# Patient Record
Sex: Female | Born: 1945 | Race: White | Hispanic: No | Smoking: Former smoker
Health system: Southern US, Community
[De-identification: ages and names within clinical notes are randomized; demographics above are authoritative.]

## PROBLEM LIST (undated history)

## (undated) DIAGNOSIS — E119 Type 2 diabetes mellitus without complications: Secondary | ICD-10-CM

## (undated) DIAGNOSIS — K219 Gastro-esophageal reflux disease without esophagitis: Secondary | ICD-10-CM

## (undated) DIAGNOSIS — N6019 Diffuse cystic mastopathy of unspecified breast: Secondary | ICD-10-CM

## (undated) DIAGNOSIS — I491 Atrial premature depolarization: Secondary | ICD-10-CM

## (undated) DIAGNOSIS — N189 Chronic kidney disease, unspecified: Secondary | ICD-10-CM

## (undated) DIAGNOSIS — J439 Emphysema, unspecified: Secondary | ICD-10-CM

## (undated) DIAGNOSIS — Z9289 Personal history of other medical treatment: Secondary | ICD-10-CM

## (undated) DIAGNOSIS — I10 Essential (primary) hypertension: Secondary | ICD-10-CM

## (undated) DIAGNOSIS — R002 Palpitations: Secondary | ICD-10-CM

## (undated) DIAGNOSIS — I499 Cardiac arrhythmia, unspecified: Secondary | ICD-10-CM

## (undated) DIAGNOSIS — J309 Allergic rhinitis, unspecified: Secondary | ICD-10-CM

## (undated) DIAGNOSIS — Z87891 Personal history of nicotine dependence: Secondary | ICD-10-CM

## (undated) DIAGNOSIS — M1990 Unspecified osteoarthritis, unspecified site: Secondary | ICD-10-CM

## (undated) DIAGNOSIS — L409 Psoriasis, unspecified: Secondary | ICD-10-CM

## (undated) DIAGNOSIS — R0600 Dyspnea, unspecified: Secondary | ICD-10-CM

## (undated) DIAGNOSIS — E7800 Pure hypercholesterolemia, unspecified: Secondary | ICD-10-CM

## (undated) DIAGNOSIS — R011 Cardiac murmur, unspecified: Secondary | ICD-10-CM

## (undated) HISTORY — DX: Atrial premature depolarization: I49.1

## (undated) HISTORY — DX: Emphysema, unspecified: J43.9

## (undated) HISTORY — PX: DILATION AND CURETTAGE OF UTERUS: SHX78

## (undated) HISTORY — PX: CHOLECYSTECTOMY: SHX55

## (undated) HISTORY — PX: BACK SURGERY: SHX140

## (undated) HISTORY — DX: Personal history of nicotine dependence: Z87.891

## (undated) HISTORY — PX: OTHER SURGICAL HISTORY: SHX169

## (undated) HISTORY — PX: CERVICAL SPINE SURGERY: SHX589

## (undated) HISTORY — PX: SKIN GRAFT: SHX250

## (undated) HISTORY — DX: Type 2 diabetes mellitus without complications: E11.9

## (undated) HISTORY — DX: Allergic rhinitis, unspecified: J30.9

## (undated) HISTORY — DX: Essential (primary) hypertension: I10

## (undated) HISTORY — DX: Palpitations: R00.2

## (undated) HISTORY — PX: SPINAL CORD STIMULATOR BATTERY EXCHANGE: SHX6202

## (undated) HISTORY — DX: Pure hypercholesterolemia, unspecified: E78.00

## (undated) HISTORY — DX: Personal history of other medical treatment: Z92.89

## (undated) HISTORY — PX: MASTECTOMY: SHX3

## (undated) HISTORY — DX: Psoriasis, unspecified: L40.9

## (undated) HISTORY — DX: Gastro-esophageal reflux disease without esophagitis: K21.9

## (undated) HISTORY — PX: ABDOMINAL HYSTERECTOMY: SHX81

---

## 2005-01-09 ENCOUNTER — Inpatient Hospital Stay: Payer: Self-pay | Admitting: Family Medicine

## 2005-01-26 ENCOUNTER — Ambulatory Visit: Payer: Self-pay | Admitting: Family Medicine

## 2005-02-18 ENCOUNTER — Ambulatory Visit: Payer: Self-pay | Admitting: General Surgery

## 2005-02-25 ENCOUNTER — Ambulatory Visit: Payer: Self-pay | Admitting: General Surgery

## 2005-04-22 ENCOUNTER — Ambulatory Visit: Payer: Self-pay | Admitting: Family Medicine

## 2005-08-27 ENCOUNTER — Ambulatory Visit: Payer: Self-pay

## 2005-09-16 ENCOUNTER — Ambulatory Visit: Payer: Self-pay | Admitting: Specialist

## 2005-09-18 ENCOUNTER — Ambulatory Visit: Payer: Self-pay | Admitting: Gastroenterology

## 2006-06-01 ENCOUNTER — Encounter: Payer: Self-pay | Admitting: Family Medicine

## 2006-06-19 ENCOUNTER — Encounter: Payer: Self-pay | Admitting: Family Medicine

## 2006-07-20 ENCOUNTER — Encounter: Payer: Self-pay | Admitting: Family Medicine

## 2006-08-19 ENCOUNTER — Encounter: Payer: Self-pay | Admitting: Family Medicine

## 2006-09-19 ENCOUNTER — Encounter: Payer: Self-pay | Admitting: Family Medicine

## 2006-12-23 ENCOUNTER — Ambulatory Visit: Payer: Self-pay | Admitting: Family Medicine

## 2007-08-17 ENCOUNTER — Ambulatory Visit: Payer: Self-pay | Admitting: Family Medicine

## 2007-12-02 IMAGING — US ABDOMEN ULTRASOUND
1 series · 17 of 25 positions shown · non-contrast
Comparison: none

REASON FOR EXAM: epigastric pain mid abdominal palpable mass pt can feel
COMMENTS:

[Series 1: abdomen ultrasound · 17 of 39 slices shown]
[im 1/39]
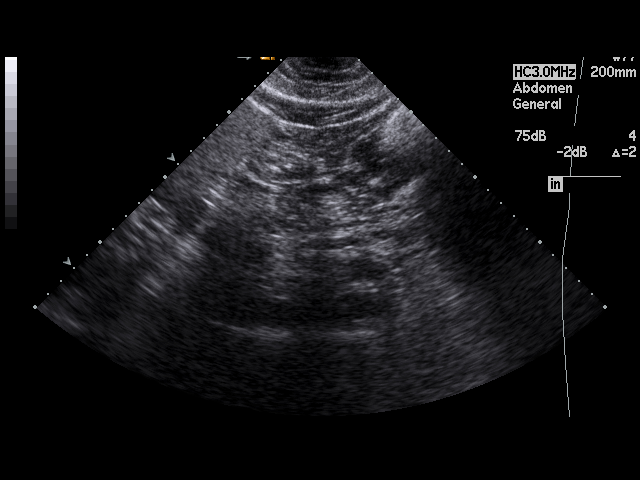
[im 4/39]
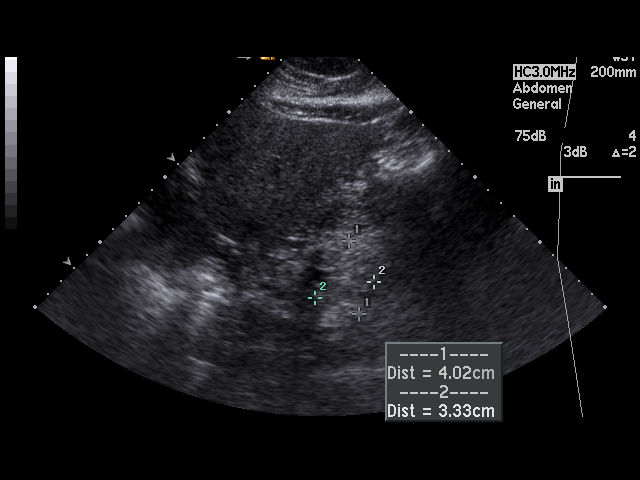
[im 5/39]
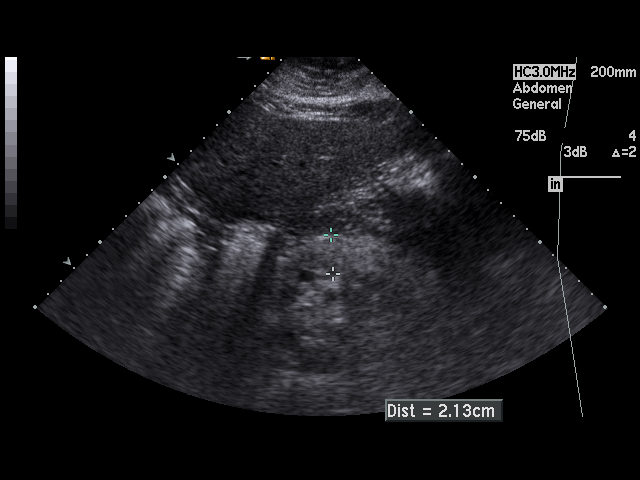
[im 8/39]
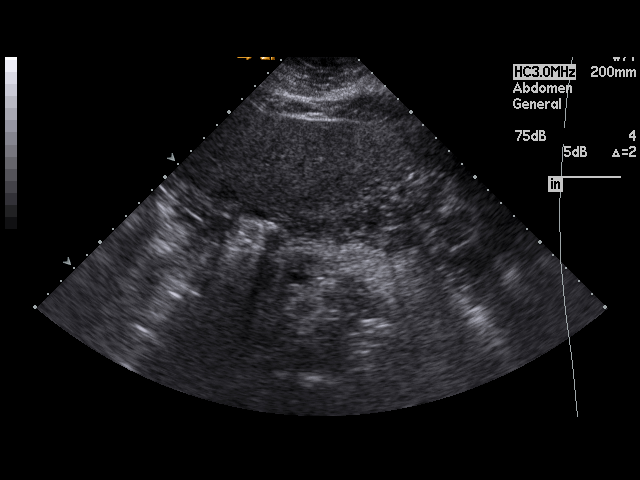
[im 10/39]
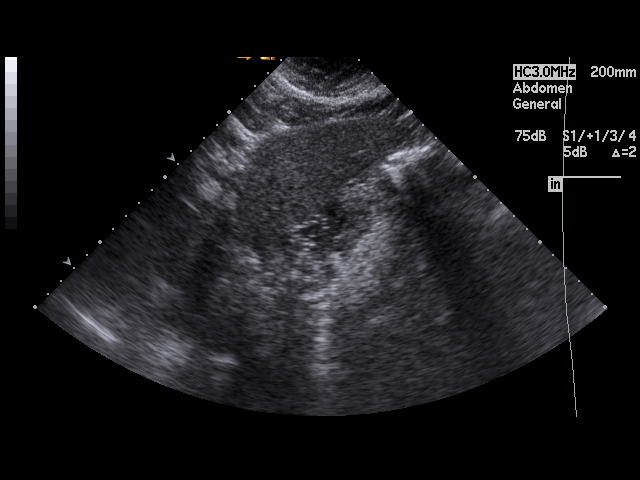
[im 13/39]
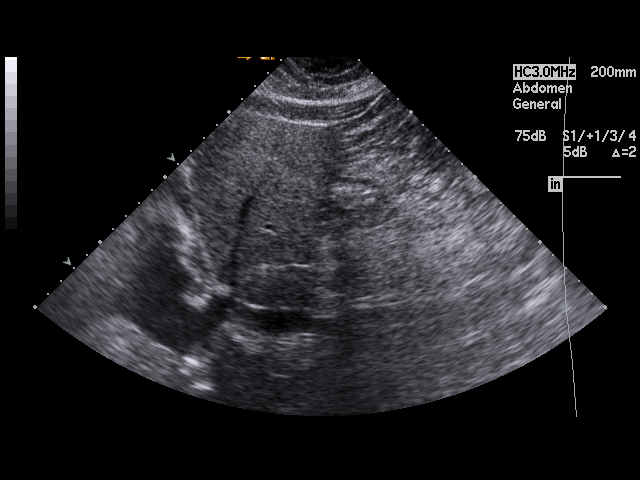
[im 15/39]
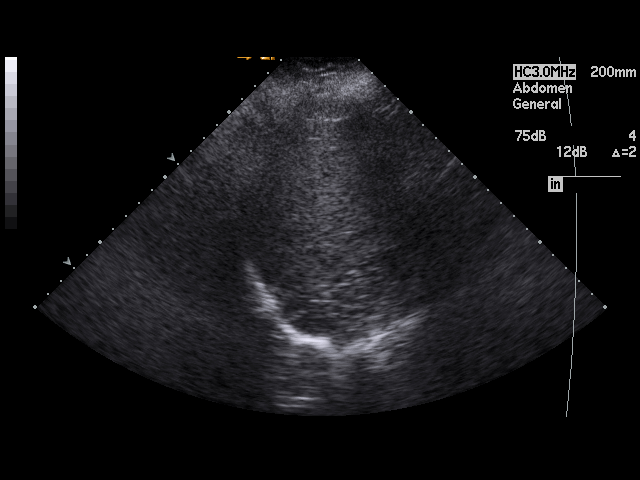
[im 18/39]
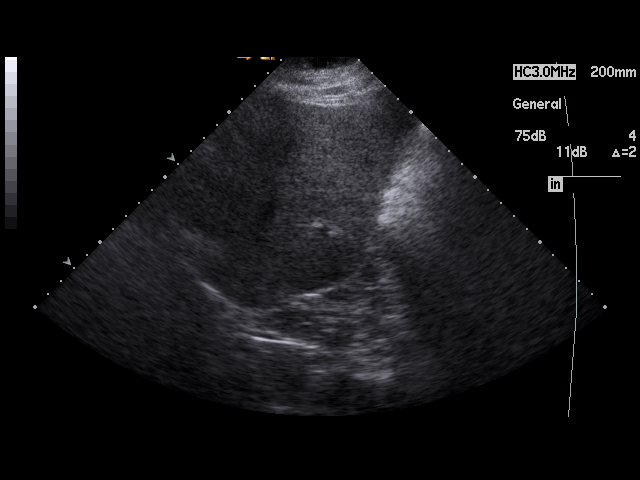
[im 20/39]
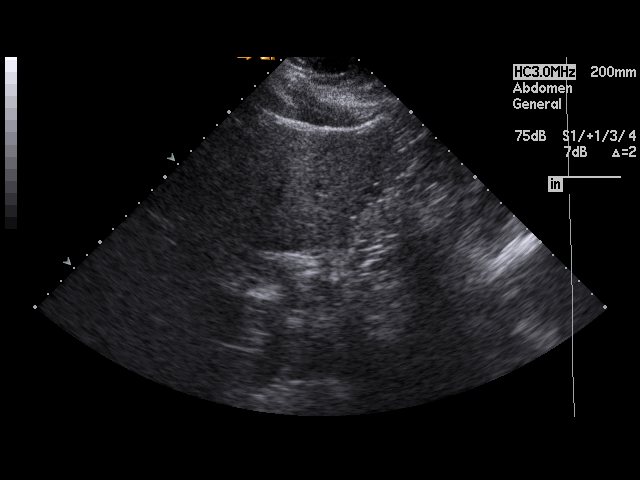
[im 21/39]
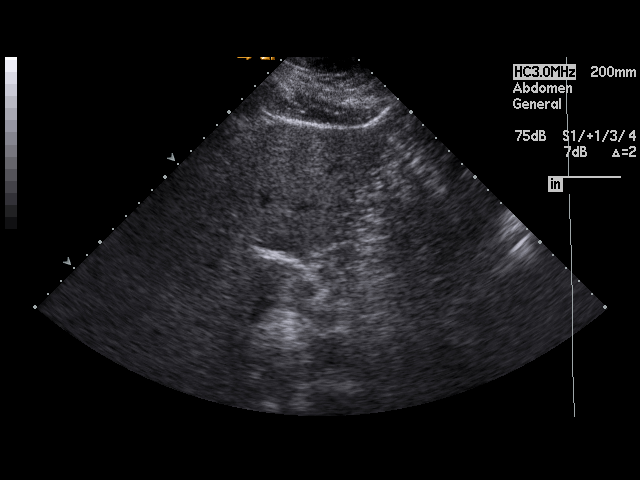
[im 24/39]
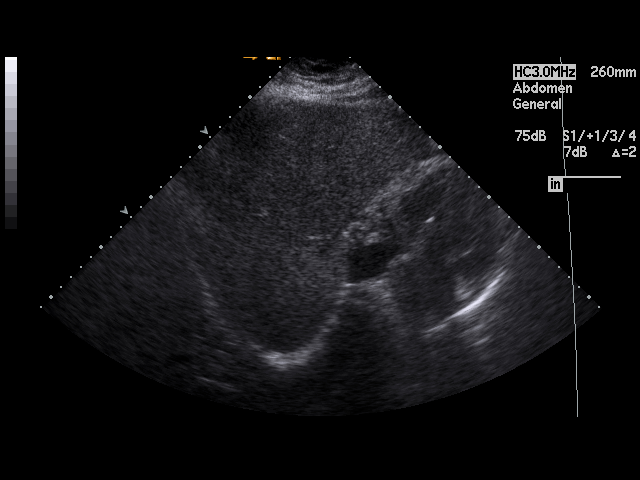
[im 26/39]
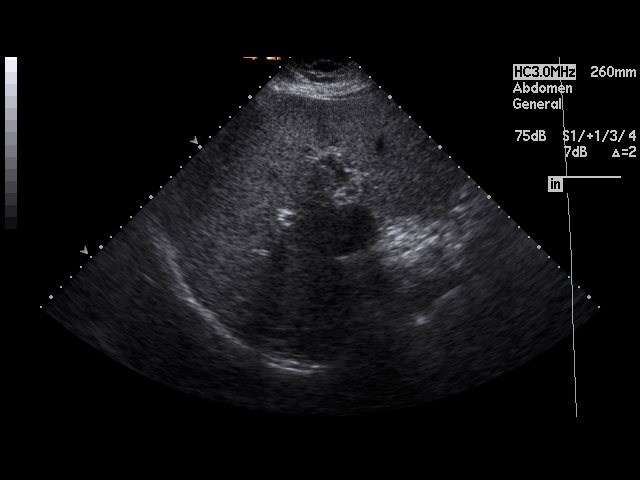
[im 29/39]
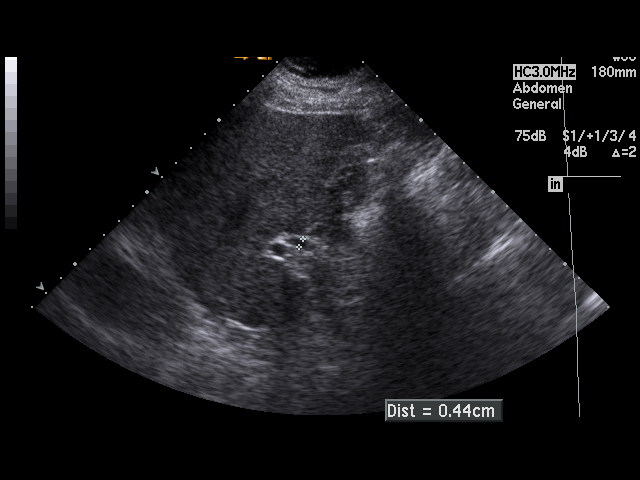
[im 31/39]
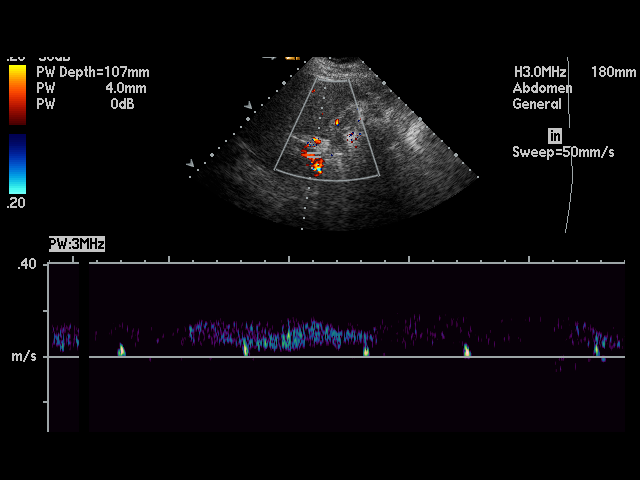
[im 34/39]
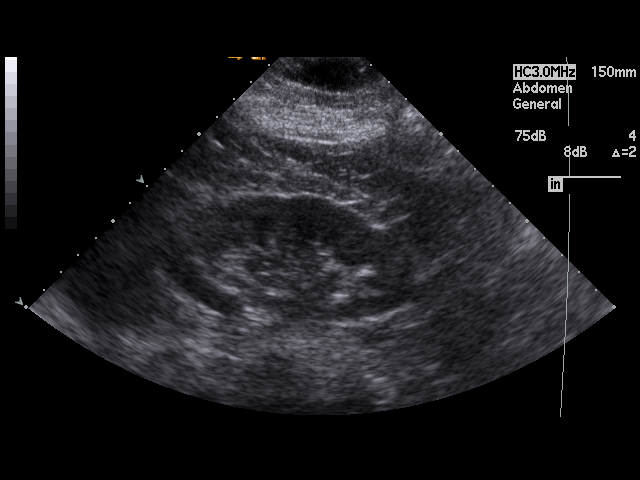
[im 35/39]
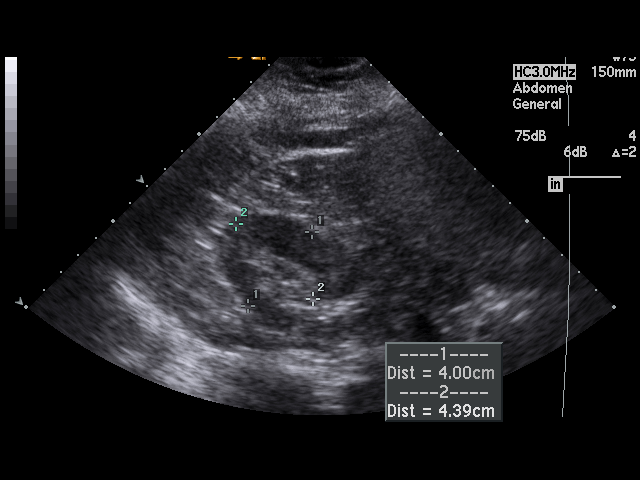
[im 39/39]
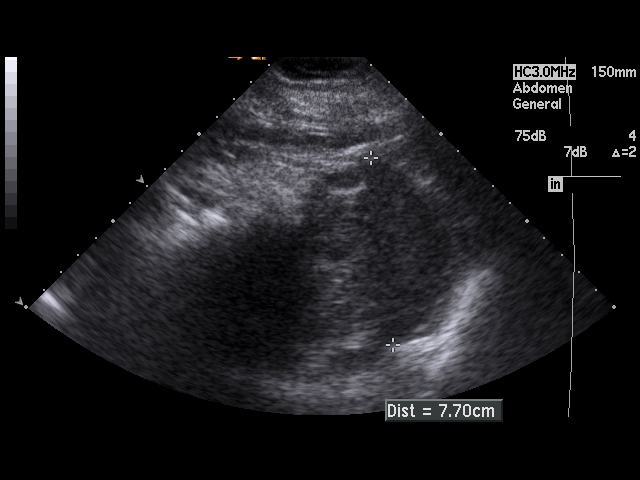

[17 of 25 positions shown; findings below may reference images not displayed]

PROCEDURE:     US  - US ABDOMEN GENERAL SURVEY  - August 27, 2005  [DATE]

RESULT:     The liver and spleen are normal in appearance. The pancreas is
plump, particularly the pancreatic head, which measures approximately
cm in AP diameter.  This value is similar to that observed on a prior
ultrasound exam of 01-12-05.  Recurrent pancreatitis would be the primary
consideration.  Additional evaluation by CT might be helpful if clinically
warranted. No pseudocyst formation is seen. No ascites is identified. The
patient is status post cholecystectomy.  The common bile duct measures
mm in diameter, which is within normal limits.  The kidneys show no
hydronephrosis. The abdominal aorta is normal in appearance.
IMPRESSION: 1)There is prominence of the pancreas, particularly the pancreatic head.
Similar findings were present on a prior exam of 01-12-05.  Recurrent
pancreatitis would be the first consideration, but conceivably, a pancreatic
head mass cannot be ruled out and additional evaluation by CT might be
helpful if such is clinically indicated.

2)There is no ascites.

3)The patient is status post cholecystectomy.

## 2009-06-24 ENCOUNTER — Ambulatory Visit: Payer: Self-pay | Admitting: Family Medicine

## 2009-07-16 ENCOUNTER — Ambulatory Visit: Payer: Self-pay | Admitting: Specialist

## 2010-02-11 ENCOUNTER — Ambulatory Visit: Payer: Self-pay | Admitting: Family Medicine

## 2010-04-23 ENCOUNTER — Other Ambulatory Visit: Payer: Self-pay | Admitting: Internal Medicine

## 2010-04-24 ENCOUNTER — Ambulatory Visit: Payer: Self-pay | Admitting: Internal Medicine

## 2013-03-30 ENCOUNTER — Ambulatory Visit: Payer: Self-pay | Admitting: Ophthalmology

## 2013-03-30 LAB — POTASSIUM: Potassium: 4.4 mmol/L

## 2013-04-03 ENCOUNTER — Ambulatory Visit: Payer: Self-pay | Admitting: Ophthalmology

## 2013-04-24 ENCOUNTER — Ambulatory Visit: Payer: Self-pay | Admitting: Ophthalmology

## 2014-06-28 ENCOUNTER — Encounter (INDEPENDENT_AMBULATORY_CARE_PROVIDER_SITE_OTHER): Payer: Self-pay

## 2014-06-28 ENCOUNTER — Encounter: Payer: Self-pay | Admitting: Internal Medicine

## 2014-06-28 ENCOUNTER — Ambulatory Visit (INDEPENDENT_AMBULATORY_CARE_PROVIDER_SITE_OTHER): Payer: Medicare Other | Admitting: Internal Medicine

## 2014-06-28 VITALS — BP 146/80 | HR 97 | Temp 98.7°F | Ht 65.0 in | Wt 232.0 lb

## 2014-06-28 DIAGNOSIS — J449 Chronic obstructive pulmonary disease, unspecified: Secondary | ICD-10-CM | POA: Insufficient documentation

## 2014-06-28 DIAGNOSIS — J9611 Chronic respiratory failure with hypoxia: Secondary | ICD-10-CM | POA: Diagnosis not present

## 2014-06-28 DIAGNOSIS — J439 Emphysema, unspecified: Secondary | ICD-10-CM | POA: Insufficient documentation

## 2014-06-28 DIAGNOSIS — J9610 Chronic respiratory failure, unspecified whether with hypoxia or hypercapnia: Secondary | ICD-10-CM | POA: Insufficient documentation

## 2014-06-28 NOTE — Patient Instructions (Addendum)
Follow up with Dr. Dema SeverinMungal in 1 month - pulmonary function testing and 6 minute walk test prior to follow up - 2 view chest xray for COPD and shortness of breath - continue with 3L oxygen at all times, Spiriva, Pulmicort nebulizer and rescue inhaler  - continue with exercise as tolerated.

## 2014-06-28 NOTE — Progress Notes (Signed)
Date: 06/28/2014  MRN# [id number] Julie Keith 11-Nov-1945  Referring Physician: Dr. Magdalene Keith  Julie Keith is a 69 y.o. old female seen in consultation for  COPD management.   CC:  Chief Complaint  Patient presents with  . Advice Only    Pt referred by Dr. Thana Keith: Pt is is on 3 L Pulse x 7 yrs.    HPI:  Patient is a pleasant 69 year old female presents today for COPD management transitioning care from her prior pulmonologist, Dr. Mayo Keith. Patient states she was diagnosed with COPD 9 years ago. She usually has 1 bronchitis episode per year, that happened winter or spring, whether changes definitely exacerbate her COPD. Current medications include: 3 L oxygen pulse dose, Spiriva, Xopenex inhaler as a rescue meds, albuterol nebulizer as a rescue med, Pulmicort nebulizer twice a day. Patient was a former smoker, quit in 2007, previously smoked 2.5 packs per day for 25 years. She does have a pet to get home. 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 Past medical history of COPD, psoriasis, diabetes, hypertension, chronic lower extremity edema. Typical COPD symptoms include dyspnea on exertion,mild increase in sputum production. Symptoms are exacerbated by weather changes, relieved with inhaler use a nebulizer use along with supplemental oxygen. Last bronchitis episode was in November 2015. Currently patient states that her breathing is at baseline.   PMHX:   Past Medical History  Diagnosis Date  . Hypertension   . Diabetes   . Hypercholesteremia   . Emphysema lung   . Allergic rhinitis    Surgical Hx:  Past Surgical History  Procedure Laterality Date  . Cholecystectomy    . Cervical spine surgery    . Abdominal hysterectomy    . Mastectomy Bilateral    Family Hx:  Family History  Problem Relation Age of Onset  . Leukemia Mother   . Pneumonia Father   . COPD Father    Social Hx:   History  Substance Use Topics  . Smoking status:  Former Smoker -- 2.00 packs/day for 30 years    Types: Cigarettes  . Smokeless tobacco: Never Used     Comment: quit 2006  . Alcohol Use: No   Medication:   Current Outpatient Rx  Name  Route  Sig  Dispense  Refill  . albuterol (PROVENTIL HFA;VENTOLIN HFA) 108 (90 BASE) MCG/ACT inhaler   Inhalation   Inhale 2 puffs into the lungs every 6 (six) hours as needed for wheezing or shortness of breath.         . Apremilast 30 MG TABS   Oral   Take 30 mg by mouth 2 (two) times daily.         . budesonide (PULMICORT) 0.5 MG/2ML nebulizer solution   Nebulization   Take 0.5 mg by nebulization 2 (two) times daily.         . enalapril (VASOTEC) 20 MG tablet   Oral   Take 20 mg by mouth 2 (two) times daily.      1   . fexofenadine (ALLEGRA) 60 MG tablet   Oral   Take 60 mg by mouth 2 (two) times daily.         Marland Kitchen. glimepiride (AMARYL) 2 MG tablet   Oral   Take 2 mg by mouth daily. Take 1/2 tablet daily      1   . loratadine (CLARITIN) 10 MG tablet   Oral   Take 10 mg by mouth daily.         .Marland Kitchen  omeprazole (PRILOSEC) 20 MG capsule   Oral   Take 20 mg by mouth daily.      2   . pravastatin (PRAVACHOL) 40 MG tablet   Oral   Take 40 mg by mouth daily.         Marland Kitchen tiotropium (SPIRIVA) 18 MCG inhalation capsule   Inhalation   Place 18 mcg into inhaler and inhale daily.         Marland Kitchen torsemide (DEMADEX) 20 MG tablet   Oral   Take 20 mg by mouth 3 (three) times daily.             Allergies:  Review of patient's allergies indicates no known allergies.  Review of Systems: Gen:  Denies  fever, sweats, chills HEENT: Denies blurred vision, double vision, ear pain, eye pain, hearing loss, nose bleeds, sore throat Cvc:  No dizziness, chest pain or heaviness Resp:   Denies cough or sputum porduction, shortness of breath Gi: Denies swallowing difficulty, stomach pain, nausea or vomiting, diarrhea, constipation, bowel incontinence Gu:  Denies bladder incontinence, burning  urine Ext:   No Joint pain, stiffness or swelling Skin: No skin rash, easy bruising or bleeding or hives Endoc:  No polyuria, polydipsia , polyphagia or weight change Psych: No depression, insomnia or hallucinations  Other:  All other systems negative  Physical Examination:   VS: BP 146/80 mmHg  Pulse 97  Temp(Src) 98.7 F (37.1 C) (Oral)  Ht  (1.651 m)  Wt 232 lb (105.235 kg)  BMI 38.61 kg/m2  SpO2 97%  General Appearance: No distress  Neuro:without focal findings, mental status, speech normal, alert and oriented, cranial nerves 2-12 intact, reflexes normal and symmetric, sensation grossly normal  HEENT: PERRLA, EOM intact, no ptosis, no other lesions noticed; Mallampati 2 Pulmonary: moderate respiratory effort, no wheezes noted bilaterally, decreased breath sounds at the bilateral bases, coarse upper airway sounds with transmission to the other lung fields. Sputum production: None CardiovascularNormal S1,S2.  No m/r/g.  Abdominal aorta pulsation normal.    Abdomen: Benign, Soft, non-tender, No masses, hepatosplenomegaly, No lymphadenopathy Renal:  No costovertebral tenderness  GU:  No performed at this time. Endoc: No evident thyromegaly, no signs of acromegaly or Cushing features Skin:   warm, no rashes, no ecchymosis  Extremities: normal, no cyanosis, clubbing, warm with normal capillary refill. Other findings:bilateral 1+ lower extremity edema   Rad results: (The following images and results were reviewed by Julie Keith). CXR 01/2010 RESULT: Comparison is made to the prior exam of 08/17/2007.  There is again noted an increase in density at the left base posteriorly compatible with pneumonia or atelectasis. The right lung field is clear. The heart size is normal. No pulmonary edema is seen. It is recommended the patient have additional follow-up examination and it is suggested that the views be obtained with the patient's arms above her head.  IMPRESSION:  1.  Persistent infiltrate or atelectasis at the left base. 2. Continued follow-up is recommended and it is suggested the patient's arms be positioned above her head on follow-up examinations.    EKG:     Other:   Assessment and Plan:69 year old female with known COPD presenting for establish care and optimization of COPD COPD (chronic obstructive pulmonary disease) COPD-severe type (class D) Current COPD regiment include: 3 L oxygen at all times, Spiriva, Pulmicort nebulizer twice a day, rescue inhaler ( Xopenex), rescue nebulizer (albuterol)  Plan: - pulmonary function testing and 6 minute walk test prior to follow up - 2  view chest xray for COPD and shortness of breath - continue with 3L oxygen at all times, Spiriva, Pulmicort nebulizer and rescue inhaler  - continue with exercise as tolerated.   Chronic respiratory failure Secondary to COPD. Currently on 3 L of oxygen continuously.     Updated Medication List Outpatient Encounter Prescriptions as of 06/28/2014  Medication Sig  . albuterol (PROVENTIL HFA;VENTOLIN HFA) 108 (90 BASE) MCG/ACT inhaler Inhale 2 puffs into the lungs every 6 (six) hours as needed for wheezing or shortness of breath.  . Apremilast 30 MG TABS Take 30 mg by mouth 2 (two) times daily.  . budesonide (PULMICORT) 0.5 MG/2ML nebulizer solution Take 0.5 mg by nebulization 2 (two) times daily.  . enalapril (VASOTEC) 20 MG tablet Take 20 mg by mouth 2 (two) times daily.  . fexofenadine (ALLEGRA) 60 MG tablet Take 60 mg by mouth 2 (two) times daily.  Marland Kitchen glimepiride (AMARYL) 2 MG tablet Take 2 mg by mouth daily. Take 1/2 tablet daily  . loratadine (CLARITIN) 10 MG tablet Take 10 mg by mouth daily.  Marland Kitchen omeprazole (PRILOSEC) 20 MG capsule Take 20 mg by mouth daily.  . pravastatin (PRAVACHOL) 40 MG tablet Take 40 mg by mouth daily.  Marland Kitchen tiotropium (SPIRIVA) 18 MCG inhalation capsule Place 18 mcg into inhaler and inhale daily.  Marland Kitchen torsemide (DEMADEX) 20 MG tablet Take 20 mg  by mouth 3 (three) times daily.    Orders for this visit: Orders Placed This Encounter  Procedures  . DG Chest 2 View    Standing Status: Future     Number of Occurrences:      Standing Expiration Date: 08/28/2015    Scheduling Instructions:     Close to 1 month f/u    Order Specific Question:  Reason for Exam (SYMPTOM  OR DIAGNOSIS REQUIRED)    Answer:  copd    Order Specific Question:  Preferred imaging location?    Answer:  External     Comments:  [facility]  . Pulmonary function test    Standing Status: Future     Number of Occurrences:      Standing Expiration Date: 06/28/2015    Order Specific Question:  Where should this test be performed?    Answer:  [facility]    Order Specific Question:  Full PFT: includes the following: basic spirometry, spirometry pre & post bronchodilator, diffusion capacity (DLCO), lung volumes    Answer:  Full PFT    Order Specific Question:  MIP/MEP    Answer:  No    Order Specific Question:  6 minute walk    Answer:  Yes    Order Specific Question:  ABG    Answer:  No    Order Specific Question:  Diffusion capacity (DLCO)    Answer:  No    Order Specific Question:  Lung volumes    Answer:  No    Order Specific Question:  Methacholine challenge    Answer:  No     Thank  you for the consultation and for allowing [facility] to assist in the care of your patient. Our recommendations are noted above.  Please contact us if we can be of further service.   Stephanie Acre, MD [facility] Office Number: [phone]

## 2014-07-09 NOTE — Assessment & Plan Note (Signed)
COPD-severe type (class D) Current COPD regiment include: 3 L oxygen at all times, Spiriva, Pulmicort nebulizer twice a day, rescue inhaler ( Xopenex), rescue nebulizer (albuterol)  Plan: - pulmonary function testing and 6 minute walk test prior to follow up - 2 view chest xray for COPD and shortness of breath - continue with 3L oxygen at all times, Spiriva, Pulmicort nebulizer and rescue inhaler  - continue with exercise as tolerated.

## 2014-07-09 NOTE — Assessment & Plan Note (Signed)
Secondary to COPD. Currently on 3 L of oxygen continuously.

## 2014-07-23 ENCOUNTER — Ambulatory Visit
Admit: 2014-07-23 | Disposition: A | Discharge status: Home / Self Care | Payer: Self-pay | Attending: Internal Medicine | Admitting: Internal Medicine

## 2014-07-30 ENCOUNTER — Other Ambulatory Visit: Payer: Self-pay | Admitting: *Deleted

## 2014-07-30 DIAGNOSIS — J9611 Chronic respiratory failure with hypoxia: Secondary | ICD-10-CM

## 2014-07-30 DIAGNOSIS — J449 Chronic obstructive pulmonary disease, unspecified: Secondary | ICD-10-CM

## 2014-08-02 ENCOUNTER — Ambulatory Visit (INDEPENDENT_AMBULATORY_CARE_PROVIDER_SITE_OTHER): Payer: Medicare Other | Admitting: Internal Medicine

## 2014-08-02 ENCOUNTER — Ambulatory Visit: Payer: Medicare Other

## 2014-08-02 ENCOUNTER — Encounter: Payer: Self-pay | Admitting: Internal Medicine

## 2014-08-02 VITALS — BP 138/82 | HR 101 | Ht 65.0 in | Wt 233.0 lb

## 2014-08-02 DIAGNOSIS — J9611 Chronic respiratory failure with hypoxia: Secondary | ICD-10-CM | POA: Diagnosis not present

## 2014-08-02 DIAGNOSIS — R0602 Shortness of breath: Secondary | ICD-10-CM

## 2014-08-02 DIAGNOSIS — J449 Chronic obstructive pulmonary disease, unspecified: Secondary | ICD-10-CM | POA: Diagnosis not present

## 2014-08-02 LAB — PULMONARY FUNCTION TEST
DL/VA % pred: 116 %
DL/VA: 5.72 ml/min/mmHg/L
DLCO unc % pred: 88 %
DLCO unc: 22.61 ml/min/mmHg
FEF 25-75 PRE: 0.73 L/s
FEF 25-75 Post: 1.07 L/sec
FEF2575-%CHANGE-POST: 46 %
FEF2575-%Pred-Post: 55 %
FEF2575-%Pred-Pre: 37 %
FEV1-%Change-Post: 15 %
FEV1-%PRED-PRE: 51 %
FEV1-%Pred-Post: 58 %
FEV1-PRE: 1.2 L
FEV1-Post: 1.39 L
FEV1FVC-%Change-Post: 8 %
FEV1FVC-%PRED-PRE: 83 %
FEV6-%Change-Post: 6 %
FEV6-%Pred-Post: 68 %
FEV6-%Pred-Pre: 64 %
FEV6-POST: 2.03 L
FEV6-Pre: 1.91 L
FEV6FVC-%PRED-PRE: 105 %
FEV6FVC-%Pred-Post: 105 %
FVC-%CHANGE-POST: 6 %
FVC-%PRED-POST: 65 %
FVC-%Pred-Pre: 61 %
FVC-Post: 2.03 L
FVC-Pre: 1.91 L
POST FEV1/FVC RATIO: 68 %
Post FEV6/FVC ratio: 100 %
Pre FEV1/FVC ratio: 63 %
Pre FEV6/FVC Ratio: 100 %
RV % pred: 118 %
RV: 2.67 L
TLC % pred: 91 %
TLC: 4.76 L

## 2014-08-02 MED ORDER — TIOTROPIUM BROMIDE MONOHYDRATE 2.5 MCG/ACT IN AERS: 2.0000 | INHALATION_SPRAY | Freq: Every day | RESPIRATORY_TRACT | Status: DC

## 2014-08-02 MED ORDER — ALBUTEROL SULFATE HFA 108 (90 BASE) MCG/ACT IN AERS: 2.0000 | INHALATION_SPRAY | Freq: Four times a day (QID) | RESPIRATORY_TRACT | Status: DC | PRN

## 2014-08-02 NOTE — Progress Notes (Signed)
SMW performed today. 

## 2014-08-02 NOTE — Progress Notes (Signed)
MRN# [id number] Julie Keith [birth year]   CC: Chief Complaint  Patient presents with  . Follow-up    PFT results; Breathing has been good since last visit      Brief History: 06/28/14 HPI:  Patient is a pleasant 69 year old female presents today for COPD management transitioning care from her prior pulmonologist, Dr. Mayo Ao. Patient states she was diagnosed with COPD 9 years ago. She usually has 1 bronchitis episode per year, that happened winter or spring, whether changes definitely exacerbate her COPD. Current medications include: 3 L oxygen pulse dose, Spiriva, Xopenex inhaler as a rescue meds, albuterol nebulizer as a rescue med, Pulmicort nebulizer twice a day. Patient was a former smoker, quit in 2007, previously smoked 2.5 packs per day for 25 years. She does have a pet to get home. 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 Past medical history of COPD, psoriasis, diabetes, hypertension, chronic lower extremity edema. Typical COPD symptoms include dyspnea on exertion,mild increase in sputum production. Symptoms are exacerbated by weather changes, relieved with inhaler use a nebulizer use along with supplemental oxygen. Last bronchitis episode was in November 2015. Currently patient states that her breathing is at baseline. Plan - cont with 3L O2 via [place], cxr, pft,   Events since last clinic visit:  patient is a pleasant 69 year old female presents today for follow-up visit of her COPD. At her last visit it was discussed that she would obtain a 6 minute walk test, primary function testing, and a chest x-ray. Since her last visit she has no further complaints of worsening shortness of breath, cough, runny nose, sputum production. She states that she has been out of Spiriva for the past 1.5 years and would like to be started back on it or given an alternative.     PMHX:   Past Medical History  Diagnosis Date  . Hypertension   . Diabetes    . Hypercholesteremia   . Emphysema lung   . Allergic rhinitis    Surgical Hx:  Past Surgical History  Procedure Laterality Date  . Cholecystectomy    . Cervical spine surgery    . Abdominal hysterectomy    . Mastectomy Bilateral    Family Hx:  Family History  Problem Relation Age of Onset  . Leukemia Mother   . Pneumonia Father   . COPD Father    Social Hx:   History  Substance Use Topics  . Smoking status: Former Smoker -- 2.00 packs/day for 30 years    Types: Cigarettes  . Smokeless tobacco: Never Used     Comment: quit 2006  . Alcohol Use: No   Medication:   Current Outpatient Rx  Name  Route  Sig  Dispense  Refill  . albuterol (PROVENTIL HFA;VENTOLIN HFA) 108 (90 BASE) MCG/ACT inhaler   Inhalation   Inhale 2 puffs into the lungs every 6 (six) hours as needed for wheezing or shortness of breath.         . Apremilast 30 MG TABS   Oral   Take 30 mg by mouth 2 (two) times daily.         . budesonide (PULMICORT) 0.5 MG/2ML nebulizer solution   Nebulization   Take 0.5 mg by nebulization 2 (two) times daily.         . enalapril (VASOTEC) 20 MG tablet   Oral   Take 20 mg by mouth 2 (two) times daily.      1   . fexofenadine (  ALLEGRA) 60 MG tablet   Oral   Take 60 mg by mouth 2 (two) times daily.         Marland Kitchen glimepiride (AMARYL) 2 MG tablet   Oral   Take 2 mg by mouth daily. Take 1/2 tablet daily      1   . loratadine (CLARITIN) 10 MG tablet   Oral   Take 10 mg by mouth daily.         Marland Kitchen omeprazole (PRILOSEC) 20 MG capsule   Oral   Take 20 mg by mouth daily.      2   . pravastatin (PRAVACHOL) 40 MG tablet   Oral   Take 40 mg by mouth daily.         Marland Kitchen tiotropium (SPIRIVA) 18 MCG inhalation capsule   Inhalation   Place 18 mcg into inhaler and inhale daily.         Marland Kitchen torsemide (DEMADEX) 20 MG tablet   Oral   Take 20 mg by mouth 3 (three) times daily.            Review of Systems: Gen:  Denies  fever, sweats, chills HEENT:  Denies blurred vision, double vision, ear pain, eye pain, hearing loss, nose bleeds, sore throat Cvc:  No dizziness, chest pain or heaviness Resp:   Chronic shortness of breath, not worse Gi: Denies swallowing difficulty, stomach pain, nausea or vomiting, diarrhea, constipation, bowel incontinence Gu:  Denies bladder incontinence, burning urine Ext:   No Joint pain, stiffness or swelling Skin: No skin rash, easy bruising or bleeding or hives Endoc:  No polyuria, polydipsia , polyphagia or weight change Psych: No depression, insomnia or hallucinations  Other:  All other systems negative  Allergies:  Review of patient's allergies indicates no known allergies.  Physical Examination:  VS: BP 138/82 mmHg  Pulse 101  Ht  (1.651 m)  Wt 233 lb (105.688 kg)  BMI 38.77 kg/m2  SpO2 97%  General Appearance: No distress  HEENT: PERRLA, EOM intact, no ptosis, no other lesions noticed Pulmonary:Exam: normal breath sounds., diaphragmatic excursion normal.No wheezing, No rales   Cardiovascular:@ Exam:  Normal S1,S2.  No m/r/g.     Abdomen:Exam: Benign, Soft, non-tender, No masses  Skin:   warm, no rashes, no ecchymosis  Extremities: normal, no cyanosis, clubbing, no edema, warm with normal capillary refill.   Labs results:  BMP No results found for: NA, K, CL, CO2, GLUCOSE, BUN, CREATININE   CBC No flowsheet data found.   Rad results: (The following images and results were reviewed by Dr. Dema Severin). CXR 07/23/14 CHEST  2 VIEW  COMPARISON:  PA and lateral chest of February 11, 2010  FINDINGS: The lungs are mildly hyperinflated and clear. The heart and pulmonary vascularity are normal. The mediastinum is normal in width. There is no pleural effusion. The trachea is midline. The bony thorax is unremarkable.   IMPRESSION: COPD. There is no pneumonia nor other active cardiopulmonary disease.  Pulmonary function testing 08/02/2014 FEV1 51% FEV1/FVC 63 RV N2 103 TLC N2 75 RV/TLC  133  DLCO 88 Impression: Moderate to severe obstruction, positive change in FEV1 from bronchodilator (15%), no restriction, normal DLCO. 6 minute walk test: 264 m/866 feet, patient walked with 3 L of oxygen via nasal cannula, no desaturations below 95%, highest heart rate 135  Assessment and Plan:69 year old female past medical history of chronic hypoxic respiratory failure, COPD gold stage II, presenting for follow-up visit of COPD. No problem-specific assessment & plan notes found  for this encounter.   Updated Medication List Outpatient Encounter Prescriptions as of 08/02/2014  Medication Sig  . albuterol (PROVENTIL HFA;VENTOLIN HFA) 108 (90 BASE) MCG/ACT inhaler Inhale 2 puffs into the lungs every 6 (six) hours as needed for wheezing or shortness of breath.  . Apremilast 30 MG TABS Take 30 mg by mouth 2 (two) times daily.  . budesonide (PULMICORT) 0.5 MG/2ML nebulizer solution Take 0.5 mg by nebulization 2 (two) times daily.  . enalapril (VASOTEC) 20 MG tablet Take 20 mg by mouth 2 (two) times daily.  . fexofenadine (ALLEGRA) 60 MG tablet Take 60 mg by mouth 2 (two) times daily.  Marland Kitchen. glimepiride (AMARYL) 2 MG tablet Take 2 mg by mouth daily. Take 1/2 tablet daily  . loratadine (CLARITIN) 10 MG tablet Take 10 mg by mouth daily.  Marland Kitchen. omeprazole (PRILOSEC) 20 MG capsule Take 20 mg by mouth daily.  . pravastatin (PRAVACHOL) 40 MG tablet Take 40 mg by mouth daily.  Marland Kitchen. tiotropium (SPIRIVA) 18 MCG inhalation capsule Place 18 mcg into inhaler and inhale daily.  Marland Kitchen. torsemide (DEMADEX) 20 MG tablet Take 20 mg by mouth 3 (three) times daily.    Orders for this visit: No orders of the defined types were placed in this encounter.    Thank  you for the visitation and for allowing  [facility] to assist in the care of your patient. Our recommendations are noted above.  Please contact us if we can be of further service.  Stephanie AcreVishal Melanie Openshaw, MD [facility] Office  Number: [phone]

## 2014-08-02 NOTE — Patient Instructions (Signed)
Follow up in 3 months with Dr. Dema SeverinMungal - you have Stage 2 COPD requiring 3L of O2 continuously - continue with Pulmicort nebulizers and albuterol nebulizer - continue with Spiriva Respimat - sample given today.  - continue with 3L of O2 continuously

## 2014-08-02 NOTE — Progress Notes (Signed)
PFT performed today. 

## 2014-08-07 NOTE — Assessment & Plan Note (Signed)
COPD-Gold stage II Current COPD regiment include: 3 L oxygen at all times, Spiriva, Pulmicort nebulizer twice a day, rescue inhaler ( Xopenex), rescue nebulizer (albuterol)  Plan: - continue with 3L oxygen at all times, Spiriva, Pulmicort nebulizer and rescue inhaler  - continue with exercise as tolerated.

## 2014-08-07 NOTE — Assessment & Plan Note (Signed)
Secondary to COPD. Currently on 3 L of oxygen continuously.

## 2014-08-10 NOTE — Op Note (Signed)
PATIENT NAME:  Julie BeetsBLALOCK, Darcel P MR#:  [id number] DATE OF BIRTH:  [birth year]  DATE OF PROCEDURE:  04/03/2013  PREOPERATIVE DIAGNOSIS: Cataract, right eye.   POSTOPERATIVE DIAGNOSIS: Cataract, right eye.  PROCEDURE PERFORMED: Extracapsular cataract extraction using phacoemulsification with placement of Alcon [id number], 24.0-diopter posterior chamber lens, serial number [id number].   SURGEON: Maylon PeppersSteven A. Katti Pelle, M.D.   ANESTHESIA: 4% lidocaine and 0.75% Marcaine a 50-50 mixture with 10 units/mL of HyoMax added, given as a peribulbar.   ANESTHESIOLOGIST: Dr. Randel PiggPolin.   COMPLICATIONS: None.   ESTIMATED BLOOD LOSS: Less than 1 mL.   DESCRIPTION OF PROCEDURE: The patient was brought to the operating room and given a peribulbar block.  The patient was then prepped and draped in the usual fashion.  The vertical rectus muscles were imbricated using 5-0 silk sutures.  These sutures were then clamped to the sterile drapes as bridle sutures.  A limbal peritomy was performed extending two clock hours and hemostasis was obtained with cautery.  A partial thickness scleral groove was made at the surgical limbus and dissected anteriorly in a lamellar dissection using an Alcon crescent knife.  The anterior chamber was entered superonasally with a Superblade and through the lamellar dissection with a 2.6 mm keratome.  DisCoVisc was used to replace the aqueous and a continuous tear capsulorrhexis was carried out.  Hydrodissection and hydrodelineation were carried out with balanced salt and a 27 gauge canula.  The nucleus was rotated to confirm the effectiveness of the hydrodissection.  Phacoemulsification was carried out using a divide-and-conquer technique.  Total ultrasound time was 1 minute and 9 seconds with an average power of  24.1%. CDE 28.78.  Irrigation/aspiration was used to remove the residual cortex.  DisCoVisc was used to inflate the capsule and the internal incision was enlarged to 3 mm with the  crescent knife.  The intraocular lens was folded and inserted into the capsular bag using the AcrySert delivery system.  Irrigation/aspiration was used to remove the residual DisCoVisc.  Miostat was injected into the anterior chamber through the paracentesis track to inflate the anterior chamber and induce miosis.  A tenth of a mL of cefuroxime was injected via the paracentesis track containing 1 mg of drug. The wound was checked for leaks and none were found. The conjunctiva was closed with cautery and the bridle sutures were removed.  Two drops of 0.3% Vigamox were placed on the eye.   An eye shield was placed on the eye.  The patient was discharged to the recovery room in good condition.    ____________________________ Maylon PeppersSteven A. Estephani Popper, MD sad:aw D: 04/03/2013 12:39:14 ET T: 04/03/2013 13:25:01 ET JOB#: [id number]  cc: Viviann SpareSteven A. Cesar Rogerson, MD, <Dictator> Erline LevineSTEVEN A Dezaray Shibuya MD ELECTRONICALLY SIGNED 04/17/2013 13:31

## 2014-08-11 NOTE — Op Note (Signed)
PATIENT NAME:  Julie Keith, Julie Keith MR#:  [id number] DATE OF BIRTH:  1945/05/12  DATE OF PROCEDURE:  04/24/2013  PREOPERATIVE DIAGNOSIS: Cataract, left eye.   POSTOPERATIVE DIAGNOSIS: Cataract, left eye.  PROCEDURE PERFORMED: Extracapsular cataract extraction using phacoemulsification with placement of Alcon [id number], 23.0 diopter posterior chamber lens, serial number [id number].   SURGEON: Maylon PeppersSteven A. Cy Bresee, M.D.   ANESTHESIA: 4% lidocaine and 0.75% Marcaine a 50-50 mixture with 10 units/mL of Hyalex added, given as a peribulbar.   ANESTHESIOLOGIST: Dr. Darleene CleaverVan Staveren.   COMPLICATIONS:  None.  ESTIMATED BLOOD LOSS:  Less than 1 mL.  DESCRIPTION OF PROCEDURE:  The patient was brought to the operating room and given a peribulbar block.  The patient was then prepped and draped in the usual fashion.  The vertical rectus muscles were imbricated using 5-0 silk sutures.  These sutures were then clamped to the sterile drapes as bridle sutures.  A limbal peritomy was performed extending two clock hours and hemostasis was obtained with cautery.  A partial thickness scleral groove was made at the surgical limbus and dissected anteriorly in a lamellar dissection using an Alcon crescent knife.  The anterior chamber was entered supero-temporally with a Superblade and through the lamellar dissection with a 2.6 mm keratome.  DisCoVisc was used to replace the aqueous and a continuous tear capsulorrhexis was carried out.  Hydrodissection and hydrodelineation were carried out with balanced salt and a 27 gauge canula.  The nucleus was rotated to confirm the effectiveness of the hydrodissection.  Phacoemulsification was carried out using a divide-and-conquer technique.  Total ultrasound time was 1 minute and 32 seconds with an average power of 23.9%. CDE of 33.75.  Irrigation/aspiration was used to remove the residual cortex.  DisCoVisc was used to inflate the capsule and the internal incision was enlarged to 3 mm  with the crescent knife.  The intraocular lens was folded and inserted into the capsular bag using the AcrySert delivery system.  Irrigation/aspiration was used to remove the residual DisCoVisc.  Miostat was injected into the anterior chamber through the paracentesis track to inflate the anterior chamber and induce miosis.  A tenth of a mL of cefuroxime was injected via the paracentesis track containing 1 mg of drug. The wound was checked for leaks and none were found. The conjunctiva was closed with cautery and the bridle sutures were removed.  Two drops of 0.3% Vigamox were placed on the eye.   An eye shield was placed on the eye.  The patient was discharged to the recovery room in good condition.  ____________________________ Maylon PeppersSteven A. Koraima Albertsen, MD sad:aw D: 04/24/2013 11:55:32 ET T: 04/24/2013 12:31:19 ET JOB#: [id number]  cc: Viviann SpareSteven A. Roza Creamer, MD, <Dictator> Erline LevineSTEVEN A Inri Sobieski MD ELECTRONICALLY SIGNED 05/01/2013 13:24

## 2014-08-30 ENCOUNTER — Encounter: Payer: Self-pay | Admitting: Internal Medicine

## 2014-09-24 ENCOUNTER — Encounter: Payer: Self-pay | Admitting: Internal Medicine

## 2014-10-15 ENCOUNTER — Other Ambulatory Visit: Payer: Self-pay

## 2014-10-31 ENCOUNTER — Ambulatory Visit (INDEPENDENT_AMBULATORY_CARE_PROVIDER_SITE_OTHER): Payer: Medicare Other | Admitting: Family Medicine

## 2014-10-31 ENCOUNTER — Encounter: Payer: Self-pay | Admitting: Family Medicine

## 2014-10-31 VITALS — BP 132/76 | HR 91 | Temp 98.3°F | Resp 16 | Ht 65.0 in | Wt 234.7 lb

## 2014-10-31 DIAGNOSIS — E669 Obesity, unspecified: Secondary | ICD-10-CM

## 2014-10-31 DIAGNOSIS — I10 Essential (primary) hypertension: Secondary | ICD-10-CM

## 2014-10-31 DIAGNOSIS — IMO0002 Reserved for concepts with insufficient information to code with codable children: Secondary | ICD-10-CM

## 2014-10-31 DIAGNOSIS — E1165 Type 2 diabetes mellitus with hyperglycemia: Secondary | ICD-10-CM

## 2014-10-31 DIAGNOSIS — E1121 Type 2 diabetes mellitus with diabetic nephropathy: Secondary | ICD-10-CM | POA: Insufficient documentation

## 2014-10-31 DIAGNOSIS — E119 Type 2 diabetes mellitus without complications: Secondary | ICD-10-CM | POA: Insufficient documentation

## 2014-10-31 DIAGNOSIS — J431 Panlobular emphysema: Secondary | ICD-10-CM | POA: Diagnosis not present

## 2014-10-31 DIAGNOSIS — E785 Hyperlipidemia, unspecified: Secondary | ICD-10-CM | POA: Diagnosis not present

## 2014-10-31 LAB — POCT GLYCOSYLATED HEMOGLOBIN (HGB A1C): Hemoglobin A1C: 7.5

## 2014-10-31 MED ORDER — TORSEMIDE 20 MG PO TABS: 20.0000 mg | ORAL_TABLET | Freq: Once | ORAL | Status: DC

## 2014-10-31 MED ORDER — PRAVASTATIN SODIUM 40 MG PO TABS: 40.0000 mg | ORAL_TABLET | Freq: Every day | ORAL | Status: DC

## 2014-10-31 MED ORDER — ENALAPRIL MALEATE 20 MG PO TABS: ORAL_TABLET | ORAL | Status: DC

## 2014-10-31 MED ORDER — GLIMEPIRIDE 2 MG PO TABS: 2.0000 mg | ORAL_TABLET | Freq: Every day | ORAL | Status: DC

## 2014-10-31 MED ORDER — LORATADINE 10 MG PO TABS: 10.0000 mg | ORAL_TABLET | Freq: Every day | ORAL | Status: DC

## 2014-10-31 MED ORDER — OMEPRAZOLE 20 MG PO CPDR: 20.0000 mg | DELAYED_RELEASE_CAPSULE | Freq: Every day | ORAL | Status: DC

## 2014-10-31 NOTE — Progress Notes (Signed)
Name: Julie Keith   MRN: [id number]    DOB: 1945-09-27   Date:10/31/2014       Progress Note  Subjective  Chief Complaint  Chief Complaint  Patient presents with  . Hypertension  . Diabetes  . COPD    Followed by [facility]  . Hyperlipidemia    Hypertension This is a chronic problem. The current episode started more than 1 year ago. The problem has been gradually improving since onset. The problem is controlled. Associated symptoms include malaise/fatigue and shortness of breath. Pertinent negatives include no blurred vision, chest pain, headaches, neck pain, orthopnea or palpitations. There are no associated agents to hypertension. Risk factors for coronary artery disease include diabetes mellitus, dyslipidemia, obesity, sedentary lifestyle, post-menopausal state and stress. Past treatments include ACE inhibitors and diuretics. The current treatment provides moderate improvement. There are no compliance problems.   Diabetes She presents for her follow-up diabetic visit. She has type 2 diabetes mellitus. Her disease course has been improving. Hypoglycemia symptoms include nervousness/anxiousness. Pertinent negatives for hypoglycemia include no dizziness, headaches, seizures or tremors. Associated symptoms include fatigue. Pertinent negatives for diabetes include no blurred vision, no chest pain, no weakness and no weight loss. Symptoms are improving. Risk factors for coronary artery disease include diabetes mellitus, dyslipidemia, hypertension, obesity, sedentary lifestyle, post-menopausal and stress. Current diabetic treatment includes oral agent (monotherapy). She is compliant with treatment all of the time. Her weight is stable. She is following a diabetic diet. She rarely participates in exercise. Her home blood glucose trend is decreasing steadily. Her overall blood glucose range is 110-130 mg/dl. An ACE inhibitor/angiotensin II receptor blocker is being taken.   Hyperlipidemia This is a chronic problem. The current episode started more than 1 year ago. The problem is controlled. Recent lipid tests were reviewed and are normal. Exacerbating diseases include diabetes and obesity. Factors aggravating her hyperlipidemia include fatty foods. Associated symptoms include shortness of breath. Pertinent negatives include no chest pain, focal weakness or myalgias. Current antihyperlipidemic treatment includes statins. The current treatment provides moderate improvement of lipids. There are no compliance problems.   Breathing Problem She complains of cough, difficulty breathing, shortness of breath and wheezing. There is no hemoptysis. This is a chronic problem. The current episode started more than 1 year ago. The problem occurs daily. The problem has been unchanged. Associated symptoms include dyspnea on exertion, malaise/fatigue and sneezing. Pertinent negatives include no chest pain, fever, headaches, heartburn, myalgias, sore throat or weight loss. Her symptoms are aggravated by URI and pollen. Her symptoms are alleviated by ipratropium and leukotriene antagonist. She reports moderate improvement on treatment. Her past medical history is significant for COPD.      Past Medical History  Diagnosis Date  . Hypertension   . Diabetes   . Hypercholesteremia   . Emphysema lung   . Allergic rhinitis     History  Substance Use Topics  . Smoking status: Former Smoker -- 2.00 packs/day for 30 years    Types: Cigarettes  . Smokeless tobacco: Never Used     Comment: quit 2006  . Alcohol Use: No     Current outpatient prescriptions:  .  albuterol (PROVENTIL HFA;VENTOLIN HFA) 108 (90 BASE) MCG/ACT inhaler, Inhale 2 puffs into the lungs every 6 (six) hours as needed for wheezing or shortness of breath., Disp: 1 Inhaler, Rfl: 2 .  Apremilast 30 MG TABS, Take 30 mg by mouth 2 (two) times daily., Disp: , Rfl:  .  budesonide (PULMICORT) 0.5 MG/2ML nebulizer  solution,  Take 0.5 mg by nebulization 2 (two) times daily., Disp: , Rfl:  .  enalapril (VASOTEC) 20 MG tablet, Taking 2 in the AM and 1 in the PM, Disp: 180 tablet, Rfl: 1 .  fexofenadine (ALLEGRA) 60 MG tablet, Take 60 mg by mouth 2 (two) times daily., Disp: , Rfl:  .  glimepiride (AMARYL) 2 MG tablet, Take 1 tablet (2 mg total) by mouth daily., Disp: 90 tablet, Rfl: 1 .  loratadine (CLARITIN) 10 MG tablet, Take 1 tablet (10 mg total) by mouth daily., Disp: 90 tablet, Rfl: 1 .  omeprazole (PRILOSEC) 20 MG capsule, Take 1 capsule (20 mg total) by mouth daily., Disp: 90 capsule, Rfl: 2 .  pravastatin (PRAVACHOL) 40 MG tablet, Take 1 tablet (40 mg total) by mouth daily., Disp: 90 tablet, Rfl: 1 .  Tiotropium Bromide Monohydrate (SPIRIVA RESPIMAT) 2.5 MCG/ACT AERS, Inhale 2 puffs into the lungs daily., Disp: 1 Inhaler, Rfl: 4 .  torsemide (DEMADEX) 20 MG tablet, Take 1 tablet (20 mg total) by mouth once., Disp: 90 tablet, Rfl: 1  No Known Allergies  Review of Systems  Constitutional: Positive for malaise/fatigue and fatigue. Negative for fever, chills and weight loss.  HENT: Positive for sneezing. Negative for congestion, hearing loss, sore throat and tinnitus.   Eyes: Negative for blurred vision, double vision and redness.  Respiratory: Positive for cough, shortness of breath and wheezing. Negative for hemoptysis.        Requiring less O2 usage  Cardiovascular: Positive for dyspnea on exertion. Negative for chest pain, palpitations, orthopnea, claudication and leg swelling.  Gastrointestinal: Negative for heartburn, nausea, vomiting, diarrhea, constipation and blood in stool.  Genitourinary: Negative for dysuria, urgency, frequency and hematuria.  Musculoskeletal: Positive for joint pain. Negative for myalgias, back pain, falls and neck pain.  Skin: Negative for itching.  Neurological: Negative for dizziness, tingling, tremors, focal weakness, seizures, loss of consciousness, weakness and headaches.   Endo/Heme/Allergies: Does not bruise/bleed easily.  Psychiatric/Behavioral: Negative for depression and substance abuse. The patient is nervous/anxious. The patient does not have insomnia.      Objective  Filed Vitals:   10/31/14 0740  BP: 132/76  Pulse: 91  Temp: 98.3 F (36.8 C)  Resp: 16  Height: 5\' 5"  (1.651 m)  Weight: 234 lb 11.2 oz (106.459 kg)  SpO2: 94%     Physical Exam  Constitutional: She is oriented to person, place, and time and well-developed, well-nourished, and in no distress.  Morbidly obese  HENT:  Head: Normocephalic.  Eyes: EOM are normal. Pupils are equal, round, and reactive to light.  Neck: Normal range of motion. No thyromegaly present.  Cardiovascular: Normal rate, regular rhythm and normal heart sounds.   No murmur heard. Pulmonary/Chest: Effort normal and breath sounds normal.  Diminished breath sounds with hyperresonance to percussion  Abdominal: Soft. Bowel sounds are normal.  Musculoskeletal: Normal range of motion. She exhibits no edema.  Neurological: She is alert and oriented to person, place, and time. No cranial nerve deficit. Gait normal.  Skin: Skin is warm and dry. No rash noted.  Psychiatric: Memory and affect normal.  Anxious and loquacious      Assessment & Plan   1. Diabetes type 2, uncontrolled Encouraged regular diet and exercise controlled - POCT HgB A1C  2. Essential hypertension Controlled - Comprehensive metabolic panel  3. Hyperlipidemia Lab today  - Lipid panel - TSH  4. Panlobular emphysema per pulmonologist  5. Obesity Admonished diet with exercise as tolerated and handout  was given

## 2014-10-31 NOTE — Patient Instructions (Signed)
Follow up in 4 months 

## 2014-11-06 ENCOUNTER — Encounter: Payer: Self-pay | Admitting: Family Medicine

## 2014-11-12 ENCOUNTER — Encounter: Payer: Self-pay | Admitting: Internal Medicine

## 2014-11-12 ENCOUNTER — Ambulatory Visit (INDEPENDENT_AMBULATORY_CARE_PROVIDER_SITE_OTHER): Payer: Medicare Other | Admitting: Internal Medicine

## 2014-11-12 VITALS — BP 132/58 | HR 83 | Temp 98.3°F | Ht 65.0 in | Wt 234.0 lb

## 2014-11-12 DIAGNOSIS — J449 Chronic obstructive pulmonary disease, unspecified: Secondary | ICD-10-CM | POA: Diagnosis not present

## 2014-11-12 DIAGNOSIS — J9611 Chronic respiratory failure with hypoxia: Secondary | ICD-10-CM

## 2014-11-12 NOTE — Assessment & Plan Note (Addendum)
COPD-Gold stage II Current COPD regiment include: 2 L oxygen at all times, Pulmicort nebulizer twice a day, rescue inhaler ( Xopenex), rescue nebulizer (albuterol). Patient has weaned herself down to 2 L of oxygen, currently on pulse dose, stop Spiriva due to financial restraints (not providing much critical benefit at this time, therefore this is okay). Advised patient to continue checking her saturations with 2 L, maintained saturations greater than 88% at all times.  Plan: - continue with 2L oxygen at all times, Pulmicort nebulizer and rescue inhaler  - 6 minute walk test in 2-3 weeks to determine oxygen requirement needs. - continue with exercise as tolerated.

## 2014-11-12 NOTE — Patient Instructions (Signed)
Follow up with Dr. Dema Severin in 4 months - 6 minute walk test in 2-3 weeks to determine O2 requirement, our office will call you with the results of this test. Remember to not use any oxygen about 15-20 minutes prior to the walk test.  If you do not need oxygen after the walk tests, then we will order a overnight pulse oximetry study.  -Continue with Pulmicort inhaler/nebulizer -Continue with albuterol as needed - Continue with diet, exercise, weight loss.

## 2014-11-12 NOTE — Progress Notes (Signed)
MRN# [id number] Julie Keith [birth year]   CC: Chief Complaint  Patient presents with  . Follow-up    Pt here f/u copd; pt is on 2 L pulse 02; pt has been taking 02 off daily 6-8 hrs and says 02 remains at 95%. Pt denies any sob.      Brief History: 06/28/14 HPI:  Patient is a pleasant 69 year old female presents today for COPD management transitioning care from her prior pulmonologist, Dr. Mayo Ao. Patient states she was diagnosed with COPD 9 years ago. She usually has 1 bronchitis episode per year, that happened winter or spring, whether changes definitely exacerbate her COPD. Current medications include: 3 L oxygen pulse dose, Spiriva, Xopenex inhaler as a rescue meds, albuterol nebulizer as a rescue med, Pulmicort nebulizer twice a day. Patient was a former smoker, quit in 2007, previously smoked 2.5 packs per day for 25 years. She does have a pet to get home. 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 Past medical history of COPD, psoriasis, diabetes, hypertension, chronic lower extremity edema. Typical COPD symptoms include dyspnea on exertion,mild increase in sputum production. Symptoms are exacerbated by weather changes, relieved with inhaler use a nebulizer use along with supplemental oxygen. Last bronchitis episode was in November 2015. Currently patient states that her breathing is at baseline. Plan - cont with 3L O2 via [place], cxr, pft,   ROV 08/02/14 patient is a pleasant 69 year old female presents today for follow-up visit of her COPD. At her last visit it was discussed that she would obtain a 6 minute walk test, primary function testing, and a chest x-ray. Since her last visit she has no further complaints of worsening shortness of breath, cough, runny nose, sputum production. She states that she has been out of Spiriva for the past 1.5 years and would like to be started back on it or given an alternative.   Events since last clinic  visit:  Patient presents today for a follow-up of her COPD. She states since her last visit she has not use any more Spiriva, due to financial restraints; however, she states that since stopping Spiriva she has not felt any difference in her breathing. Patient stated that overall one year. She has lost about 75 pounds and now can exert herself a little bit more. Before she could not walk to her neighbor's house without having significant exertion, now she can walk to her neighbor's house with relative ease. She is not tried climbing stairs yet. She is using her Pulmicort nebulizers as directed twice a day, and has not had the need for albuterol rescue inhaler in over 6 months. She denies any recent ED or urgent care visits for respiratory issues. Overall patient is very satisfied with improvement in her respiratory status, weight loss and overall health. Today she denies any cough, fever, congestion, nasal drainage. Patient also states that she has been walking more often, she has been weaning her oxygen down to 2 L and still maintaining an oxygen saturation greater than 88%, she is self checking a saturations with a finger pulse oximeter. Patient stated over the last year she has lost about 75 pounds with diet, exercise, healthy eating, and as a side effect of one of her psoriasis medications.    Medication:   Current Outpatient Rx  Name  Route  Sig  Dispense  Refill  . albuterol (PROVENTIL HFA;VENTOLIN HFA) 108 (90 BASE) MCG/ACT inhaler   Inhalation   Inhale 2 puffs into the lungs  every 6 (six) hours as needed for wheezing or shortness of breath.   1 Inhaler   2   . Apremilast 30 MG TABS   Oral   Take 30 mg by mouth 2 (two) times daily.         . budesonide (PULMICORT) 0.5 MG/2ML nebulizer solution   Nebulization   Take 0.5 mg by nebulization 2 (two) times daily.         . enalapril (VASOTEC) 20 MG tablet      Taking 2 in the AM and 1 in the PM   180 tablet   1   .  fexofenadine (ALLEGRA) 60 MG tablet   Oral   Take 60 mg by mouth 2 (two) times daily.         Marland Kitchen glimepiride (AMARYL) 2 MG tablet   Oral   Take 1 tablet (2 mg total) by mouth daily.   90 tablet   1   . loratadine (CLARITIN) 10 MG tablet   Oral   Take 1 tablet (10 mg total) by mouth daily.   90 tablet   1   . omeprazole (PRILOSEC) 20 MG capsule   Oral   Take 1 capsule (20 mg total) by mouth daily.   90 capsule   2   . pravastatin (PRAVACHOL) 40 MG tablet   Oral   Take 1 tablet (40 mg total) by mouth daily.   90 tablet   1   . torsemide (DEMADEX) 20 MG tablet   Oral   Take 1 tablet (20 mg total) by mouth once.   90 tablet   1      Review of Systems: Gen:  Denies  fever, sweats, chills HEENT: Denies blurred vision, double vision, ear pain, eye pain, hearing loss, nose bleeds, sore throat Cvc:  No dizziness, chest pain or heaviness Resp:   Admits to: Chronic shortness of breath, but it is improving Gi: Denies swallowing difficulty, stomach pain, nausea or vomiting, diarrhea, constipation, bowel incontinence Gu:  Denies bladder incontinence, burning urine Ext:   No Joint pain, stiffness or swelling Skin: No skin rash, easy bruising or bleeding or hives Endoc:  No polyuria, polydipsia , polyphagia or weight change Other:  All other systems negative  Allergies:  Review of patient's allergies indicates no known allergies.  Physical Examination:  VS: BP 132/58 mmHg  Pulse 83  Temp(Src) 98.3 F (36.8 C) (Oral)  Ht 5\' 5"  (1.651 m)  Wt 234 lb (106.142 kg)  BMI 38.94 kg/m2  SpO2 96%  General Appearance: No distress  HEENT: PERRLA, no ptosis, no other lesions noticed Pulmonary:normal breath sounds., diaphragmatic excursion normal.No wheezing, No rales   Cardiovascular:  Normal S1,S2.  No m/r/g.     Abdomen:Exam: Benign, Soft, non-tender, No masses  Skin:   warm, no rashes, no ecchymosis  Extremities: normal, no cyanosis, clubbing, warm with normal capillary  refill.     Assessment and Plan: COPD (chronic obstructive pulmonary disease) COPD-Gold stage II Current COPD regiment include: 2 L oxygen at all times, Pulmicort nebulizer twice a day, rescue inhaler ( Xopenex), rescue nebulizer (albuterol). Patient has weaned herself down to 2 L of oxygen, currently on pulse dose, stop Spiriva due to financial restraints (not providing much critical benefit at this time, therefore this is okay). Advised patient to continue checking her saturations with 2 L, maintained saturations greater than 88% at all times.  Plan: - continue with 2L oxygen at all times, Pulmicort nebulizer and rescue inhaler  -  6 minute walk test in 2-3 weeks to determine oxygen requirement needs. - continue with exercise as tolerated.      Chronic respiratory failure Secondary to COPD. Currently on 2 L of oxygen continuously.        Updated Medication List Outpatient Encounter Prescriptions as of 11/12/2014  Medication Sig  . albuterol (PROVENTIL HFA;VENTOLIN HFA) 108 (90 BASE) MCG/ACT inhaler Inhale 2 puffs into the lungs every 6 (six) hours as needed for wheezing or shortness of breath.  . Apremilast 30 MG TABS Take 30 mg by mouth 2 (two) times daily.  . budesonide (PULMICORT) 0.5 MG/2ML nebulizer solution Take 0.5 mg by nebulization 2 (two) times daily.  . enalapril (VASOTEC) 20 MG tablet Taking 2 in the AM and 1 in the PM  . fexofenadine (ALLEGRA) 60 MG tablet Take 60 mg by mouth 2 (two) times daily.  Marland Kitchen glimepiride (AMARYL) 2 MG tablet Take 1 tablet (2 mg total) by mouth daily.  Marland Kitchen loratadine (CLARITIN) 10 MG tablet Take 1 tablet (10 mg total) by mouth daily.  Marland Kitchen omeprazole (PRILOSEC) 20 MG capsule Take 1 capsule (20 mg total) by mouth daily.  . pravastatin (PRAVACHOL) 40 MG tablet Take 1 tablet (40 mg total) by mouth daily.  Marland Kitchen torsemide (DEMADEX) 20 MG tablet Take 1 tablet (20 mg total) by mouth once.  . [DISCONTINUED] Tiotropium Bromide Monohydrate (SPIRIVA  RESPIMAT) 2.5 MCG/ACT AERS Inhale 2 puffs into the lungs daily. (Patient not taking: Reported on 11/12/2014)   No facility-administered encounter medications on file as of 11/12/2014.    Orders for this visit: No orders of the defined types were placed in this encounter.    Thank  you for the visitation and for allowing  [facility] to assist in the care of your patient. Our recommendations are noted above.  Please contact us if we can be of further service.  Stephanie Acre, MD [facility] Office Number: [phone]

## 2014-11-12 NOTE — Assessment & Plan Note (Signed)
Secondary to COPD. Currently on 2 L of oxygen continuously.

## 2015-02-14 ENCOUNTER — Ambulatory Visit (INDEPENDENT_AMBULATORY_CARE_PROVIDER_SITE_OTHER): Payer: Medicare Other

## 2015-02-14 DIAGNOSIS — Z23 Encounter for immunization: Secondary | ICD-10-CM | POA: Diagnosis not present

## 2015-03-05 ENCOUNTER — Ambulatory Visit (INDEPENDENT_AMBULATORY_CARE_PROVIDER_SITE_OTHER): Payer: Medicare Other | Admitting: Family Medicine

## 2015-03-05 ENCOUNTER — Encounter: Payer: Self-pay | Admitting: Family Medicine

## 2015-03-05 VITALS — BP 136/78 | HR 100 | Temp 98.1°F | Resp 16 | Ht 65.0 in | Wt 234.0 lb

## 2015-03-05 DIAGNOSIS — E1121 Type 2 diabetes mellitus with diabetic nephropathy: Secondary | ICD-10-CM | POA: Diagnosis not present

## 2015-03-05 DIAGNOSIS — E669 Obesity, unspecified: Secondary | ICD-10-CM

## 2015-03-05 DIAGNOSIS — I10 Essential (primary) hypertension: Secondary | ICD-10-CM | POA: Diagnosis not present

## 2015-03-05 DIAGNOSIS — E785 Hyperlipidemia, unspecified: Secondary | ICD-10-CM | POA: Diagnosis not present

## 2015-03-05 DIAGNOSIS — J431 Panlobular emphysema: Secondary | ICD-10-CM | POA: Diagnosis not present

## 2015-03-05 LAB — POCT UA - MICROALBUMIN: MICROALBUMIN (UR) POC: 100 mg/L

## 2015-03-05 LAB — POCT GLYCOSYLATED HEMOGLOBIN (HGB A1C): Hemoglobin A1C: 7.6

## 2015-03-05 NOTE — Progress Notes (Signed)
Name: Julie Keith   MRN: [id number]    DOB: Oct 29, 1945   Date:03/05/2015       Progress Note  Subjective  Chief Complaint  Chief Complaint  Patient presents with  . Hypertension    4 month follow up  . Diabetes    glucose 118 at home  . COPD  . Hyperlipidemia    HPI  Hypertension   Patient presents for follow-up of hypertension. It has been present for over 5 years.  Patient states that there is compliance with medical regimen which consists of torsemide 20 mg daily and enalapril 20 mg daily . There is diabetic nephropathy. Cardiac risk factors include hypertension hyperlipidemia and diabetes.  Exercise regimen consist of some walking .  Diet consist of salt restriction .  Diabetes  Patient presents for follow-up of diabetes which is present for over 5 years. Is currently on a regimen of glimepiride 2 mg daily . Patient states usually good with their diet and exercise. There's been no hypoglycemic episodes and there is no polyuria polydipsia polyphagia. His average fasting glucoses been in the low around 116 with a high around 160 . There is no end organ disease.  Last diabetic eye exam was earlier this year.   Last visit with dietitian was greater than 1 year ago. Last microalbumin was obtained today and is elevated at 100 .   Hyperlipidemia  Patient has a history of hyperlipidemia for over 5 years.  Current medical regimen consist of pravastatin 40 mg daily at bedtime .  Compliance is good .  Diet and exercise are currently followed fairly well .  Risk factors for cardiovascular disease include hyperlipidemia - .   There have been no side effects from the medication.    COPD  Breathing has been stable of recent months. She continues to see her pulmonologist. Her medications include Pulmicort along with Ventolin HFA     Past Medical History  Diagnosis Date  . Hypertension   . Diabetes (HCC)   . Hypercholesteremia   . Emphysema lung (HCC)   . Allergic rhinitis      Social History  Substance Use Topics  . Smoking status: Former Smoker -- 2.00 packs/day for 30 years    Types: Cigarettes  . Smokeless tobacco: Never Used     Comment: quit 2006  . Alcohol Use: No     Current outpatient prescriptions:  .  albuterol (PROVENTIL HFA;VENTOLIN HFA) 108 (90 BASE) MCG/ACT inhaler, Inhale 2 puffs into the lungs every 6 (six) hours as needed for wheezing or shortness of breath., Disp: 1 Inhaler, Rfl: 2 .  Apremilast 30 MG TABS, Take 30 mg by mouth 2 (two) times daily., Disp: , Rfl:  .  budesonide (PULMICORT) 0.5 MG/2ML nebulizer solution, Take 0.5 mg by nebulization 2 (two) times daily., Disp: , Rfl:  .  enalapril (VASOTEC) 20 MG tablet, Taking 2 in the AM and 1 in the PM, Disp: 180 tablet, Rfl: 1 .  fexofenadine (ALLEGRA) 60 MG tablet, Take 60 mg by mouth 2 (two) times daily., Disp: , Rfl:  .  glimepiride (AMARYL) 2 MG tablet, Take 1 tablet (2 mg total) by mouth daily., Disp: 90 tablet, Rfl: 1 .  loratadine (CLARITIN) 10 MG tablet, Take 1 tablet (10 mg total) by mouth daily., Disp: 90 tablet, Rfl: 1 .  omeprazole (PRILOSEC) 20 MG capsule, Take 1 capsule (20 mg total) by mouth daily., Disp: 90 capsule, Rfl: 2 .  pravastatin (PRAVACHOL) 40 MG tablet, Take 1  tablet (40 mg total) by mouth daily., Disp: 90 tablet, Rfl: 1 .  torsemide (DEMADEX) 20 MG tablet, Take 1 tablet (20 mg total) by mouth once., Disp: 90 tablet, Rfl: 1  No Known Allergies  Review of Systems  Constitutional: Negative for fever, chills and weight loss.  HENT: Negative for congestion, hearing loss, sore throat and tinnitus.   Eyes: Negative for blurred vision, double vision and redness.  Respiratory: Positive for shortness of breath. Negative for cough and hemoptysis.   Cardiovascular: Positive for leg swelling. Negative for chest pain, palpitations, orthopnea and claudication.  Gastrointestinal: Negative for heartburn, nausea, vomiting, diarrhea, constipation and blood in stool.   Genitourinary: Negative for dysuria, urgency, frequency and hematuria.  Musculoskeletal: Negative for myalgias, back pain, joint pain, falls and neck pain.  Skin: Negative for itching.  Neurological: Positive for weakness and headaches. Negative for dizziness, tingling, tremors, focal weakness, seizures and loss of consciousness.  Endo/Heme/Allergies: Bruises/bleeds easily.  Psychiatric/Behavioral: Positive for depression. Negative for substance abuse. The patient is nervous/anxious and has insomnia.      Objective  Filed Vitals:   03/05/15 0743  BP: 136/78  Pulse: 100  Temp: 98.1 F (36.7 C)  TempSrc: Oral  Resp: 16  Height:  (1.651 m)  Weight: 234 lb (106.142 kg)  SpO2: 92%     Physical Exam  Constitutional: She is oriented to person, place, and time.  Obese and in no acute distress  HENT:  Head: Normocephalic.  Eyes: EOM are normal. Pupils are equal, round, and reactive to light.  Neck: Normal range of motion. No thyromegaly present.  Cardiovascular: Normal rate, regular rhythm and normal heart sounds.   No murmur heard. Pulmonary/Chest: Effort normal and breath sounds normal.  Abdominal: Soft. Bowel sounds are normal.  Musculoskeletal: Normal range of motion. She exhibits edema.  Neurological: She is alert and oriented to person, place, and time. No cranial nerve deficit. Gait normal.  Skin: Skin is warm and dry. No rash noted.  Psychiatric: Memory and affect normal.      Assessment & Plan   1. Type 2 diabetes mellitus with diabetic nephropathy, without long-term current use of insulin (HCC) Not at goal. Encouraged diet and exercise - POCT HgB A1C - POCT UA - Microalbumin - CBC - TSH  2. Essential hypertension Near goal. Encourage diet and exercise - CBC - TSH  3. Hyperlipemia At goal - Lipid panel  4. Panlobular emphysema (HCC) A pulmonologist is following this  5. Obesity Again encourage diet and exercise

## 2015-03-13 LAB — CBC
HEMATOCRIT: 38.9 % (ref 34.0–46.6)
Hemoglobin: 13.2 g/dL (ref 11.1–15.9)
MCH: 29.3 pg (ref 26.6–33.0)
MCHC: 33.9 g/dL (ref 31.5–35.7)
MCV: 86 fL (ref 79–97)
Platelets: 446 10*3/uL — ABNORMAL HIGH (ref 150–379)
RBC: 4.51 x10E6/uL (ref 3.77–5.28)
RDW: 14 % (ref 12.3–15.4)
WBC: 9.8 10*3/uL (ref 3.4–10.8)

## 2015-03-14 LAB — LIPID PANEL
CHOL/HDL RATIO: 3.9 ratio (ref 0.0–4.4)
CHOLESTEROL TOTAL: 205 mg/dL — AB (ref 100–199)
HDL: 52 mg/dL (ref >39)
LDL Calculated: 129 mg/dL — ABNORMAL HIGH (ref 0–99)
TRIGLYCERIDES: 122 mg/dL (ref 0–149)
VLDL Cholesterol Cal: 24 mg/dL (ref 5–40)

## 2015-03-14 LAB — TSH: TSH: 0.97 u[IU]/mL (ref 0.450–4.500)

## 2015-03-23 ENCOUNTER — Other Ambulatory Visit: Payer: Self-pay | Admitting: Family Medicine

## 2015-03-25 ENCOUNTER — Other Ambulatory Visit: Payer: Self-pay

## 2015-03-25 MED ORDER — GLUCOSE BLOOD VI STRP: ORAL_STRIP | Status: DC

## 2015-05-29 DIAGNOSIS — J449 Chronic obstructive pulmonary disease, unspecified: Secondary | ICD-10-CM | POA: Diagnosis not present

## 2015-05-31 ENCOUNTER — Encounter: Payer: Self-pay | Admitting: Internal Medicine

## 2015-05-31 ENCOUNTER — Ambulatory Visit (INDEPENDENT_AMBULATORY_CARE_PROVIDER_SITE_OTHER): Payer: Medicare Other | Admitting: Internal Medicine

## 2015-05-31 VITALS — BP 142/76 | HR 91 | Ht 65.0 in | Wt 232.0 lb

## 2015-05-31 DIAGNOSIS — J9611 Chronic respiratory failure with hypoxia: Secondary | ICD-10-CM | POA: Diagnosis not present

## 2015-05-31 DIAGNOSIS — J431 Panlobular emphysema: Secondary | ICD-10-CM

## 2015-05-31 NOTE — Progress Notes (Signed)
MRN# [id number] Julie Keith 03-May-1945   CC: Chief Complaint  Patient presents with  . Follow-up    Breathing well; no c/o SOB, CP tightness.      Brief History: 06/28/14 HPI:  Patient is a pleasant 70 year old female presents today for COPD management transitioning care from her prior pulmonologist, Dr. Mayo Ao. Patient states she was diagnosed with COPD 9 years ago. She usually has 1 bronchitis episode per year, that happened winter or spring, whether changes definitely exacerbate her COPD. Current medications include: 3 L oxygen pulse dose, Spiriva, Xopenex inhaler as a rescue meds, albuterol nebulizer as a rescue med, Pulmicort nebulizer twice a day. Patient was a former smoker, quit in 2007, previously smoked 2.5 packs per day for 25 years. She does have a pet to get home. 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 Past medical history of COPD, psoriasis, diabetes, hypertension, chronic lower extremity edema. Typical COPD symptoms include dyspnea on exertion,mild increase in sputum production. Symptoms are exacerbated by weather changes, relieved with inhaler use a nebulizer use along with supplemental oxygen. Last bronchitis episode was in November 2015. Currently patient states that her breathing is at baseline. Plan - cont with 3L O2 via [place], cxr, pft,   ROV 08/02/14 patient is a pleasant 70 year old female presents today for follow-up visit of her COPD. At her last visit it was discussed that she would obtain a 6 minute walk test, primary function testing, and a chest x-ray. Since her last visit she has no further complaints of worsening shortness of breath, cough, runny nose, sputum production. She states that she has been out of Spiriva for the past 1.5 years and would like to be started back on it or given an alternative.   ROV 10/2014  Patient presents today for a follow-up of her COPD. She states since her last visit she has not use  any more Spiriva, due to financial restraints; however, she states that since stopping Spiriva she has not felt any difference in her breathing. Patient stated that overall one year. She has lost about 75 pounds and now can exert herself a little bit more. Before she could not walk to her neighbor's house without having significant exertion, now she can walk to her neighbor's house with relative ease. She is not tried climbing stairs yet. She is using her Pulmicort nebulizers as directed twice a day, and has not had the need for albuterol rescue inhaler in over 6 months. She denies any recent ED or urgent care visits for respiratory issues. Overall patient is very satisfied with improvement in her respiratory status, weight loss and overall health. Today she denies any cough, fever, congestion, nasal drainage. Patient also states that she has been walking more often, she has been weaning her oxygen down to 2 L and still maintaining an oxygen saturation greater than 88%, she is self checking a saturations with a finger pulse oximeter. Patient stated over the last year she has lost about 75 pounds with diet, exercise, healthy eating, and as a side effect of one of her psoriasis medications. Plan - pulmicort, , cont with 2-3L cont O2  Events since last clinic visit: Patient presents today for follow-up visit of her COPD. Her current treatment includes Pulmicort nebulizers twice a day, and as needed Xopenex/albuterol. Since her last visit she has not had any further upper story tract infections, no use of prednisone, no urgent care visits any respiratory issues. She denies any fevers, chills,  shortness of breath worsening, worsening dyspnea on exertion. She currently has a diagnosis of psoriasis and is on [place], which is managing her psoriasis fairly well, but also has a beneficial side effect of losing about 1-2 pounds per month. Since she has started following with our pulmonary office, she has  lost significant amount of weight, and her breathing issues are significantly improved due to weight loss and current COPD regiment. Today she states that she is unable to complete a full 6 minute walk test, due to being time constraint, her husband currently has kidney stones and he needs to go to his physician appointments. Overall doing well with no significant worsening of pulmonary status.  Medication:   Current Outpatient Rx  Name  Route  Sig  Dispense  Refill  . albuterol (PROVENTIL HFA;VENTOLIN HFA) 108 (90 BASE) MCG/ACT inhaler   Inhalation   Inhale 2 puffs into the lungs every 6 (six) hours as needed for wheezing or shortness of breath.   1 Inhaler   2   . Apremilast 30 MG TABS   Oral   Take 30 mg by mouth 2 (two) times daily.         . budesonide (PULMICORT) 0.5 MG/2ML nebulizer solution   Nebulization   Take 0.5 mg by nebulization 2 (two) times daily.         . enalapril (VASOTEC) 20 MG tablet      Taking 2 in the AM and 1 in the PM   180 tablet   1   . glimepiride (AMARYL) 2 MG tablet   Oral   Take 1 tablet (2 mg total) by mouth daily.   90 tablet   1   . glucose blood (ACCU-CHEK COMPACT PLUS) test strip      USE ONE STRIP TO CHECK GLUCOSE THREE TIMES DAILY   270 each   3     ICD 10 E11.21   . omeprazole (PRILOSEC) 20 MG capsule   Oral   Take 1 capsule (20 mg total) by mouth daily.   90 capsule   2   . pravastatin (PRAVACHOL) 40 MG tablet   Oral   Take 1 tablet (40 mg total) by mouth daily.   90 tablet   1   . torsemide (DEMADEX) 20 MG tablet   Oral   Take 1 tablet (20 mg total) by mouth once.   90 tablet   1      Review of Systems: Gen:  Denies  fever, sweats, chills HEENT: Denies blurred vision, double vision, ear pain, eye pain, hearing loss, nose bleeds, sore throat Cvc:  No dizziness, chest pain or heaviness Resp:   Admits to: Chronic shortness of breath, but it is improving Gi: Denies swallowing difficulty, stomach pain, nausea  or vomiting, diarrhea, constipation, bowel incontinence Gu:  Denies bladder incontinence, burning urine Ext:   No Joint pain, stiffness or swelling Skin: No skin rash, easy bruising or bleeding or hives Endoc:  No polyuria, polydipsia , polyphagia or weight change Other:  All other systems negative  Allergies:  Review of patient's allergies indicates no known allergies.  Physical Examination:  VS: BP 142/76 mmHg  Pulse 91  Ht 5\' 5"  (1.651 m)  Wt 232 lb (105.235 kg)  BMI 38.61 kg/m2  SpO2 95%  General Appearance: No distress  HEENT: PERRLA, no ptosis, no other lesions noticed Pulmonary:normal breath sounds., diaphragmatic excursion normal.No wheezing, No rales   Cardiovascular:  Normal S1,S2.  No m/r/g.  Abdomen:Exam: Benign, Soft, non-tender, No masses  Skin:   warm, no rashes, no ecchymosis  Extremities: normal, no cyanosis, clubbing, warm with normal capillary refill.     Assessment and Plan: COPD (chronic obstructive pulmonary disease) COPD-Gold stage II Current COPD regiment include: 2 L oxygen at all times, Pulmicort nebulizer twice a day, rescue inhaler ( Xopenex), rescue nebulizer (albuterol). Patient has weaned herself down to 2 L of oxygen, currently on pulse dose, stop Spiriva due to financial restraints (not providing much critical benefit at this time, therefore this is okay). Advised patient to continue checking her saturations with 2 L, maintained saturations greater than 88% at all times.  Plan: - continue with 2L oxygen at all times, Pulmicort nebulizer and rescue inhaler  - 6 minute walk test prior to follow up visit in 6 months.  - continue with exercise as tolerated.        Chronic respiratory failure Secondary to COPD. Currently on 2 L of oxygen continuously. Completed ambulatory test today, on 2 L of supplemental oxygen, no desaturations on this level of oxygen below 88%.  Plan: -Continue with 2 L of supplemental oxygen  continuously.         Updated Medication List Outpatient Encounter Prescriptions as of 05/31/2015  Medication Sig  . albuterol (PROVENTIL HFA;VENTOLIN HFA) 108 (90 BASE) MCG/ACT inhaler Inhale 2 puffs into the lungs every 6 (six) hours as needed for wheezing or shortness of breath.  . Apremilast 30 MG TABS Take 30 mg by mouth 2 (two) times daily.  . budesonide (PULMICORT) 0.5 MG/2ML nebulizer solution Take 0.5 mg by nebulization 2 (two) times daily.  . enalapril (VASOTEC) 20 MG tablet Taking 2 in the AM and 1 in the PM  . glimepiride (AMARYL) 2 MG tablet Take 1 tablet (2 mg total) by mouth daily.  Marland Kitchen glucose blood (ACCU-CHEK COMPACT PLUS) test strip USE ONE STRIP TO CHECK GLUCOSE THREE TIMES DAILY  . omeprazole (PRILOSEC) 20 MG capsule Take 1 capsule (20 mg total) by mouth daily.  . pravastatin (PRAVACHOL) 40 MG tablet Take 1 tablet (40 mg total) by mouth daily.  Marland Kitchen torsemide (DEMADEX) 20 MG tablet Take 1 tablet (20 mg total) by mouth once.  . [DISCONTINUED] fexofenadine (ALLEGRA) 60 MG tablet Take 60 mg by mouth 2 (two) times daily.  . [DISCONTINUED] loratadine (CLARITIN) 10 MG tablet Take 1 tablet (10 mg total) by mouth daily.   No facility-administered encounter medications on file as of 05/31/2015.    Orders for this visit: Orders Placed This Encounter  Procedures  . 6 minute walk    Standing Status: Future     Number of Occurrences:      Standing Expiration Date: 05/30/2016    Order Specific Question:  Where should this test be performed?    Answer:  Other    Thank  you for the visitation and for allowing  [facility] to assist in the care of your patient. Our recommendations are noted above.  Please contact us if we can be of further service.  Stephanie Acre, MD [facility] Office Number: [phone]

## 2015-05-31 NOTE — Patient Instructions (Signed)
Follow up with Dr. Dema Severin in: 6months - cont with pulmicort nebs - cont with diet and exercise - 6 minute walk test sometime before you next visit.  Call us to schedule this.  - cont with 2L o2 continuously.

## 2015-05-31 NOTE — Assessment & Plan Note (Signed)
COPD-Gold stage II Current COPD regiment include: 2 L oxygen at all times, Pulmicort nebulizer twice a day, rescue inhaler ( Xopenex), rescue nebulizer (albuterol). Patient has weaned herself down to 2 L of oxygen, currently on pulse dose, stop Spiriva due to financial restraints (not providing much critical benefit at this time, therefore this is okay). Advised patient to continue checking her saturations with 2 L, maintained saturations greater than 88% at all times.  Plan: - continue with 2L oxygen at all times, Pulmicort nebulizer and rescue inhaler  - 6 minute walk test prior to follow up visit in 6 months.  - continue with exercise as tolerated.

## 2015-05-31 NOTE — Assessment & Plan Note (Signed)
Secondary to COPD. Currently on 2 L of oxygen continuously. Completed ambulatory test today, on 2 L of supplemental oxygen, no desaturations on this level of oxygen below 88%.  Plan: -Continue with 2 L of supplemental oxygen continuously.

## 2015-06-20 ENCOUNTER — Telehealth: Payer: Self-pay | Admitting: Family Medicine

## 2015-06-20 ENCOUNTER — Other Ambulatory Visit: Payer: Self-pay

## 2015-06-20 DIAGNOSIS — E1121 Type 2 diabetes mellitus with diabetic nephropathy: Secondary | ICD-10-CM | POA: Diagnosis not present

## 2015-06-20 MED ORDER — PRAVASTATIN SODIUM 40 MG PO TABS: 40.0000 mg | ORAL_TABLET | Freq: Every day | ORAL | Status: DC

## 2015-06-20 MED ORDER — ENALAPRIL MALEATE 20 MG PO TABS: ORAL_TABLET | ORAL | Status: DC

## 2015-06-20 MED ORDER — GLIMEPIRIDE 2 MG PO TABS: 2.0000 mg | ORAL_TABLET | Freq: Every day | ORAL | Status: DC

## 2015-06-20 MED ORDER — PRAVASTATIN SODIUM 40 MG PO TABS
40.0000 mg | ORAL_TABLET | Freq: Every day | ORAL | Status: DC
Start: 2015-06-20 — End: 2015-06-20

## 2015-06-20 MED ORDER — OMEPRAZOLE 20 MG PO CPDR
20.0000 mg | DELAYED_RELEASE_CAPSULE | Freq: Every day | ORAL | Status: DC
Start: 2015-06-20 — End: 2015-09-23

## 2015-06-20 MED ORDER — OMEPRAZOLE 20 MG PO CPDR: 20.0000 mg | DELAYED_RELEASE_CAPSULE | Freq: Every day | ORAL | Status: DC

## 2015-06-20 MED ORDER — TORSEMIDE 20 MG PO TABS: 20.0000 mg | ORAL_TABLET | Freq: Once | ORAL | Status: DC

## 2015-06-20 NOTE — Telephone Encounter (Signed)
Patient had to resch appointment due to provider not being in the office for May. Please refill all her medications and send them to [email or web address].

## 2015-06-20 NOTE — Telephone Encounter (Signed)
Refills sent to pharmacy. 

## 2015-06-21 NOTE — Telephone Encounter (Signed)
Patient informed. 

## 2015-06-26 DIAGNOSIS — J449 Chronic obstructive pulmonary disease, unspecified: Secondary | ICD-10-CM | POA: Diagnosis not present

## 2015-07-08 ENCOUNTER — Ambulatory Visit: Payer: Medicare Other | Admitting: Family Medicine

## 2015-07-27 DIAGNOSIS — J449 Chronic obstructive pulmonary disease, unspecified: Secondary | ICD-10-CM | POA: Diagnosis not present

## 2015-08-14 DIAGNOSIS — L400 Psoriasis vulgaris: Secondary | ICD-10-CM | POA: Diagnosis not present

## 2015-08-20 ENCOUNTER — Ambulatory Visit: Payer: Medicare Other | Admitting: Family Medicine

## 2015-08-23 ENCOUNTER — Ambulatory Visit: Payer: Medicare Other | Admitting: Family Medicine

## 2015-08-26 DIAGNOSIS — J449 Chronic obstructive pulmonary disease, unspecified: Secondary | ICD-10-CM | POA: Diagnosis not present

## 2015-09-20 ENCOUNTER — Telehealth: Payer: Self-pay | Admitting: Family Medicine

## 2015-09-20 NOTE — Telephone Encounter (Signed)
PT NEEDS REFILL ON ALL MEDICATIONS. IS MAKING APPTS WITH LADA WITH HER HUSBAND ALSO. PH[facility] [place]

## 2015-09-23 MED ORDER — TORSEMIDE 20 MG PO TABS: 20.0000 mg | ORAL_TABLET | Freq: Once | ORAL | Status: DC

## 2015-09-23 MED ORDER — OMEPRAZOLE 20 MG PO CPDR: 20.0000 mg | DELAYED_RELEASE_CAPSULE | Freq: Every day | ORAL | Status: DC

## 2015-09-23 MED ORDER — GLIMEPIRIDE 2 MG PO TABS: 2.0000 mg | ORAL_TABLET | Freq: Every day | ORAL | Status: DC

## 2015-09-23 MED ORDER — PRAVASTATIN SODIUM 40 MG PO TABS: 40.0000 mg | ORAL_TABLET | Freq: Every day | ORAL | Status: DC

## 2015-09-23 MED ORDER — ENALAPRIL MALEATE 20 MG PO TABS: ORAL_TABLET | ORAL | Status: DC

## 2015-09-23 NOTE — Telephone Encounter (Signed)
Sent 30 day supply to pt pharmacy

## 2015-09-26 DIAGNOSIS — J449 Chronic obstructive pulmonary disease, unspecified: Secondary | ICD-10-CM | POA: Diagnosis not present

## 2015-10-10 ENCOUNTER — Encounter: Payer: Self-pay | Admitting: Family Medicine

## 2015-10-10 ENCOUNTER — Ambulatory Visit (INDEPENDENT_AMBULATORY_CARE_PROVIDER_SITE_OTHER): Payer: Medicare Other | Admitting: Family Medicine

## 2015-10-10 ENCOUNTER — Other Ambulatory Visit: Payer: Self-pay

## 2015-10-10 VITALS — BP 118/66 | HR 97 | Temp 98.1°F | Resp 16 | Wt 232.0 lb

## 2015-10-10 DIAGNOSIS — L409 Psoriasis, unspecified: Secondary | ICD-10-CM

## 2015-10-10 DIAGNOSIS — I10 Essential (primary) hypertension: Secondary | ICD-10-CM | POA: Diagnosis not present

## 2015-10-10 DIAGNOSIS — Z5181 Encounter for therapeutic drug level monitoring: Secondary | ICD-10-CM

## 2015-10-10 DIAGNOSIS — E119 Type 2 diabetes mellitus without complications: Secondary | ICD-10-CM

## 2015-10-10 DIAGNOSIS — E785 Hyperlipidemia, unspecified: Secondary | ICD-10-CM | POA: Diagnosis not present

## 2015-10-10 DIAGNOSIS — E669 Obesity, unspecified: Secondary | ICD-10-CM

## 2015-10-10 DIAGNOSIS — E1121 Type 2 diabetes mellitus with diabetic nephropathy: Secondary | ICD-10-CM | POA: Diagnosis not present

## 2015-10-10 DIAGNOSIS — J9611 Chronic respiratory failure with hypoxia: Secondary | ICD-10-CM

## 2015-10-10 DIAGNOSIS — K219 Gastro-esophageal reflux disease without esophagitis: Secondary | ICD-10-CM | POA: Insufficient documentation

## 2015-10-10 HISTORY — DX: Psoriasis, unspecified: L40.9

## 2015-10-10 HISTORY — DX: Gastro-esophageal reflux disease without esophagitis: K21.9

## 2015-10-10 LAB — LIPID PANEL
CHOL/HDL RATIO: 3.8 ratio (ref <=5.0)
Cholesterol: 196 mg/dL (ref 125–200)
HDL: 52 mg/dL (ref >=46)
LDL CALC: 123 mg/dL (ref <130)
TRIGLYCERIDES: 107 mg/dL (ref <150)
VLDL: 21 mg/dL (ref <30)

## 2015-10-10 LAB — COMPLETE METABOLIC PANEL WITH GFR
ALT: 11 U/L (ref 6–29)
AST: 14 U/L (ref 10–35)
Albumin: 3.8 g/dL (ref 3.6–5.1)
Alkaline Phosphatase: 79 U/L (ref 33–130)
BUN: 12 mg/dL (ref 7–25)
CALCIUM: 9 mg/dL (ref 8.6–10.4)
CHLORIDE: 98 mmol/L (ref 98–110)
CO2: 32 mmol/L — AB (ref 20–31)
Creat: 1.15 mg/dL — ABNORMAL HIGH (ref 0.60–0.93)
GFR, EST AFRICAN AMERICAN: 55 mL/min — AB (ref >=60)
GFR, EST NON AFRICAN AMERICAN: 48 mL/min — AB (ref >=60)
Glucose, Bld: 144 mg/dL — ABNORMAL HIGH (ref 65–99)
POTASSIUM: 5 mmol/L (ref 3.5–5.3)
Sodium: 140 mmol/L (ref 135–146)
Total Bilirubin: 0.4 mg/dL (ref 0.2–1.2)
Total Protein: 7.1 g/dL (ref 6.1–8.1)

## 2015-10-10 LAB — HEMOGLOBIN A1C
HEMOGLOBIN A1C: 7.4 % — AB (ref <5.7)
MEAN PLASMA GLUCOSE: 166 mg/dL

## 2015-10-10 NOTE — Assessment & Plan Note (Signed)
Well-controlled today; check creatinine, K+

## 2015-10-10 NOTE — Assessment & Plan Note (Signed)
Foot exam by MD today; pt will check feet every night; start aspirin, continue meds; offer to decrease your medicine if low sugars; check A1c today and urine microalbumin:Cr

## 2015-10-10 NOTE — Patient Instructions (Addendum)
If you need something for aches or pains, try to use Tylenol (acetaminphen) instead of non-steroidals (which include Aleve, ibuprofen, Advil, Motrin, and naproxen); non-steroidals can cause long-term kidney damage  Start 81 mg aspirin daily, make sure it's coated to protect your stomach  Let's get labs today  Please do see your eye doctor regularly, and have your eyes examined every year (or more often per his or her recommendation) Check your feet every night and let me know right away of any sores, infections, numbness, etc. Try to limit sweets, white bread, white rice, white potatoes  Check out the information at familydoctor.org entitled "Nutrition for Weight Loss: What You Need to Know about Fad Diets" Try to lose between 1-2 pounds per week by taking in fewer calories and burning off more calories You can succeed by limiting portions, limiting foods dense in calories and fat, becoming more active, and drinking 8 glasses of water a day (64 ounces) Don't skip meals, especially breakfast, as skipping meals may alter your metabolism Do not use over-the-counter weight loss pills or gimmicks that claim rapid weight loss A healthy BMI (or body mass index) is between 18.5 and 24.9 You can calculate your ideal BMI at the NIH website [employer]huhttp://www.nhlbi.nih.gov/health/educational/lose_wt/BMI/bmicalc.htm

## 2015-10-10 NOTE — Assessment & Plan Note (Signed)
Limit saturated fats; check lipids today and SGPT; continue statin

## 2015-10-10 NOTE — Assessment & Plan Note (Signed)
On supplemental oxygen and inhalers; seeing pulmonologist

## 2015-10-10 NOTE — Progress Notes (Signed)
BP 118/66 mmHg  Pulse 97  Temp(Src) 98.1 F (36.7 C) (Oral)  Resp 16  Wt 232 lb (105.235 kg)  SpO2 94%   Subjective:    Patient ID: Julie BeetsMary P Keith, female    DOB: 1946/04/13, 70 y.o.   MRN: [id number]  HPI: Julie Keith is a 70 y.o. female  Chief Complaint  Patient presents with  . Medication Refill  . Cough   Type 2 diabetes; no really problems with feet; taking amaryl; does get low sugars about once a month, different times; she gets sunny delight orange drinks; FSBS 113 this morning  Hypertension; taking enalapril and torsedemide; avoiding salt  High cholesterol; taking pravastatin; no muscle aches; watching a good diet  Heartburn and reflux; taking omeprazole; some spicy foods triggers; no blood in the stool  COPD; on supplemental oxygen; having a cough; not all the time, little bits; last week or week before, she had a little sore throat; no more SHOB; slight fever 1 week ago  Depression screen [facility] 10/10/2015 03/05/2015  Decreased Interest 0 0  Down, Depressed, Hopeless 0 0  PHQ - 2 Score 0 0   Relevant past medical, surgical, family and social history reviewed Past Medical History  Diagnosis Date  . Hypertension   . Diabetes (HCC)   . Hypercholesteremia   . Emphysema lung (HCC)   . Allergic rhinitis   . Psoriasis 10/10/2015  . GERD (gastroesophageal reflux disease) 10/10/2015   Past Surgical History  Procedure Laterality Date  . Cholecystectomy    . Cervical spine surgery    . Abdominal hysterectomy    . Mastectomy Bilateral    Family History  Problem Relation Age of Onset  . Leukemia Mother   . Pneumonia Father   . COPD Father    Social History  Substance Use Topics  . Smoking status: Former Smoker -- 2.00 packs/day for 30 years    Types: Cigarettes  . Smokeless tobacco: Never Used     Comment: quit 2006  . Alcohol Use: No   Interim medical history since last visit reviewed. Allergies and medications reviewed  Review of Systems Per  HPI unless specifically indicated above     Objective:    BP 118/66 mmHg  Pulse 97  Temp(Src) 98.1 F (36.7 C) (Oral)  Resp 16  Wt 232 lb (105.235 kg)  SpO2 94%  Wt Readings from Last 3 Encounters:  10/10/15 232 lb (105.235 kg)  05/31/15 232 lb (105.235 kg)  03/05/15 234 lb (106.142 kg)    Physical Exam  Constitutional: She appears well-developed and well-nourished. No distress.  HENT:  Head: Normocephalic and atraumatic.  Eyes: EOM are normal. No scleral icterus.  Neck: No thyromegaly present.  Cardiovascular: Normal rate, regular rhythm and normal heart sounds.   No murmur heard. Pulmonary/Chest: Effort normal and breath sounds normal. No respiratory distress. She has no wheezes.  Wearing oxygen via [place]  Abdominal: Soft. Bowel sounds are normal. She exhibits no distension.  Musculoskeletal: Normal range of motion. She exhibits no edema.  Neurological: She is alert. She exhibits normal muscle tone.  Skin: Skin is warm and dry. She is not diaphoretic. No pallor.  Psychiatric: She has a normal mood and affect. Her behavior is normal. Judgment and thought content normal.    Diabetic Foot Form - Detailed   Diabetic Foot Exam - detailed  Diabetic Foot exam was performed with the following findings:  Yes 10/10/2015  8:24 AM  Visual Foot Exam completed.:  Yes  Are the toenails long?:  No  Are the toenails thick?:  No  Are the toenails ingrown?:  No  Normal Range of Motion:  Yes    Pulse Foot Exam completed.:  Yes  Right Dorsalis Pedis:  Present Left Dorsalis Pedis:  Present  Sensory Foot Exam Completed.:  Yes  Semmes-Weinstein Monofilament Test  R Site 1-Great Toe:  Pos L Site 1-Great Toe:  Pos  R Site 4:  Pos L Site 4:  Pos  R Site 5:  Pos L Site 5:  Pos        Results for orders placed or performed in visit on 03/05/15  CBC  Result Value Ref Range   WBC 9.8 3.4 - 10.8 x10E3/uL   RBC 4.51 3.77 - 5.28 x10E6/uL   Hemoglobin 13.2 11.1 - 15.9 g/dL   Hematocrit 45.438.9  09.834.0 - 46.6 %   MCV 86 79 - 97 fL   MCH 29.3 26.6 - 33.0 pg   MCHC 33.9 31.5 - 35.7 g/dL   RDW 11.914.0 14.712.3 - 82.915.4 %   Platelets 446 (H) 150 - 379 x10E3/uL  Lipid panel  Result Value Ref Range   Cholesterol, Total 205 (H) 100 - 199 mg/dL   Triglycerides 562122 0 - 149 mg/dL   HDL 52 >13>39 mg/dL   VLDL Cholesterol Cal 24 5 - 40 mg/dL   LDL Calculated 086129 (H) 0 - 99 mg/dL   Chol/HDL Ratio 3.9 0.0 - 4.4 ratio units  TSH  Result Value Ref Range   TSH 0.970 0.450 - 4.500 uIU/mL  POCT HgB A1C  Result Value Ref Range   Hemoglobin A1C 7.6   POCT UA - Microalbumin  Result Value Ref Range   Microalbumin Ur, POC 100 mg/L   Creatinine, POC  mg/dL   Albumin/Creatinine Ratio, Urine, POC        Assessment & Plan:   Problem List Items Addressed This Visit      Cardiovascular and Mediastinum   Essential hypertension - Primary    Well-controlled today; check creatinine, K+      Relevant Medications   aspirin EC 81 MG tablet     Respiratory   Chronic respiratory failure (HCC)    On supplemental oxygen and inhalers; seeing pulmonologist        Digestive   GERD (gastroesophageal reflux disease)     Endocrine   Type 2 diabetes mellitus without complication (HCC)    Foot exam by MD today; pt will check feet every night; start aspirin, continue meds; offer to decrease your medicine if low sugars; check A1c today and urine microalbumin:Cr      Relevant Medications   aspirin EC 81 MG tablet   Other Relevant Orders   Lipid panel   Microalbumin / creatinine urine ratio   Hemoglobin A1c     Musculoskeletal and Integument   Psoriasis     Other   Hyperlipidemia    Limit saturated fats; check lipids today and SGPT; continue statin      Relevant Medications   aspirin EC 81 MG tablet   Other Relevant Orders   Lipid panel   Medication monitoring encounter   Relevant Orders   COMPLETE METABOLIC PANEL WITH GFR   Obesity    So glad she is trying to lose weight; see AVS           Follow up plan: Return in about 6 months (around 04/10/2016) for visit and fasting labs.  An after-visit summary was printed and given  to the patient at check-out.  Please see the patient instructions which may contain other information and recommendations beyond what is mentioned above in the assessment and plan.  Meds ordered this encounter  Medications  . aspirin EC 81 MG tablet    Sig: Take 1 tablet (81 mg total) by mouth daily.    Dispense:  30 tablet    Refill:  11    Orders Placed This Encounter  Procedures  . Lipid panel  . COMPLETE METABOLIC PANEL WITH GFR  . Microalbumin / creatinine urine ratio  . Hemoglobin A1c

## 2015-10-10 NOTE — Assessment & Plan Note (Signed)
So glad she is trying to lose weight; see AVS

## 2015-10-11 ENCOUNTER — Other Ambulatory Visit: Payer: Self-pay | Admitting: Family Medicine

## 2015-10-11 MED ORDER — ATORVASTATIN CALCIUM 40 MG PO TABS: 40.0000 mg | ORAL_TABLET | Freq: Every day | ORAL | Status: DC

## 2015-10-11 MED ORDER — SITAGLIPTIN PHOSPHATE 50 MG PO TABS: 50.0000 mg | ORAL_TABLET | Freq: Every day | ORAL | Status: DC

## 2015-10-14 ENCOUNTER — Telehealth: Payer: Self-pay | Admitting: Family Medicine

## 2015-10-14 MED ORDER — GLUCOSE BLOOD VI STRP: ORAL_STRIP | Status: DC

## 2015-10-14 MED ORDER — ENALAPRIL MALEATE 20 MG PO TABS: 20.0000 mg | ORAL_TABLET | Freq: Two times a day (BID) | ORAL | Status: DC

## 2015-10-14 MED ORDER — PRAVASTATIN SODIUM 80 MG PO TABS: 80.0000 mg | ORAL_TABLET | Freq: Every day | ORAL | Status: DC

## 2015-10-14 MED ORDER — GLIMEPIRIDE 2 MG PO TABS: 2.0000 mg | ORAL_TABLET | Freq: Every day | ORAL | Status: DC

## 2015-10-14 NOTE — Telephone Encounter (Signed)
Patient came into the office wanting to pick up her prescriptions. When I informed her that you had sent atorvastatin into the pharmacy she stated that she does not take that medication that it was to expensive. She takes pravastatin. She would like hard copies of all her prescriptions so she could take them to the pharmacy. Please leave message on machine once ready. Was seen last week and does not understand why prescriptions was not done during appt.

## 2015-10-14 NOTE — Telephone Encounter (Signed)
I changed her cholesterol medicine because her pravastatin wasn't effective enough; if she refuses to take atorvastatin, then I'll okay the pravastatin but we'll double the dose to 80 mg at bedtime; we'll want to recheck her lipids and sgpt in 6 weeks I talked to patient, explained above; she'll do the pravastatin She refuses the Januvia; she wants to go back on glimepiride even though she gets low sugars; I pointed out that drug is likely the cause of her lows, and she says she knows and she'll drink OJ; she was persistent and wouuld not take the Januvia I do not want her on torsemide if she doesn't have heart failure Test strips okay for once a day prn, but not 3x a day

## 2015-10-14 NOTE — Telephone Encounter (Signed)
Wants test results also says needs refills all her meds. Is almost out. Wants the rx to be hard copies and not called into pharm.

## 2015-10-17 ENCOUNTER — Ambulatory Visit (INDEPENDENT_AMBULATORY_CARE_PROVIDER_SITE_OTHER): Payer: Medicare Other | Admitting: Internal Medicine

## 2015-10-17 ENCOUNTER — Encounter: Payer: Self-pay | Admitting: Internal Medicine

## 2015-10-17 VITALS — BP 150/92 | HR 90 | Ht 65.0 in | Wt 231.0 lb

## 2015-10-17 DIAGNOSIS — J431 Panlobular emphysema: Secondary | ICD-10-CM | POA: Diagnosis not present

## 2015-10-17 DIAGNOSIS — J441 Chronic obstructive pulmonary disease with (acute) exacerbation: Secondary | ICD-10-CM

## 2015-10-17 DIAGNOSIS — J9611 Chronic respiratory failure with hypoxia: Secondary | ICD-10-CM | POA: Diagnosis not present

## 2015-10-17 MED ORDER — PREDNISONE 20 MG PO TABS: 20.0000 mg | ORAL_TABLET | Freq: Every day | ORAL | Status: DC

## 2015-10-17 MED ORDER — BENZONATATE 100 MG PO CAPS: 100.0000 mg | ORAL_CAPSULE | Freq: Three times a day (TID) | ORAL | Status: DC | PRN

## 2015-10-17 NOTE — Patient Instructions (Signed)
Follow up with Dr. Dema SeverinMungal in:2 months - 6MWT test prior to follow up and inoffice spirometry - Prednisone 20mg  - 1 tab daily x 5 days with breakfast - Tessalon Perles - 100mg  - 1 tab Q8hrs, PRN coughing spell - albuterol inhaler - 2puff every 3-4 hours as needed for shortness of breath\wheezing\recurrent cough - avoid allergens as much as possible.

## 2015-10-17 NOTE — Assessment & Plan Note (Addendum)
Patient with acute COPD exacerbation today, as manifest by continuous use of albuterol and increasing cough. At this time will treat early with prednisone 20 mg daily 5 days. Patient advised to avoid all forms of tobacco and allergens. No improvement in 2-3 days, with prednisone, will then consider adding Z-Pak

## 2015-10-17 NOTE — Progress Notes (Signed)
MRN# [id number] Julie Keith 01-29-1946   CC: Chief Complaint  Patient presents with  . Acute Visit    dry cough x 1 week; HA from coughing; chest tightness;      Brief History: 06/28/14 HPI:  Patient is a pleasant 70 year old female presents today for COPD management transitioning care from her prior pulmonologist, Dr. Mayo Ao. Patient states she was diagnosed with COPD 9 years ago. She usually has 1 bronchitis episode per year, that happened winter or spring, whether changes definitely exacerbate her COPD. Current medications include: 3 L oxygen pulse dose, Spiriva, Xopenex inhaler as a rescue meds, albuterol nebulizer as a rescue med, Pulmicort nebulizer twice a day. Patient was a former smoker, quit in 2007, previously smoked 2.5 packs per day for 25 years. She does have a pet to get home. 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 Past medical history of COPD, psoriasis, diabetes, hypertension, chronic lower extremity edema. Typical COPD symptoms include dyspnea on exertion,mild increase in sputum production. Symptoms are exacerbated by weather changes, relieved with inhaler use a nebulizer use along with supplemental oxygen. Last bronchitis episode was in November 2015. Currently patient states that her breathing is at baseline. Plan - cont with 3L O2 via [place], cxr, pft,  Events since last clinic visit: Presents today for an acute care visit of chronic cough over the past 7-10 days. She stated her cough started that time after significant reining in the area. She believes of the rapid environmental changes of induce for COPD and made her coughing more. Cough is nonproductive, keeping her up at night, getting worse as the day progresses. She's had to use albuterol 1-2 times per day over the last 7-10 days. She denies any fever, chills, night sweats, diarrhea, nausea, vomiting. Currently on baseline oxygen, for which she's not had to  increase. Stated in the past Tessalon Perles has helped her cough  Medication:   Current Outpatient Rx  Name  Route  Sig  Dispense  Refill  . albuterol (PROVENTIL HFA;VENTOLIN HFA) 108 (90 BASE) MCG/ACT inhaler   Inhalation   Inhale 2 puffs into the lungs every 6 (six) hours as needed for wheezing or shortness of breath.   1 Inhaler   2   . Apremilast 30 MG TABS   Oral   Take 30 mg by mouth 2 (two) times daily.         Marland Kitchen aspirin EC 81 MG tablet   Oral   Take 1 tablet (81 mg total) by mouth daily.   30 tablet   11   . budesonide (PULMICORT) 0.5 MG/2ML nebulizer solution   Nebulization   Take 0.5 mg by nebulization 2 (two) times daily.         . enalapril (VASOTEC) 20 MG tablet   Oral   Take 1 tablet (20 mg total) by mouth 2 (two) times daily.   180 tablet   0   . glimepiride (AMARYL) 2 MG tablet   Oral   Take 1 tablet (2 mg total) by mouth daily with breakfast.   90 tablet   0   . glucose blood (ACCU-CHEK COMPACT PLUS) test strip      USE ONE STRIP TO CHECK GLUCOSE ONCE a day if needed   100 each   0     ICD 10 E11.21   . omeprazole (PRILOSEC) 20 MG capsule   Oral   Take 1 capsule (20 mg total) by mouth daily.  30 capsule   0   . pravastatin (PRAVACHOL) 80 MG tablet   Oral   Take 1 tablet (80 mg total) by mouth daily.   90 tablet   0     Patient does not want to take atorvastatin; cancel ...   . benzonatate (TESSALON) 100 MG capsule   Oral   Take 1 capsule (100 mg total) by mouth every 8 (eight) hours as needed for cough.   21 capsule   0   . predniSONE (DELTASONE) 20 MG tablet   Oral   Take 1 tablet (20 mg total) by mouth daily with breakfast.   5 tablet   0      Review of Systems: Gen:  Denies  fever, sweats, chills HEENT: Denies blurred vision, double vision, ear pain, eye pain, hearing loss, nose bleeds, sore throat Cvc:  No dizziness, chest pain or heaviness Resp:   Admits to: Chronic shortness of breath.  Non-productive  cough Gi: Denies swallowing difficulty, stomach pain, nausea or vomiting, diarrhea, constipation, bowel incontinence Gu:  Denies bladder incontinence, burning urine Ext:   No Joint pain, stiffness or swelling Skin: No skin rash, easy bruising or bleeding or hives Endoc:  No polyuria, polydipsia , polyphagia or weight change Other:  All other systems negative  Allergies:  Review of patient's allergies indicates no known allergies.  Physical Examination:  VS: BP 150/92 mmHg  Pulse 90  Ht 5\' 5"  (1.651 m)  Wt 231 lb (104.781 kg)  BMI 38.44 kg/m2  SpO2 94%  General Appearance: No distress  HEENT: PERRLA, no ptosis, no other lesions noticed Pulmonary:normal breath sounds., diaphragmatic excursion normal.No wheezing, No rales   Cardiovascular:  Normal S1,S2.  No m/r/g.     Abdomen:Exam: Benign, Soft, non-tender, No masses  Skin:   warm, no rashes, no ecchymosis  Extremities: normal, no cyanosis, clubbing, warm with normal capillary refill.     Assessment and Plan: 70 year old female with chronic hypoxic respiratory failure on pulse dose O2, COPD, seen as an acute-care visit for COPD exacerbation today COPD (chronic obstructive pulmonary disease) COPD-Gold stage II Current COPD regiment include: 2 L oxygen at all times, Pulmicort nebulizer twice a day, rescue inhaler ( Xopenex), rescue nebulizer (albuterol). Patient has weaned herself down to 2 L of oxygen, currently on pulse dose, stop Spiriva due to financial restraints (not providing much critical benefit at this time, therefore this is okay). Advised patient to continue checking her saturations with 2 L, maintained saturations greater than 88% at all times.  Plan: - continue with 2L oxygen at all times, Pulmicort nebulizer and rescue inhaler  - 6 minute walk test and in office spirometry prior to follow-up visit - continue with exercise as tolerated.          Chronic respiratory failure Secondary to COPD. Currently on 2 L  of oxygen continuously. Completed ambulatory testing, on 2 L of supplemental oxygen, no desaturations on this level of oxygen below 88%.  Plan: -Continue with 2 L of supplemental oxygen continuously.         COPD exacerbation (HCC) Patient with acute COPD exacerbation today, as manifest by continuous use of albuterol and increasing cough. At this time will treat early with prednisone 20 mg daily 5 days. Patient advised to avoid all forms of tobacco and allergens. No improvement in 2-3 days, with prednisone, will then consider adding Z-Pak    Updated Medication List Outpatient Encounter Prescriptions as of 10/17/2015  Medication Sig  . albuterol (PROVENTIL HFA;VENTOLIN  HFA) 108 (90 BASE) MCG/ACT inhaler Inhale 2 puffs into the lungs every 6 (six) hours as needed for wheezing or shortness of breath.  . Apremilast 30 MG TABS Take 30 mg by mouth 2 (two) times daily.  Marland Kitchen. aspirin EC 81 MG tablet Take 1 tablet (81 mg total) by mouth daily.  . budesonide (PULMICORT) 0.5 MG/2ML nebulizer solution Take 0.5 mg by nebulization 2 (two) times daily.  . enalapril (VASOTEC) 20 MG tablet Take 1 tablet (20 mg total) by mouth 2 (two) times daily.  Marland Kitchen. glimepiride (AMARYL) 2 MG tablet Take 1 tablet (2 mg total) by mouth daily with breakfast.  . glucose blood (ACCU-CHEK COMPACT PLUS) test strip USE ONE STRIP TO CHECK GLUCOSE ONCE a day if needed  . omeprazole (PRILOSEC) 20 MG capsule Take 1 capsule (20 mg total) by mouth daily.  . pravastatin (PRAVACHOL) 80 MG tablet Take 1 tablet (80 mg total) by mouth daily.  . benzonatate (TESSALON) 100 MG capsule Take 1 capsule (100 mg total) by mouth every 8 (eight) hours as needed for cough.  . predniSONE (DELTASONE) 20 MG tablet Take 1 tablet (20 mg total) by mouth daily with breakfast.   No facility-administered encounter medications on file as of 10/17/2015.    Orders for this visit: No orders of the defined types were placed in this encounter.    Thank   you for the visitation and for allowing  [facility] to assist in the care of your patient. Our recommendations are noted above.  Please contact us if we can be of further service.  Stephanie AcreVishal Stacey Maura, MD [facility] Office Number: [phone]

## 2015-10-17 NOTE — Assessment & Plan Note (Signed)
COPD-Gold stage II Current COPD regiment include: 2 L oxygen at all times, Pulmicort nebulizer twice a day, rescue inhaler ( Xopenex), rescue nebulizer (albuterol). Patient has weaned herself down to 2 L of oxygen, currently on pulse dose, stop Spiriva due to financial restraints (not providing much critical benefit at this time, therefore this is okay). Advised patient to continue checking her saturations with 2 L, maintained saturations greater than 88% at all times.  Plan: - continue with 2L oxygen at all times, Pulmicort nebulizer and rescue inhaler  - 6 minute walk test and in office spirometry prior to follow-up visit - continue with exercise as tolerated.

## 2015-10-17 NOTE — Assessment & Plan Note (Addendum)
Secondary to COPD. Currently on 2 L of oxygen continuously. Completed ambulatory testing, on 2 L of supplemental oxygen, no desaturations on this level of oxygen below 88%.  Plan: -Continue with 2 L of supplemental oxygen continuously.

## 2015-10-21 ENCOUNTER — Telehealth: Payer: Self-pay | Admitting: Internal Medicine

## 2015-10-21 MED ORDER — AZITHROMYCIN 250 MG PO TABS: ORAL_TABLET | ORAL | Status: AC

## 2015-10-21 NOTE — Telephone Encounter (Signed)
Sore throat, sneezing, fever of 101, NP cough. Pt states you had mentioned if she was no better you would give her abx. Please advise.

## 2015-10-21 NOTE — Telephone Encounter (Signed)
Pt informed and RX sent to pharmacy. Nothing further needed. 

## 2015-10-21 NOTE — Telephone Encounter (Signed)
Please start patient on Zpak. If no improvement in 2 days or getting worst, then acute care visit.   Thank you

## 2015-10-21 NOTE — Telephone Encounter (Signed)
pt calling stating she was told to call us and tell us if she was not better, for we would call in some antibiotics. Please advise.

## 2015-10-26 DIAGNOSIS — J449 Chronic obstructive pulmonary disease, unspecified: Secondary | ICD-10-CM | POA: Diagnosis not present

## 2015-11-26 DIAGNOSIS — J449 Chronic obstructive pulmonary disease, unspecified: Secondary | ICD-10-CM | POA: Diagnosis not present

## 2015-12-27 DIAGNOSIS — J449 Chronic obstructive pulmonary disease, unspecified: Secondary | ICD-10-CM | POA: Diagnosis not present

## 2016-01-06 ENCOUNTER — Other Ambulatory Visit: Payer: Self-pay | Admitting: Family Medicine

## 2016-01-06 DIAGNOSIS — Z5181 Encounter for therapeutic drug level monitoring: Secondary | ICD-10-CM

## 2016-01-06 DIAGNOSIS — E1121 Type 2 diabetes mellitus with diabetic nephropathy: Secondary | ICD-10-CM | POA: Diagnosis not present

## 2016-01-06 DIAGNOSIS — E785 Hyperlipidemia, unspecified: Secondary | ICD-10-CM

## 2016-01-06 DIAGNOSIS — E119 Type 2 diabetes mellitus without complications: Secondary | ICD-10-CM

## 2016-01-07 NOTE — Assessment & Plan Note (Signed)
Check lipids 

## 2016-01-07 NOTE — Telephone Encounter (Signed)
Pt.notified

## 2016-01-07 NOTE — Assessment & Plan Note (Signed)
Check a1c 

## 2016-01-07 NOTE — Telephone Encounter (Signed)
Please let patient know that labs are due We had hoped to check her fasting cholesterol and liver test 6 weeks after she increased the pravastatin I sent one month in, but please ask her to get labs done this week Thank you

## 2016-01-07 NOTE — Assessment & Plan Note (Addendum)
Check sgpt, bmp

## 2016-01-09 ENCOUNTER — Other Ambulatory Visit: Payer: Self-pay

## 2016-01-09 DIAGNOSIS — E119 Type 2 diabetes mellitus without complications: Secondary | ICD-10-CM

## 2016-01-09 DIAGNOSIS — E785 Hyperlipidemia, unspecified: Secondary | ICD-10-CM

## 2016-01-09 DIAGNOSIS — Z5181 Encounter for therapeutic drug level monitoring: Secondary | ICD-10-CM | POA: Diagnosis not present

## 2016-01-10 LAB — LIPID PANEL
HDL: 56 mg/dL (ref 35–70)
LDL Cholesterol: 85 mg/dL
TRIGLYCERIDES: 106 mg/dL (ref 40–160)

## 2016-01-10 LAB — BASIC METABOLIC PANEL
Creatinine: 1 mg/dL (ref 0.5–1.1)
Glucose: 168 mg/dL
Sodium: 138 mmol/L (ref 137–147)

## 2016-01-10 LAB — HEPATIC FUNCTION PANEL: ALT: 15 U/L (ref 7–35)

## 2016-01-10 LAB — HEMOGLOBIN A1C: HEMOGLOBIN A1C: 7.5

## 2016-01-14 ENCOUNTER — Other Ambulatory Visit: Payer: Self-pay | Admitting: Family Medicine

## 2016-01-16 ENCOUNTER — Telehealth: Payer: Self-pay | Admitting: Family Medicine

## 2016-01-16 NOTE — Telephone Encounter (Signed)
I didn't get any lab results for her I see Dr. Carlynn PurlSowles ordered labs Forwarding

## 2016-01-16 NOTE — Telephone Encounter (Signed)
Pt needs results of labs from last week

## 2016-01-17 MED ORDER — OMEPRAZOLE 20 MG PO CPDR: 20.0000 mg | DELAYED_RELEASE_CAPSULE | Freq: Every day | ORAL | 1 refills | Status: DC

## 2016-01-17 MED ORDER — GLIMEPIRIDE 2 MG PO TABS: 2.0000 mg | ORAL_TABLET | Freq: Every day | ORAL | 1 refills | Status: DC

## 2016-01-17 MED ORDER — PRAVASTATIN SODIUM 80 MG PO TABS: 80.0000 mg | ORAL_TABLET | Freq: Every day | ORAL | 1 refills | Status: DC

## 2016-01-17 MED ORDER — ENALAPRIL MALEATE 20 MG PO TABS: 20.0000 mg | ORAL_TABLET | Freq: Two times a day (BID) | ORAL | 1 refills | Status: DC

## 2016-01-17 NOTE — Telephone Encounter (Signed)
I have never seen this patient. It was ordered by Asher MuirJamie, I think it resulted under my name by mistake.

## 2016-01-17 NOTE — Telephone Encounter (Signed)
I seriously doubt the creatinine of 12 is real; would have expected a panic level to have been called to provider on call I called [facility] personally and spoke to customer service The BMP I have is missing the BUN (only the glucose, creatinine and sodium resulted) I spoke with [facility] staff BUN is 12, not the creatinine Creatinine is 1.04 Total chol 162 (that was also missing) LDL is much improved, down from 123 to 85 ALT is normal She did not tolerate metformin Does get low blood sugars, felt shaky at 71; she does not want to increase it Really work on diet She requested 90 days; sent 4 Rxs to pharmacy

## 2016-01-23 ENCOUNTER — Encounter: Payer: Self-pay | Admitting: Family Medicine

## 2016-01-26 DIAGNOSIS — J449 Chronic obstructive pulmonary disease, unspecified: Secondary | ICD-10-CM | POA: Diagnosis not present

## 2016-01-27 ENCOUNTER — Ambulatory Visit (INDEPENDENT_AMBULATORY_CARE_PROVIDER_SITE_OTHER): Payer: Medicare Other

## 2016-01-27 DIAGNOSIS — Z23 Encounter for immunization: Secondary | ICD-10-CM | POA: Diagnosis not present

## 2016-02-18 ENCOUNTER — Ambulatory Visit
Admission: RE | Admit: 2016-02-18 | Discharge: 2016-02-18 | Disposition: A | Discharge status: Home / Self Care | Payer: Medicare Other | Source: Ambulatory Visit | Attending: Family Medicine | Admitting: Family Medicine

## 2016-02-18 ENCOUNTER — Encounter: Payer: Self-pay | Admitting: Family Medicine

## 2016-02-18 ENCOUNTER — Ambulatory Visit (INDEPENDENT_AMBULATORY_CARE_PROVIDER_SITE_OTHER): Payer: Medicare Other | Admitting: Family Medicine

## 2016-02-18 DIAGNOSIS — R062 Wheezing: Secondary | ICD-10-CM | POA: Diagnosis not present

## 2016-02-18 DIAGNOSIS — J449 Chronic obstructive pulmonary disease, unspecified: Secondary | ICD-10-CM | POA: Insufficient documentation

## 2016-02-18 DIAGNOSIS — R0602 Shortness of breath: Secondary | ICD-10-CM | POA: Diagnosis not present

## 2016-02-18 DIAGNOSIS — R918 Other nonspecific abnormal finding of lung field: Secondary | ICD-10-CM | POA: Insufficient documentation

## 2016-02-18 DIAGNOSIS — J069 Acute upper respiratory infection, unspecified: Secondary | ICD-10-CM | POA: Diagnosis not present

## 2016-02-18 DIAGNOSIS — J0100 Acute maxillary sinusitis, unspecified: Secondary | ICD-10-CM | POA: Insufficient documentation

## 2016-02-18 DIAGNOSIS — R05 Cough: Secondary | ICD-10-CM | POA: Diagnosis not present

## 2016-02-18 MED ORDER — AZITHROMYCIN 250 MG PO TABS: ORAL_TABLET | ORAL | 0 refills | Status: DC

## 2016-02-18 MED ORDER — BENZONATATE 100 MG PO CAPS: 100.0000 mg | ORAL_CAPSULE | Freq: Three times a day (TID) | ORAL | 0 refills | Status: DC | PRN

## 2016-02-18 NOTE — Progress Notes (Signed)
Name: Julie Keith   MRN: [id number]    DOB: [birth year]   Date:02/18/2016       Progress Note  Subjective  Chief Complaint  Chief Complaint  Patient presents with  . URI    cough, congested. low grade fever for 4 days  This patient is followed by Dr. Thana AtesMorrisey, neutropenia  URI   This is a new problem. The current episode started in the past 7 days (on Saturday morning, woke up with a sore throat and sneezing.). There has been no fever. Associated symptoms include congestion, coughing, sneezing, a sore throat and wheezing. Pertinent negatives include no chest pain, nausea or vomiting. She has tried nothing for the symptoms.     Past Medical History:  Diagnosis Date  . Allergic rhinitis   . Diabetes (HCC)   . Emphysema lung (HCC)   . GERD (gastroesophageal reflux disease) 10/10/2015  . Hypercholesteremia   . Hypertension   . Psoriasis 10/10/2015    Past Surgical History:  Procedure Laterality Date  . ABDOMINAL HYSTERECTOMY    . CERVICAL SPINE SURGERY    . CHOLECYSTECTOMY    . MASTECTOMY Bilateral     Family History  Problem Relation Age of Onset  . Leukemia Mother   . Pneumonia Father   . COPD Father     Social History   Social History  . Marital status: Married    Spouse name: N/A  . Number of children: N/A  . Years of education: N/A   Occupational History  . Not on file.   Social History Main Topics  . Smoking status: Former Smoker    Packs/day: 2.00    Years: 30.00    Types: Cigarettes  . Smokeless tobacco: Never Used     Comment: quit 2006  . Alcohol use No  . Drug use: No  . Sexual activity: Not Currently   Other Topics Concern  . Not on file   Social History Narrative  . No narrative on file     Current Outpatient Prescriptions:  .  albuterol (PROVENTIL HFA;VENTOLIN HFA) 108 (90 BASE) MCG/ACT inhaler, Inhale 2 puffs into the lungs every 6 (six) hours as needed for wheezing or shortness of breath., Disp: 1 Inhaler, Rfl: 2 .  Apremilast 30  MG TABS, Take 30 mg by mouth 2 (two) times daily., Disp: , Rfl:  .  aspirin EC 81 MG tablet, Take 1 tablet (81 mg total) by mouth daily., Disp: 30 tablet, Rfl: 11 .  budesonide (PULMICORT) 0.5 MG/2ML nebulizer solution, Take 0.5 mg by nebulization 2 (two) times daily., Disp: , Rfl:  .  enalapril (VASOTEC) 20 MG tablet, Take 1 tablet (20 mg total) by mouth 2 (two) times daily., Disp: 180 tablet, Rfl: 1 .  glimepiride (AMARYL) 2 MG tablet, Take 1 tablet (2 mg total) by mouth daily with breakfast., Disp: 90 tablet, Rfl: 1 .  glucose blood (ACCU-CHEK COMPACT PLUS) test strip, USE ONE STRIP TO CHECK GLUCOSE ONCE a day if needed, Disp: 100 each, Rfl: 0 .  omeprazole (PRILOSEC) 20 MG capsule, Take 1 capsule (20 mg total) by mouth daily., Disp: 90 capsule, Rfl: 1 .  pravastatin (PRAVACHOL) 80 MG tablet, Take 1 tablet (80 mg total) by mouth daily., Disp: 90 tablet, Rfl: 1 .  predniSONE (DELTASONE) 20 MG tablet, Take 1 tablet (20 mg total) by mouth daily with breakfast., Disp: 5 tablet, Rfl: 0 .  benzonatate (TESSALON) 100 MG capsule, Take 1 capsule (100 mg total) by mouth every 8 (  eight) hours as needed for cough., Disp: 21 capsule, Rfl: 0  No Known Allergies   Review of Systems  Constitutional: Positive for fever. Negative for chills.  HENT: Positive for congestion, sneezing and sore throat.   Respiratory: Positive for cough, sputum production, shortness of breath and wheezing.   Cardiovascular: Negative for chest pain.  Gastrointestinal: Negative for nausea and vomiting.    Objective  Vitals:   02/18/16 1324  BP: 130/80  Pulse: (!) 109  Resp: 16  Temp: 99.3 F (37.4 C)  TempSrc: Oral  SpO2: 95%  Weight: 228 lb 3.2 oz (103.5 kg)  Height: 5\' 5"  (1.651 m)    Physical Exam  Constitutional: She is well-developed, well-nourished, and in no distress.  HENT:  Right Ear: Tympanic membrane and ear canal normal.  Left Ear: Tympanic membrane and ear canal normal.  Nose: Right sinus exhibits  maxillary sinus tenderness. Left sinus exhibits maxillary sinus tenderness.  Mouth/Throat: Oropharynx is clear and moist.  Cardiovascular: Normal rate, regular rhythm, S1 normal, S2 normal and normal heart sounds.   No murmur heard. Pulmonary/Chest: Effort normal. No respiratory distress. She has wheezes in the right middle field, the right lower field, the left middle field and the left lower field.  Nursing note and vitals reviewed.    Assessment & Plan  1. Acute non-recurrent maxillary sinusitis  - azithromycin (ZITHROMAX) 250 MG tablet; 2 tabs po day 1, then 1 tab po q day x 4 days  Dispense: 6 tablet; Refill: 0  2. Wheezing on auscultation  - DG Chest 2 View; Future  3. URI with cough and congestion  - benzonatate (TESSALON) 100 MG capsule; Take 1 capsule (100 mg total) by mouth every 8 (eight) hours as needed for cough.  Dispense: 21 capsule; Refill: 0   [place]mage Teaster Asad A. Faylene KurtzShah [facility] 02/18/2016 1:36 PM

## 2016-02-26 DIAGNOSIS — J449 Chronic obstructive pulmonary disease, unspecified: Secondary | ICD-10-CM | POA: Diagnosis not present

## 2016-03-09 DIAGNOSIS — E119 Type 2 diabetes mellitus without complications: Secondary | ICD-10-CM | POA: Diagnosis not present

## 2016-03-27 DIAGNOSIS — J449 Chronic obstructive pulmonary disease, unspecified: Secondary | ICD-10-CM | POA: Diagnosis not present

## 2016-04-08 ENCOUNTER — Encounter: Payer: Self-pay | Admitting: Family Medicine

## 2016-04-08 ENCOUNTER — Ambulatory Visit (INDEPENDENT_AMBULATORY_CARE_PROVIDER_SITE_OTHER): Payer: Medicare Other | Admitting: Family Medicine

## 2016-04-08 DIAGNOSIS — E119 Type 2 diabetes mellitus without complications: Secondary | ICD-10-CM | POA: Diagnosis not present

## 2016-04-08 DIAGNOSIS — I10 Essential (primary) hypertension: Secondary | ICD-10-CM

## 2016-04-08 DIAGNOSIS — E782 Mixed hyperlipidemia: Secondary | ICD-10-CM | POA: Diagnosis not present

## 2016-04-08 DIAGNOSIS — Z5181 Encounter for therapeutic drug level monitoring: Secondary | ICD-10-CM | POA: Diagnosis not present

## 2016-04-08 DIAGNOSIS — E1121 Type 2 diabetes mellitus with diabetic nephropathy: Secondary | ICD-10-CM | POA: Diagnosis not present

## 2016-04-08 DIAGNOSIS — J431 Panlobular emphysema: Secondary | ICD-10-CM

## 2016-04-08 DIAGNOSIS — Z87891 Personal history of nicotine dependence: Secondary | ICD-10-CM

## 2016-04-08 HISTORY — DX: Personal history of nicotine dependence: Z87.891

## 2016-04-08 MED ORDER — GLUCOSE BLOOD VI STRP: ORAL_STRIP | 3 refills | Status: DC

## 2016-04-08 NOTE — Assessment & Plan Note (Signed)
Recommended chest CT for lung cancer screening; patient politely declines and wishes to wait on this for now

## 2016-04-08 NOTE — Progress Notes (Signed)
BP 124/74   Pulse 99   Temp 98.2 F (36.8 C) (Oral)   Resp 16   Wt 230 lb (104.3 kg)   SpO2 95%   BMI 38.27 kg/m    Subjective:    Patient ID: Julie Keith, female    DOB: 03/23/1946, 70 y.o.   MRN: [id number]  HPI: Julie Keith is a 70 y.o. female  Chief Complaint  Patient presents with  . Follow-up   She hurt the left knee turning on a carpet shampooer but it's getting better  Type 2 diabetes; her FSBS this morning was 117; no problems with feet; tingle just sometimes at night; has had some dry mouth but maybe from oxygen; last A1c in June was 7.4; checking FSBS 3x a day b/c it goes low sometimes  High cholesterol; trying to watch her diet and limit saturated fats; very few eggs, maybe one a week; no problems with cholesterol; fasting today for labs; last LDL 85, HDL 56 in September  HTN; well-controlled today; on ACE-I  Former smoker; quit years ago, maybe 2005  [facility] 2[facility] 04/08/2016 10/10/2015 03/05/2015  Decreased Interest 0 0 0  Down, Depressed, Hopeless 0 0 0  PHQ - 2 Score 0 0 0   Relevant past medical, surgical, family and social history reviewed Past Medical History:  Diagnosis Date  . Allergic rhinitis   . Diabetes (HCC)   . Emphysema lung (HCC)   . GERD (gastroesophageal reflux disease) 10/10/2015  . Hx of tobacco use, presenting hazards to health 04/08/2016  . Hypercholesteremia   . Hypertension   . Psoriasis 10/10/2015   Past Surgical History:  Procedure Laterality Date  . ABDOMINAL HYSTERECTOMY    . CERVICAL SPINE SURGERY    . CHOLECYSTECTOMY    . MASTECTOMY Bilateral    Family History  Problem Relation Age of Onset  . Leukemia Mother   . Pneumonia Father   . COPD Father    Social History  Substance Use Topics  . Smoking status: Former Smoker    Packs/day: 2.00    Years: 30.00    Types: Cigarettes  . Smokeless tobacco: Never Used     Comment: quit 2006  . Alcohol use No   Interim medical history since last visit  reviewed. Allergies and medications reviewed  Review of Systems Per HPI unless specifically indicated above     Objective:    BP 124/74   Pulse 99   Temp 98.2 F (36.8 C) (Oral)   Resp 16   Wt 230 lb (104.3 kg)   SpO2 95%   BMI 38.27 kg/m   Wt Readings from Last 3 Encounters:  04/08/16 230 lb (104.3 kg)  02/18/16 228 lb 3.2 oz (103.5 kg)  10/17/15 231 lb (104.8 kg)    Physical Exam  Constitutional: She appears well-developed and well-nourished. No distress.  obese  HENT:  Head: Normocephalic and atraumatic.  Eyes: EOM are normal. No scleral icterus.  Neck: No thyromegaly present.  Cardiovascular: Normal rate, regular rhythm and normal heart sounds.   No murmur heard. Pulmonary/Chest: Effort normal and breath sounds normal. No respiratory distress. She has no wheezes.  Abdominal: Soft. Bowel sounds are normal. She exhibits no distension.  Musculoskeletal: Normal range of motion. She exhibits no edema.  Mild crepitus; mild tenderness along medial joint line  Neurological: She is alert. She exhibits normal muscle tone.  Skin: Skin is warm and dry. She is not diaphoretic. No pallor.  Psychiatric: She has a  normal mood and affect. Her behavior is normal. Judgment and thought content normal.   Results for orders placed or performed in visit on 01/14/16  Basic metabolic panel  Result Value Ref Range   Glucose 168 mg/dL   Creatinine 1.0 0.5 - 1.1 mg/dL   Sodium 161138 096137 - 045147 mmol/L  Lipid panel  Result Value Ref Range   Triglycerides 106 40 - 160 mg/dL   HDL 56 35 - 70 mg/dL   LDL Cholesterol 85 mg/dL  Hepatic function panel  Result Value Ref Range   ALT 15 7 - 35 U/L  Hemoglobin A1c  Result Value Ref Range   Hemoglobin A1C 7.5       Assessment & Plan:   Problem List Items Addressed This Visit      Cardiovascular and Mediastinum   Essential hypertension    Excellent control; continue same; follow DASH guidelines and limit salt        Respiratory   COPD  (chronic obstructive pulmonary disease) (HCC)    On chronic oxygen, seeing pulmonologist        Endocrine   Type 2 diabetes mellitus without complication (HCC)    Check A1c today; foot exam looked good; pt to check her feet every night; eye exam UTD; limit sweets and white bread      Relevant Orders   Hemoglobin A1c   Microalbumin / creatinine urine ratio     Other   Medication monitoring encounter    Check liver and kidney function      Relevant Orders   Comprehensive metabolic panel   Hyperlipidemia    Limit saturated fats, check lipids today; continue statin      Hx of tobacco use, presenting hazards to health    Recommended chest CT for lung cancer screening; patient politely declines and wishes to wait on this for now          Follow up plan: Return in about 3 months (around 07/07/2016) for fasting labs and visit with Dr. Sherie DonLada.  An after-visit summary was printed and given to the patient at check-out.  Please see the patient instructions which may contain other information and recommendations beyond what is mentioned above in the assessment and plan.  Meds ordered this encounter  Medications  . glucose blood (ACCU-CHEK COMPACT PLUS) test strip    Sig: USE ONE STRIP TO CHECK GLUCOSE ONCE a day if needed    Dispense:  100 each    Refill:  3    ICD 10 E11.21    Orders Placed This Encounter  Procedures  . Hemoglobin A1c  . Microalbumin / creatinine urine ratio  . Comprehensive metabolic panel

## 2016-04-08 NOTE — Assessment & Plan Note (Signed)
Excellent control; continue same; follow DASH guidelines and limit salt

## 2016-04-08 NOTE — Assessment & Plan Note (Signed)
Limit saturated fats, check lipids today; continue statin

## 2016-04-08 NOTE — Patient Instructions (Addendum)
Okay to check FSBS once a day Please do see your eye doctor regularly, and have your eyes examined every year (or more often per his or her recommendation) Check your feet every night and let me know right away of any sores, infections, numbness, etc. Try to limit sweets, white bread, white rice, white potatoes It is okay with me for you to not check your fingerstick blood sugars (per [employer] Best Practices), unless you are interested and feel it would be helpful for you Try to limit saturated fats in your diet (bologna, hot dogs, barbeque, cheeseburgers, hamburgers, steak, bacon, sausage, cheese, etc.) and get more fresh fruits, vegetables, and whole grains Try some ice on the knee Check out the information at familydoctor.org entitled "Nutrition for Weight Loss: What You Need to Know about Fad Diets" Try to lose between 1-2 pounds per week by taking in fewer calories and burning off more calories You can succeed by limiting portions, limiting foods dense in calories and fat, becoming more active, and drinking 8 glasses of water a day (64 ounces) Don't skip meals, especially breakfast, as skipping meals may alter your metabolism Do not use over-the-counter weight loss pills or gimmicks that claim rapid weight loss A healthy BMI (or body mass index) is between 18.5 and 24.9 You can calculate your ideal BMI at the NIH website [employer].huhttp://www.nhlbi.nih.gov/health/educational/lose_wt/BMI/bmicalc.htm Avoid non-steroidal anti-inflammatories

## 2016-04-08 NOTE — Assessment & Plan Note (Signed)
Check liver and kidney function 

## 2016-04-08 NOTE — Assessment & Plan Note (Signed)
Check A1c today; foot exam looked good; pt to check her feet every night; eye exam UTD; limit sweets and white bread

## 2016-04-08 NOTE — Assessment & Plan Note (Signed)
On chronic oxygen, seeing pulmonologist

## 2016-04-17 ENCOUNTER — Telehealth: Payer: Self-pay | Admitting: Family Medicine

## 2016-04-17 MED ORDER — PREDNISONE 20 MG PO TABS: ORAL_TABLET | ORAL | 0 refills | Status: AC

## 2016-04-17 NOTE — Telephone Encounter (Signed)
Pt.notified

## 2016-04-17 NOTE — Telephone Encounter (Signed)
PT IS ASKING IF DR LADA WILL CALL HER IN AN ANTIOBOTIC. SHE WAS JUST HER LAST WEEK. SHE IS STILL COUGHING  AND IS NOT ABLE TO GET ANYTHING UP. THIS HAPPENED CHRISTMAS EVE . ASKING THAT SOMEONE CALL HER FOR SHE SAYS THAT IT USUALLY GOES INTO BRONCHITIS FOR SHE HAS COPD.

## 2016-04-17 NOTE — Telephone Encounter (Signed)
I reviewed Dr. Courtney ParisMungal's note from June Will treat the same Prednisone taper, Rx sent Have her avoid all tobacco and allergens If not improving after a few more days, consider antibiotics Since long weekend, advise her to go to urgent care for evaluation and xray if needed if not better with prednisone

## 2016-04-27 DIAGNOSIS — J449 Chronic obstructive pulmonary disease, unspecified: Secondary | ICD-10-CM | POA: Diagnosis not present

## 2016-05-28 DIAGNOSIS — J449 Chronic obstructive pulmonary disease, unspecified: Secondary | ICD-10-CM | POA: Diagnosis not present

## 2016-06-25 DIAGNOSIS — J449 Chronic obstructive pulmonary disease, unspecified: Secondary | ICD-10-CM | POA: Diagnosis not present

## 2016-07-08 ENCOUNTER — Ambulatory Visit: Payer: Medicare Other | Admitting: Family Medicine

## 2016-07-08 ENCOUNTER — Telehealth: Payer: Self-pay | Admitting: Family Medicine

## 2016-07-08 NOTE — Telephone Encounter (Signed)
Pt would like to know if you are able to re-certify her for her oxygen. The doctor that normally does it (DR Mongal[place]) has moved out of this area to [place]. [phone]

## 2016-07-08 NOTE — Telephone Encounter (Signed)
She should be seeing a pulmonologist; please contact [place] and see if another doctor will take over her care from Dr. Dema SeverinMungal; thank you

## 2016-07-09 ENCOUNTER — Telehealth: Payer: Self-pay | Admitting: Pulmonary Disease

## 2016-07-09 NOTE — Telephone Encounter (Signed)
Bjorn LoserRhonda spoke with patient and informed her that anyone from that doctor office will be able to handle whatever it is patient is needing. Dr Dema SeverinMungal office number is  [phone]

## 2016-07-09 NOTE — Telephone Encounter (Signed)
Pt states she needs re certification for her oxygen. Please call.

## 2016-07-10 NOTE — Telephone Encounter (Signed)
Pt has to be seen by a provider and will need a walk. Martie LeeSabrina is scheduling an appt with next available provider. Nothing further needed.

## 2016-07-26 DIAGNOSIS — J449 Chronic obstructive pulmonary disease, unspecified: Secondary | ICD-10-CM | POA: Diagnosis not present

## 2016-07-29 ENCOUNTER — Ambulatory Visit (INDEPENDENT_AMBULATORY_CARE_PROVIDER_SITE_OTHER): Payer: Medicare Other | Admitting: Family Medicine

## 2016-07-29 ENCOUNTER — Encounter: Payer: Self-pay | Admitting: Family Medicine

## 2016-07-29 VITALS — BP 138/82 | HR 89 | Temp 98.5°F | Resp 14 | Wt 227.6 lb

## 2016-07-29 DIAGNOSIS — I10 Essential (primary) hypertension: Secondary | ICD-10-CM

## 2016-07-29 DIAGNOSIS — E119 Type 2 diabetes mellitus without complications: Secondary | ICD-10-CM

## 2016-07-29 DIAGNOSIS — E782 Mixed hyperlipidemia: Secondary | ICD-10-CM | POA: Diagnosis not present

## 2016-07-29 DIAGNOSIS — L409 Psoriasis, unspecified: Secondary | ICD-10-CM

## 2016-07-29 DIAGNOSIS — I4891 Unspecified atrial fibrillation: Secondary | ICD-10-CM | POA: Insufficient documentation

## 2016-07-29 DIAGNOSIS — R079 Chest pain, unspecified: Secondary | ICD-10-CM | POA: Diagnosis not present

## 2016-07-29 DIAGNOSIS — J431 Panlobular emphysema: Secondary | ICD-10-CM

## 2016-07-29 DIAGNOSIS — I499 Cardiac arrhythmia, unspecified: Secondary | ICD-10-CM

## 2016-07-29 DIAGNOSIS — Z5181 Encounter for therapeutic drug level monitoring: Secondary | ICD-10-CM

## 2016-07-29 LAB — TSH: TSH: 1.45 m[IU]/L

## 2016-07-29 LAB — COMPLETE METABOLIC PANEL WITH GFR
ALBUMIN: 4.1 g/dL (ref 3.6–5.1)
ALK PHOS: 87 U/L (ref 33–130)
ALT: 9 U/L (ref 6–29)
AST: 14 U/L (ref 10–35)
BUN: 13 mg/dL (ref 7–25)
CALCIUM: 9.7 mg/dL (ref 8.6–10.4)
CO2: 30 mmol/L (ref 20–31)
Chloride: 98 mmol/L (ref 98–110)
Creat: 1.22 mg/dL — ABNORMAL HIGH (ref 0.60–0.93)
GFR, EST NON AFRICAN AMERICAN: 44 mL/min — AB (ref >=60)
GFR, Est African American: 51 mL/min — ABNORMAL LOW (ref >=60)
GLUCOSE: 160 mg/dL — AB (ref 65–99)
POTASSIUM: 5 mmol/L (ref 3.5–5.3)
SODIUM: 139 mmol/L (ref 135–146)
Total Bilirubin: 0.5 mg/dL (ref 0.2–1.2)
Total Protein: 7.5 g/dL (ref 6.1–8.1)

## 2016-07-29 LAB — CBC WITH DIFFERENTIAL/PLATELET
BASOS PCT: 0 %
Basophils Absolute: 0 cells/uL (ref 0–200)
Eosinophils Absolute: 109 cells/uL (ref 15–500)
Eosinophils Relative: 1 %
HCT: 42.4 % (ref 35.0–45.0)
Hemoglobin: 13.1 g/dL (ref 11.7–15.5)
LYMPHS PCT: 26 %
Lymphs Abs: 2834 cells/uL (ref 850–3900)
MCH: 26.9 pg — ABNORMAL LOW (ref 27.0–33.0)
MCHC: 30.9 g/dL — ABNORMAL LOW (ref 32.0–36.0)
MCV: 87.1 fL (ref 80.0–100.0)
MONOS PCT: 6 %
MPV: 9.6 fL (ref 7.5–12.5)
Monocytes Absolute: 654 cells/uL (ref 200–950)
Neutro Abs: 7303 cells/uL (ref 1500–7800)
Neutrophils Relative %: 67 %
PLATELETS: 418 10*3/uL — AB (ref 140–400)
RBC: 4.87 MIL/uL (ref 3.80–5.10)
RDW: 14.7 % (ref 11.0–15.0)
WBC: 10.9 10*3/uL — ABNORMAL HIGH (ref 3.8–10.8)

## 2016-07-29 LAB — MAGNESIUM: MAGNESIUM: 1.9 mg/dL (ref 1.5–2.5)

## 2016-07-29 LAB — LIPID PANEL
CHOLESTEROL: 178 mg/dL (ref <200)
HDL: 62 mg/dL (ref >50)
LDL Cholesterol: 96 mg/dL (ref <100)
TRIGLYCERIDES: 102 mg/dL (ref <150)
Total CHOL/HDL Ratio: 2.9 Ratio (ref <5.0)
VLDL: 20 mg/dL (ref <30)

## 2016-07-29 MED ORDER — ENALAPRIL MALEATE 20 MG PO TABS: 20.0000 mg | ORAL_TABLET | Freq: Two times a day (BID) | ORAL | 1 refills | Status: DC

## 2016-07-29 MED ORDER — RIVAROXABAN 20 MG PO TABS: 20.0000 mg | ORAL_TABLET | Freq: Every day | ORAL | 1 refills | Status: DC

## 2016-07-29 MED ORDER — GLIMEPIRIDE 2 MG PO TABS: 2.0000 mg | ORAL_TABLET | Freq: Every day | ORAL | 1 refills | Status: DC

## 2016-07-29 MED ORDER — PRAVASTATIN SODIUM 80 MG PO TABS: 80.0000 mg | ORAL_TABLET | Freq: Every day | ORAL | 1 refills | Status: DC

## 2016-07-29 MED ORDER — OMEPRAZOLE 20 MG PO CPDR: 20.0000 mg | DELAYED_RELEASE_CAPSULE | Freq: Every day | ORAL | 1 refills | Status: DC

## 2016-07-29 NOTE — Assessment & Plan Note (Signed)
Check A1c; foot exam by MD 

## 2016-07-29 NOTE — Progress Notes (Signed)
BP 138/82   Pulse 89   Temp 98.5 F (36.9 C) (Oral)   Resp 14   Wt 227 lb 9.6 oz (103.2 kg)   SpO2 95% Comment: 2L  BMI 37.87 kg/m    Subjective:    Patient ID: Julie Keith, female    DOB: [birth year], 70 y.o.   MRN: [id number]  HPI: Julie Keith is a 71 y.o. female  Chief Complaint  Patient presents with  . Follow-up   Patient is here for f/u  Diabetes mellitus type 2 Checking sugars?  yes How often? Mornings, 115 this morning Range (low to high) over last two weeks:  About 110 low, highest 130 Does patient feel additional teaching/training would be helpful?  no, has done the classes Trying to limit white bread, white rice, white potatoes, sweets?  yes Trying to limit sweetened drinks like iced tea, soft drinks, sports drinks, fruit juices?  yes, drinks unsweetened tea with splenda for years Checking feet every day/night?  yes; no problems except for some tingling at night; no painful burning Last eye exam:  May Nov, certainly UTD  High cholesterol Dietary intake: Do you eat more than 3 eggs per week  no Do you try to limit saturated fats in your diet?  yes Exercise: Does high cholesterol run in your family?   no Weight:  If overweight or obese, are you trying to lose weight?  yes  COPD; going to see pulmonologist in May; using oxygen ATC  Psoriasis, seeing dermatologist, Dr. Ebony Cargo  After listening to her and noted her irregularly irregular heart rhythm, I asked about chest pain; she says she gets fleeting moments of chest pain, lasting a few seconds; no associated nausea or [facility]  Depression screen [facility] 2/9 07/29/2016 04/08/2016 10/10/2015 03/05/2015  Decreased Interest 0 0 0 0  Down, Depressed, Hopeless 0 0 0 0  PHQ - 2 Score 0 0 0 0    Relevant past medical, surgical, family and social history reviewed Past Medical History:  Diagnosis Date  . Allergic rhinitis   . Diabetes (HCC)   . Emphysema lung (HCC)   . GERD (gastroesophageal reflux  disease) 10/10/2015  . Hx of tobacco use, presenting hazards to health 04/08/2016  . Hypercholesteremia   . Hypertension   . Psoriasis 10/10/2015   Past Surgical History:  Procedure Laterality Date  . ABDOMINAL HYSTERECTOMY    . CERVICAL SPINE SURGERY    . CHOLECYSTECTOMY    . MASTECTOMY Bilateral    Family History  Problem Relation Age of Onset  . Leukemia Mother   . Pneumonia Father   . COPD Father   . Diabetes Brother   . Cancer Brother     lung cancer  . Diabetes Sister    Social History  Substance Use Topics  . Smoking status: Former Smoker    Packs/day: 2.00    Years: 30.00    Types: Cigarettes  . Smokeless tobacco: Never Used     Comment: quit 2006  . Alcohol use No    Interim medical history since last visit reviewed. Allergies and medications reviewed  Review of Systems  Constitutional: Negative for unexpected weight change.  Cardiovascular: Positive for chest pain.   Per HPI unless specifically indicated above     Objective:    BP 138/82   Pulse 89   Temp 98.5 F (36.9 C) (Oral)   Resp 14   Wt 227 lb 9.6 oz (103.2 kg)   SpO2 95% Comment: 2L  BMI 37.87 kg/m   Wt Readings from Last 3 Encounters:  07/29/16 227 lb 9.6 oz (103.2 kg)  04/08/16 230 lb (104.3 kg)  02/18/16 228 lb 3.2 oz (103.5 kg)    Physical Exam  Constitutional: She appears well-developed and well-nourished. No distress.  Obese, but she has lost over 2 pounds since last visit  HENT:  Head: Normocephalic and atraumatic.  Eyes: EOM are normal. No scleral icterus.  Neck: No JVD present. No thyromegaly present.  Cardiovascular: Normal rate and normal heart sounds.  An irregularly irregular rhythm present.  No murmur heard. Pulmonary/Chest: Effort normal and breath sounds normal. No respiratory distress. She has no wheezes.  Abdominal: Soft. Bowel sounds are normal. She exhibits no distension.  Musculoskeletal: Normal range of motion. She exhibits no edema.  Mild crepitus; mild  tenderness along medial joint line  Neurological: She is alert. She exhibits normal muscle tone.  Skin: Skin is warm and dry. She is not diaphoretic. No pallor.  Psychiatric: She has a normal mood and affect. Her behavior is normal. Judgment and thought content normal.   Diabetic Foot Form - Detailed   Diabetic Foot Exam - detailed Diabetic Foot exam was performed with the following findings:  Yes 07/29/2016  8:31 AM  Visual Foot Exam completed.:  Yes  Are the toenails long?:  Yes (Comment: just a few) Are the toenails thick?:  No Are the toenails ingrown?:  No Normal Range of Motion:  Yes Pulse Foot Exam completed.:  Yes  Right Dorsalis Pedis:  Present Left Dorsalis Pedis:  Present  Sensory Foot Exam Completed.:  Yes Swelling:  No Semmes-Weinstein Monofilament Test R Site 1-Great Toe:  Pos L Site 1-Great Toe:  Pos  R Site 4:  Pos L Site 4:  Pos  R Site 5:  Pos L Site 5:  Pos    Comments:  Skin is dry    Results for orders placed or performed in visit on 01/14/16  Basic metabolic panel  Result Value Ref Range   Glucose 168 mg/dL   Creatinine 1.0 0.5 - 1.1 mg/dL   Sodium 161 096 - 045 mmol/L  Lipid panel  Result Value Ref Range   Triglycerides 106 40 - 160 mg/dL   HDL 56 35 - 70 mg/dL   LDL Cholesterol 85 mg/dL  Hepatic function panel  Result Value Ref Range   ALT 15 7 - 35 U/L  Hemoglobin A1c  Result Value Ref Range   Hemoglobin A1C 7.5       Assessment & Plan:   Problem List Items Addressed This Visit      Cardiovascular and Mediastinum   Essential hypertension    controlled      Relevant Medications   rivaroxaban (XARELTO) 20 MG TABS tablet   pravastatin (PRAVACHOL) 80 MG tablet   enalapril (VASOTEC) 20 MG tablet   Atrial fibrillation (HCC)    Brand new diagnosis; rate controlled; CHA2DS2-VASc score of 4 points; start Xarelto; refer to cardiologist; draw trop I, TSH, K+, Mg2+; discussed reasons to call 911; discussed stroke risk; she initially did not want  to see a cardiologist for evaluation, consideration of stress testing because of finances; we worked out that she'll postpone her pulmonology appointment, see cardiology instead and then save up for next co-pay for lung doctor in a month or two      Relevant Medications   rivaroxaban (XARELTO) 20 MG TABS tablet   pravastatin (PRAVACHOL) 80 MG tablet   enalapril (VASOTEC) 20 MG tablet  Other Relevant Orders   Ambulatory referral to Cardiology     Respiratory   COPD (chronic obstructive pulmonary disease) (HCC)    Will be seeing pulmonologist in May; continue ATC oxygen        Endocrine   Type 2 diabetes mellitus without complication (HCC) - Primary    Check A1c; foot exam by MD      Relevant Medications   pravastatin (PRAVACHOL) 80 MG tablet   glimepiride (AMARYL) 2 MG tablet   enalapril (VASOTEC) 20 MG tablet   Other Relevant Orders   Hemoglobin A1c   Lipid panel   Microalbumin / creatinine urine ratio     Musculoskeletal and Integument   Psoriasis    Managed by dermatologist        Other   Medication monitoring encounter    Check sgpt and Cr and K+      Relevant Orders   COMPLETE METABOLIC PANEL WITH GFR   Hyperlipidemia    Check lipids; goal LDL less than 70      Relevant Medications   rivaroxaban (XARELTO) 20 MG TABS tablet   pravastatin (PRAVACHOL) 80 MG tablet   enalapril (VASOTEC) 20 MG tablet   Other Relevant Orders   Lipid panel    Other Visit Diagnoses    Chest pain, unspecified type       Relevant Orders   EKG 12-Lead   CBC with Differential/Platelet   Troponin I   Ambulatory referral to Cardiology   Irregular heart rhythm       Relevant Orders   EKG 12-Lead   TSH   Magnesium   Ambulatory referral to Cardiology       Follow up plan: Return in about 4 weeks (around 08/26/2016).  An after-visit summary was printed and given to the patient at check-out.  Please see the patient instructions which may contain other information and  recommendations beyond what is mentioned above in the assessment and plan.  Meds ordered this encounter  Medications  . rivaroxaban (XARELTO) 20 MG TABS tablet    Sig: Take 1 tablet (20 mg total) by mouth daily with supper.    Dispense:  30 tablet    Refill:  1  . pravastatin (PRAVACHOL) 80 MG tablet    Sig: Take 1 tablet (80 mg total) by mouth daily.    Dispense:  90 tablet    Refill:  1  . omeprazole (PRILOSEC) 20 MG capsule    Sig: Take 1 capsule (20 mg total) by mouth daily.    Dispense:  90 capsule    Refill:  1  . glimepiride (AMARYL) 2 MG tablet    Sig: Take 1 tablet (2 mg total) by mouth daily with breakfast.    Dispense:  90 tablet    Refill:  1  . enalapril (VASOTEC) 20 MG tablet    Sig: Take 1 tablet (20 mg total) by mouth 2 (two) times daily.    Dispense:  180 tablet    Refill:  1    Orders Placed This Encounter  Procedures  . COMPLETE METABOLIC PANEL WITH GFR  . Hemoglobin A1c  . Lipid panel  . Microalbumin / creatinine urine ratio  . TSH  . Magnesium  . CBC with Differential/Platelet  . Troponin I  . Ambulatory referral to Cardiology  . EKG 12-Lead

## 2016-07-29 NOTE — Assessment & Plan Note (Signed)
Check lipids; goal LDL less than 70 

## 2016-07-29 NOTE — Assessment & Plan Note (Signed)
Managed by dermatologist 

## 2016-07-29 NOTE — Assessment & Plan Note (Signed)
Will be seeing pulmonologist in May; continue ATC oxygen

## 2016-07-29 NOTE — Patient Instructions (Addendum)
Start the Xarelto Stop the aspirin Please feel free to move your pulmonologist appointment back We'll have you see the cardiologist  Atrial Fibrillation Atrial fibrillation is a type of irregular or rapid heartbeat (arrhythmia). In atrial fibrillation, the heart quivers continuously in a chaotic pattern. This occurs when parts of the heart receive disorganized signals that make the heart unable to pump blood normally. This can increase the risk for stroke, heart failure, and other heart-related conditions. There are different types of atrial fibrillation, including:  Paroxysmal atrial fibrillation. This type starts suddenly, and it usually stops on its own shortly after it starts.  Persistent atrial fibrillation. This type often lasts longer than a week. It may stop on its own or with treatment.  Long-lasting persistent atrial fibrillation. This type lasts longer than 12 months.  Permanent atrial fibrillation. This type does not go away. Talk with your health care provider to learn about the type of atrial fibrillation that you have. What are the causes? This condition is caused by some heart-related conditions or procedures, including:  A heart attack.  Coronary artery disease.  Heart failure.  Heart valve conditions.  High blood pressure.  Inflammation of the sac that surrounds the heart (pericarditis).  Heart surgery.  Certain heart rhythm disorders, such as Wolf-Parkinson-White syndrome. Other causes include:  Pneumonia.  Obstructive sleep apnea.  Blockage of an artery in the lungs (pulmonary embolism, or PE).  Lung cancer.  Chronic lung disease.  Thyroid problems, especially if the thyroid is overactive (hyperthyroidism).  Caffeine.  Excessive alcohol use or illegal drug use.  Use of some medicines, including certain decongestants and diet pills. Sometimes, the cause cannot be found. What increases the risk? This condition is more likely to develop  in:  People who are older in age.  People who smoke.  People who have diabetes mellitus.  People who are overweight (obese).  Athletes who exercise vigorously. What are the signs or symptoms? Symptoms of this condition include:  A feeling that your heart is beating rapidly or irregularly.  A feeling of discomfort or pain in your chest.  Shortness of breath.  Sudden light-headedness or weakness.  Getting tired easily during exercise. In some cases, there are no symptoms. How is this diagnosed? Your health care provider may be able to detect atrial fibrillation when taking your pulse. If detected, this condition may be diagnosed with:  An electrocardiogram (ECG).  A Holter monitor test that records your heartbeat patterns over a 24-hour period.  Transthoracic echocardiogram (TTE) to evaluate how blood flows through your heart.  Transesophageal echocardiogram (TEE) to view more detailed images of your heart.  A stress test.  Imaging tests, such as a CT scan or chest X-ray.  Blood tests. How is this treated? The main goals of treatment are to prevent blood clots from forming and to keep your heart beating at a normal rate and rhythm. The type of treatment that you receive depends on many factors, such as your underlying medical conditions and how you feel when you are experiencing atrial fibrillation. This condition may be treated with:  Medicine to slow down the heart rate, bring the heart's rhythm back to normal, or prevent clots from forming.  Electrical cardioversion. This is a procedure that resets your heart's rhythm by delivering a controlled, low-energy shock to the heart through your skin.  Different types of ablation, such as catheter ablation, catheter ablation with pacemaker, or surgical ablation. These procedures destroy the heart tissues that send abnormal signals. When  the pacemaker is used, it is placed under your skin to help your heart beat in a regular  rhythm. Follow these instructions at home:  Take over-the counter and prescription medicines only as told by your health care provider.  If your health care provider prescribed a blood-thinning medicine (anticoagulant), take it exactly as told. Taking too much blood-thinning medicine can cause bleeding. If you do not take enough blood-thinning medicine, you will not have the protection that you need against stroke and other problems.  Do not use tobacco products, including cigarettes, chewing tobacco, and e-cigarettes. If you need help quitting, ask your health care provider.  If you have obstructive sleep apnea, manage your condition as told by your health care provider.  Do not drink alcohol.  Do not drink beverages that contain caffeine, such as coffee, soda, and tea.  Maintain a healthy weight. Do not use diet pills unless your health care provider approves. Diet pills may make heart problems worse.  Follow diet instructions as told by your health care provider.  Exercise regularly as told by your health care provider.  Keep all follow-up visits as told by your health care provider. This is important. How is this prevented?  Avoid drinking beverages that contain caffeine or alcohol.  Avoid certain medicines, especially medicines that are used for breathing problems.  Avoid certain herbs and herbal medicines, such as those that contain ephedra or ginseng.  Do not use illegal drugs, such as cocaine and amphetamines.  Do not smoke.  Manage your high blood pressure. Contact a health care provider if:  You notice a change in the rate, rhythm, or strength of your heartbeat.  You are taking an anticoagulant and you notice increased bruising.  You tire more easily when you exercise or exert yourself. Get help right away if:  You have chest pain, abdominal pain, sweating, or weakness.  You feel nauseous.  You notice blood in your vomit, bowel movement, or urine.  You have  shortness of breath.  You suddenly have swollen feet and ankles.  You feel dizzy.  You have sudden weakness or numbness of the face, arm, or leg, especially on one side of the body.  You have trouble speaking, trouble understanding, or both (aphasia).  Your face or your eyelid droops on one side. These symptoms may represent a serious problem that is an emergency. Do not wait to see if the symptoms will go away. Get medical help right away. Call your local emergency services (911 in the [place] Do not drive yourself to the hospital. This information is not intended to replace advice given to you by your health care provider. Make sure you discuss any questions you have with your health care provider. Document Released: 04/06/2005 Document Revised: 08/14/2015 Document Reviewed: 08/01/2014 Elsevier Interactive Patient Education  2017 [employer].

## 2016-07-29 NOTE — Assessment & Plan Note (Signed)
Check sgpt and Cr and K+ 

## 2016-07-29 NOTE — Assessment & Plan Note (Signed)
Brand new diagnosis; rate controlled; CHA2DS2-VASc score of 4 points; start Xarelto; refer to cardiologist; draw trop I, TSH, K+, Mg2+; discussed reasons to call 911; discussed stroke risk; she initially did not want to see a cardiologist for evaluation, consideration of stress testing because of finances; we worked out that she'll postpone her pulmonology appointment, see cardiology instead and then save up for next co-pay for lung doctor in a month or two

## 2016-07-29 NOTE — Assessment & Plan Note (Signed)
controlled 

## 2016-07-30 ENCOUNTER — Telehealth: Payer: Self-pay

## 2016-07-30 LAB — MICROALBUMIN / CREATININE URINE RATIO
Creatinine, Urine: 278 mg/dL (ref 20–320)
MICROALB/CREAT RATIO: 17 ug/mg{creat} (ref <30)
Microalb, Ur: 4.6 mg/dL

## 2016-07-30 LAB — HEMOGLOBIN A1C
HEMOGLOBIN A1C: 7.4 % — AB (ref <5.7)
Mean Plasma Glucose: 166 mg/dL

## 2016-07-30 LAB — TROPONIN I: Troponin I: 0.01 ng/mL (ref <=0.05)

## 2016-07-30 NOTE — Telephone Encounter (Signed)
Spoke with patient to schedule new patient appt from referral pcp.  Patient offered and declined appt for 07/30/16.    Patient also declined sooner appt on 08/04/16 as she has o2 delivery she cannot miss.  Patient scheduled with Dr. Alvino Chapel 08/11/16 at 10 am

## 2016-08-11 ENCOUNTER — Telehealth: Payer: Self-pay | Admitting: Family Medicine

## 2016-08-11 ENCOUNTER — Encounter: Payer: Self-pay | Admitting: Cardiology

## 2016-08-11 ENCOUNTER — Ambulatory Visit (INDEPENDENT_AMBULATORY_CARE_PROVIDER_SITE_OTHER): Payer: Medicare Other | Admitting: Cardiology

## 2016-08-11 ENCOUNTER — Ambulatory Visit (INDEPENDENT_AMBULATORY_CARE_PROVIDER_SITE_OTHER): Payer: Medicare Other

## 2016-08-11 VITALS — BP 158/60 | HR 86 | Ht 65.0 in | Wt 228.5 lb

## 2016-08-11 DIAGNOSIS — I491 Atrial premature depolarization: Secondary | ICD-10-CM | POA: Diagnosis not present

## 2016-08-11 DIAGNOSIS — R079 Chest pain, unspecified: Secondary | ICD-10-CM

## 2016-08-11 DIAGNOSIS — R0602 Shortness of breath: Secondary | ICD-10-CM

## 2016-08-11 DIAGNOSIS — I10 Essential (primary) hypertension: Secondary | ICD-10-CM | POA: Diagnosis not present

## 2016-08-11 MED ORDER — ASPIRIN EC 81 MG PO TBEC: 81.0000 mg | DELAYED_RELEASE_TABLET | Freq: Every day | ORAL | 3 refills | Status: DC

## 2016-08-11 NOTE — Patient Instructions (Addendum)
Medication Instructions:  Your physician has recommended you make the following change in your medication:  1. STOP taking Xarelto (Rivaroxaban) 2. START Aspirin 81 mg once daily   Testing/Procedures: Your physician has recommended that you wear an Zio monitor for 3 days. Zio monitors are medical devices that record the heart's electrical activity. Doctors most often [place] these monitors to diagnose arrhythmias. Arrhythmias are problems with the speed or rhythm of the heartbeat. The monitor is a small, portable device. You can wear one while you do your normal daily activities. This is usually used to diagnose what is causing palpitations/syncope (passing out).  Wear Monitor for 3 days and then take off and mail back on Friday August 14, 2016  Your physician has requested that you have an echocardiogram. Echocardiography is a painless test that uses sound waves to create images of your heart. It provides your doctor with information about the size and shape of your heart and how well your heart's chambers and valves are working. This procedure takes approximately one hour. There are no restrictions for this procedure.   [facility]  Your caregiver has ordered a Stress Test with nuclear imaging. The purpose of this test is to evaluate the blood supply to your heart muscle. This procedure is referred to as a "Non-Invasive Stress Test." This is because other than having an IV started in your vein, nothing is inserted or "invades" your body. Cardiac stress tests are done to find areas of poor blood flow to the heart by determining the extent of coronary artery disease (CAD). Some patients exercise on a treadmill, which naturally increases the blood flow to your heart, while others who are  unable to walk on a treadmill due to physical limitations have a pharmacologic/chemical stress agent called Lexiscan . This medicine will mimic walking on a treadmill by temporarily increasing your coronary blood flow.    Please note: these test may take anywhere between 2-4 hours to complete  PLEASE REPORT TO [facility] MEDICAL MALL ENTRANCE  THE VOLUNTEERS AT THE FIRST DESK WILL DIRECT YOU WHERE TO GO  Date of Procedure:_____________________________________  Arrival Time for Procedure:______________________________  Instructions regarding medication:   _X___ : Hold diabetes medication Glimepiride (Amaryl) the morning of procedure   PLEASE NOTIFY THE OFFICE AT LEAST 24 HOURS IN ADVANCE IF YOU ARE UNABLE TO KEEP YOUR APPOINTMENT.  [phone] AND  PLEASE NOTIFY NUCLEAR MEDICINE AT [facility] AT LEAST 24 HOURS IN ADVANCE IF YOU ARE UNABLE TO KEEP YOUR APPOINTMENT. [phone]  How to prepare for your Myoview test:  1. Do not eat or drink after midnight 2. No caffeine for 24 hours prior to test 3. No smoking 24 hours prior to test. 4. Your medication may be taken with water.  If your doctor stopped a medication because of this test, do not take that medication. 5. Ladies, please do not wear dresses.  Skirts or pants are appropriate. Please wear a short sleeve shirt. 6. No perfume, cologne or lotion. 7. Wear comfortable walking shoes. No heels!    Follow-Up: Your physician recommends that you schedule a follow-up appointment as needed. We will call you with results and if needed schedule follow up at that time.   It was a pleasure seeing you today here in the office. Please do not hesitate to give [place] a call back if you have any further questions. [phone]  Cliffside Cellar RN, BSN     Cardiac Event Monitoring A cardiac event monitor is a small recording device that  is used to detect abnormal heart rhythms (arrhythmias). The monitor is used to record your heart rhythm when you have symptoms, such as:  Fast heartbeats (palpitations), such as heart racing or fluttering.  Dizziness.  Fainting or light-headedness.  Unexplained weakness. Some monitors are wired to electrodes placed on your chest.  Electrodes are flat, sticky disks that attach to your skin. Other monitors may be hand-held or worn on the wrist. The monitor can be worn for up to 30 days. If the monitor is attached to your chest, a technician will prepare your chest for the electrode placement and show you how to work the monitor. Take time to practice using the monitor before you leave the office. Make sure you understand how to send the information from the monitor to your health care provider. In some cases, you may need to use a landline telephone instead of a cell phone. What are the risks? Generally, this device is safe to use, but it possible that the skin under the electrodes will become irritated. How to use your cardiac event monitor  Wear your monitor at all times, except when you are in water:  Do not let the monitor get wet.  Take the monitor off when you bathe. Do not swim or use a hot tub with it on.  Keep your skin clean. Do not put body lotion or moisturizer on your chest.  Change the electrodes as told by your health care provider or any time they stop sticking to your skin. You may need to use medical tape to keep them on.  Try to put the electrodes in slightly different places on your chest to help prevent skin irritation. They must remain in the area under your left breast and in the upper right section of your chest.  Make sure the monitor is safely clipped to your clothing or in a location close to your body that your health care provider recommends.  Press the button to record as soon as you feel heart-related symptoms, such as:  Dizziness.  Weakness.  Light-headedness.  Palpitations.  Thumping or pounding in your chest.  Shortness of breath.  Unexplained weakness.  Keep a diary of your activities, such as walking, doing chores, and taking medicine. It is very important to note what you were doing when you pushed the button to record your symptoms. This will help your health care  provider determine what might be contributing to your symptoms.  Send the recorded information as recommended by your health care provider. It may take some time for your health care provider to process the results.  Change the batteries as told by your health care provider.  Keep electronic devices away from your monitor. This includes:  Tablets.  MP3 players.  Cell phones.  While wearing your monitor you should avoid:  Electric blankets.  Firefighter.  Electric toothbrushes.  Microwave ovens.  Magnets.  Metal detectors. Get help right away if:  You have chest pain.  You have extreme difficulty breathing or shortness of breath.  You develop a very fast heartbeat that persists.  You develop dizziness that does not go away.  You faint or constantly feel like you are about to faint. Summary  A cardiac event monitor is a small recording device that is used to help detect abnormal heart rhythms (arrhythmias).  The monitor is used to record your heart rhythm when you have heart-related symptoms.  Make sure you understand how to send the information from the monitor to your  health care provider.  It is important to press the button on the monitor when you have any heart-related symptoms.  Keep a diary of your activities, such as walking, doing chores, and taking medicine. It is very important to note what you were doing when you pushed the button to record your symptoms. This will help your health care provider learn what might be causing your symptoms. This information is not intended to replace advice given to you by your health care provider. Make sure you discuss any questions you have with your health care provider. Document Released: 01/14/2008 Document Revised: 03/21/2016 Document Reviewed: 03/21/2016 Elsevier Interactive Patient Education  2017 [employer] Echocardiogram An echocardiogram, or echocardiography, uses sound waves (ultrasound) to produce an  image of your heart. The echocardiogram is simple, painless, obtained within a short period of time, and offers valuable information to your health care provider. The images from an echocardiogram can provide information such as:  Evidence of coronary artery disease (CAD).  Heart size.  Heart muscle function.  Heart valve function.  Aneurysm detection.  Evidence of a past heart attack.  Fluid buildup around the heart.  Heart muscle thickening.  Assess heart valve function. Tell a health care provider about:  Any allergies you have.  All medicines you are taking, including vitamins, herbs, eye drops, creams, and over-the-counter medicines.  Any problems you or family members have had with anesthetic medicines.  Any blood disorders you have.  Any surgeries you have had.  Any medical conditions you have.  Whether you are pregnant or may be pregnant. What happens before the procedure? No special preparation is needed. Eat and drink normally. What happens during the procedure?  In order to produce an image of your heart, gel will be applied to your chest and a wand-like tool (transducer) will be moved over your chest. The gel will help transmit the sound waves from the transducer. The sound waves will harmlessly bounce off your heart to allow the heart images to be captured in real-time motion. These images will then be recorded.  You may need an IV to receive a medicine that improves the quality of the pictures. What happens after the procedure? You may return to your normal schedule including diet, activities, and medicines, unless your health care provider tells you otherwise. This information is not intended to replace advice given to you by your health care provider. Make sure you discuss any questions you have with your health care provider. Document Released: 04/03/2000 Document Revised: 11/23/2015 Document Reviewed: 12/12/2012 Elsevier Interactive Patient Education   2017 [employer] Inc. Pharmacologic Stress Electrocardiogram Introduction A pharmacologic stress electrocardiogram is a heart (cardiac) test that uses nuclear imaging to evaluate the blood supply to your heart. This test may also be called a pharmacologic stress electrocardiography. Pharmacologic means that a medicine is used to increase your heart rate and blood pressure. This stress test is done to find areas of poor blood flow to the heart by determining the extent of coronary artery disease (CAD). Some people exercise on a treadmill, which naturally increases the blood flow to the heart. For those people unable to exercise on a treadmill, a medicine is used. This medicine stimulates your heart and will cause your heart to beat harder and more quickly, as if you were exercising. Pharmacologic stress tests can help determine:  The adequacy of blood flow to your heart during increased levels of activity in order to clear you for discharge home.  The extent of coronary artery  blockage caused by CAD.  Your prognosis if you have suffered a heart attack.  The effectiveness of cardiac procedures done, such as an angioplasty, which can increase the circulation in your coronary arteries.  Causes of chest pain or pressure. LET [facility] CARE PROVIDER KNOW ABOUT:  Any allergies you have.  All medicines you are taking, including vitamins, herbs, eye drops, creams, and over-the-counter medicines.  Previous problems you or members of your family have had with the use of anesthetics.  Any blood disorders you have.  Previous surgeries you have had.  Medical conditions you have.  Possibility of pregnancy, if this applies.  If you are currently breastfeeding. RISKS AND COMPLICATIONS Generally, this is a safe procedure. However, as with any procedure, complications can occur. Possible complications include:  You develop pain or pressure in the following areas:  Chest.  Jaw or neck.  Between  your shoulder blades.  Radiating down your left arm.  Headache.  Dizziness or light-headedness.  Shortness of breath.  Increased or irregular heartbeat.  Low blood pressure.  Nausea or vomiting.  Flushing.  Redness going up the arm and slight pain during injection of medicine.  Heart attack (rare). BEFORE THE PROCEDURE  Avoid all forms of caffeine for 24 hours before your test or as directed by your health care provider. This includes coffee, tea (even decaffeinated tea), caffeinated sodas, chocolate, cocoa, and certain pain medicines.  Follow your health care provider's instructions regarding eating and drinking before the test.  Take your medicines as directed at regular times with water unless instructed otherwise. Exceptions may include:  If you have diabetes, ask how you are to take your insulin or pills. It is common to adjust insulin dosing the morning of the test.  If you are taking beta-blocker medicines, it is important to talk to your health care provider about these medicines well before the date of your test. Taking beta-blocker medicines may interfere with the test. In some cases, these medicines need to be changed or stopped 24 hours or more before the test.  If you wear a nitroglycerin patch, it may need to be removed prior to the test. Ask your health care provider if the patch should be removed before the test.  If you use an inhaler for any breathing condition, bring it with you to the test.  If you are an outpatient, bring a snack so you can eat right after the stress phase of the test.  Do not smoke for 4 hours prior to the test or as directed by your health care provider.  Do not apply lotions, powders, creams, or oils on your chest prior to the test.  Wear comfortable shoes and clothing. Let your health care provider know if you were unable to complete or follow the preparations for your test. PROCEDURE  Multiple patches (electrodes) will be put  on your chest. If needed, small areas of your chest may be shaved to get better contact with the electrodes. Once the electrodes are attached to your body, multiple wires will be attached to the electrodes, and your heart rate will be monitored.  An IV access will be started. A nuclear trace (isotope) is given. The isotope may be given intravenously, or it may be swallowed. Nuclear refers to several types of radioactive isotopes, and the nuclear isotope lights up the arteries so that the nuclear images are clear. The isotope is absorbed by your body. This results in low radiation exposure.  A resting nuclear image is  taken to show how your heart functions at rest.  A medicine is given through the IV access.  A second scan is done about 1 hour after the medicine injection and determines how your heart functions under stress.  During this stress phase, you will be connected to an electrocardiogram machine. Your blood pressure and oxygen levels will be monitored. What to expect after the procedure  Your heart rate and blood pressure will be monitored after the test.  You may return to your normal schedule, including diet,activities, and medicines, unless your health care provider tells you otherwise. This information is not intended to replace advice given to you by your health care provider. Make sure you discuss any questions you have with your health care provider. Document Released: 08/23/2008 Document Revised: 09/12/2015 Document Reviewed: 10/14/2015 Elsevier Interactive Patient Education  2017 [employer].

## 2016-08-11 NOTE — Telephone Encounter (Signed)
Thank you :)

## 2016-08-11 NOTE — Telephone Encounter (Signed)
-----   Message from Almond Lint, MD sent at 08/11/2016 10:29 AM EDT ----- Hello! I reviewed her EKG from 07/29/2016. It looked more like sinus rhythm with PACs. Her EKG today showed sinus rhythm as well. For now, I have instructed her to hold off Xarelto. She can resume the aspirin 81 mg by mouth daily. We will set her up for a monitor.  We recommended a stress test, and an echocardiogram for complaints of chest pain, shortness of breath. She will likely hold off on scheduling these 2 tests for now because of financial issues. Just wanted to let you know.  Thanks, Karie Kirks

## 2016-08-11 NOTE — Progress Notes (Signed)
Cardiology Office Note   Date:  08/11/2016   ID:  Julie Keith 01-30-1946, MRN [id number]  Referring Doctor:  Baruch Gouty, MD   Cardiologist:   Almond Lint, MD   Reason for consultation:  Chief Complaint  Patient presents with  . other    Referred by Dr. Sherie Don, no hx of cariac isuues.  Pt c/o chest pain. Pt also states that she has been told she has a heart murmur. Last EKG shows A. Fib.   Meds verbally reviewed by patient.       History of Present Illness: Julie Keith is a 71 y.o. female who is being seen today for the evaluation of possible atrial fibrillation at the request of Lada, Janit Bern, MD.  Patient reported chest pain, onset 4 weeks ago. She describes it as an aching sensation on the left side of the chest. Mild in severity, nonradiating, randomly occurring, occurred all of a sudden. Lasts a few seconds at a time. Noted when she was at rest on a recliner. This complaints prompted doing an EKG on 07/29/2016 at PCPs office. I personally reviewed his EKG and showed sinus rhythm with PACs. The machine read it as atrial fibrillation, and hence prompted this consultation.  Patient does not report any palpitations. Shortness of breath is chronic and she thinks is from her COPD. She has been on oxygen for 7 years, 2 L 24/7. She is due for a follow-up with her pulmonologist.  In terms of hypertension, her blood pressure at home is usually in the 120s to 130s.  Patient does not have any PND, orthopnea, edema. No syncope.  ROS:  Please see the history of present illness. Aside from mentioned under HPI, all other systems are reviewed and negative.     Past Medical History:  Diagnosis Date  . Allergic rhinitis   . Diabetes (HCC)   . Emphysema lung (HCC)   . GERD (gastroesophageal reflux disease) 10/10/2015  . Hx of tobacco use, presenting hazards to health 04/08/2016  . Hypercholesteremia   . Hypertension   . Psoriasis 10/10/2015    Past Surgical History:    Procedure Laterality Date  . ABDOMINAL HYSTERECTOMY    . CERVICAL SPINE SURGERY    . CHOLECYSTECTOMY    . MASTECTOMY Bilateral      reports that she has quit smoking. Her smoking use included Cigarettes. She has a 60.00 pack-year smoking history. She has never used smokeless tobacco. She reports that she does not drink alcohol or use drugs.   family history includes COPD in her father; Cancer in her brother; Diabetes in her brother and sister; Leukemia in her mother; Pneumonia in her father.   Outpatient Medications Prior to Visit  Medication Sig Dispense Refill  . albuterol (PROVENTIL HFA;VENTOLIN HFA) 108 (90 BASE) MCG/ACT inhaler Inhale 2 puffs into the lungs every 6 (six) hours as needed for wheezing or shortness of breath. 1 Inhaler 2  . Apremilast 30 MG TABS Take 30 mg by mouth 2 (two) times daily.    . budesonide (PULMICORT) 0.5 MG/2ML nebulizer solution Take 0.5 mg by nebulization as needed.     . enalapril (VASOTEC) 20 MG tablet Take 1 tablet (20 mg total) by mouth 2 (two) times daily. 180 tablet 1  . glimepiride (AMARYL) 2 MG tablet Take 1 tablet (2 mg total) by mouth daily with breakfast. 90 tablet 1  . glucose blood (ACCU-CHEK COMPACT PLUS) test strip USE ONE STRIP TO CHECK GLUCOSE ONCE a  day if needed 100 each 3  . omeprazole (PRILOSEC) 20 MG capsule Take 1 capsule (20 mg total) by mouth daily. 90 capsule 1  . pravastatin (PRAVACHOL) 80 MG tablet Take 1 tablet (80 mg total) by mouth daily. 90 tablet 1  . rivaroxaban (XARELTO) 20 MG TABS tablet Take 1 tablet (20 mg total) by mouth daily with supper. 30 tablet 1   No facility-administered medications prior to visit.      Allergies: Patient has no known allergies.    PHYSICAL EXAM: VS:  BP (!) 158/60 (BP Location: Right Arm, Patient Position: Sitting, Cuff Size: Normal)   Pulse 86   Ht  (1.651 m)   Wt 228 lb 8 oz (103.6 kg)   BMI 38.02 kg/m  , Body mass index is 38.02 kg/m. Wt Readings from Last 3 Encounters:   08/11/16 228 lb 8 oz (103.6 kg)  07/29/16 227 lb 9.6 oz (103.2 kg)  04/08/16 230 lb (104.3 kg)    GENERAL:  well developed, well nourished, obese, not in acute distress HEENT: normocephalic, pink conjunctivae, anicteric sclerae, no xanthelasma, normal dentition, oropharynx clear NECK:  no neck vein engorgement, JVP normal, no hepatojugular reflux, carotid upstroke brisk and symmetric, no bruit, no thyromegaly, no lymphadenopathy LUNGS:  good respiratory effort, clear to auscultation bilaterally CV:  PMI not displaced, no thrills, no lifts, S1 and S2 within normal limits, no palpable S3 or S4, no murmurs, no rubs, no gallops ABD:  Soft, nontender, nondistended, normoactive bowel sounds, no abdominal aortic bruit, no hepatomegaly, no splenomegaly MS: nontender back, no kyphosis, no scoliosis, no joint deformities EXT:  2+ DP/PT pulses, no edema, no varicosities, no cyanosis, no clubbing SKIN: warm, nondiaphoretic, normal turgor, no ulcers NEUROPSYCH: alert, oriented to person, place, and time, sensory/motor grossly intact, normal mood, appropriate affect  Recent Labs: 07/29/2016: ALT 9; BUN 13; Creat 1.22; Hemoglobin 13.1; Magnesium 1.9; Platelets 418; Potassium 5.0; Sodium 139; TSH 1.45   Lipid Panel    Component Value Date/Time   CHOL 178 07/29/2016 0842   CHOL 205 (H) 03/13/2015 0932   TRIG 102 07/29/2016 0842   HDL 62 07/29/2016 0842   HDL 52 03/13/2015 0932   CHOLHDL 2.9 07/29/2016 0842   VLDL 20 07/29/2016 0842   LDLCALC 96 07/29/2016 0842   LDLCALC 129 (H) 03/13/2015 0932     Other studies Reviewed:  EKG:  The ekg from04/24/2018 was personally reviewed by me and it revealed sinus rhythm, sinus arrhythmia, 86 BPM.  EKG from 07/29/2016 was personally reviewed by me and it showed sinus rhythm with PACs.  Additional studies/ records that were reviewed personally reviewed by me today include: None available   ASSESSMENT AND PLAN: PACs Question of atrial fibrillation No  evidence of atrial fibrillation on 07/30/1998 EKG as well as today's EKG. Recommend to hold off on Xarelto, and can resume ASA 81 mg by mouth daily which she takes for history of hypertension. Recommend monitor. Recommend echocardiogram.  Chest pain, SOB Atypical however patient has risk factors for CAD including age, hypertension, history of smoking. Recommend pharmacologic nuclear stress test.  Hypertension BP is well controlled at home. Continue monitoring BP. Continue current medical therapy and lifestyle changes.  Patient will likely proceed with monitor. She has limited funds and would like to hold off on other cardiac testing. We have discussed the recommendations and the reasons behind each cardiac test. In order of priority, 1. Stress test and then 2. Echo.     Current medicines are reviewed  at length with the patient today.  The patient does not have concerns regarding medicines.  Labs/ tests ordered today include:  Orders Placed This Encounter  Procedures  . NM Myocar Multi W/Spect W/Wall Motion / EF  . LONG TERM MONITOR (3-14 DAYS)  . EKG 12-Lead  . ECHOCARDIOGRAM COMPLETE    I had a lengthy and detailed discussion with the patient regarding diagnoses, prognosis, diagnostic options.  I counseled the patient on importance of lifestyle modification including heart healthy diet, regular physical activity once cardiac work up is completed.   Disposition:   FU with Cardiology after tests   Thank you for this consultation. We will forwarding this consultation to referring physician.   Signed, Almond Lint, MD  08/11/2016 11:03 AM    [facility]  This note was generated in part with voice recognition software and I apologize for any typographical errors that were not detected and corrected.

## 2016-08-13 ENCOUNTER — Telehealth: Payer: Self-pay | Admitting: Cardiology

## 2016-08-13 NOTE — Telephone Encounter (Signed)
Called pt to schedule her echocardiogram. Pt states she does not want to have this done.

## 2016-08-13 NOTE — Telephone Encounter (Signed)
Spoke with patient and remembered that we discussed during her appointment that due to finances she needs to wait on scheduling her echocardiogram and stress test. Let her know that I would discontinue those orders for now and to call back when she is ready to schedule. She was appreciative for the call and had no further questions at this time.

## 2016-08-14 DIAGNOSIS — R0602 Shortness of breath: Secondary | ICD-10-CM | POA: Diagnosis not present

## 2016-08-14 DIAGNOSIS — R079 Chest pain, unspecified: Secondary | ICD-10-CM | POA: Diagnosis not present

## 2016-08-14 DIAGNOSIS — I491 Atrial premature depolarization: Secondary | ICD-10-CM | POA: Diagnosis not present

## 2016-08-19 DIAGNOSIS — R079 Chest pain, unspecified: Secondary | ICD-10-CM | POA: Diagnosis not present

## 2016-08-20 ENCOUNTER — Encounter: Payer: Self-pay | Admitting: Internal Medicine

## 2016-08-20 ENCOUNTER — Ambulatory Visit (INDEPENDENT_AMBULATORY_CARE_PROVIDER_SITE_OTHER): Payer: Medicare Other | Admitting: Internal Medicine

## 2016-08-20 ENCOUNTER — Telehealth: Payer: Self-pay | Admitting: *Deleted

## 2016-08-20 VITALS — BP 138/70 | HR 60 | Resp 16 | Ht 65.0 in | Wt 227.0 lb

## 2016-08-20 DIAGNOSIS — J9611 Chronic respiratory failure with hypoxia: Secondary | ICD-10-CM

## 2016-08-20 DIAGNOSIS — J441 Chronic obstructive pulmonary disease with (acute) exacerbation: Secondary | ICD-10-CM

## 2016-08-20 MED ORDER — METOPROLOL SUCCINATE ER 25 MG PO TB24: 25.0000 mg | ORAL_TABLET | Freq: Every day | ORAL | 3 refills | Status: DC

## 2016-08-20 MED ORDER — UMECLIDINIUM-VILANTEROL 62.5-25 MCG/INH IN AEPB: 1.0000 | INHALATION_SPRAY | Freq: Every day | RESPIRATORY_TRACT | 0 refills | Status: DC

## 2016-08-20 MED ORDER — UMECLIDINIUM-VILANTEROL 62.5-25 MCG/INH IN AEPB: 1.0000 | INHALATION_SPRAY | Freq: Every day | RESPIRATORY_TRACT | 5 refills | Status: DC

## 2016-08-20 NOTE — Patient Instructions (Signed)
--  Will start on Anoro, if it costs too much, let us know and we will try something else.

## 2016-08-20 NOTE — Telephone Encounter (Signed)
Left voicemail message to call back for results.  

## 2016-08-20 NOTE — Progress Notes (Signed)
Patient ID: Julie BeetsMary P Salvaggio, female   DOB: Dec 03, 1945, 71 y.o.   MRN: [id number]   Inhaler training provided for patient in clinic today.

## 2016-08-20 NOTE — Progress Notes (Signed)
* [facility]     Assessment and Plan:  The patient is a 71 year old [facility], she has been on oxygen chronically.  COPD/emphysema. --Not currently on any inhaled medications. Will start Anoro, she says that she will consider it if it does not cost her too much.   Chronic hypoxic respiratory failure, oxygen dependent. --Will need to re-qualify for oxygen, will order 6 minute walk test.   Date: 08/20/2016  MRN# [id number] Julie BeetsMary P Keith 27-Jun-1945   Julie LeitzMary P [name] is a 71 y.o. old female seen in follow up for chief complaint of  Chief Complaint  Patient presents with  . Follow-up    VM patient 02 recert Patient wears 2L at rest 3L with exhertion.     HPI:   Patient is a 71 year old female with a history of COPD. She was previous to see by Dr. Meredeth IdeFleming, and by Dr. Mellissa KohutMungal.She needs to requalify for auction, on ambulation in the office today, her oxygen level was above 90%  She ntoes that she leaves the house very seldom, she walks around her house and outside for about 20 minutes on most days. She usually gets winded  With this as well as daily housework. She is on usually puts on oxygen on 2L at rest and 3L with activity. She goes up and down her steps every day, and uses an exercises on an exercise bike.   She is using rescue inhaler about twice per week. She has a nebulizer less than once per week.   Her initial BP was elevated after a walk today, after sitting, her BP was 138/70.   **Personally review of PFT tracings; 08/02/14 FVC is 61%, FEV1 of 51%, with significant reversibility. RV to TLC ratio was elevated 133%. Diffusion capacity is normal at 88% Flow volume loop is consistent with obstruction. -Overall this test consistent with moderate obstructive lung disease with reversibility.  **Personally reviewed, chest x-ray 2 view images from 02/18/16: There is hyperinflation, flattening of the diaphragms consistent with  emphysema.  Medication:   Outpatient Encounter Prescriptions as of 08/20/2016  Medication Sig  . albuterol (PROVENTIL HFA;VENTOLIN HFA) 108 (90 BASE) MCG/ACT inhaler Inhale 2 puffs into the lungs every 6 (six) hours as needed for wheezing or shortness of breath.  . Apremilast 30 MG TABS Take 30 mg by mouth 2 (two) times daily.  Marland Kitchen. aspirin EC 81 MG tablet Take 1 tablet (81 mg total) by mouth daily.  . budesonide (PULMICORT) 0.5 MG/2ML nebulizer solution Take 0.5 mg by nebulization as needed.   . enalapril (VASOTEC) 20 MG tablet Take 1 tablet (20 mg total) by mouth 2 (two) times daily.  Marland Kitchen. glimepiride (AMARYL) 2 MG tablet Take 1 tablet (2 mg total) by mouth daily with breakfast.  . glucose blood (ACCU-CHEK COMPACT PLUS) test strip USE ONE STRIP TO CHECK GLUCOSE ONCE a day if needed  . omeprazole (PRILOSEC) 20 MG capsule Take 1 capsule (20 mg total) by mouth daily.  . pravastatin (PRAVACHOL) 80 MG tablet Take 1 tablet (80 mg total) by mouth daily.   No facility-administered encounter medications on file as of 08/20/2016.      Allergies:  Patient has no known allergies.  Review of Systems: Gen:  Denies  fever, sweats. HEENT: Denies blurred vision. Cvc:  No dizziness, chest pain or heaviness Resp:   Denies cough or sputum porduction. Gi: Denies swallowing difficulty, stomach pain. constipation, bowel incontinence Gu:  Denies bladder incontinence, burning urine Ext:  No Joint pain, stiffness. Skin: No skin rash, easy bruising. Endoc:  No polyuria, polydipsia. Psych: No depression, insomnia. Other:  All other systems were reviewed and found to be negative other than what is mentioned in the HPI.   Physical Examination:   VS: BP (!) 210/60 (BP Location: Left Arm, Patient Position: Sitting, Cuff Size: Normal)   Pulse 60   Resp 16   Ht 5\' 5"  (1.651 m)   Wt 227 lb (103 kg)   SpO2 98% Comment: 3L Pulse  BMI 37.77 kg/m    General Appearance: No distress  Neuro:without focal findings,   speech normal,  HEENT: PERRLA, EOM intact. Pulmonary: normal breath sounds, decreased air entry bilaterally.   CardiovascularNormal S1,S2.  No m/r/g.   Abdomen: Benign, Soft, non-tender. Renal:  No costovertebral tenderness  GU:  Not performed at this time. Endoc: No evident thyromegaly, no signs of acromegaly. Skin:   warm, no rash. Extremities: normal, no cyanosis, clubbing.   LABORATORY PANEL:   CBC No results for input(s): WBC, HGB, HCT, PLT in the last 168 hours. ------------------------------------------------------------------------------------------------------------------  Chemistries  No results for input(s): NA, K, CL, CO2, GLUCOSE, BUN, CREATININE, CALCIUM, MG, AST, ALT, ALKPHOS, BILITOT in the last 168 hours.  Invalid input(s): GFRCGP ------------------------------------------------------------------------------------------------------------------  Cardiac Enzymes No results for input(s): TROPONINI in the last 168 hours. ------------------------------------------------------------  RADIOLOGY:   No results found for this or any previous visit. Results for orders placed during the hospital encounter of 02/18/16  DG Chest 2 View   Narrative CLINICAL DATA:  Cough, shortness of breath, low-grade fever, and chest congestion for the past 3 days. Patient is on home oxygen for COPD-emphysema. Former heavy smoker.  EXAM: CHEST  2 VIEW  COMPARISON:  PA and lateral chest x-ray of July 23, 2014  FINDINGS: The lungs are adequately inflated. There is no focal infiltrate. There is no pleural effusion. The heart and pulmonary vascularity are normal. There is stable biapical pleural thickening. The mediastinum is normal in width. The bony thorax exhibits no acute abnormality.  IMPRESSION: Mild hyperinflation with hemidiaphragm flattening consistent with known emphysema. There is no active cardiopulmonary disease.   Electronically Signed   By: David  [place] M.D.    On: 02/18/2016 14:39    ------------------------------------------------------------------------------------------------------------------  Thank  you for allowing [facility] to assist in the care of your patient. Our recommendations are noted above.  Please contact us if we can be of further service.   Wells Guiles, MD.  [facility] Office Number: [phone]  Santiago Glad, M.D.  Billy Fischer, M.D  08/20/2016

## 2016-08-20 NOTE — Telephone Encounter (Signed)
-----   Message from Almond LintAileen Ingal, MD sent at 08/20/2016  9:43 AM EDT ----- Underlying rhythm was sinus. Short episodes of likely atrial tachycardia. No evidence of A. fib. A lot of PACs. Recommend to start metoprolol XL 25 mg by mouth daily. Follow-up in one month. She also has pending stress test ordered

## 2016-08-20 NOTE — Telephone Encounter (Signed)
Reviewed results and recommendations with patient regarding monitor results. She verbalized understanding and sent prescription to her pharmacy. Let her know that we would like to see her in 1 month and transferred her to get that set up. She was agreeable with plan and has no further questions at this time.

## 2016-08-21 ENCOUNTER — Encounter: Payer: Self-pay | Admitting: Family Medicine

## 2016-08-21 ENCOUNTER — Ambulatory Visit (INDEPENDENT_AMBULATORY_CARE_PROVIDER_SITE_OTHER): Payer: Medicare Other | Admitting: Family Medicine

## 2016-08-21 DIAGNOSIS — I10 Essential (primary) hypertension: Secondary | ICD-10-CM

## 2016-08-21 DIAGNOSIS — J431 Panlobular emphysema: Secondary | ICD-10-CM | POA: Diagnosis not present

## 2016-08-21 DIAGNOSIS — E119 Type 2 diabetes mellitus without complications: Secondary | ICD-10-CM

## 2016-08-21 NOTE — Progress Notes (Signed)
BP (!) 146/74   Pulse 88   Temp 98.7 F (37.1 C) (Oral)   Resp 16   Wt 227 lb (103 kg)   SpO2 (!) 89%   BMI 37.77 kg/m    Subjective:    Patient ID: Julie Keith, female    DOB: 1945/11/20, 71 y.o.   MRN: [id number]  HPI: Julie Keith is a 71 y.o. female  Chief Complaint  Patient presents with  . Follow-up    re certify for oxygen   HPI Patient is here for a visit for her COPD She has her oxygen with her; usually wear 24/7 at 2 liters per minute Underlying disease, she was a smoker; has not smoked since 2006 Smokers in the home growing up, both parents Spouse was a smoker too; he has quit for the time-being She worked in Design[employer], fashion/clothing too Only time she gets bothered is going up steps or working in the house with chores; gets a little winded No chest pain Went to the heart doctor, not in atrial fibrillation Wore a monitor for 3 days and found out heart was beating too fast New medicine beta-blocker; she just picked it up this morning  Review of the chart shows that she saw Dr. Meredeth Ide, then saw Dr. Dema Severin, then saw Dr. Nicholos Johns (all of whom are pulmonologists); Dr. Courtney Paris note June 2017 says she needs to have 6 minute walk test; Dr. Carolyn Stare note from yesterday also says patient needs to requalify and have a 6 minute walk test;   Fro Dr. Carolyn Stare note 08/20/2016: PFT tracings; 08/02/14 FVC is 61%, FEV1 of 51%, with significant reversibility. RV to TLC ratio was elevated 133%. Diffusion capacity is normal at 88% Flow volume loop is consistent with obstruction. -Overall this test consistent with moderate obstructive lung disease with reversibility.  Chest xray report from 02/18/2016: CLINICAL DATA:  Cough, shortness of breath, low-grade fever, and chest congestion for the past 3 days. Patient is on home oxygen for COPD-emphysema. Former heavy smoker.  EXAM: CHEST  2 VIEW  COMPARISON:  PA and lateral chest x-ray of July 23, 2014  FINDINGS: The  lungs are adequately inflated. There is no focal infiltrate. There is no pleural effusion. The heart and pulmonary vascularity are normal. There is stable biapical pleural thickening. The mediastinum is normal in width. The bony thorax exhibits no acute abnormality.  IMPRESSION: Mild hyperinflation with hemidiaphragm flattening consistent with known emphysema. There is no active cardiopulmonary disease.  Electronically Signed   By: David  [place] M.D.   On: 02/18/2016 14:39  Oxygen level did in fact drop to 89% after short ambulation with CMA Kipp Laurence  Her heart doctor wants her to have a stress test and an echocardiogram; she does not want to go through either right now at this time; she'll call us if she changes her mind; we talked about heart attack/heart disease being a concern, and she says she'd rather take her chances and go out her way if it happens; she is afraid something might happen during the test, more so than fear of needing a stent or CABG  Depression screen [facility] 2/9 07/29/2016 04/08/2016 10/10/2015 03/05/2015  Decreased Interest 0 0 0 0  Down, Depressed, Hopeless 0 0 0 0  PHQ - 2 Score 0 0 0 0   Relevant past medical, surgical, family and social history reviewed Past Medical History:  Diagnosis Date  . Allergic rhinitis   . Diabetes (HCC)   . Emphysema lung (HCC)   .  GERD (gastroesophageal reflux disease) 10/10/2015  . Hx of tobacco use, presenting hazards to health 04/08/2016  . Hypercholesteremia   . Hypertension   . Psoriasis 10/10/2015   Past Surgical History:  Procedure Laterality Date  . ABDOMINAL HYSTERECTOMY    . CERVICAL SPINE SURGERY    . CHOLECYSTECTOMY    . MASTECTOMY Bilateral    family history includes COPD in her father; Cancer in her brother; Diabetes in her brother and sister; Leukemia in her mother; Pneumonia in her father. Social History   Social History  . Marital status: Married    Spouse name: N/A  . Number of children: N/A  .  Years of education: N/A   Occupational History  . Not on file.   Social History Main Topics  . Smoking status: Former Smoker    Packs/day: 2.00    Years: 30.00    Types: Cigarettes  . Smokeless tobacco: Never Used     Comment: quit 2006  . Alcohol use No  . Drug use: No  . Sexual activity: Not Currently   Other Topics Concern  . Not on file   Social History Narrative  . No narrative on file   Interim medical history since last visit reviewed. Allergies and medications reviewed  Review of Systems Per HPI unless specifically indicated above     Objective:    BP (!) 146/74   Pulse 88   Temp 98.7 F (37.1 C) (Oral)   Resp 16   Wt 227 lb (103 kg)   SpO2 (!) 89%   BMI 37.77 kg/m   Wt Readings from Last 3 Encounters:  08/21/16 227 lb (103 kg)  08/20/16 227 lb (103 kg)  08/11/16 228 lb 8 oz (103.6 kg)    Physical Exam  Constitutional: She appears well-developed and well-nourished. No distress.  Cardiovascular: Normal rate and regular rhythm.   Pulmonary/Chest: Effort normal. She has decreased breath sounds. She has no wheezes. She has no rhonchi. She has no rales.  Skin: She is not diaphoretic. No pallor.  Psychiatric: Her mood appears not anxious. She does not exhibit a depressed mood.   Diabetic Foot Form - Detailed   Diabetic Foot Exam - detailed Diabetic Foot exam was performed with the following findings:  Yes 08/21/2016  1:49 PM  Visual Foot Exam completed.:  Yes  Are the toenails ingrown?:  No Normal Range of Motion:  Yes Pulse Foot Exam completed.:  Yes  Right Dorsalis Pedis:  Present Left Dorsalis Pedis:  Present  Sensory Foot Exam Completed.:  Yes Swelling:  No Semmes-Weinstein Monofilament Test R Site 1-Great Toe:  Pos L Site 1-Great Toe:  Pos  R Site 4:  Pos L Site 4:  Pos  R Site 5:  Pos L Site 5:  Pos          Assessment & Plan:   Problem List Items Addressed This Visit      Cardiovascular and Mediastinum   Essential hypertension    Work  on weight loss; start new blood pressure medicine today        Respiratory   COPD (chronic obstructive pulmonary disease) (HCC)    Qualify for oxygen; six minute walk test done here in the office day        Endocrine   Type 2 diabetes mellitus without complication (HCC)    Foot exam done today          Follow up plan: No Follow-up on file.  An after-visit summary was printed  and given to the patient at [place].  Please see the patient instructions which may contain other information and recommendations beyond what is mentioned above in the assessment and plan.  No orders of the defined types were placed in this encounter.   No orders of the defined types were placed in this encounter.

## 2016-08-21 NOTE — Patient Instructions (Signed)
We'll see you soon for your next visit

## 2016-08-21 NOTE — Assessment & Plan Note (Addendum)
Qualify for oxygen; six minute walk test done here in the office today; patient actually met the 89% after walking to the scale and to the room before examiner ever saw her; continue 2 liters/minute of supplemental oxygen

## 2016-08-21 NOTE — Assessment & Plan Note (Signed)
Foot exam done today.  

## 2016-08-21 NOTE — Assessment & Plan Note (Signed)
Work on weight loss; start new blood pressure medicine today

## 2016-08-25 DIAGNOSIS — J449 Chronic obstructive pulmonary disease, unspecified: Secondary | ICD-10-CM | POA: Diagnosis not present

## 2016-08-28 ENCOUNTER — Ambulatory Visit (INDEPENDENT_AMBULATORY_CARE_PROVIDER_SITE_OTHER): Payer: Medicare Other | Admitting: Family Medicine

## 2016-08-28 ENCOUNTER — Encounter: Payer: Self-pay | Admitting: Family Medicine

## 2016-08-28 DIAGNOSIS — E782 Mixed hyperlipidemia: Secondary | ICD-10-CM | POA: Diagnosis not present

## 2016-08-28 DIAGNOSIS — E119 Type 2 diabetes mellitus without complications: Secondary | ICD-10-CM

## 2016-08-28 DIAGNOSIS — J431 Panlobular emphysema: Secondary | ICD-10-CM | POA: Diagnosis not present

## 2016-08-28 DIAGNOSIS — I10 Essential (primary) hypertension: Secondary | ICD-10-CM | POA: Diagnosis not present

## 2016-08-28 NOTE — Assessment & Plan Note (Signed)
Next A1c is due on or after July 12th; patient requests to delay that another month to August; foot exam by MD today

## 2016-08-28 NOTE — Assessment & Plan Note (Signed)
BMI >35 plus diabetes; work on weight loss

## 2016-08-28 NOTE — Progress Notes (Signed)
BP 130/70 (BP Location: Left Arm, Patient Position: Sitting, Cuff Size: Normal)   Pulse 68   Temp 98.6 F (37 C) (Oral)   Resp 16   Wt 228 lb 11.2 oz (103.7 kg)   SpO2 96%   BMI 38.06 kg/m    Subjective:    Patient ID: Julie Keith, female    DOB: 06-22-1945, 71 y.o.   MRN: [id number]  HPI: Julie Keith is a 71 y.o. female  Chief Complaint  Patient presents with  . 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[employer], fashion/clothing, grew up around both parents smoking  Type 2 diabetes; feels like it is doing well; some dry mouth, but probably her oxygen; no blurred oxygen; gets up at night 2-3x a night to urinate Lab Results  Component Value Date   HGBA1C 7.4 (H) 07/29/2016   High blood pressure; systolic much better today; down from 146 to 130; she is watching her salt intake and walking more, trying to be more active  No issues with chest pain; she is conscious of skipped beats; she saw the cardiologist, now just on aspirin; was found to not be in a-fib  Depression screen [facility] 2/9 08/28/2016 07/29/2016 04/08/2016 10/10/2015 03/05/2015  Decreased Interest 0 0 0 0 0  Down, Depressed, Hopeless 0 0 0 0 0  PHQ - 2 Score 0 0 0 0 0    Relevant past medical, surgical, family and social history reviewed Past Medical History:  Diagnosis Date  . Allergic rhinitis   . Diabetes (HCC)   . Emphysema lung (HCC)   . GERD (gastroesophageal reflux disease) 10/10/2015  . Hx of tobacco use, presenting hazards to health 04/08/2016  . Hypercholesteremia   . Hypertension   . Psoriasis 10/10/2015   Past Surgical History:  Procedure Laterality Date  . ABDOMINAL HYSTERECTOMY    . CERVICAL SPINE SURGERY    . CHOLECYSTECTOMY    . MASTECTOMY Bilateral    Family History  Problem Relation Age of Onset  .  Leukemia Mother   . Pneumonia Father   . COPD Father   . Diabetes Brother   . Cancer Brother        lung cancer  . Diabetes Sister    Social History   Social History  . Marital status: Married    Spouse name: N/A  . Number of children: N/A  . Years of education: N/A   Occupational History  . Not on file.   Social History Main Topics  . Smoking status: Former Smoker    Packs/day: 2.00    Years: 30.00    Types: Cigarettes  . Smokeless tobacco: Never Used     Comment: quit 2006  . Alcohol use No  . Drug use: No  . Sexual activity: Not Currently   Other Topics Concern  . Not on file   Social History Narrative  . No narrative on file    Interim medical history since last visit reviewed. Allergies and medications reviewed  Review of Systems Per HPI unless specifically indicated above     Objective:    BP 130/70 (BP Location: Left Arm, Patient Position: Sitting, Cuff Size: Normal)   Pulse 68   Temp 98.6 F (37 C) (Oral)   Resp 16  Wt 228 lb 11.2 oz (103.7 kg)   SpO2 96%   BMI 38.06 kg/m   Wt Readings from Last 3 Encounters:  08/28/16 228 lb 11.2 oz (103.7 kg)  08/21/16 227 lb (103 kg)  08/20/16 227 lb (103 kg)    Physical Exam  Constitutional: She appears well-developed and well-nourished.  Obese, no distress; wearing supplemental oxygen via [place]  Cardiovascular: Normal rate.   Extrasystoles are present.  Pulmonary/Chest: Effort normal and breath sounds normal.  Psychiatric: Her mood appears not anxious. She does not exhibit a depressed mood.   Diabetic Foot Form - Detailed   Diabetic Foot Exam - detailed Diabetic Foot exam was performed with the following findings:  Yes 08/28/2016  8:32 AM  Visual Foot Exam completed.:  Yes  Can the patient see the bottom of their feet?:  Yes Are the toenails long?:  No Are the toenails ingrown?:  No Pulse Foot Exam completed.:  Yes  Right Dorsalis Pedis:  Present Left Dorsalis Pedis:  Present  Sensory Foot Exam  Completed.:  Yes Swelling:  No Semmes-Weinstein Monofilament Test R Site 1-Great Toe:  Pos L Site 1-Great Toe:  Pos  R Site 4:  Pos L Site 4:  Pos  R Site 5:  Pos L Site 5:  Pos          Assessment & Plan:   Problem List Items Addressed This Visit      Cardiovascular and Mediastinum   Essential hypertension    Much better controlled; praise given for reducing salt and walking more        Respiratory   COPD (chronic obstructive pulmonary disease) (HCC)    Gold II per Dr. Courtney ParisMungal's note; continue oxygen via [place] at 2 l/m        Endocrine   Type 2 diabetes mellitus without complication (HCC)    Next A1c is due on or after July 12th; patient requests to delay that another month to August; foot exam by MD today        Other   Morbid obesity (HCC)    BMI >35 plus diabetes; work on weight loss      Hyperlipidemia    Limit saturated fats         Follow up plan: Return in about 3 months (around 11/30/2016) for twenty minute follow-up with fasting labs.  An after-visit summary was printed and given to the patient at check-out.  Please see the patient instructions which may contain other information and recommendations beyond what is mentioned above in the assessment and plan.

## 2016-08-28 NOTE — Assessment & Plan Note (Signed)
Much better controlled; praise given for reducing salt and walking more

## 2016-08-28 NOTE — Assessment & Plan Note (Signed)
Gold II per Dr. Courtney ParisMungal's note; continue oxygen via [place] at 2 l/m

## 2016-08-28 NOTE — Assessment & Plan Note (Signed)
Limit saturated fats 

## 2016-08-28 NOTE — Patient Instructions (Addendum)
Diabetes Mellitus and Food It is important for you to manage your blood sugar (glucose) level. Your blood glucose level can be greatly affected by what you eat. Eating healthier foods in the appropriate amounts throughout the day at about the same time each day will help you control your blood glucose level. It can also help slow or prevent worsening of your diabetes mellitus. Healthy eating may even help you improve the level of your blood pressure and reach or maintain a healthy weight. General recommendations for healthful eating and cooking habits include:  Eating meals and snacks regularly. Avoid going long periods of time without eating to lose weight.  Eating a diet that consists mainly of plant-based foods, such as fruits, vegetables, nuts, legumes, and whole grains.  Using low-heat cooking methods, such as baking, instead of high-heat cooking methods, such as deep frying. Work with your dietitian to make sure you understand how to use the Nutrition Facts information on food labels. How can food affect me? Carbohydrates  Carbohydrates affect your blood glucose level more than any other type of food. Your dietitian will help you determine how many carbohydrates to eat at each meal and teach you how to count carbohydrates. Counting carbohydrates is important to keep your blood glucose at a healthy level, especially if you are using insulin or taking certain medicines for diabetes mellitus. Alcohol  Alcohol can cause sudden decreases in blood glucose (hypoglycemia), especially if you use insulin or take certain medicines for diabetes mellitus. Hypoglycemia can be a life-threatening condition. Symptoms of hypoglycemia (sleepiness, dizziness, and disorientation) are similar to symptoms of having too much alcohol. If your health care provider has given you approval to drink alcohol, do so in moderation and use the following guidelines:  Women should not have more than one drink per day, and men  should not have more than two drinks per day. One drink is equal to:  12 oz of beer.  5 oz of wine.  1 oz of hard liquor.  Do not drink on an empty stomach.  Keep yourself hydrated. Have water, diet soda, or unsweetened iced tea.  Regular soda, juice, and other mixers might contain a lot of carbohydrates and should be counted. What foods are not recommended? As you make food choices, it is important to remember that all foods are not the same. Some foods have fewer nutrients per serving than other foods, even though they might have the same number of calories or carbohydrates. It is difficult to get your body what it needs when you eat foods with fewer nutrients. Examples of foods that you should avoid that are high in calories and carbohydrates but low in nutrients include:  Trans fats (most processed foods list trans fats on the Nutrition Facts label).  Regular soda.  Juice.  Candy.  Sweets, such as cake, pie, doughnuts, and cookies.  Fried foods. What foods can I eat? Eat nutrient-rich foods, which will nourish your body and keep you healthy. The food you should eat also will depend on several factors, including:  The calories you need.  The medicines you take.  Your weight.  Your blood glucose level.  Your blood pressure level.  Your cholesterol level. You should eat a variety of foods, including:  Protein.  Lean cuts of meat.  Proteins low in saturated fats, such as fish, egg whites, and beans. Avoid processed meats.  Fruits and vegetables.  Fruits and vegetables that may help control blood glucose levels, such as apples, mangoes, and   yams.  Dairy products.  Choose fat-free or low-fat dairy products, such as milk, yogurt, and cheese.  Grains, bread, pasta, and rice.  Choose whole grain products, such as multigrain bread, whole oats, and brown rice. These foods may help control blood pressure.  Fats.  Foods containing healthful fats, such as nuts,  avocado, olive oil, canola oil, and fish. Does everyone with diabetes mellitus have the same meal plan? Because every person with diabetes mellitus is different, there is not one meal plan that works for everyone. It is very important that you meet with a dietitian who will help you create a meal plan that is just right for you. This information is not intended to replace advice given to you by your health care provider. Make sure you discuss any questions you have with your health care provider. Document Released: 01/01/2005 Document Revised: 09/12/2015 Document Reviewed: 03/03/2013 Elsevier Interactive Patient Education  2017 [employer]  Calorie Counting for [employer] Calories are units of energy. Your body needs a certain amount of calories from food to keep you going throughout the day. When you eat more calories than your body needs, your body stores the extra calories as fat. When you eat fewer calories than your body needs, your body burns fat to get the energy it needs. Calorie counting means keeping track of how many calories you eat and drink each day. Calorie counting can be helpful if you need to lose weight. If you make sure to eat fewer calories than your body needs, you should lose weight. Ask your health care provider what a healthy weight is for you. For calorie counting to work, you will need to eat the right number of calories in a day in order to lose a healthy amount of weight per week. A dietitian can help you determine how many calories you need in a day and will give you suggestions on how to reach your calorie goal.  A healthy amount of weight to lose per week is usually 1-2 lb (0.5-0.9 kg). This usually means that your daily calorie intake should be reduced by 500-750 calories.  Eating 1,200 - 1,500 calories per day can help most women lose weight.  Eating 1,500 - 1,800 calories per day can help most men lose weight. What is my plan? My goal is to have __________  calories per day. If I have this many calories per day, I should lose around __________ pounds per week. What do I need to know about calorie counting? In order to meet your daily calorie goal, you will need to:  Find out how many calories are in each food you would like to eat. Try to do this before you eat.  Decide how much of the food you plan to eat.  Write down what you ate and how many calories it had. Doing this is called keeping a food log. To successfully lose weight, it is important to balance calorie counting with a healthy lifestyle that includes regular activity. Aim for 150 minutes of moderate exercise (such as walking) or 75 minutes of vigorous exercise (such as running) each week. Where do I find calorie information?   The number of calories in a food can be found on a Nutrition Facts label. If a food does not have a Nutrition Facts label, try to look up the calories online or ask your dietitian for help. Remember that calories are listed per serving. If you choose to have more than one serving of a food,  you will have to multiply the calories per serving by the amount of servings you plan to eat. For example, the label on a package of bread might say that a serving size is 1 slice and that there are 90 calories in a serving. If you eat 1 slice, you will have eaten 90 calories. If you eat 2 slices, you will have eaten 180 calories. How do I keep a food log? Immediately after each meal, record the following information in your food log:  What you ate. Don't forget to include toppings, sauces, and other extras on the food.  How much you ate. This can be measured in cups, ounces, or number of items.  How many calories each food and drink had.  The total number of calories in the meal. Keep your food log near you, such as in a small notebook in your pocket, or use a mobile app or website. Some programs will calculate calories for you and show you how many calories you have left  for the day to meet your goal. What are some calorie counting tips?  Use your calories on foods and drinks that will fill you up and not leave you hungry:  Some examples of foods that fill you up are nuts and nut butters, vegetables, lean proteins, and high-fiber foods like whole grains. High-fiber foods are foods with more than 5 g fiber per serving.  Drinks such as sodas, specialty coffee drinks, alcohol, and juices have a lot of calories, yet do not fill you up.  Eat nutritious foods and avoid empty calories. Empty calories are calories you get from foods or beverages that do not have many vitamins or protein, such as candy, sweets, and soda. It is better to have a nutritious high-calorie food (such as an avocado) than a food with few nutrients (such as a bag of chips).  Know how many calories are in the foods you eat most often. This will help you calculate calorie counts faster.  Pay attention to calories in drinks. Low-calorie drinks include water and unsweetened drinks.  Pay attention to nutrition labels for "low fat" or "fat free" foods. These foods sometimes have the same amount of calories or more calories than the full fat versions. They also often have added sugar, starch, or salt, to make up for flavor that was removed with the fat.  Find a way of tracking calories that works for you. Get creative. Try different apps or programs if writing down calories does not work for you. What are some portion control tips?  Know how many calories are in a serving. This will help you know how many servings of a certain food you can have.  Use a measuring cup to measure serving sizes. You could also try weighing out portions on a kitchen scale. With time, you will be able to estimate serving sizes for some foods.  Take some time to put servings of different foods on your favorite plates, bowls, and cups so you know what a serving looks like.  Try not to eat straight from a bag or box. Doing  this can lead to overeating. Put the amount you would like to eat in a cup or on a plate to make sure you are eating the right portion.  Use smaller plates, glasses, and bowls to prevent overeating.  Try not to multitask (for example, watch TV or use your computer) while eating. If it is time to eat, sit down at a table and enjoy  your food. This will help you to know when you are full. It will also help you to be aware of what you are eating and how much you are eating. What are tips for following this plan? Reading food labels   Check the calorie count compared to the serving size. The serving size may be smaller than what you are used to eating.  Check the source of the calories. Make sure the food you are eating is high in vitamins and protein and low in saturated and trans fats. Shopping   Read nutrition labels while you shop. This will help you make healthy decisions before you decide to purchase your food.  Make a grocery list and stick to it. Cooking   Try to cook your favorite foods in a healthier way. For example, try baking instead of frying.  Use low-fat dairy products. Meal planning   Use more fruits and vegetables. Half of your plate should be fruits and vegetables.  Include lean proteins like poultry and fish. How do I count calories when eating out?  Ask for smaller portion sizes.  Consider sharing an entree and sides instead of getting your own entree.  If you get your own entree, eat only half. Ask for a box at the beginning of your meal and put the rest of your entree in it so you are not tempted to eat it.  If calories are listed on the menu, choose the lower calorie options.  Choose dishes that include vegetables, fruits, whole grains, low-fat dairy products, and lean protein.  Choose items that are boiled, broiled, grilled, or steamed. Stay away from items that are buttered, battered, fried, or served with cream sauce. Items labeled "crispy" are usually  fried, unless stated otherwise.  Choose water, low-fat milk, unsweetened iced tea, or other drinks without added sugar. If you want an alcoholic beverage, choose a lower calorie option such as a glass of wine or light beer.  Ask for dressings, sauces, and syrups on the side. These are usually high in calories, so you should limit the amount you eat.  If you want a salad, choose a garden salad and ask for grilled meats. Avoid extra toppings like bacon, cheese, or fried items. Ask for the dressing on the side, or ask for olive oil and vinegar or lemon to use as dressing.  Estimate how many servings of a food you are given. For example, a serving of cooked rice is  cup or about the size of half a baseball. Knowing serving sizes will help you be aware of how much food you are eating at restaurants. The list below tells you how big or small some common portion sizes are based on everyday objects:  1 oz-4 stacked dice.  3 oz-1 deck of cards.  1 tsp-1 die.  1 Tbsp- a ping-pong ball.  2 Tbsp-1 ping-pong ball.   cup- baseball.  1 cup-1 baseball. Summary  Calorie counting means keeping track of how many calories you eat and drink each day. If you eat fewer calories than your body needs, you should lose weight.  A healthy amount of weight to lose per week is usually 1-2 lb (0.5-0.9 kg). This usually means reducing your daily calorie intake by 500-750 calories.  The number of calories in a food can be found on a Nutrition Facts label. If a food does not have a Nutrition Facts label, try to look up the calories online or ask your dietitian for help.  Use your  calories on foods and drinks that will fill you up, and not on foods and drinks that will leave you hungry.  Use smaller plates, glasses, and bowls to prevent overeating. This information is not intended to replace advice given to you by your health care provider. Make sure you discuss any questions you have with your health care  provider. Document Released: 04/06/2005 Document Revised: 03/06/2016 Document Reviewed: 03/06/2016 Elsevier Interactive Patient Education  2017 [employer].

## 2016-09-16 DIAGNOSIS — L400 Psoriasis vulgaris: Secondary | ICD-10-CM | POA: Diagnosis not present

## 2016-09-16 DIAGNOSIS — Z79899 Other long term (current) drug therapy: Secondary | ICD-10-CM | POA: Diagnosis not present

## 2016-09-25 DIAGNOSIS — J449 Chronic obstructive pulmonary disease, unspecified: Secondary | ICD-10-CM | POA: Diagnosis not present

## 2016-10-01 ENCOUNTER — Ambulatory Visit: Payer: Medicare Other | Admitting: Nurse Practitioner

## 2016-10-07 ENCOUNTER — Ambulatory Visit: Payer: Medicare Other | Admitting: Internal Medicine

## 2016-10-22 ENCOUNTER — Encounter: Payer: Self-pay | Admitting: Cardiovascular Disease

## 2016-10-22 ENCOUNTER — Ambulatory Visit (INDEPENDENT_AMBULATORY_CARE_PROVIDER_SITE_OTHER): Payer: Medicare Other | Admitting: Cardiovascular Disease

## 2016-10-22 VITALS — BP 152/76 | HR 79 | Ht 65.0 in | Wt 226.5 lb

## 2016-10-22 DIAGNOSIS — I10 Essential (primary) hypertension: Secondary | ICD-10-CM | POA: Diagnosis not present

## 2016-10-22 DIAGNOSIS — I491 Atrial premature depolarization: Secondary | ICD-10-CM | POA: Diagnosis not present

## 2016-10-22 MED ORDER — METOPROLOL SUCCINATE ER 25 MG PO TB24: 25.0000 mg | ORAL_TABLET | Freq: Every day | ORAL | 3 refills | Status: DC

## 2016-10-22 NOTE — Progress Notes (Signed)
Cardiology Office Note   Date:  10/22/2016   ID:  Julie Keith, DOB [birth year], MRN [id number]  PCP:  Kerman PasseyLada, Melinda P, MD  Cardiologist:   Lorine BearsMuhammad Arida, MD   Chief Complaint  Patient presents with  . other    1/mo f/u. Pt states she is doing well. Reviewed meds with pt verbally.      History of Present Illness: Julie Keith is a 71 y.o. female who presents for A follow-up visit. She was seen by Dr. Alvino ChapelIngal in April for evaluation of possible atrial fibrillation. She also had chest pain. Her EKG was read by the machine as atrial fibrillation but that was sinus rhythm with PACs. She has chronic medical conditions that include diabetes mellitus, hypertension, previous tobacco use and COPD.  She underwent a Holter monitor which showed sinus rhythm with 2 episodes of supraventricular tachycardia likely atrial tachycardia. She was started on metoprolol 25 mg once daily.A stress test and echocardiogram were suggested but were declined by the patient for financial reasons.She has been doing well overall with no episodes of chest pain. She reports chronic exertional dyspnea due to COPD. She is on home oxygen. No further palpitations. She reports being under stress due to illness of her husband.   Past Medical History:  Diagnosis Date  . Allergic rhinitis   . Diabetes (HCC)   . Emphysema lung (HCC)   . GERD (gastroesophageal reflux disease) 10/10/2015  . Hx of tobacco use, presenting hazards to health 04/08/2016  . Hypercholesteremia   . Hypertension   . Psoriasis 10/10/2015    Past Surgical History:  Procedure Laterality Date  . ABDOMINAL HYSTERECTOMY    . CERVICAL SPINE SURGERY    . CHOLECYSTECTOMY    . MASTECTOMY Bilateral      Current Outpatient Prescriptions  Medication Sig Dispense Refill  . albuterol (PROVENTIL HFA;VENTOLIN HFA) 108 (90 BASE) MCG/ACT inhaler Inhale 2 puffs into the lungs every 6 (six) hours as needed for wheezing or shortness of breath. 1 Inhaler 2  .  Apremilast 30 MG TABS Take 30 mg by mouth 2 (two) times daily.    Marland Kitchen. aspirin EC 81 MG tablet Take 1 tablet (81 mg total) by mouth daily. 90 tablet 3  . budesonide (PULMICORT) 0.5 MG/2ML nebulizer solution Take 0.5 mg by nebulization as needed.     . enalapril (VASOTEC) 20 MG tablet Take 1 tablet (20 mg total) by mouth 2 (two) times daily. 180 tablet 1  . glimepiride (AMARYL) 2 MG tablet Take 1 tablet (2 mg total) by mouth daily with breakfast. 90 tablet 1  . glucose blood (ACCU-CHEK COMPACT PLUS) test strip USE ONE STRIP TO CHECK GLUCOSE ONCE a day if needed 100 each 3  . metoprolol succinate (TOPROL XL) 25 MG 24 hr tablet Take 1 tablet (25 mg total) by mouth daily. 30 tablet 3  . omeprazole (PRILOSEC) 20 MG capsule Take 1 capsule (20 mg total) by mouth daily. 90 capsule 1  . pravastatin (PRAVACHOL) 80 MG tablet Take 1 tablet (80 mg total) by mouth daily. 90 tablet 1  . triamcinolone ointment (KENALOG) 0.1 % Apply topically as needed.  1   No current facility-administered medications for this visit.     Allergies:   Patient has no known allergies.    Social History:  The patient  reports that she has quit smoking. Her smoking use included Cigarettes. She has a 60.00 pack-year smoking history. She has never used smokeless tobacco. She reports that  she does not drink alcohol or use drugs.   Family History:  The patient's family history includes COPD in her father; Cancer in her brother; Diabetes in her brother and sister; Leukemia in her mother; Pneumonia in her father.    ROS:  Please see the history of present illness.   Otherwise, review of systems are positive for none.   All other systems are reviewed and negative.    PHYSICAL EXAM: VS:  Ht 5\' 5"  (1.651 m)   Wt 226 lb 8 oz (102.7 kg)   BMI 37.69 kg/m  , BMI Body mass index is 37.69 kg/m. GEN: Well nourished, well developed, in no acute distress  HEENT: normal  Neck: no JVD, carotid bruits, or masses Cardiac: RRR; no  rubs, or  gallops,no edema . One out of 6 systolic ejection murmur at the aortic area  Respiratory:  clear to auscultation bilaterally, normal work of breathing GI: soft, nontender, nondistended, + BS MS: no deformity or atrophy  Skin: warm and dry, no rash Neuro:  Strength and sensation are intact Psych: euthymic mood, full affect   EKG:  EKG is not ordered today.    Recent Labs: 07/29/2016: ALT 9; BUN 13; Creat 1.22; Hemoglobin 13.1; Magnesium 1.9; Platelets 418; Potassium 5.0; Sodium 139; TSH 1.45    Lipid Panel    Component Value Date/Time   CHOL 178 07/29/2016 0842   CHOL 205 (H) 03/13/2015 0932   TRIG 102 07/29/2016 0842   HDL 62 07/29/2016 0842   HDL 52 03/13/2015 0932   CHOLHDL 2.9 07/29/2016 0842   VLDL 20 07/29/2016 0842   LDLCALC 96 07/29/2016 0842   LDLCALC 129 (H) 03/13/2015 0932      Wt Readings from Last 3 Encounters:  10/22/16 226 lb 8 oz (102.7 kg)  08/28/16 228 lb 11.2 oz (103.7 kg)  08/21/16 227 lb (103 kg)       PAD Screen 08/11/2016  Previous PAD dx? No  Previous surgical procedure? No  Pain with walking? No  Feet/toe relief with dangling? Yes  Painful, non-healing ulcers? No  Extremities discolored? No      ASSESSMENT AND PLAN:  1.  Palpitations: PACs and short runs of SVT were noted. She has been treated with small dose Toprol. No evidence of atrial fibrillation. Continue small dose Toprol.  2. Atypical chest pain: She reports no episodes of chest pain at the present time. Continue medical therapy. I suggested that she at least considered an echocardiogram given dyspnea. She wants to hold off.  3. Essential hypertension:Blood pressure is mildly elevated. Continue to monitor and consider increasing metoprolol.   Disposition:   FU with me in 1 year  Signed,  Lorine Bears, MD  10/22/2016 8:09 AM    [facility]

## 2016-10-22 NOTE — Patient Instructions (Signed)
Medication Instructions: Continue same medications.   Labwork: None.   Procedures/Testing: None.   Follow-Up: 1 year with Dr. Shawanda Sievert.   Any Additional Special Instructions Will Be Listed Below (If Applicable).     If you need a refill on your cardiac medications before your next appointment, please call your pharmacy.   

## 2016-10-23 ENCOUNTER — Ambulatory Visit: Payer: Medicare Other | Admitting: Cardiovascular Disease

## 2016-10-25 DIAGNOSIS — J449 Chronic obstructive pulmonary disease, unspecified: Secondary | ICD-10-CM | POA: Diagnosis not present

## 2016-11-03 ENCOUNTER — Ambulatory Visit: Payer: Medicare Other | Admitting: Cardiovascular Disease

## 2016-11-06 ENCOUNTER — Ambulatory Visit: Payer: Medicare Other | Admitting: Cardiovascular Disease

## 2016-11-25 DIAGNOSIS — J449 Chronic obstructive pulmonary disease, unspecified: Secondary | ICD-10-CM | POA: Diagnosis not present

## 2016-11-30 ENCOUNTER — Encounter: Payer: Self-pay | Admitting: Family Medicine

## 2016-12-02 ENCOUNTER — Encounter: Payer: Self-pay | Admitting: Family Medicine

## 2016-12-02 ENCOUNTER — Ambulatory Visit (INDEPENDENT_AMBULATORY_CARE_PROVIDER_SITE_OTHER): Payer: Medicare Other | Admitting: Family Medicine

## 2016-12-02 DIAGNOSIS — I10 Essential (primary) hypertension: Secondary | ICD-10-CM | POA: Diagnosis not present

## 2016-12-02 DIAGNOSIS — Z5181 Encounter for therapeutic drug level monitoring: Secondary | ICD-10-CM

## 2016-12-02 DIAGNOSIS — E782 Mixed hyperlipidemia: Secondary | ICD-10-CM

## 2016-12-02 DIAGNOSIS — J431 Panlobular emphysema: Secondary | ICD-10-CM

## 2016-12-02 DIAGNOSIS — J9611 Chronic respiratory failure with hypoxia: Secondary | ICD-10-CM

## 2016-12-02 DIAGNOSIS — E119 Type 2 diabetes mellitus without complications: Secondary | ICD-10-CM | POA: Diagnosis not present

## 2016-12-02 NOTE — Patient Instructions (Signed)
Please have labs done in the next few days at [facility] Check out the information at [email or web address] entitled "Nutrition for Weight Loss: What You Need to Know about Fad Diets" Try to lose between 1-2 pounds per week by taking in fewer calories and burning off more calories You can succeed by limiting portions, limiting foods dense in calories and fat, becoming more active, and drinking 8 glasses of water a day (64 ounces) Don't skip meals, especially breakfast, as skipping meals may alter your metabolism Do not use over-the-counter weight loss pills or gimmicks that claim rapid weight loss A healthy BMI (or body mass index) is between 18.5 and 24.9 You can calculate your ideal BMI at the NIH website [employer]huhttp://www.nhlbi.nih.gov/health/educational/lose_wt/BMI/bmicalc.htm

## 2016-12-02 NOTE — Assessment & Plan Note (Signed)
Good control; avoid excessive salt; try to follow the DASH guidelines

## 2016-12-02 NOTE — Assessment & Plan Note (Signed)
Continue nebulized steroid and PRN SABA

## 2016-12-02 NOTE — Assessment & Plan Note (Signed)
Continue oxygen around the clock

## 2016-12-02 NOTE — Assessment & Plan Note (Signed)
Avoid sweets, limit white bread; foot exam by MD today; check A1c

## 2016-12-02 NOTE — Assessment & Plan Note (Signed)
BMI >35 and diabetes = morbid obesity; praise given for weight loss

## 2016-12-02 NOTE — Progress Notes (Signed)
BP 138/74   Pulse 87   Temp 98.6 F (37 C) (Oral)   Resp 18   Wt 227 lb 3.2 oz (103.1 kg)   SpO2 94%   BMI 37.81 kg/m    Subjective:    Patient ID: Julie Keith, female    DOB: 1946-04-19, 71 y.o.   MRN: [id number]  HPI: Julie Keith is a 71 y.o. female  Chief Complaint  Patient presents with  . Follow-up    HPI  COPD; breathing has been good; not seeing another for lungs right now; on oxygen around the clock, at 2 l/m; she feels some benefit from that; using SABA rarely  Type 2 diabetes; been doing good; avoiding sugary drinks; white bread, not crazy about bread anyway Not spilling protein in April Lab Results  Component Value Date   HGBA1C 7.4 (H) 07/29/2016   GERD is "doing good as long as I'm taking that medicine"; avoids triggers; can eat pizza  High cholesterol; trying to avoid fatty meats; maybe one egg a week;  Last LDL was 96 in April, last HDL 62 Depression screen [facility] 2/9 12/02/2016 08/28/2016 07/29/2016 04/08/2016 10/10/2015  Decreased Interest 0 0 0 0 0  Down, Depressed, Hopeless 0 0 0 0 0  PHQ - 2 Score 0 0 0 0 0   Obesity; she is trying to lose weight, trying to eat better; has lost two pounds she says  Relevant past medical, surgical, family and social history reviewed Past Medical History:  Diagnosis Date  . Allergic rhinitis   . Diabetes ([place])   . Emphysema lung ([place])   . GERD (gastroesophageal reflux disease) 10/10/2015  . Hx of tobacco use, presenting hazards to health 04/08/2016  . Hypercholesteremia   . Hypertension   . Psoriasis 10/10/2015   Past Surgical History:  Procedure Laterality Date  . ABDOMINAL HYSTERECTOMY    . CERVICAL SPINE SURGERY    . CHOLECYSTECTOMY    . MASTECTOMY Bilateral    Family History  Problem Relation Age of Onset  . Leukemia Mother   . Pneumonia Father   . COPD Father   . Diabetes Brother   . Cancer Brother        lung cancer  . Diabetes Sister    Social History   Social History  . Marital  status: Married    Spouse name: N/A  . Number of children: N/A  . Years of education: N/A   Occupational History  . Not on file.   Social History Main Topics  . Smoking status: Former Smoker    Packs/day: 2.00    Years: 30.00    Types: Cigarettes  . Smokeless tobacco: Never Used     Comment: quit 2006  . Alcohol use No  . Drug use: No  . Sexual activity: Not Currently   Other Topics Concern  . Not on file   Social History Narrative  . No narrative on file    Interim medical history since last visit reviewed. Allergies and medications reviewed  Review of Systems Per HPI unless specifically indicated above     Objective:    BP 138/74   Pulse 87   Temp 98.6 F (37 C) (Oral)   Resp 18   Wt 227 lb 3.2 oz (103.1 kg)   SpO2 94%   BMI 37.81 kg/m   Wt Readings from Last 3 Encounters:  12/02/16 227 lb 3.2 oz (103.1 kg)  10/22/16 226 lb 8 oz (102.7 kg)  08/28/16 228  lb 11.2 oz (103.7 kg)    Physical Exam  Constitutional: She appears well-developed and well-nourished.  Obese, no distress; wearing supplemental oxygen via [place]  Eyes: No scleral icterus.  Neck: No JVD present.  Cardiovascular: Normal rate and regular rhythm.   No extrasystoles are present.  Pulmonary/Chest: Effort normal and breath sounds normal.  Abdominal: Normal appearance and bowel sounds are normal. There is no tenderness.  Musculoskeletal: She exhibits no edema.  Neurological: She is alert.  Skin: No pallor.  Dry skin on the feet  Psychiatric: Her mood appears not anxious. She does not exhibit a depressed mood.    Diabetic Foot Form - Detailed   Diabetic Foot Exam - detailed Diabetic Foot exam was performed with the following findings:  Yes 12/02/2016  8:41 AM  Visual Foot Exam completed.:  Yes  Is there swelling or and abnormal foot shape?:  No Pulse Foot Exam completed.:  Yes  Right Dorsalis Pedis:  Present Left Dorsalis Pedis:  Present  Sensory Foot Exam Completed.:  Yes Semmes-Weinstein  Monofilament Test R Site 1-Great Toe:  Pos L Site 1-Great Toe:  Pos        Results for orders placed or performed in visit on 07/29/16  COMPLETE METABOLIC PANEL WITH GFR  Result Value Ref Range   Sodium 139 135 - 146 mmol/L   Potassium 5.0 3.5 - 5.3 mmol/L   Chloride 98 98 - 110 mmol/L   CO2 30 20 - 31 mmol/L   Glucose, Bld 160 (H) 65 - 99 mg/dL   BUN 13 7 - 25 mg/dL   Creat 1.22 (H) 0.60 - 0.93 mg/dL   Total Bilirubin 0.5 0.2 - 1.2 mg/dL   Alkaline Phosphatase 87 33 - 130 U/L   AST 14 10 - 35 U/L   ALT 9 6 - 29 U/L   Total Protein 7.5 6.1 - 8.1 g/dL   Albumin 4.1 3.6 - 5.1 g/dL   Calcium 9.7 8.6 - 10.4 mg/dL   GFR, Est African American 51 (L) >=60 mL/min   GFR, Est Non African American 44 (L) >=60 mL/min  Hemoglobin A1c  Result Value Ref Range   Hgb A1c MFr Bld 7.4 (H) <5.7 %   Mean Plasma Glucose 166 mg/dL  Lipid panel  Result Value Ref Range   Cholesterol 178 <200 mg/dL   Triglycerides 102 <150 mg/dL   HDL 62 >50 mg/dL   Total CHOL/HDL Ratio 2.9 <5.0 Ratio   VLDL 20 <30 mg/dL   LDL Cholesterol 96 <100 mg/dL  Microalbumin / creatinine urine ratio  Result Value Ref Range   Creatinine, Urine 278 20 - 320 mg/dL   Microalb, Ur 4.6 Not estab mg/dL   Microalb Creat Ratio 17 <30 mcg/mg creat  TSH  Result Value Ref Range   TSH 1.45 mIU/L  Magnesium  Result Value Ref Range   Magnesium 1.9 1.5 - 2.5 mg/dL  CBC with Differential/Platelet  Result Value Ref Range   WBC 10.9 (H) 3.8 - 10.8 K/uL   RBC 4.87 3.80 - 5.10 MIL/uL   Hemoglobin 13.1 11.7 - 15.5 g/dL   HCT 42.4 35.0 - 45.0 %   MCV 87.1 80.0 - 100.0 fL   MCH 26.9 (L) 27.0 - 33.0 pg   MCHC 30.9 (L) 32.0 - 36.0 g/dL   RDW 14.7 11.0 - 15.0 %   Platelets 418 (H) 140 - 400 K/uL   MPV 9.6 7.5 - 12.5 fL   Neutro Abs 7,303 1,500 - 7,800 cells/uL  Lymphs Abs 2,834 850 - 3,900 cells/uL   Monocytes Absolute 654 200 - 950 cells/uL   Eosinophils Absolute 109 15 - 500 cells/uL   Basophils Absolute 0 0 - 200 cells/uL    Neutrophils Relative % 67 %   Lymphocytes Relative 26 %   Monocytes Relative 6 %   Eosinophils Relative 1 %   Basophils Relative 0 %   Smear Review Criteria for review not met   Troponin I  Result Value Ref Range   Troponin I 0.01 <=0.05 ng/mL      Assessment & Plan:   Problem List Items Addressed This Visit      Cardiovascular and Mediastinum   Essential hypertension    Good control; avoid excessive salt; try to follow the DASH guidelines        Respiratory   COPD (chronic obstructive pulmonary disease) (HCC)    Continue nebulized steroid and PRN SABA      Chronic respiratory failure (HCC)    Continue oxygen around the clock        Endocrine   Type 2 diabetes mellitus without complication (HCC)    Avoid sweets, limit white bread; foot exam by MD today; check A1c      Relevant Orders   Basic Metabolic Panel (BMET)   Hemoglobin A1c     Other   Morbid obesity (HCC)    BMI >35 and diabetes = morbid obesity; praise given for weight loss      Medication monitoring encounter    Monitor kidney function      Relevant Orders   Basic Metabolic Panel (BMET)   Hyperlipidemia    Continue the statin; monitor lipids periodically; avoid saturated fats; praise given for weight loss          Follow up plan: Return in about 3 months (around 03/04/2017) for twenty minute follow-up with fasting labs.  An after-visit summary was printed and given to the patient at [place].  Please see the patient instructions which may contain other information and recommendations beyond what is mentioned above in the assessment and plan.  No orders of the defined types were placed in this encounter.   Orders Placed This Encounter  Procedures  . Basic Metabolic Panel (BMET)  . Hemoglobin A1c

## 2016-12-02 NOTE — Assessment & Plan Note (Signed)
Continue the statin; monitor lipids periodically; avoid saturated fats; praise given for weight loss

## 2016-12-02 NOTE — Assessment & Plan Note (Signed)
Monitor kidney function.

## 2016-12-03 LAB — BASIC METABOLIC PANEL WITH GFR
BUN/Creatinine Ratio: 13 (ref 12–28)
BUN: 14 mg/dL (ref 8–27)
CO2: 27 mmol/L (ref 20–29)
Calcium: 9.7 mg/dL (ref 8.7–10.3)
Chloride: 96 mmol/L (ref 96–106)
Creatinine, Ser: 1.07 mg/dL — ABNORMAL HIGH (ref 0.57–1.00)
GFR calc Af Amer: 60 mL/min/1.73
GFR calc non Af Amer: 52 mL/min/1.73 — ABNORMAL LOW
Glucose: 155 mg/dL — ABNORMAL HIGH (ref 65–99)
Potassium: 5.1 mmol/L (ref 3.5–5.2)
Sodium: 138 mmol/L (ref 134–144)

## 2016-12-03 LAB — HEMOGLOBIN A1C
ESTIMATED AVERAGE GLUCOSE: 174 mg/dL
HEMOGLOBIN A1C: 7.7 % — AB (ref 4.8–5.6)

## 2016-12-04 ENCOUNTER — Other Ambulatory Visit: Payer: Self-pay | Admitting: Family Medicine

## 2016-12-04 ENCOUNTER — Encounter: Payer: Self-pay | Admitting: Family Medicine

## 2016-12-04 MED ORDER — LINAGLIPTIN 5 MG PO TABS: 5.0000 mg | ORAL_TABLET | Freq: Every day | ORAL | 6 refills | Status: DC

## 2016-12-07 MED ORDER — GLIMEPIRIDE 4 MG PO TABS: 4.0000 mg | ORAL_TABLET | Freq: Every day | ORAL | 3 refills | Status: DC

## 2016-12-07 NOTE — Telephone Encounter (Signed)
I returned her call The medicine is terribly expensive Take 2 of the 2 mg amaryl If any low sugars, go back to 2 mg and call

## 2016-12-18 ENCOUNTER — Telehealth: Payer: Self-pay | Admitting: Family Medicine

## 2016-12-26 DIAGNOSIS — J449 Chronic obstructive pulmonary disease, unspecified: Secondary | ICD-10-CM | POA: Diagnosis not present

## 2017-01-07 NOTE — Telephone Encounter (Signed)
Note is blank; closing 

## 2017-01-18 ENCOUNTER — Telehealth: Payer: Self-pay

## 2017-01-18 ENCOUNTER — Ambulatory Visit (INDEPENDENT_AMBULATORY_CARE_PROVIDER_SITE_OTHER): Payer: Medicare Other

## 2017-01-18 ENCOUNTER — Other Ambulatory Visit: Payer: Self-pay

## 2017-01-18 ENCOUNTER — Other Ambulatory Visit: Payer: Self-pay | Admitting: Family Medicine

## 2017-01-18 DIAGNOSIS — Z23 Encounter for immunization: Secondary | ICD-10-CM | POA: Diagnosis not present

## 2017-01-18 DIAGNOSIS — Z20818 Contact with and (suspected) exposure to other bacterial communicable diseases: Secondary | ICD-10-CM

## 2017-01-18 DIAGNOSIS — Z22322 Carrier or suspected carrier of Methicillin resistant Staphylococcus aureus: Secondary | ICD-10-CM

## 2017-01-18 NOTE — Progress Notes (Signed)
Husband tested positive for MRSA; will screen patient

## 2017-01-18 NOTE — Telephone Encounter (Signed)
erroneous

## 2017-01-21 ENCOUNTER — Telehealth: Payer: Self-pay

## 2017-01-21 ENCOUNTER — Other Ambulatory Visit: Payer: Self-pay | Admitting: Family Medicine

## 2017-01-21 LAB — NASAL CULTURE (N/P)
MICRO NUMBER:: 81085659
RESULT:: NORMAL
SPECIMEN QUALITY:: ADEQUATE

## 2017-01-21 MED ORDER — RANITIDINE HCL 150 MG PO TABS: 150.0000 mg | ORAL_TABLET | Freq: Two times a day (BID) | ORAL | 1 refills | Status: DC

## 2017-01-21 NOTE — Telephone Encounter (Signed)
I don't see any history of Barrett's esophagus or GI bleed Please contact patient about her omeprazole request This medicine is not recommended for long-term use, as there are risks I'd like for her to step down to a less powerful medicine that is not as risky; I'm going to send in ranitidine instead of the omeprazole and just want her to know why Avoid triggers, don't eat for 3 hours before bed, etc. Thank you

## 2017-01-21 NOTE — Telephone Encounter (Signed)
Pt called asking about her results for nasal culture she had regarding MRSA. Spoke to angie Danversd she states it could be they have it growing  some more but once it's complete they will have the final results. I mention to pt once its finial Dr. lada will have either Asher Muir or I call her regarding the results.

## 2017-01-22 NOTE — Telephone Encounter (Signed)
Left detailed voicemial 

## 2017-01-23 DIAGNOSIS — E1121 Type 2 diabetes mellitus with diabetic nephropathy: Secondary | ICD-10-CM | POA: Diagnosis not present

## 2017-01-25 DIAGNOSIS — J449 Chronic obstructive pulmonary disease, unspecified: Secondary | ICD-10-CM | POA: Diagnosis not present

## 2017-02-05 ENCOUNTER — Other Ambulatory Visit: Payer: Self-pay | Admitting: Family Medicine

## 2017-02-08 NOTE — Telephone Encounter (Signed)
Cr and K+ reviewed Rx approved 

## 2017-02-25 DIAGNOSIS — J449 Chronic obstructive pulmonary disease, unspecified: Secondary | ICD-10-CM | POA: Diagnosis not present

## 2017-03-27 DIAGNOSIS — J449 Chronic obstructive pulmonary disease, unspecified: Secondary | ICD-10-CM | POA: Diagnosis not present

## 2017-04-07 ENCOUNTER — Encounter: Payer: Self-pay | Admitting: Family Medicine

## 2017-04-07 ENCOUNTER — Ambulatory Visit: Payer: Medicare Other | Admitting: Family Medicine

## 2017-04-07 DIAGNOSIS — E119 Type 2 diabetes mellitus without complications: Secondary | ICD-10-CM

## 2017-04-07 DIAGNOSIS — D75839 Thrombocytosis, unspecified: Secondary | ICD-10-CM | POA: Insufficient documentation

## 2017-04-07 DIAGNOSIS — R7989 Other specified abnormal findings of blood chemistry: Secondary | ICD-10-CM

## 2017-04-07 DIAGNOSIS — E782 Mixed hyperlipidemia: Secondary | ICD-10-CM | POA: Diagnosis not present

## 2017-04-07 DIAGNOSIS — I10 Essential (primary) hypertension: Secondary | ICD-10-CM | POA: Diagnosis not present

## 2017-04-07 DIAGNOSIS — J431 Panlobular emphysema: Secondary | ICD-10-CM

## 2017-04-07 DIAGNOSIS — K219 Gastro-esophageal reflux disease without esophagitis: Secondary | ICD-10-CM | POA: Diagnosis not present

## 2017-04-07 DIAGNOSIS — Z5181 Encounter for therapeutic drug level monitoring: Secondary | ICD-10-CM | POA: Diagnosis not present

## 2017-04-07 MED ORDER — GLUCOSE BLOOD VI STRP
ORAL_STRIP | 3 refills | Status: DC
Start: 2017-04-07 — End: 2018-03-29

## 2017-04-07 MED ORDER — OMEPRAZOLE 20 MG PO CPDR: 20.0000 mg | DELAYED_RELEASE_CAPSULE | Freq: Every day | ORAL | 1 refills | Status: DC

## 2017-04-07 MED ORDER — PRAVASTATIN SODIUM 80 MG PO TABS: 80.0000 mg | ORAL_TABLET | Freq: Every day | ORAL | 1 refills | Status: DC

## 2017-04-07 NOTE — Assessment & Plan Note (Signed)
Check A1c today; patient has been really busy but will call for eye exam

## 2017-04-07 NOTE — Assessment & Plan Note (Signed)
Check spirometry today; continue to take inhalers, oxygen 2 l/m around the clock

## 2017-04-07 NOTE — Assessment & Plan Note (Signed)
BMI >35 with diabetes, but has lost weight, praise given

## 2017-04-07 NOTE — Assessment & Plan Note (Signed)
Stop the H2 blocker, start back on PPI

## 2017-04-07 NOTE — Assessment & Plan Note (Signed)
Very mild; recheck

## 2017-04-07 NOTE — Assessment & Plan Note (Signed)
Watch kidneys and liver 

## 2017-04-07 NOTE — Assessment & Plan Note (Signed)
She'll take her medicine when she gets home; avoid extra salt

## 2017-04-07 NOTE — Progress Notes (Signed)
BP (!) 142/82   Pulse 77   Temp 98.3 F (36.8 C) (Oral)   Resp 16   Wt 220 lb (99.8 kg)   SpO2 98%   BMI 36.61 kg/m    Subjective:    Patient ID: Julie Keith, female    DOB: 01-13-1946, 71 y.o.   MRN: [id number]  HPI: Julie Keith is a 71 y.o. female  Chief Complaint  Patient presents with  . Follow-up  . Medication Refill    pt states zantac is not working   HPI Patient is here for f/u  Type 2 diabetes; no new foot problems; needs eye exam; patient will call and schedule an appt Lab Results  Component Value Date   HGBA1C 7.7 (H) 12/02/2016   She says the zantac is not working; wants to go back on omeprazole; bananas are a trigger for her; chocolate is okay; not many sodas; no coffee  HTN; not checking her BP at home; did not take her medicine this morning, missed dose and likely that's what it is up today; under stress with husband's cancer; not adding salt to food  On medicine for psoriasis  High cholesterol; avoiding fatty meats  Chronic obstructive lung disease; not seeing lung doctor any more; can't afford the rescue inhaler, someone gave her one, she just can't afford them; using oxygen via [place] around the clock; 2 l/m but bumps it up a little when doing chores and getting out of breath  Depression screen [facility] 2/9 04/07/2017 12/02/2016 08/28/2016 07/29/2016 04/08/2016  Decreased Interest 0 0 0 0 0  Down, Depressed, Hopeless 0 0 0 0 0  PHQ - 2 Score 0 0 0 0 0    Relevant past medical, surgical, family and social history reviewed Past Medical History:  Diagnosis Date  . Allergic rhinitis   . Diabetes (HCC)   . Emphysema lung (HCC)   . GERD (gastroesophageal reflux disease) 10/10/2015  . Hx of tobacco use, presenting hazards to health 04/08/2016  . Hypercholesteremia   . Hypertension   . Psoriasis 10/10/2015   Past Surgical History:  Procedure Laterality Date  . ABDOMINAL HYSTERECTOMY    . CERVICAL SPINE SURGERY    . CHOLECYSTECTOMY    . MASTECTOMY  Bilateral    Family History  Problem Relation Age of Onset  . Leukemia Mother   . Pneumonia Father   . COPD Father   . Diabetes Brother   . Cancer Brother        lung cancer  . Diabetes Sister    Social History   Tobacco Use  . Smoking status: Former Smoker    Packs/day: 2.00    Years: 30.00    Pack years: 60.00    Types: Cigarettes  . Smokeless tobacco: Never Used  . Tobacco comment: quit 2006  Substance Use Topics  . Alcohol use: No    Alcohol/week: 0.0 oz  . Drug use: No    Interim medical history since last visit reviewed. Allergies and medications reviewed  Review of Systems Per HPI unless specifically indicated above     Objective:    BP (!) 142/82   Pulse 77   Temp 98.3 F (36.8 C) (Oral)   Resp 16   Wt 220 lb (99.8 kg)   SpO2 98%   BMI 36.61 kg/m   Wt Readings from Last 3 Encounters:  04/07/17 220 lb (99.8 kg)  12/02/16 227 lb 3.2 oz (103.1 kg)  10/22/16 226 lb 8 oz (102.7 kg)  Physical Exam  Constitutional: She appears well-developed and well-nourished.  Obese, no distress; wearing supplemental oxygen via [place]; weight down 7 pounds  Eyes: No scleral icterus.  Neck: No JVD present.  Cardiovascular: Normal rate and regular rhythm.  No extrasystoles are present.  Pulmonary/Chest: Effort normal and breath sounds normal.  Abdominal: Normal appearance and bowel sounds are normal. There is no tenderness.  Musculoskeletal: She exhibits no edema.  Neurological: She is alert.  Skin: No pallor.  Dry skin on the feet  Psychiatric: Her mood appears not anxious. She does not exhibit a depressed mood.   Diabetic Foot Form - Detailed   Diabetic Foot Exam - detailed Diabetic Foot exam was performed with the following findings:  Yes 04/07/2017  8:28 AM  Visual Foot Exam completed.:  Yes  Pulse Foot Exam completed.:  Yes  Right Dorsalis Pedis:  Present Left Dorsalis Pedis:  Present  Sensory Foot Exam Completed.:  Yes Semmes-Weinstein Monofilament Test R  Site 1-Great Toe:  Pos L Site 1-Great Toe:  Pos          Assessment & Plan:   Problem List Items Addressed This Visit      Cardiovascular and Mediastinum   Essential hypertension    She'll take her medicine when she gets home; avoid extra salt      Relevant Medications   pravastatin (PRAVACHOL) 80 MG tablet     Respiratory   COPD (chronic obstructive pulmonary disease) (HCC)    Check spirometry today; continue to take inhalers, oxygen 2 l/m around the clock        Digestive   GERD (gastroesophageal reflux disease)    Stop the H2 blocker, start back on PPI      Relevant Medications   omeprazole (PRILOSEC) 20 MG capsule     Endocrine   Type 2 diabetes mellitus without complication (HCC)    Check A1c today; patient has been really busy but will call for eye exam      Relevant Medications   pravastatin (PRAVACHOL) 80 MG tablet   Other Relevant Orders   Lipid panel   Hemoglobin A1c   Microalbumin / creatinine urine ratio     Other   Morbid obesity (HCC)    BMI >35 with diabetes, but has lost weight, praise given      Medication monitoring encounter    Watch kidneys and liver      Relevant Orders   COMPLETE METABOLIC PANEL WITH GFR   Hyperlipidemia    Check lipids      Relevant Medications   pravastatin (PRAVACHOL) 80 MG tablet   Other Relevant Orders   Lipid panel   Elevated platelet count    Very mild; recheck      Relevant Orders   CBC with Differential/Platelet       Follow up plan: Return in about 3 months (around 07/06/2017) for twenty minute follow-up with fasting labs.  An after-visit summary was printed and given to the patient at check-out.  Please see the patient instructions which may contain other information and recommendations beyond what is mentioned above in the assessment and plan.  Meds ordered this encounter  Medications  . omeprazole (PRILOSEC) 20 MG capsule    Sig: Take 1 capsule (20 mg total) by mouth daily.    Dispense:   90 capsule    Refill:  1  . pravastatin (PRAVACHOL) 80 MG tablet    Sig: Take 1 tablet (80 mg total) by mouth daily.    Dispense:  90 tablet    Refill:  1  . glucose blood (ACCU-CHEK COMPACT PLUS) test strip    Sig: USE ONE STRIP TO CHECK GLUCOSE ONCE a day if needed    Dispense:  100 each    Refill:  3    ICD 10 E11.21    Orders Placed This Encounter  Procedures  . Lipid panel  . Hemoglobin A1c  . COMPLETE METABOLIC PANEL WITH GFR  . Microalbumin / creatinine urine ratio  . CBC with Differential/Platelet    She was unable to leave a urine sample and did not want to wait to try again; canceled

## 2017-04-07 NOTE — Assessment & Plan Note (Signed)
Check lipids 

## 2017-04-07 NOTE — Patient Instructions (Signed)
Keep up the good job with trying to lose weight Check out the information at familydoctor.org entitled "Nutrition for Weight Loss: What You Need to Know about Fad Diets" Try to lose between 1-2 pounds per week by taking in fewer calories and burning off more calories You can succeed by limiting portions, limiting foods dense in calories and fat, becoming more active, and drinking 8 glasses of water a day (64 ounces) Don't skip meals, especially breakfast, as skipping meals may alter your metabolism Do not use over-the-counter weight loss pills or gimmicks that claim rapid weight loss A healthy BMI (or body mass index) is between 18.5 and 24.9 You can calculate your ideal BMI at the NIH website [employer]huhttp://www.nhlbi.nih.gov/health/educational/lose_wt/BMI/bmicalc.htm Try to follow the DASH guidelines (DASH stands for Dietary Approaches to Stop Hypertension). Try to limit the sodium in your diet to no more than 1,500mg  of sodium per day. Certainly try to not exceed 2,000 mg per day at the very most. Do not add salt when cooking or at the table.  Check the sodium amount on labels when shopping, and choose items lower in sodium when given a choice. Avoid or limit foods that already contain a lot of sodium. Eat a diet rich in fruits and vegetables and whole grains, and try to lose weight if overweight or obese Try to limit saturated fats in your diet (bologna, hot dogs, barbeque, cheeseburgers, hamburgers, steak, bacon, sausage, cheese, etc.) and get more fresh fruits, vegetables, and whole grains

## 2017-04-08 LAB — COMPLETE METABOLIC PANEL WITH GFR
AG RATIO: 1.2 (calc) (ref 1.0–2.5)
ALBUMIN MSPROF: 4.1 g/dL (ref 3.6–5.1)
ALT: 11 U/L (ref 6–29)
AST: 16 U/L (ref 10–35)
Alkaline phosphatase (APISO): 90 U/L (ref 33–130)
BILIRUBIN TOTAL: 0.5 mg/dL (ref 0.2–1.2)
BUN / CREAT RATIO: 15 (calc) (ref 6–22)
BUN: 17 mg/dL (ref 7–25)
CHLORIDE: 99 mmol/L (ref 98–110)
CO2: 31 mmol/L (ref 20–32)
Calcium: 9.7 mg/dL (ref 8.6–10.4)
Creat: 1.15 mg/dL — ABNORMAL HIGH (ref 0.60–0.93)
GFR, EST AFRICAN AMERICAN: 55 mL/min/{1.73_m2} — AB (ref >=60)
GFR, Est Non African American: 47 mL/min/{1.73_m2} — ABNORMAL LOW (ref >=60)
Globulin: 3.4 g/dL (calc) (ref 1.9–3.7)
Glucose, Bld: 160 mg/dL — ABNORMAL HIGH (ref 65–99)
POTASSIUM: 5.4 mmol/L — AB (ref 3.5–5.3)
Sodium: 139 mmol/L (ref 135–146)
TOTAL PROTEIN: 7.5 g/dL (ref 6.1–8.1)

## 2017-04-08 LAB — CBC WITH DIFFERENTIAL/PLATELET
BASOS ABS: 73 {cells}/uL (ref 0–200)
Basophils Relative: 0.6 %
EOS ABS: 110 {cells}/uL (ref 15–500)
Eosinophils Relative: 0.9 %
HEMATOCRIT: 40.1 % (ref 35.0–45.0)
Hemoglobin: 13.1 g/dL (ref 11.7–15.5)
LYMPHS ABS: 3355 {cells}/uL (ref 850–3900)
MCH: 28.1 pg (ref 27.0–33.0)
MCHC: 32.7 g/dL (ref 32.0–36.0)
MCV: 86.1 fL (ref 80.0–100.0)
MPV: 9.8 fL (ref 7.5–12.5)
Monocytes Relative: 6.5 %
NEUTROS PCT: 64.5 %
Neutro Abs: 7869 cells/uL — ABNORMAL HIGH (ref 1500–7800)
Platelets: 426 10*3/uL — ABNORMAL HIGH (ref 140–400)
RBC: 4.66 10*6/uL (ref 3.80–5.10)
RDW: 13.5 % (ref 11.0–15.0)
Total Lymphocyte: 27.5 %
WBC: 12.2 10*3/uL — ABNORMAL HIGH (ref 3.8–10.8)
WBCMIX: 793 {cells}/uL (ref 200–950)

## 2017-04-08 LAB — HEMOGLOBIN A1C
Hgb A1c MFr Bld: 7.2 % of total Hgb — ABNORMAL HIGH (ref <5.7)
Mean Plasma Glucose: 160 (calc)
eAG (mmol/L): 8.9 (calc)

## 2017-04-08 LAB — LIPID PANEL
Cholesterol: 157 mg/dL (ref <200)
HDL: 59 mg/dL (ref >50)
LDL Cholesterol (Calc): 79 mg/dL (calc)
Non-HDL Cholesterol (Calc): 98 mg/dL (calc) (ref <130)
TRIGLYCERIDES: 104 mg/dL (ref <150)
Total CHOL/HDL Ratio: 2.7 (calc) (ref <5.0)

## 2017-04-09 ENCOUNTER — Encounter: Payer: Self-pay | Admitting: Family Medicine

## 2017-04-27 DIAGNOSIS — J449 Chronic obstructive pulmonary disease, unspecified: Secondary | ICD-10-CM | POA: Diagnosis not present

## 2017-05-13 ENCOUNTER — Telehealth: Payer: Self-pay | Admitting: Internal Medicine

## 2017-05-13 NOTE — Telephone Encounter (Signed)
Patient not interested in fu .  Per request deleting recall.

## 2017-05-28 DIAGNOSIS — J449 Chronic obstructive pulmonary disease, unspecified: Secondary | ICD-10-CM | POA: Diagnosis not present

## 2017-06-25 DIAGNOSIS — J449 Chronic obstructive pulmonary disease, unspecified: Secondary | ICD-10-CM | POA: Diagnosis not present

## 2017-07-26 DIAGNOSIS — J449 Chronic obstructive pulmonary disease, unspecified: Secondary | ICD-10-CM | POA: Diagnosis not present

## 2017-08-03 DIAGNOSIS — E1121 Type 2 diabetes mellitus with diabetic nephropathy: Secondary | ICD-10-CM | POA: Diagnosis not present

## 2017-08-06 ENCOUNTER — Encounter: Payer: Self-pay | Admitting: Family Medicine

## 2017-08-06 ENCOUNTER — Ambulatory Visit: Payer: Medicare Other | Admitting: Family Medicine

## 2017-08-06 DIAGNOSIS — I10 Essential (primary) hypertension: Secondary | ICD-10-CM

## 2017-08-06 DIAGNOSIS — Z5181 Encounter for therapeutic drug level monitoring: Secondary | ICD-10-CM

## 2017-08-06 DIAGNOSIS — J431 Panlobular emphysema: Secondary | ICD-10-CM | POA: Diagnosis not present

## 2017-08-06 DIAGNOSIS — E119 Type 2 diabetes mellitus without complications: Secondary | ICD-10-CM

## 2017-08-06 DIAGNOSIS — E782 Mixed hyperlipidemia: Secondary | ICD-10-CM

## 2017-08-06 MED ORDER — PRAVASTATIN SODIUM 80 MG PO TABS: 80.0000 mg | ORAL_TABLET | Freq: Every day | ORAL | 1 refills | Status: DC

## 2017-08-06 MED ORDER — ENALAPRIL MALEATE 20 MG PO TABS: 20.0000 mg | ORAL_TABLET | Freq: Two times a day (BID) | ORAL | 1 refills | Status: DC

## 2017-08-06 NOTE — Assessment & Plan Note (Signed)
Try to limit saturated fats; check lipids

## 2017-08-06 NOTE — Progress Notes (Signed)
BP 132/72 (BP Location: Right Arm, Patient Position: Sitting, Cuff Size: Large)   Pulse 94   Temp 98.5 F (36.9 C) (Oral)   Ht 5\' 5"  (1.651 m)   Wt 221 lb 1.6 oz (100.3 kg)   SpO2 95%   BMI 36.79 kg/m    Subjective:    Patient ID: Julie BeetsMary P Thoen, female    DOB: 03-06-1946, 72 y.o.   MRN: [id number]  HPI: Julie Keith is a 72 y.o. female  Chief Complaint  Patient presents with  . Follow-up  . Medication Refill    HPI Patient is here for f/u  COPD: on oxygen, around the clock 2 l/m; not seeing pulmonologist HTN: checking BP at home once in a while; "it's always reasonable"; no highs and no lows; the medicine is working real good DM: checking FSBS once a day; highest 116 in the last 2 weeks, lowest maybe 110; occasional can of Coke; due for eye exam, but she is going to wait until July, waiting until husband is done with chemo; does not plan ahead enough; no problems with her feet Goes back to Dr. Kirke CorinArida in July; he started her on metoprolol  [facility] 08/06/2017 04/07/2017 12/02/2016 08/28/2016 07/29/2016  Decreased Interest 0 0 0 0 0  Down, Depressed, Hopeless 0 0 0 0 0  PHQ - 2 Score 0 0 0 0 0    Relevant past medical, surgical, family and social history reviewed Past Medical History:  Diagnosis Date  . Allergic rhinitis   . Diabetes (HCC)   . Emphysema lung (HCC)   . GERD (gastroesophageal reflux disease) 10/10/2015  . Hx of tobacco use, presenting hazards to health 04/08/2016  . Hypercholesteremia   . Hypertension   . Psoriasis 10/10/2015   Past Surgical History:  Procedure Laterality Date  . ABDOMINAL HYSTERECTOMY    . battery in back: astum    . CERVICAL SPINE SURGERY    . CHOLECYSTECTOMY    . MASTECTOMY Bilateral    Family History  Problem Relation Age of Onset  . Leukemia Mother   . Pneumonia Father   . COPD Father   . Diabetes Brother   . Cancer Brother        lung cancer  . Diabetes Sister    Social History   Tobacco Use  .  Smoking status: Former Smoker    Packs/day: 2.00    Years: 30.00    Pack years: 60.00    Types: Cigarettes  . Smokeless tobacco: Never Used  . Tobacco comment: quit 2006  Substance Use Topics  . Alcohol use: No    Alcohol/week: 0.0 oz  . Drug use: No    Interim medical history since last visit reviewed. Allergies and medications reviewed  Review of Systems Per HPI unless specifically indicated above     Objective:    BP 132/72 (BP Location: Right Arm, Patient Position: Sitting, Cuff Size: Large)   Pulse 94   Temp 98.5 F (36.9 C) (Oral)   Ht 5\' 5"  (1.651 m)   Wt 221 lb 1.6 oz (100.3 kg)   SpO2 95%   BMI 36.79 kg/m   Wt Readings from Last 3 Encounters:  08/06/17 221 lb 1.6 oz (100.3 kg)  04/07/17 220 lb (99.8 kg)  12/02/16 227 lb 3.2 oz (103.1 kg)    Physical Exam  Constitutional: She appears well-developed and well-nourished. No distress.  HENT:  Head: Normocephalic and atraumatic.  Eyes: EOM are normal. No scleral icterus.  Neck: No thyromegaly present.  Cardiovascular: Normal rate, regular rhythm and normal heart sounds.  No murmur heard. Pulmonary/Chest: Effort normal and breath sounds normal. No respiratory distress. She has no wheezes.  Abdominal: Soft. Bowel sounds are normal. She exhibits no distension.  Musculoskeletal: Normal range of motion. She exhibits no edema.  Neurological: She is alert. She exhibits normal muscle tone.  Skin: Skin is warm and dry. She is not diaphoretic. No pallor.  Psychiatric: She has a normal mood and affect. Her behavior is normal. Judgment and thought content normal.   Diabetic Foot Form - Detailed   Diabetic Foot Exam - detailed Diabetic Foot exam was performed with the following findings:  Yes 08/06/2017  8:17 AM  Visual Foot Exam completed.:  Yes  Pulse Foot Exam completed.:  Yes  Right Dorsalis Pedis:  Present Left Dorsalis Pedis:  Present  Sensory Foot Exam Completed.:  Yes Semmes-Weinstein Monofilament Test R Site  1-Great Toe:  Pos L Site 1-Great Toe:  Pos        Results for orders placed or performed in visit on 04/07/17  Lipid panel  Result Value Ref Range   Cholesterol 157 <200 mg/dL   HDL 59 >69 mg/dL   Triglycerides 629 <528 mg/dL   LDL Cholesterol (Calc) 79 mg/dL (calc)   Total CHOL/HDL Ratio 2.7 <5.0 (calc)   Non-HDL Cholesterol (Calc) 98 <413 mg/dL (calc)  Hemoglobin K4M  Result Value Ref Range   Hgb A1c MFr Bld 7.2 (H) <5.7 % of total Hgb   Mean Plasma Glucose 160 (calc)   eAG (mmol/L) 8.9 (calc)  COMPLETE METABOLIC PANEL WITH GFR  Result Value Ref Range   Glucose, Bld 160 (H) 65 - 99 mg/dL   BUN 17 7 - 25 mg/dL   Creat 0.10 (H) 2.72 - 0.93 mg/dL   GFR, Est Non African American 47 (L) > OR = 60 mL/min/1.54m2   GFR, Est African American 55 (L) > OR = 60 mL/min/1.66m2   BUN/Creatinine Ratio 15 6 - 22 (calc)   Sodium 139 135 - 146 mmol/L   Potassium 5.4 (H) 3.5 - 5.3 mmol/L   Chloride 99 98 - 110 mmol/L   CO2 31 20 - 32 mmol/L   Calcium 9.7 8.6 - 10.4 mg/dL   Total Protein 7.5 6.1 - 8.1 g/dL   Albumin 4.1 3.6 - 5.1 g/dL   Globulin 3.4 1.9 - 3.7 g/dL (calc)   AG Ratio 1.2 1.0 - 2.5 (calc)   Total Bilirubin 0.5 0.2 - 1.2 mg/dL   Alkaline phosphatase (APISO) 90 33 - 130 U/L   AST 16 10 - 35 U/L   ALT 11 6 - 29 U/L  CBC with Differential/Platelet  Result Value Ref Range   WBC 12.2 (H) 3.8 - 10.8 Thousand/uL   RBC 4.66 3.80 - 5.10 Million/uL   Hemoglobin 13.1 11.7 - 15.5 g/dL   HCT 53.6 64.4 - 03.4 %   MCV 86.1 80.0 - 100.0 fL   MCH 28.1 27.0 - 33.0 pg   MCHC 32.7 32.0 - 36.0 g/dL   RDW 74.2 59.5 - 63.8 %   Platelets 426 (H) 140 - 400 Thousand/uL   MPV 9.8 7.5 - 12.5 fL   Neutro Abs 7,869 (H) 1,500 - 7,800 cells/uL   Lymphs Abs 3,355 850 - 3,900 cells/uL   WBC mixed population 793 200 - 950 cells/uL   Eosinophils Absolute 110 15 - 500 cells/uL   Basophils Absolute 73 0 - 200 cells/uL   Neutrophils Relative %  64.5 %   Total Lymphocyte 27.5 %   Monocytes Relative 6.5 %    Eosinophils Relative 0.9 %   Basophils Relative 0.6 %      Assessment & Plan:   Problem List Items Addressed This Visit      Cardiovascular and Mediastinum   Essential hypertension (Chronic)    Well-controlled; continue meds; try to avoid extra salt; avoid salt substittues      Relevant Medications   enalapril (VASOTEC) 20 MG tablet   pravastatin (PRAVACHOL) 80 MG tablet     Respiratory   COPD (chronic obstructive pulmonary disease) (HCC) (Chronic)    Continue oxygen        Endocrine   Type 2 diabetes mellitus without complication (HCC) (Chronic)    Encourage eye exam; foot exam by MD today; check A1c; limit sweets      Relevant Medications   enalapril (VASOTEC) 20 MG tablet   pravastatin (PRAVACHOL) 80 MG tablet   Other Relevant Orders   Microalbumin / creatinine urine ratio   Lipid panel   Hemoglobin A1c     Other   Morbid obesity (HCC) (Chronic)    Encouraged weight loss      Medication monitoring encounter    Check labs      Relevant Orders   COMPLETE METABOLIC PANEL WITH GFR   Hyperlipidemia (Chronic)    Try to limit saturated fats; check lipids      Relevant Medications   enalapril (VASOTEC) 20 MG tablet   pravastatin (PRAVACHOL) 80 MG tablet   Other Relevant Orders   Lipid panel       Follow up plan: Return for follow-up visit with Dr. Sherie Don.  An after-visit summary was printed and given to the patient at check-out.  Please see the patient instructions which may contain other information and recommendations beyond what is mentioned above in the assessment and plan.  Meds ordered this encounter  Medications  . enalapril (VASOTEC) 20 MG tablet    Sig: Take 1 tablet (20 mg total) by mouth 2 (two) times daily.    Dispense:  180 tablet    Refill:  1  . pravastatin (PRAVACHOL) 80 MG tablet    Sig: Take 1 tablet (80 mg total) by mouth daily.    Dispense:  90 tablet    Refill:  1    Orders Placed This Encounter  Procedures  . Microalbumin  / creatinine urine ratio  . Lipid panel  . Hemoglobin A1c  . COMPLETE METABOLIC PANEL WITH GFR

## 2017-08-06 NOTE — Assessment & Plan Note (Signed)
Continue oxygen. 

## 2017-08-06 NOTE — Patient Instructions (Signed)
Check out the information at familydoctor.org entitled "Nutrition for Weight Loss: What You Need to Know about Fad Diets" Try to lose between 1-2 pounds per week by taking in fewer calories and burning off more calories You can succeed by limiting portions, limiting foods dense in calories and fat, becoming more active, and drinking 8 glasses of water a day (64 ounces) Don't skip meals, especially breakfast, as skipping meals may alter your metabolism Do not use over-the-counter weight loss pills or gimmicks that claim rapid weight loss A healthy BMI (or body mass index) is between 18.5 and 24.9 You can calculate your ideal BMI at the NIH website http://www.nhlbi.nih.gov/health/educational/lose_wt/BMI/bmicalc.htm Try to follow the DASH guidelines (DASH stands for Dietary Approaches to Stop Hypertension). Try to limit the sodium in your diet to no more than 1,500mg of sodium per day. Certainly try to not exceed 2,000 mg per day at the very most. Do not add salt when cooking or at the table.  Check the sodium amount on labels when shopping, and choose items lower in sodium when given a choice. Avoid or limit foods that already contain a lot of sodium. Eat a diet rich in fruits and vegetables and whole grains, and try to lose weight if overweight or obese  

## 2017-08-06 NOTE — Assessment & Plan Note (Signed)
Well-controlled; continue meds; try to avoid extra salt; avoid salt substittues

## 2017-08-06 NOTE — Assessment & Plan Note (Signed)
Encourage eye exam; foot exam by MD today; check A1c; limit sweets

## 2017-08-06 NOTE — Assessment & Plan Note (Signed)
Check labs 

## 2017-08-06 NOTE — Assessment & Plan Note (Signed)
Encouraged weight loss 

## 2017-08-07 LAB — COMPLETE METABOLIC PANEL WITH GFR
AG Ratio: 1.5 (calc) (ref 1.0–2.5)
ALKALINE PHOSPHATASE (APISO): 74 U/L (ref 33–130)
ALT: 9 U/L (ref 6–29)
AST: 14 U/L (ref 10–35)
Albumin: 4 g/dL (ref 3.6–5.1)
BUN/Creatinine Ratio: 13 (calc) (ref 6–22)
BUN: 14 mg/dL (ref 7–25)
CHLORIDE: 101 mmol/L (ref 98–110)
CO2: 29 mmol/L (ref 20–32)
CREATININE: 1.09 mg/dL — AB (ref 0.60–0.93)
Calcium: 9.3 mg/dL (ref 8.6–10.4)
GFR, Est African American: 58 mL/min/{1.73_m2} — ABNORMAL LOW (ref >=60)
GFR, Est Non African American: 50 mL/min/{1.73_m2} — ABNORMAL LOW (ref >=60)
GLOBULIN: 2.7 g/dL (ref 1.9–3.7)
GLUCOSE: 245 mg/dL — AB (ref 65–139)
Potassium: 4.7 mmol/L (ref 3.5–5.3)
SODIUM: 137 mmol/L (ref 135–146)
Total Bilirubin: 0.3 mg/dL (ref 0.2–1.2)
Total Protein: 6.7 g/dL (ref 6.1–8.1)

## 2017-08-07 LAB — MICROALBUMIN / CREATININE URINE RATIO
Creatinine, Urine: 224 mg/dL (ref 20–275)
Microalb Creat Ratio: 12 mcg/mg creat (ref <30)
Microalb, Ur: 2.7 mg/dL

## 2017-08-07 LAB — LIPID PANEL
CHOL/HDL RATIO: 2.8 (calc) (ref <5.0)
CHOLESTEROL: 152 mg/dL (ref <200)
HDL: 54 mg/dL (ref >50)
LDL CHOLESTEROL (CALC): 78 mg/dL
Non-HDL Cholesterol (Calc): 98 mg/dL (calc) (ref <130)
Triglycerides: 112 mg/dL (ref <150)

## 2017-08-07 LAB — HEMOGLOBIN A1C
EAG (MMOL/L): 9.3 (calc)
Hgb A1c MFr Bld: 7.5 % of total Hgb — ABNORMAL HIGH (ref <5.7)
MEAN PLASMA GLUCOSE: 169 (calc)

## 2017-08-12 ENCOUNTER — Encounter: Payer: Self-pay | Admitting: Family Medicine

## 2017-08-12 MED ORDER — PIOGLITAZONE HCL 15 MG PO TABS
15.0000 mg | ORAL_TABLET | Freq: Every day | ORAL | 5 refills | Status: DC
Start: 2017-08-12 — End: 2017-10-08

## 2017-08-25 DIAGNOSIS — J449 Chronic obstructive pulmonary disease, unspecified: Secondary | ICD-10-CM | POA: Diagnosis not present

## 2017-09-25 DIAGNOSIS — J449 Chronic obstructive pulmonary disease, unspecified: Secondary | ICD-10-CM | POA: Diagnosis not present

## 2017-09-26 ENCOUNTER — Other Ambulatory Visit: Payer: Self-pay | Admitting: Family Medicine

## 2017-10-08 ENCOUNTER — Other Ambulatory Visit: Payer: Self-pay

## 2017-10-08 MED ORDER — PIOGLITAZONE HCL 15 MG PO TABS: 15.0000 mg | ORAL_TABLET | Freq: Every day | ORAL | 3 refills | Status: DC

## 2017-10-08 NOTE — Telephone Encounter (Signed)
90 day supply

## 2017-10-22 ENCOUNTER — Ambulatory Visit: Payer: Medicare Other | Admitting: Cardiovascular Disease

## 2017-10-22 ENCOUNTER — Encounter: Payer: Self-pay | Admitting: Cardiovascular Disease

## 2017-10-22 VITALS — BP 166/72 | HR 65 | Ht 65.0 in | Wt 220.0 lb

## 2017-10-22 DIAGNOSIS — I491 Atrial premature depolarization: Secondary | ICD-10-CM | POA: Diagnosis not present

## 2017-10-22 DIAGNOSIS — I10 Essential (primary) hypertension: Secondary | ICD-10-CM | POA: Diagnosis not present

## 2017-10-22 NOTE — Patient Instructions (Addendum)
Medication Instructions:  Your physician has recommended you make the following change in your medication:  1. DECREASE Metoprolol down to 12.5 mg once daily for 2 days then discontinue   Follow-Up: Your physician recommends that you schedule a follow-up appointment as needed.  It was a pleasure seeing you today here in the office. Please do not hesitate to give us a call back if you have any further questions. [phone]  [place] CellarPamela A. RN, BSN

## 2017-10-22 NOTE — Progress Notes (Signed)
Cardiology Office Note   Date:  10/22/2017   ID:  Julie, Keith 07-11-1945, MRN [id number]  PCP:  Kerman Passey, MD  Cardiologist:   Lorine Bears, MD   Chief Complaint  Patient presents with  . Other    12 month follow up. Patient denies chest pain and SOB. Meds reviewed verbally with patient.       History of Present Illness: Julie Keith is a 72 y.o. female who presents for a follow-up visit. She was seen by Dr. Alvino Chapel in 2018 for evaluation of possible atrial fibrillation. She also had chest pain. Her EKG was read by the machine as atrial fibrillation but that was sinus rhythm with PACs. She has chronic medical conditions that include diabetes mellitus, hypertension, previous tobacco use and COPD.  She underwent a Holter monitor in 2018 which showed sinus rhythm with 2 episodes of supraventricular tachycardia likely atrial tachycardia. She was started on metoprolol 25 mg once daily. A stress test and echocardiogram were suggested but were declined by the patient for financial reasons.  She reports no further palpitations or chest pain.  She has chronic exertional dyspnea related to COPD and she continues to use oxygen 2 L/min.  She wants to come off metoprolol if possible.  Past Medical History:  Diagnosis Date  . Allergic rhinitis   . Diabetes (HCC)   . Emphysema lung (HCC)   . GERD (gastroesophageal reflux disease) 10/10/2015  . Hx of tobacco use, presenting hazards to health 04/08/2016  . Hypercholesteremia   . Hypertension   . Psoriasis 10/10/2015    Past Surgical History:  Procedure Laterality Date  . ABDOMINAL HYSTERECTOMY    . battery in back: astum    . CERVICAL SPINE SURGERY    . CHOLECYSTECTOMY    . MASTECTOMY Bilateral      Current Outpatient Medications  Medication Sig Dispense Refill  . albuterol (PROVENTIL HFA;VENTOLIN HFA) 108 (90 Base) MCG/ACT inhaler Inhale 2 puffs into the lungs every 6 (six) hours as needed for wheezing or shortness of  breath.    . Apremilast 30 MG TABS Take 30 mg by mouth 2 (two) times daily.    Marland Kitchen aspirin EC 81 MG tablet Take 1 tablet (81 mg total) by mouth daily. 90 tablet 3  . budesonide (PULMICORT) 0.5 MG/2ML nebulizer solution Take 0.5 mg by nebulization as needed.     . enalapril (VASOTEC) 20 MG tablet Take 1 tablet (20 mg total) by mouth 2 (two) times daily. 180 tablet 1  . glimepiride (AMARYL) 4 MG tablet Take 1 tablet (4 mg total) by mouth daily before breakfast. 90 tablet 3  . glucose blood (ACCU-CHEK COMPACT PLUS) test strip USE ONE STRIP TO CHECK GLUCOSE ONCE a day if needed 100 each 3  . omeprazole (PRILOSEC) 20 MG capsule TAKE 1 CAPSULE BY MOUTH ONCE DAILY 90 capsule 1  . pioglitazone (ACTOS) 15 MG tablet Take 1 tablet (15 mg total) by mouth daily. For diabetes 90 tablet 3  . pravastatin (PRAVACHOL) 80 MG tablet Take 1 tablet (80 mg total) by mouth daily. 90 tablet 1  . triamcinolone ointment (KENALOG) 0.1 % Apply topically as needed.  1   No current facility-administered medications for this visit.     Allergies:   Patient has no known allergies.    Social History:  The patient  reports that she has quit smoking. Her smoking use included cigarettes. She has a 60.00 pack-year smoking history. She has never used  smokeless tobacco. She reports that she does not drink alcohol or use drugs.   Family History:  The patient's family history includes COPD in her father; Cancer in her brother; Diabetes in her brother and sister; Leukemia in her mother; Pneumonia in her father.    ROS:  Please see the history of present illness.   Otherwise, review of systems are positive for none.   All other systems are reviewed and negative.    PHYSICAL EXAM: VS:  BP (!) 166/72 (BP Location: Left Arm, Patient Position: Sitting, Cuff Size: Normal)   Pulse 65   Ht 5\' 5"  (1.651 m)   Wt 220 lb (99.8 kg)   BMI 36.61 kg/m  , BMI Body mass index is 36.61 kg/m. GEN: Well nourished, well developed, in no acute  distress  HEENT: normal  Neck: no JVD, carotid bruits, or masses Cardiac: RRR; no  rubs, or gallops,no edema . No murmurs Respiratory:  clear to auscultation bilaterally, normal work of breathing GI: soft, nontender, nondistended, + BS MS: no deformity or atrophy  Skin: warm and dry, no rash Neuro:  Strength and sensation are intact Psych: euthymic mood, full affect   EKG:  EKG is ordered today. EKG showed normal sinus rhythm with sinus arrhythmia.  No evidence of atrial fibrillation.  No significant ST or T wave changes.   Recent Labs: 04/07/2017: Hemoglobin 13.1; Platelets 426 08/06/2017: ALT 9; BUN 14; Creat 1.09; Potassium 4.7; Sodium 137    Lipid Panel    Component Value Date/Time   CHOL 152 08/06/2017 0834   CHOL 205 (H) 03/13/2015 0932   TRIG 112 08/06/2017 0834   HDL 54 08/06/2017 0834   HDL 52 03/13/2015 0932   CHOLHDL 2.8 08/06/2017 0834   VLDL 20 07/29/2016 0842   LDLCALC 78 08/06/2017 0834      Wt Readings from Last 3 Encounters:  10/22/17 220 lb (99.8 kg)  08/06/17 221 lb 1.6 oz (100.3 kg)  04/07/17 220 lb (99.8 kg)       PAD Screen 08/11/2016  Previous PAD dx? No  Previous surgical procedure? No  Pain with walking? No  Feet/toe relief with dangling? Yes  Painful, non-healing ulcers? No  Extremities discolored? No      ASSESSMENT AND PLAN:  1.  Palpitations: PACs and short runs of SVT were noted.  She seems to be completely asymptomatic and she wants to come off metoprolol if possible.  I think that is reasonable given that she has not had any documented arrhythmia over the last year.  I asked her to decrease Toprol to 12.5 mg once daily for 2 days and then stop the medication.  If she has recurrent symptoms, we can consider resuming the medication.  2. Essential hypertension:Blood pressure is mildly elevated but she has not taken her enalapril today.  Continue to monitor and consider adding another antihypertensive medication if  needed.   Disposition:   FU with me as needed  Signed,  Lorine BearsMuhammad Arida, MD  10/22/2017 8:18 AM    [facility]

## 2017-10-25 DIAGNOSIS — J449 Chronic obstructive pulmonary disease, unspecified: Secondary | ICD-10-CM | POA: Diagnosis not present

## 2017-11-01 DIAGNOSIS — E119 Type 2 diabetes mellitus without complications: Secondary | ICD-10-CM | POA: Diagnosis not present

## 2017-11-01 LAB — HM DIABETES EYE EXAM

## 2017-11-03 DIAGNOSIS — E1121 Type 2 diabetes mellitus with diabetic nephropathy: Secondary | ICD-10-CM | POA: Diagnosis not present

## 2017-11-10 ENCOUNTER — Encounter: Payer: Self-pay | Admitting: Cardiovascular Disease

## 2017-11-18 ENCOUNTER — Other Ambulatory Visit: Payer: Self-pay | Admitting: Cardiovascular Disease

## 2017-11-19 ENCOUNTER — Other Ambulatory Visit: Payer: Self-pay | Admitting: *Deleted

## 2017-11-19 MED ORDER — METOPROLOL SUCCINATE ER 25 MG PO TB24: 25.0000 mg | ORAL_TABLET | Freq: Every day | ORAL | 3 refills | Status: DC

## 2017-11-25 DIAGNOSIS — J449 Chronic obstructive pulmonary disease, unspecified: Secondary | ICD-10-CM | POA: Diagnosis not present

## 2017-12-06 ENCOUNTER — Ambulatory Visit: Payer: Medicare Other | Admitting: Family Medicine

## 2017-12-26 DIAGNOSIS — J449 Chronic obstructive pulmonary disease, unspecified: Secondary | ICD-10-CM | POA: Diagnosis not present

## 2017-12-28 ENCOUNTER — Other Ambulatory Visit: Payer: Self-pay | Admitting: Family Medicine

## 2018-01-05 ENCOUNTER — Other Ambulatory Visit: Payer: Self-pay | Admitting: Family Medicine

## 2018-01-10 ENCOUNTER — Encounter: Payer: Self-pay | Admitting: Family Medicine

## 2018-01-20 DIAGNOSIS — Z79899 Other long term (current) drug therapy: Secondary | ICD-10-CM | POA: Diagnosis not present

## 2018-01-20 DIAGNOSIS — L400 Psoriasis vulgaris: Secondary | ICD-10-CM | POA: Diagnosis not present

## 2018-01-25 DIAGNOSIS — J449 Chronic obstructive pulmonary disease, unspecified: Secondary | ICD-10-CM | POA: Diagnosis not present

## 2018-01-26 ENCOUNTER — Ambulatory Visit: Payer: Medicare Other | Admitting: Family Medicine

## 2018-01-26 ENCOUNTER — Encounter: Payer: Self-pay | Admitting: Family Medicine

## 2018-01-26 VITALS — BP 136/70 | HR 79 | Temp 98.0°F | Ht 65.0 in | Wt 221.2 lb

## 2018-01-26 DIAGNOSIS — J9611 Chronic respiratory failure with hypoxia: Secondary | ICD-10-CM

## 2018-01-26 DIAGNOSIS — E119 Type 2 diabetes mellitus without complications: Secondary | ICD-10-CM

## 2018-01-26 DIAGNOSIS — R7989 Other specified abnormal findings of blood chemistry: Secondary | ICD-10-CM

## 2018-01-26 DIAGNOSIS — I10 Essential (primary) hypertension: Secondary | ICD-10-CM | POA: Diagnosis not present

## 2018-01-26 DIAGNOSIS — Z23 Encounter for immunization: Secondary | ICD-10-CM | POA: Diagnosis not present

## 2018-01-26 DIAGNOSIS — E782 Mixed hyperlipidemia: Secondary | ICD-10-CM | POA: Diagnosis not present

## 2018-01-26 DIAGNOSIS — Z5181 Encounter for therapeutic drug level monitoring: Secondary | ICD-10-CM | POA: Diagnosis not present

## 2018-01-26 DIAGNOSIS — J431 Panlobular emphysema: Secondary | ICD-10-CM

## 2018-01-26 DIAGNOSIS — L409 Psoriasis, unspecified: Secondary | ICD-10-CM

## 2018-01-26 MED ORDER — GLIMEPIRIDE 4 MG PO TABS: 2.0000 mg | ORAL_TABLET | Freq: Every day | ORAL | 3 refills | Status: DC

## 2018-01-26 NOTE — Assessment & Plan Note (Signed)
BMI > 35 with diabetes and high chol and HTN; working on weight

## 2018-01-26 NOTE — Assessment & Plan Note (Signed)
Seeing dermatologist; she knows which topicals to avoid using on the fce

## 2018-01-26 NOTE — Assessment & Plan Note (Signed)
Continue statin. 

## 2018-01-26 NOTE — Assessment & Plan Note (Signed)
Continue symbicort, oxygen via [place]

## 2018-01-26 NOTE — Assessment & Plan Note (Signed)
Foot exam by MD today; eye exams yearly; check A1c; so glad she is working on eating better and trying to lose weight

## 2018-01-26 NOTE — Assessment & Plan Note (Signed)
Controlled on two agents; continue meds; she is eating better and working on weight loss

## 2018-01-26 NOTE — Progress Notes (Signed)
BP 136/70   Pulse 79   Temp 98 F (36.7 C)   Ht 5\' 5"  (1.651 m)   Wt 221 lb 3.2 oz (100.3 kg)   SpO2 98%   BMI 36.81 kg/m    Subjective:    Patient ID: Julie Keith, female    DOB: 20-Aug-1945, 72 y.o.   MRN: [id number]  HPI: Julie Keith is a 72 y.o. female  Chief Complaint  Patient presents with  . Follow-up    HPI Here for f/u Type 2 dm; no new problems with feet; sugar this morning was 94; highest sugar in the last week 120; no real low sugars, lowest in the 80s; really gave up eating bread; really eating better  Obesity; she has been working on weight loss; eating right now; able to wear pants she hasn't worn in a long time  Brother has lung cancer; older sister has some sort of bone cancer and she has relapsed; husband has colon and lung cancer  Psoriasis; just skin changes, no arthritis; seeing dermatologist, using several different creams for different spots; along hair line, right shoulder; knows which are too strong for the face  High cholesterol; on pravastatin; eating better and working on weight loss; some cheese  HTN; controlled with medicine; checks BP and gets 120s and 130s; on beta-blocker and ACE-I  COPD; managed here, she did not want to return to pulm; on supplemental oxygen 24 hours; using Pulmicort nebs  Depression screen [facility] 2/9 01/26/2018 08/06/2017 04/07/2017 12/02/2016 08/28/2016  Decreased Interest 0 0 0 0 0  Down, Depressed, Hopeless 0 0 0 0 0  PHQ - 2 Score 0 0 0 0 0  Altered sleeping 0 - - - -  Tired, decreased energy 0 - - - -  Change in appetite 0 - - - -  Feeling bad or failure about yourself  0 - - - -  Trouble concentrating 0 - - - -  Moving slowly or fidgety/restless 0 - - - -  Suicidal thoughts 0 - - - -  PHQ-9 Score 0 - - - -  Difficult doing work/chores Not difficult at all - - - -   Fall Risk  01/26/2018 08/06/2017 04/07/2017 12/02/2016 08/28/2016  Falls in the past year? No No No No No    Relevant past medical, surgical,  family and social history reviewed Past Medical History:  Diagnosis Date  . Allergic rhinitis   . Diabetes (HCC)   . Emphysema lung (HCC)   . GERD (gastroesophageal reflux disease) 10/10/2015  . Hx of tobacco use, presenting hazards to health 04/08/2016  . Hypercholesteremia   . Hypertension   . Psoriasis 10/10/2015   Past Surgical History:  Procedure Laterality Date  . ABDOMINAL HYSTERECTOMY    . battery in back: astum    . CERVICAL SPINE SURGERY    . CHOLECYSTECTOMY    . MASTECTOMY Bilateral    Family History  Problem Relation Age of Onset  . Leukemia Mother   . Pneumonia Father   . COPD Father   . Diabetes Brother   . Cancer Brother        lung cancer  . Diabetes Sister    Social History   Tobacco Use  . Smoking status: Former Smoker    Packs/day: 2.00    Years: 30.00    Pack years: 60.00    Types: Cigarettes  . Smokeless tobacco: Never Used  . Tobacco comment: quit 2006  Substance Use Topics  .  Alcohol use: No    Alcohol/week: 0.0 standard drinks  . Drug use: No     Office Visit from 01/26/2018 in [facility]  AUDIT-C Score  0      Interim medical history since last visit reviewed. Allergies and medications reviewed  Review of Systems Per HPI unless specifically indicated above     Objective:    BP 136/70   Pulse 79   Temp 98 F (36.7 C)   Ht 5\' 5"  (1.651 m)   Wt 221 lb 3.2 oz (100.3 kg)   SpO2 98%   BMI 36.81 kg/m   Wt Readings from Last 3 Encounters:  01/26/18 221 lb 3.2 oz (100.3 kg)  10/22/17 220 lb (99.8 kg)  08/06/17 221 lb 1.6 oz (100.3 kg)    Physical Exam  Constitutional: She appears well-developed and well-nourished. No distress.  HENT:  Head: Normocephalic and atraumatic.  Eyes: EOM are normal. No scleral icterus.  Neck: No thyromegaly present.  Cardiovascular: Normal rate, regular rhythm and normal heart sounds.  No murmur heard. Pulmonary/Chest: Effort normal and breath sounds normal. No respiratory  distress. She has no wheezes.  Abdominal: Soft. Bowel sounds are normal. She exhibits no distension.  Musculoskeletal: She exhibits no edema.  Feet:  Right Foot:  Protective Sensation: 5 sites tested. 5 sites sensed.  Skin Integrity: Positive for callus (medial RIGHT foot).  Left Foot:  Protective Sensation: 5 sites tested. 5 sites sensed.  Neurological: She is alert.  Skin: Skin is warm and dry. Rash (right anterior shoulder, erythematous plaque; erythema with whitish scale along hairline) noted. She is not diaphoretic. No pallor.  Psychiatric: She has a normal mood and affect. Her behavior is normal. Judgment and thought content normal.    Diabetic Foot Form - Detailed   Diabetic Foot Exam - detailed Diabetic Foot exam was performed with the following findings:  Yes 01/26/2018  7:58 AM  Visual Foot Exam completed.:  Yes  Pulse Foot Exam completed.:  Yes  Right Dorsalis Pedis:  Present Left Dorsalis Pedis:  Present  Sensory Foot Exam Completed.:  Yes Semmes-Weinstein Monofilament Test R Site 1-Great Toe:  Pos L Site 1-Great Toe:  Pos        Results for orders placed or performed in visit on 11/01/17  HM DIABETES EYE EXAM  Result Value Ref Range   HM Diabetic Eye Exam No Retinopathy No Retinopathy      Assessment & Plan:   Problem List Items Addressed This Visit      Cardiovascular and Mediastinum   Essential hypertension - Primary (Chronic)    Controlled on two agents; continue meds; she is eating better and working on weight loss        Respiratory   COPD (chronic obstructive pulmonary disease) (HCC) (Chronic)    Continue symbicort, oxygen via [place]      Chronic respiratory failure (HCC)    On supplemental oxygen 24/7        Endocrine   Type 2 diabetes mellitus without complication (HCC) (Chronic)    Foot exam by MD today; eye exams yearly; check A1c; so glad she is working on eating better and trying to lose weight      Relevant Medications   glimepiride  (AMARYL) 4 MG tablet   Other Relevant Orders   Hemoglobin A1c   Microalbumin / creatinine urine ratio     Musculoskeletal and Integument   Psoriasis    Seeing dermatologist; she knows which topicals to avoid using  on the fce        Other   Morbid obesity (HCC) (Chronic)    BMI > 35 with diabetes and high chol and HTN; working on weight      Relevant Medications   glimepiride (AMARYL) 4 MG tablet   Medication monitoring encounter    Check liver and kidneys      Relevant Orders   COMPLETE METABOLIC PANEL WITH GFR   Hyperlipidemia (Chronic)    Continue statin      Relevant Orders   Lipid panel   Elevated platelet count    Discussed likely inflammatory condition related to obesity; hope this will come down      Relevant Orders   CBC with Differential/Platelet    Other Visit Diagnoses    Need for influenza vaccination       Relevant Orders   Flu vaccine HIGH DOSE PF (Fluzone High dose) (Completed)       Follow up plan: Return in about 6 months (around 07/28/2018) for follow-up visit with Dr. Sherie Don.  An after-visit summary was printed and given to the patient at check-out.  Please see the patient instructions which may contain other information and recommendations beyond what is mentioned above in the assessment and plan.  Meds ordered this encounter  Medications  . glimepiride (AMARYL) 4 MG tablet    Sig: Take 0.5 tablets (2 mg total) by mouth daily before breakfast.    Dispense:  45 tablet    Refill:  3    Lowering the dose    Orders Placed This Encounter  Procedures  . Flu vaccine HIGH DOSE PF (Fluzone High dose)  . COMPLETE METABOLIC PANEL WITH GFR  . Hemoglobin A1c  . Lipid panel  . Microalbumin / creatinine urine ratio  . CBC with Differential/Platelet

## 2018-01-26 NOTE — Patient Instructions (Signed)
Check out the information at familydoctor.org entitled "Nutrition for Weight Loss: What You Need to Know about Fad Diets" Try to lose between 1-2 pounds per week by taking in fewer calories and burning off more calories You can succeed by limiting portions, limiting foods dense in calories and fat, becoming more active, and drinking 8 glasses of water a day (64 ounces) Don't skip meals, especially breakfast, as skipping meals may alter your metabolism Do not use over-the-counter weight loss pills or gimmicks that claim rapid weight loss A healthy BMI (or body mass index) is between 18.5 and 24.9 You can calculate your ideal BMI at the NIH website http://www.nhlbi.nih.gov/health/educational/lose_wt/BMI/bmicalc.htm Try to limit saturated fats in your diet (bologna, hot dogs, barbeque, cheeseburgers, hamburgers, steak, bacon, sausage, cheese, etc.) and get more fresh fruits, vegetables, and whole grains  

## 2018-01-26 NOTE — Assessment & Plan Note (Signed)
On supplemental oxygen 24/7

## 2018-01-26 NOTE — Assessment & Plan Note (Signed)
Discussed likely inflammatory condition related to obesity; hope this will come down

## 2018-01-26 NOTE — Assessment & Plan Note (Signed)
Check liver and kidneys 

## 2018-01-27 LAB — COMPLETE METABOLIC PANEL WITH GFR
AG RATIO: 1.2 (calc) (ref 1.0–2.5)
ALBUMIN MSPROF: 4.1 g/dL (ref 3.6–5.1)
ALT: 11 U/L (ref 6–29)
AST: 17 U/L (ref 10–35)
Alkaline phosphatase (APISO): 76 U/L (ref 33–130)
BUN / CREAT RATIO: 17 (calc) (ref 6–22)
BUN: 19 mg/dL (ref 7–25)
CHLORIDE: 99 mmol/L (ref 98–110)
CO2: 31 mmol/L (ref 20–32)
Calcium: 9.5 mg/dL (ref 8.6–10.4)
Creat: 1.12 mg/dL — ABNORMAL HIGH (ref 0.60–0.93)
GFR, EST AFRICAN AMERICAN: 56 mL/min/{1.73_m2} — AB (ref >=60)
GFR, EST NON AFRICAN AMERICAN: 49 mL/min/{1.73_m2} — AB (ref >=60)
Globulin: 3.3 g/dL (calc) (ref 1.9–3.7)
Glucose, Bld: 102 mg/dL — ABNORMAL HIGH (ref 65–99)
POTASSIUM: 5.3 mmol/L (ref 3.5–5.3)
Sodium: 137 mmol/L (ref 135–146)
TOTAL PROTEIN: 7.4 g/dL (ref 6.1–8.1)
Total Bilirubin: 0.3 mg/dL (ref 0.2–1.2)

## 2018-01-27 LAB — CBC WITH DIFFERENTIAL/PLATELET
BASOS PCT: 0.4 %
Basophils Absolute: 45 cells/uL (ref 0–200)
EOS PCT: 1.2 %
Eosinophils Absolute: 136 cells/uL (ref 15–500)
HCT: 38.5 % (ref 35.0–45.0)
Hemoglobin: 12.5 g/dL (ref 11.7–15.5)
LYMPHS ABS: 2859 {cells}/uL (ref 850–3900)
MCH: 28.2 pg (ref 27.0–33.0)
MCHC: 32.5 g/dL (ref 32.0–36.0)
MCV: 86.9 fL (ref 80.0–100.0)
MPV: 9.9 fL (ref 7.5–12.5)
Monocytes Relative: 7.5 %
Neutro Abs: 7413 cells/uL (ref 1500–7800)
Neutrophils Relative %: 65.6 %
Platelets: 441 10*3/uL — ABNORMAL HIGH (ref 140–400)
RBC: 4.43 10*6/uL (ref 3.80–5.10)
RDW: 13 % (ref 11.0–15.0)
TOTAL LYMPHOCYTE: 25.3 %
WBC: 11.3 10*3/uL — AB (ref 3.8–10.8)
WBCMIX: 848 {cells}/uL (ref 200–950)

## 2018-01-27 LAB — MICROALBUMIN / CREATININE URINE RATIO
Creatinine, Urine: 209 mg/dL (ref 20–275)
Microalb Creat Ratio: 7 mcg/mg creat (ref <30)
Microalb, Ur: 1.5 mg/dL

## 2018-01-27 LAB — HEMOGLOBIN A1C
EAG (MMOL/L): 8.5 (calc)
Hgb A1c MFr Bld: 7 % of total Hgb — ABNORMAL HIGH (ref <5.7)
Mean Plasma Glucose: 154 (calc)

## 2018-01-27 LAB — LIPID PANEL
Cholesterol: 155 mg/dL (ref <200)
HDL: 58 mg/dL (ref >50)
LDL CHOLESTEROL (CALC): 79 mg/dL
NON-HDL CHOLESTEROL (CALC): 97 mg/dL (ref <130)
Total CHOL/HDL Ratio: 2.7 (calc) (ref <5.0)
Triglycerides: 97 mg/dL (ref <150)

## 2018-02-06 DIAGNOSIS — E1121 Type 2 diabetes mellitus with diabetic nephropathy: Secondary | ICD-10-CM | POA: Diagnosis not present

## 2018-02-11 ENCOUNTER — Ambulatory Visit: Payer: Medicare Other | Admitting: Nurse Practitioner

## 2018-02-11 ENCOUNTER — Encounter: Payer: Self-pay | Admitting: Nurse Practitioner

## 2018-02-11 VITALS — BP 150/50 | HR 85 | Temp 98.1°F | Resp 16 | Ht 65.0 in | Wt 223.0 lb

## 2018-02-11 DIAGNOSIS — R05 Cough: Secondary | ICD-10-CM | POA: Diagnosis not present

## 2018-02-11 DIAGNOSIS — R0981 Nasal congestion: Secondary | ICD-10-CM

## 2018-02-11 DIAGNOSIS — J441 Chronic obstructive pulmonary disease with (acute) exacerbation: Secondary | ICD-10-CM | POA: Diagnosis not present

## 2018-02-11 DIAGNOSIS — R067 Sneezing: Secondary | ICD-10-CM

## 2018-02-11 DIAGNOSIS — R059 Cough, unspecified: Secondary | ICD-10-CM

## 2018-02-11 MED ORDER — GUAIFENESIN ER 600 MG PO TB12: 600.0000 mg | ORAL_TABLET | Freq: Two times a day (BID) | ORAL | 0 refills | Status: DC

## 2018-02-11 MED ORDER — LORATADINE 10 MG PO TABS: 10.0000 mg | ORAL_TABLET | Freq: Every day | ORAL | 0 refills | Status: DC

## 2018-02-11 MED ORDER — FLUTICASONE PROPIONATE 50 MCG/ACT NA SUSP: 2.0000 | Freq: Every day | NASAL | 6 refills | Status: DC

## 2018-02-11 MED ORDER — BENZONATATE 200 MG PO CAPS: 200.0000 mg | ORAL_CAPSULE | Freq: Two times a day (BID) | ORAL | 0 refills | Status: DC | PRN

## 2018-02-11 MED ORDER — PREDNISONE 10 MG (48) PO TBPK: ORAL_TABLET | ORAL | 0 refills | Status: DC

## 2018-02-11 NOTE — Progress Notes (Signed)
Name: Julie Keith   MRN: [id number]    DOB: Jul 08, 1945   Date:02/11/2018       Progress Note  Subjective  Chief Complaint  Chief Complaint  Patient presents with  . URI  . Sinusitis  . Cough  . Sore Throat    HPI  Patient endorses sore throat, cough, congestion, watery eyes, lots of sneezing started a few days ago. States feels like she has some chest congestion; states this is normally how her bronchitis and COPD exacerbations start. Requesting antibiotic, steroids and tessalon perls.    Patient Active Problem List   Diagnosis Date Noted  . Elevated platelet count 04/07/2017  . Hx of tobacco use, presenting hazards to health 04/08/2016  . Psoriasis 10/10/2015  . GERD (gastroesophageal reflux disease) 10/10/2015  . Medication monitoring encounter 10/10/2015  . Type 2 diabetes mellitus without complication (HCC) 10/31/2014  . Essential hypertension 10/31/2014  . Hyperlipidemia 10/31/2014  . Morbid obesity (HCC) 10/31/2014  . COPD (chronic obstructive pulmonary disease) (HCC) 06/28/2014  . Chronic respiratory failure (HCC) 06/28/2014    Past Medical History:  Diagnosis Date  . Allergic rhinitis   . Diabetes (HCC)   . Emphysema lung (HCC)   . GERD (gastroesophageal reflux disease) 10/10/2015  . Hx of tobacco use, presenting hazards to health 04/08/2016  . Hypercholesteremia   . Hypertension   . Psoriasis 10/10/2015    Past Surgical History:  Procedure Laterality Date  . ABDOMINAL HYSTERECTOMY    . battery in back: astum    . CERVICAL SPINE SURGERY    . CHOLECYSTECTOMY    . MASTECTOMY Bilateral     Social History   Tobacco Use  . Smoking status: Former Smoker    Packs/day: 2.00    Years: 30.00    Pack years: 60.00    Types: Cigarettes  . Smokeless tobacco: Never Used  . Tobacco comment: quit 2006  Substance Use Topics  . Alcohol use: No    Alcohol/week: 0.0 standard drinks     Current Outpatient Medications:  .  albuterol (PROVENTIL HFA;VENTOLIN  HFA) 108 (90 Base) MCG/ACT inhaler, Inhale 2 puffs into the lungs every 6 (six) hours as needed for wheezing or shortness of breath., Disp: , Rfl:  .  Apremilast 30 MG TABS, Take 30 mg by mouth 2 (two) times daily., Disp: , Rfl:  .  aspirin EC 81 MG tablet, Take 1 tablet (81 mg total) by mouth daily., Disp: 90 tablet, Rfl: 3 .  budesonide (PULMICORT) 0.5 MG/2ML nebulizer solution, Take 0.5 mg by nebulization as needed. , Disp: , Rfl:  .  enalapril (VASOTEC) 20 MG tablet, TAKE 1 TABLET BY MOUTH TWICE DAILY, Disp: 180 tablet, Rfl: 1 .  glimepiride (AMARYL) 4 MG tablet, Take 0.5 tablets (2 mg total) by mouth daily before breakfast., Disp: 45 tablet, Rfl: 3 .  glucose blood (ACCU-CHEK COMPACT PLUS) test strip, USE ONE STRIP TO CHECK GLUCOSE ONCE a day if needed, Disp: 100 each, Rfl: 3 .  hydrocortisone 2.5 % cream, APPLY 1 APPLICATION TOPICAL TWO TIMES A DAY, Disp: , Rfl: 1 .  hydrocortisone cream 1 %, APPLY 1 APPLICATION TOPICALLY TWICE DAILY, Disp: , Rfl: 1 .  metoprolol succinate (TOPROL-XL) 25 MG 24 hr tablet, Take 1 tablet (25 mg total) by mouth daily., Disp: 90 tablet, Rfl: 3 .  omeprazole (PRILOSEC) 20 MG capsule, TAKE 1 CAPSULE BY MOUTH ONCE DAILY, Disp: 90 capsule, Rfl: 1 .  pravastatin (PRAVACHOL) 80 MG tablet, Take 1 tablet (80 mg  total) by mouth daily., Disp: 90 tablet, Rfl: 1 .  triamcinolone ointment (KENALOG) 0.1 %, Apply topically as needed., Disp: , Rfl: 1 .  benzonatate (TESSALON) 200 MG capsule, Take 1 capsule (200 mg total) by mouth 2 (two) times daily as needed for cough., Disp: 30 capsule, Rfl: 0 .  fluticasone (FLONASE) 50 MCG/ACT nasal spray, Place 2 sprays into both nostrils daily., Disp: 16 g, Rfl: 6 .  guaiFENesin (MUCINEX) 600 MG 12 hr tablet, Take 1 tablet (600 mg total) by mouth 2 (two) times daily., Disp: 14 tablet, Rfl: 0 .  loratadine (CLARITIN) 10 MG tablet, Take 1 tablet (10 mg total) by mouth daily., Disp: 30 tablet, Rfl: 0 .  predniSONE (STERAPRED UNI-PAK 48 TAB) 10  MG (48) TBPK tablet, Take as directed, Disp: 48 tablet, Rfl: 0  No Known Allergies  Review of Systems  Constitutional: Negative for chills, fever and malaise/fatigue.  HENT: Positive for congestion, sinus pain and sore throat. Negative for ear discharge.   Eyes: Negative for blurred vision.  Respiratory: Positive for cough, sputum production and wheezing. Negative for hemoptysis and shortness of breath.   Cardiovascular: Negative for chest pain.  Gastrointestinal: Negative for abdominal pain and nausea.  Musculoskeletal: Negative for myalgias.  Skin: Negative for rash.    No other specific complaints in a complete review of systems (except as listed in HPI above).  Objective  Vitals:   02/11/18 1054  BP: (!) 150/50  Pulse: 85  Resp: 16  Temp: 98.1 F (36.7 C)  TempSrc: Oral  SpO2: 95%  Weight: 223 lb (101.2 kg)  Height: 5\' 5"  (1.651 m)     Body mass index is 37.11 kg/m.  Nursing Note and Vital Signs reviewed.  Physical Exam  Constitutional: She is oriented to person, place, and time. She appears well-developed and well-nourished. She is cooperative.  HENT:  Head: Normocephalic and atraumatic.  Right Ear: Hearing, external ear and ear canal normal. No tenderness. Tympanic membrane is not erythematous. A middle ear effusion is present.  Left Ear: Hearing, external ear and ear canal normal. No tenderness. Tympanic membrane is not erythematous. A middle ear effusion is present.  Nose: Mucosal edema and rhinorrhea present. Right sinus exhibits no maxillary sinus tenderness and no frontal sinus tenderness. Left sinus exhibits no maxillary sinus tenderness and no frontal sinus tenderness.  Mouth/Throat: Uvula is midline, oropharynx is clear and moist and mucous membranes are normal.  Eyes: Conjunctivae are normal.  Cardiovascular: Normal rate, regular rhythm and normal heart sounds.  Pulmonary/Chest: Effort normal. She has wheezes (mild) in the right upper field, the right  middle field, the right lower field, the left upper field, the left middle field and the left lower field.  Abdominal: Normal appearance.  Musculoskeletal: Normal range of motion.  Neurological: She is alert and oriented to person, place, and time.  Psychiatric: She has a normal mood and affect. Her speech is normal and behavior is normal. Judgment and thought content normal.       No results found for this or any previous visit (from the past 48 hour(s)).  Assessment & Plan  1. COPD exacerbation (HCC) Discussed delayed antibiotic prescribing, importance of taking medications, and using albuterol BID for the next few days in addition to PRN; patient declined neb treatment in office.  - predniSONE (STERAPRED UNI-PAK 48 TAB) 10 MG (48) TBPK tablet; Take as directed  Dispense: 48 tablet; Refill: 0  2. Cough - benzonatate (TESSALON) 200 MG capsule; Take 1 capsule (200  mg total) by mouth 2 (two) times daily as needed for cough.  Dispense: 30 capsule; Refill: 0 - guaiFENesin (MUCINEX) 600 MG 12 hr tablet; Take 1 tablet (600 mg total) by mouth 2 (two) times daily.  Dispense: 14 tablet; Refill: 0  3. Sneezing - loratadine (CLARITIN) 10 MG tablet; Take 1 tablet (10 mg total) by mouth daily.  Dispense: 30 tablet; Refill: 0  4. Nasal congestion - fluticasone (FLONASE) 50 MCG/ACT nasal spray; Place 2 sprays into both nostrils daily.  Dispense: 16 g; Refill: 6  -Red flags and when to present for emergency care or RTC including fever >101.79F, chest pain, shortness of breath, new/worsening/un-resolving symptoms,  reviewed with patient at time of visit. Follow up and care instructions discussed and provided in AVS.

## 2018-02-11 NOTE — Patient Instructions (Signed)
-   Rest, drink plenty of water and take vitamin C - Take guaifenesin(musinex) twice a day, take tessalon perls twice day  - Take Claritin daily  - Take Flonase daily  - Take albuterol inhaler at least twice a day for the next 3 days, can take more if needed  - Take prednisone if having worsening chest congestion - If having shortness of breath, fevers and chills, chest pain please seek immediate medical attention

## 2018-02-14 ENCOUNTER — Ambulatory Visit (INDEPENDENT_AMBULATORY_CARE_PROVIDER_SITE_OTHER): Payer: Medicare Other | Admitting: Nurse Practitioner

## 2018-02-14 ENCOUNTER — Encounter: Payer: Self-pay | Admitting: Nurse Practitioner

## 2018-02-14 VITALS — BP 130/50 | HR 72 | Temp 98.5°F | Resp 16 | Ht 65.0 in | Wt 215.5 lb

## 2018-02-14 DIAGNOSIS — R059 Cough, unspecified: Secondary | ICD-10-CM

## 2018-02-14 DIAGNOSIS — R0602 Shortness of breath: Secondary | ICD-10-CM | POA: Diagnosis not present

## 2018-02-14 DIAGNOSIS — R05 Cough: Secondary | ICD-10-CM | POA: Diagnosis not present

## 2018-02-14 DIAGNOSIS — F439 Reaction to severe stress, unspecified: Secondary | ICD-10-CM

## 2018-02-14 MED ORDER — AZITHROMYCIN 250 MG PO TABS: ORAL_TABLET | ORAL | 0 refills | Status: DC

## 2018-02-14 NOTE — Progress Notes (Addendum)
Name: Julie Keith   MRN: [id number]    DOB: [birth year]   Date:02/14/2018       Progress Note  Subjective  Chief Complaint  Chief Complaint  Patient presents with  . Cough    dry cough not getting any better.  Marland Kitchen COPD    HPI  Patient states feels like her cough has not improved with medication, taking prednisone.  Inisitent on getting a z-pack. States this is typical course for her to get bronchitis. Denies fevers, chills, chest pain. She feels musinex has made it worse.   Patient states her breathing overall has declined and requires oxygen therapy but would not like to see pulmonologist. Patient states she does not have money to pay for all her medications at time and is dealing with her husband with cancer and feels run down.  Patient Active Problem List   Diagnosis Date Noted  . Elevated platelet count 04/07/2017  . Hx of tobacco use, presenting hazards to health 04/08/2016  . Psoriasis 10/10/2015  . GERD (gastroesophageal reflux disease) 10/10/2015  . Medication monitoring encounter 10/10/2015  . Type 2 diabetes mellitus without complication (HCC) 10/31/2014  . Essential hypertension 10/31/2014  . Hyperlipidemia 10/31/2014  . Morbid obesity (HCC) 10/31/2014  . COPD (chronic obstructive pulmonary disease) (HCC) 06/28/2014  . Chronic respiratory failure (HCC) 06/28/2014    Past Medical History:  Diagnosis Date  . Allergic rhinitis   . Diabetes (HCC)   . Emphysema lung (HCC)   . GERD (gastroesophageal reflux disease) 10/10/2015  . Hx of tobacco use, presenting hazards to health 04/08/2016  . Hypercholesteremia   . Hypertension   . Psoriasis 10/10/2015    Past Surgical History:  Procedure Laterality Date  . ABDOMINAL HYSTERECTOMY    . battery in back: astum    . CERVICAL SPINE SURGERY    . CHOLECYSTECTOMY    . MASTECTOMY Bilateral     Social History   Tobacco Use  . Smoking status: Former Smoker    Packs/day: 2.00    Years: 30.00    Pack years: 60.00   Types: Cigarettes  . Smokeless tobacco: Never Used  . Tobacco comment: quit 2006  Substance Use Topics  . Alcohol use: No    Alcohol/week: 0.0 standard drinks     Current Outpatient Medications:  .  albuterol (PROVENTIL HFA;VENTOLIN HFA) 108 (90 Base) MCG/ACT inhaler, Inhale 2 puffs into the lungs every 6 (six) hours as needed for wheezing or shortness of breath., Disp: , Rfl:  .  Apremilast 30 MG TABS, Take 30 mg by mouth 2 (two) times daily., Disp: , Rfl:  .  aspirin EC 81 MG tablet, Take 1 tablet (81 mg total) by mouth daily., Disp: 90 tablet, Rfl: 3 .  azithromycin (ZITHROMAX) 250 MG tablet, 2 tablets today, and one 1 tablet for the following days., Disp: 6 tablet, Rfl: 0 .  benzonatate (TESSALON) 200 MG capsule, Take 1 capsule (200 mg total) by mouth 2 (two) times daily as needed for cough., Disp: 30 capsule, Rfl: 0 .  budesonide (PULMICORT) 0.5 MG/2ML nebulizer solution, Take 0.5 mg by nebulization as needed. , Disp: , Rfl:  .  enalapril (VASOTEC) 20 MG tablet, TAKE 1 TABLET BY MOUTH TWICE DAILY, Disp: 180 tablet, Rfl: 1 .  fluticasone (FLONASE) 50 MCG/ACT nasal spray, Place 2 sprays into both nostrils daily., Disp: 16 g, Rfl: 6 .  glimepiride (AMARYL) 4 MG tablet, Take 0.5 tablets (2 mg total) by mouth daily before breakfast., Disp: 45 tablet,  Rfl: 3 .  glucose blood (ACCU-CHEK COMPACT PLUS) test strip, USE ONE STRIP TO CHECK GLUCOSE ONCE a day if needed, Disp: 100 each, Rfl: 3 .  guaiFENesin (MUCINEX) 600 MG 12 hr tablet, Take 1 tablet (600 mg total) by mouth 2 (two) times daily., Disp: 14 tablet, Rfl: 0 .  hydrocortisone 2.5 % cream, APPLY 1 APPLICATION TOPICAL TWO TIMES A DAY, Disp: , Rfl: 1 .  hydrocortisone cream 1 %, APPLY 1 APPLICATION TOPICALLY TWICE DAILY, Disp: , Rfl: 1 .  loratadine (CLARITIN) 10 MG tablet, Take 1 tablet (10 mg total) by mouth daily., Disp: 30 tablet, Rfl: 0 .  metoprolol succinate (TOPROL-XL) 25 MG 24 hr tablet, Take 1 tablet (25 mg total) by mouth daily.,  Disp: 90 tablet, Rfl: 3 .  omeprazole (PRILOSEC) 20 MG capsule, TAKE 1 CAPSULE BY MOUTH ONCE DAILY, Disp: 90 capsule, Rfl: 1 .  pravastatin (PRAVACHOL) 80 MG tablet, Take 1 tablet (80 mg total) by mouth daily., Disp: 90 tablet, Rfl: 1 .  predniSONE (STERAPRED UNI-PAK 48 TAB) 10 MG (48) TBPK tablet, Take as directed, Disp: 48 tablet, Rfl: 0 .  triamcinolone ointment (KENALOG) 0.1 %, Apply topically as needed., Disp: , Rfl: 1  No Known Allergies  ROS    No other specific complaints in a complete review of systems (except as listed in HPI above).  Objective  Vitals:   02/14/18 0839  BP: (!) 130/50  Pulse: 72  Resp: 16  Temp: 98.5 F (36.9 C)  TempSrc: Oral  SpO2: 98%  Weight: 215 lb 8 oz (97.8 kg)  Height: 5\' 5"  (1.651 m)   Body mass index is 35.86 kg/m.  Nursing Note and Vital Signs reviewed.  Physical Exam  Constitutional: She is oriented to person, place, and time. She appears well-developed and well-nourished.  HENT:  Head: Normocephalic and atraumatic.  Cardiovascular: Normal rate, regular rhythm, normal heart sounds and intact distal pulses.  Pulmonary/Chest: Effort normal. She has wheezes (mild expiratory wheezes at the bases bilaterally).  Neurological: She is alert and oriented to person, place, and time. Coordination normal.  Skin: Skin is warm and dry. She is not diaphoretic. No erythema.  Psychiatric: She has a normal mood and affect. Her behavior is normal. Judgment and thought content normal.     No results found for this or any previous visit (from the past 48 hour(s)).  Assessment & Plan  1. Cough - azithromycin (ZITHROMAX) 250 MG tablet; 2 tablets today, and one 1 tablet for the following days.  Dispense: 6 tablet; Refill: 0  2. Shortness of breath Requested patient to get xray states she does not want but will get if not feeling better in a few days; ER precautions reviewed. Discussed course of URI, bronchitis and risk for pneumonia - azithromycin  (ZITHROMAX) 250 MG tablet; 2 tablets today, and one 1 tablet for the following days.  Dispense: 6 tablet; Refill: 0 - DG Chest 2 View; Future  3. Stress at home Discussed connected care referral; due to stating she cannot always pay for her care is overwhelmed; she declined at this time but open offer extended.

## 2018-02-14 NOTE — Patient Instructions (Addendum)
-   If unimproved in 2 days please gets chest xray; order is already placed to complete at imaging center across the street - If worsening symptoms or having chest pain, severe fatigue, shortness of breath, fevers, chills vomiting please go to emergency department - If you would later like [place] to have connected care team see how they can help with finding you community resources and see if they can help get your medications more affordable for you I am happy to make a referral. Please just let us know if you change your mind later.   Cough, Adult A cough helps to clear your throat and lungs. A cough may last only 2-3 weeks (acute), or it may last longer than 8 weeks (chronic). Many different things can cause a cough. A cough may be a sign of an illness or another medical condition. Follow these instructions at home:  Pay attention to any changes in your cough.  Take medicines only as told by your doctor. ? If you were prescribed an antibiotic medicine, take it as told by your doctor. Do not stop taking it even if you start to feel better. ? Talk with your doctor before you try using a cough medicine.  Drink enough fluid to keep your pee (urine) clear or pale yellow.  If the air is dry, use a cold steam vaporizer or humidifier in your home.  Stay away from things that make you cough at work or at home.  If your cough is worse at night, try using extra pillows to raise your head up higher while you sleep.  Do not smoke, and try not to be around smoke. If you need help quitting, ask your doctor.  Do not have caffeine.  Do not drink alcohol.  Rest as needed. Contact a doctor if:  You have new problems (symptoms).  You cough up yellow fluid (pus).  Your cough does not get better after 2-3 weeks, or your cough gets worse.  Medicine does not help your cough and you are not sleeping well.  You have pain that gets worse or pain that is not helped with medicine.  You have a fever.  You  are losing weight and you do not know why.  You have night sweats. Get help right away if:  You cough up blood.  You have trouble breathing.  Your heartbeat is very fast. This information is not intended to replace advice given to you by your health care provider. Make sure you discuss any questions you have with your health care provider. Document Released: 12/18/2010 Document Revised: 09/12/2015 Document Reviewed: 06/13/2014 Elsevier Interactive Patient Education  [employer].

## 2018-02-25 DIAGNOSIS — J449 Chronic obstructive pulmonary disease, unspecified: Secondary | ICD-10-CM | POA: Diagnosis not present

## 2018-03-07 ENCOUNTER — Encounter: Payer: Self-pay | Admitting: Family Medicine

## 2018-03-07 MED ORDER — PANTOPRAZOLE SODIUM 20 MG PO TBEC: 20.0000 mg | DELAYED_RELEASE_TABLET | Freq: Every day | ORAL | 0 refills | Status: DC

## 2018-03-27 DIAGNOSIS — J449 Chronic obstructive pulmonary disease, unspecified: Secondary | ICD-10-CM | POA: Diagnosis not present

## 2018-03-28 ENCOUNTER — Other Ambulatory Visit: Payer: Self-pay

## 2018-03-28 DIAGNOSIS — E119 Type 2 diabetes mellitus without complications: Secondary | ICD-10-CM

## 2018-03-28 MED ORDER — ACCU-CHEK GUIDE ME W/DEVICE KIT: 1.0000 | PACK | Freq: Every day | 0 refills | Status: DC

## 2018-03-28 NOTE — Telephone Encounter (Signed)
Lab Results  Component Value Date   HGBA1C 7.0 (H) 01/26/2018   On sulfonylurea, so checking FSBS is appropriate

## 2018-03-29 ENCOUNTER — Other Ambulatory Visit: Payer: Self-pay

## 2018-03-29 MED ORDER — ACCU-CHEK MULTICLIX LANCETS MISC: 12 refills | Status: DC

## 2018-03-29 MED ORDER — GLUCOSE BLOOD VI STRP: ORAL_STRIP | 3 refills | Status: DC

## 2018-03-30 ENCOUNTER — Other Ambulatory Visit: Payer: Self-pay

## 2018-03-30 MED ORDER — GLUCOSE BLOOD VI STRP: ORAL_STRIP | 12 refills | Status: DC

## 2018-03-31 DIAGNOSIS — E1121 Type 2 diabetes mellitus with diabetic nephropathy: Secondary | ICD-10-CM | POA: Diagnosis not present

## 2018-04-27 DIAGNOSIS — J449 Chronic obstructive pulmonary disease, unspecified: Secondary | ICD-10-CM | POA: Diagnosis not present

## 2018-05-02 DIAGNOSIS — E1121 Type 2 diabetes mellitus with diabetic nephropathy: Secondary | ICD-10-CM | POA: Diagnosis not present

## 2018-05-12 ENCOUNTER — Other Ambulatory Visit: Payer: Self-pay | Admitting: Family Medicine

## 2018-05-12 NOTE — Telephone Encounter (Signed)
Pt states it is a whole 4mg  pill

## 2018-05-12 NOTE — Telephone Encounter (Signed)
Please verify with patient My last Rx for glimepiride was for half of a tablet (just 2 mg total) The request sent to me was for a whole pill Please reconcile and send back to me

## 2018-05-12 NOTE — Telephone Encounter (Signed)
Thank you. Rx sent.

## 2018-05-18 ENCOUNTER — Telehealth: Payer: Self-pay | Admitting: Family Medicine

## 2018-05-18 NOTE — Telephone Encounter (Signed)
That's fine

## 2018-05-18 NOTE — Telephone Encounter (Signed)
Copied from CRM [id number]. Topic: Quick Communication - Home Health Verbal Orders >> May 18, 2018 10:04 AM Crist Infante wrote: Verlon Au with [facility] is seeking to get pt's oxygen re certified. Requesting any office visit notes within the past 6 months faxed to her Fax  [phone]   CB [phone]

## 2018-05-18 NOTE — Telephone Encounter (Signed)
Spoke with Verlon Au at [facility] and gave verbal okay per Dr. Sherie Don for oxygen re certification. Office visit notes faxed.

## 2018-06-17 DIAGNOSIS — J449 Chronic obstructive pulmonary disease, unspecified: Secondary | ICD-10-CM | POA: Diagnosis not present

## 2018-06-18 ENCOUNTER — Other Ambulatory Visit: Payer: Self-pay | Admitting: Family Medicine

## 2018-06-20 ENCOUNTER — Telehealth: Payer: Self-pay | Admitting: Family Medicine

## 2018-06-20 NOTE — Telephone Encounter (Signed)
I left a message asking both the patient and her husband to call and schedule AWV-I with [facility]. VDM (DD)

## 2018-07-01 DIAGNOSIS — E1121 Type 2 diabetes mellitus with diabetic nephropathy: Secondary | ICD-10-CM | POA: Diagnosis not present

## 2018-07-16 DIAGNOSIS — J449 Chronic obstructive pulmonary disease, unspecified: Secondary | ICD-10-CM | POA: Diagnosis not present

## 2018-07-18 ENCOUNTER — Other Ambulatory Visit: Payer: Self-pay | Admitting: Family Medicine

## 2018-07-19 ENCOUNTER — Other Ambulatory Visit: Payer: Self-pay | Admitting: Family Medicine

## 2018-07-19 NOTE — Telephone Encounter (Signed)
Lab Results  Component Value Date   CREATININE 1.12 (H) 01/26/2018   Lab Results  Component Value Date   K 5.3 01/26/2018   Due to the COVID-19 pandemic, I will not make patient come in for labs in the next few weeks Refill provided

## 2018-07-28 ENCOUNTER — Ambulatory Visit: Payer: Medicare Other | Admitting: Family Medicine

## 2018-08-04 ENCOUNTER — Other Ambulatory Visit: Payer: Self-pay | Admitting: Family Medicine

## 2018-08-05 NOTE — Telephone Encounter (Signed)
Lab Results  Component Value Date   CREATININE 1.12 (H) 01/26/2018   Lab Results  Component Value Date   K 5.3 01/26/2018

## 2018-08-16 DIAGNOSIS — J449 Chronic obstructive pulmonary disease, unspecified: Secondary | ICD-10-CM | POA: Diagnosis not present

## 2018-09-15 DIAGNOSIS — J449 Chronic obstructive pulmonary disease, unspecified: Secondary | ICD-10-CM | POA: Diagnosis not present

## 2018-09-22 ENCOUNTER — Ambulatory Visit: Payer: Medicare Other | Admitting: Family Medicine

## 2018-09-23 ENCOUNTER — Encounter: Payer: Self-pay | Admitting: Nurse Practitioner

## 2018-09-23 ENCOUNTER — Ambulatory Visit (INDEPENDENT_AMBULATORY_CARE_PROVIDER_SITE_OTHER): Payer: Medicare Other | Admitting: Nurse Practitioner

## 2018-09-23 VITALS — BP 126/70 | Ht 65.0 in | Wt 217.0 lb

## 2018-09-23 DIAGNOSIS — J431 Panlobular emphysema: Secondary | ICD-10-CM

## 2018-09-23 DIAGNOSIS — E782 Mixed hyperlipidemia: Secondary | ICD-10-CM

## 2018-09-23 DIAGNOSIS — E119 Type 2 diabetes mellitus without complications: Secondary | ICD-10-CM | POA: Diagnosis not present

## 2018-09-23 DIAGNOSIS — I10 Essential (primary) hypertension: Secondary | ICD-10-CM | POA: Diagnosis not present

## 2018-09-23 DIAGNOSIS — K219 Gastro-esophageal reflux disease without esophagitis: Secondary | ICD-10-CM | POA: Diagnosis not present

## 2018-09-23 MED ORDER — GLUCOSE BLOOD VI STRP: ORAL_STRIP | 3 refills | Status: DC

## 2018-09-23 MED ORDER — ENALAPRIL MALEATE 20 MG PO TABS: 20.0000 mg | ORAL_TABLET | Freq: Two times a day (BID) | ORAL | 1 refills | Status: DC

## 2018-09-23 MED ORDER — PANTOPRAZOLE SODIUM 20 MG PO TBEC: 20.0000 mg | DELAYED_RELEASE_TABLET | Freq: Every day | ORAL | 1 refills | Status: DC

## 2018-09-23 MED ORDER — GLIMEPIRIDE 4 MG PO TABS: 4.0000 mg | ORAL_TABLET | Freq: Every day | ORAL | 1 refills | Status: DC

## 2018-09-23 MED ORDER — PRAVASTATIN SODIUM 80 MG PO TABS: 80.0000 mg | ORAL_TABLET | Freq: Every day | ORAL | 1 refills | Status: DC

## 2018-09-23 NOTE — Progress Notes (Signed)
Virtual Visit via Telephone Note  I connected with Julie Keith on 09/23/18 at  8:20 AM EDT by telephone and verified that I am speaking with the correct person using two identifiers.   Staff discussed the limitations, risks, security and privacy concerns of performing an evaluation and management service by telephone and the availability of in person appointments. Staff also discussed with the patient that there may be a patient responsible charge related to this service. The patient expressed understanding and agreed to proceed.  Patients location: home My location: work office  Other people in meeting: none    HPI  Hypertension metoprolol succinate '25mg'$  daily, enalapril '20mg'$  BID takes it as prescribed. Sees Dr. Fletcher Anon- cardiologist.  Denies headaches, dizziness, chest pain, blurry vision BP Readings from Last 3 Encounters:  09/23/18 126/70  02/14/18 (!) 130/50  02/11/18 (!) 150/50   Panlobular emphysema Takes pulmicort PRN, last exacerbation was fall of last year with URI Patient denies chronic cough, shortness of breath   Gastroesophageal reflux disease Take protonix daily, states triggers are fried foods.   Type 2 diabetes mellitus  Takes amaryl '4mg'$  daily Fasting blood sugars between 66-116; states typically is in the 100 range but states has hypoglycemia maybe once a month- states she would like to stay on this medications.  Lab Results  Component Value Date   HGBA1C 7.0 (H) 01/26/2018    Hyperlipidemia Pravastatin '80mg'$  daily  Lab Results  Component Value Date   CHOL 155 01/26/2018   HDL 58 01/26/2018   LDLCALC 79 01/26/2018   TRIG 97 01/26/2018   CHOLHDL 2.7 01/26/2018    Morbid obesity  States has been loosing weight by eating healthier.    PHQ2/9: Depression screen [facility] 2/9 09/23/2018 01/26/2018 08/06/2017 04/07/2017 12/02/2016  Decreased Interest 0 0 0 0 0  Down, Depressed, Hopeless 0 0 0 0 0  PHQ - 2 Score 0 0 0 0 0  Altered sleeping 0 0 - - -  Tired,  decreased energy 0 0 - - -  Change in appetite 0 0 - - -  Feeling bad or failure about yourself  0 0 - - -  Trouble concentrating 0 0 - - -  Moving slowly or fidgety/restless 0 0 - - -  Suicidal thoughts 0 0 - - -  PHQ-9 Score 0 0 - - -  Difficult doing work/chores Not difficult at all Not difficult at all - - -    PHQ reviewed. Negative  Patient Active Problem List   Diagnosis Date Noted  . Elevated platelet count 04/07/2017  . Hx of tobacco use, presenting hazards to health 04/08/2016  . Psoriasis 10/10/2015  . GERD (gastroesophageal reflux disease) 10/10/2015  . Medication monitoring encounter 10/10/2015  . Type 2 diabetes mellitus without complication ([place]) 25/42/7062  . Essential hypertension 10/31/2014  . Hyperlipidemia 10/31/2014  . Morbid obesity ([place]) 10/31/2014  . COPD (chronic obstructive pulmonary disease) ([place]) 06/28/2014  . Chronic respiratory failure ([place]) 06/28/2014    Past Medical History:  Diagnosis Date  . Allergic rhinitis   . Diabetes ([place])   . Emphysema lung ([place])   . GERD (gastroesophageal reflux disease) 10/10/2015  . Hx of tobacco use, presenting hazards to health 04/08/2016  . Hypercholesteremia   . Hypertension   . Psoriasis 10/10/2015    Past Surgical History:  Procedure Laterality Date  . ABDOMINAL HYSTERECTOMY    . battery in back: astum    . CERVICAL SPINE SURGERY    . CHOLECYSTECTOMY    .  MASTECTOMY Bilateral     Social History   Tobacco Use  . Smoking status: Former Smoker    Packs/day: 2.00    Years: 30.00    Pack years: 60.00    Types: Cigarettes  . Smokeless tobacco: Never Used  . Tobacco comment: quit 2006  Substance Use Topics  . Alcohol use: No    Alcohol/week: 0.0 standard drinks     Current Outpatient Medications:  .  albuterol (PROVENTIL HFA;VENTOLIN HFA) 108 (90 Base) MCG/ACT inhaler, Inhale 2 puffs into the lungs every 6 (six) hours as needed for wheezing or shortness of breath., Disp: , Rfl:  .  Apremilast 30  MG TABS, Take 30 mg by mouth 2 (two) times daily., Disp: , Rfl:  .  aspirin EC 81 MG tablet, Take 1 tablet (81 mg total) by mouth daily., Disp: 90 tablet, Rfl: 3 .  budesonide (PULMICORT) 0.5 MG/2ML nebulizer solution, Take 0.5 mg by nebulization as needed. , Disp: , Rfl:  .  enalapril (VASOTEC) 20 MG tablet, Take 1 tablet by mouth twice daily, Disp: 180 tablet, Rfl: 0 .  glimepiride (AMARYL) 4 MG tablet, Take 1 tablet (4 mg total) by mouth daily with breakfast., Disp: 90 tablet, Rfl: 1 .  hydrocortisone 2.5 % cream, as needed. , Disp: , Rfl: 1 .  metoprolol succinate (TOPROL-XL) 25 MG 24 hr tablet, Take 1 tablet (25 mg total) by mouth daily., Disp: 90 tablet, Rfl: 3 .  pantoprazole (PROTONIX) 20 MG tablet, TAKE 1 TABLET BY MOUTH ONCE DAILY FOR ACID REFLUX/HEARTBURN. REPLACES OMEPRAZOLE., Disp: 90 tablet, Rfl: 0 .  pravastatin (PRAVACHOL) 80 MG tablet, Take 1 tablet by mouth once daily, Disp: 90 tablet, Rfl: 0 .  triamcinolone ointment (KENALOG) 0.1 %, Apply topically as needed., Disp: , Rfl: 1 .  benzonatate (TESSALON) 200 MG capsule, Take 1 capsule (200 mg total) by mouth 2 (two) times daily as needed for cough. (Patient not taking: Reported on 09/23/2018), Disp: 30 capsule, Rfl: 0 .  Blood Glucose Monitoring Suppl (ACCU-CHEK GUIDE ME) w/Device KIT, Inject 1 Device into the skin daily. Dx:E11.9 LON:99, Disp: 1 kit, Rfl: 0 .  fluticasone (FLONASE) 50 MCG/ACT nasal spray, Place 2 sprays into both nostrils daily. (Patient not taking: Reported on 09/23/2018), Disp: 16 g, Rfl: 6 .  glucose blood (ACCU-CHEK COMPACT PLUS) test strip, USE ONE STRIP TO CHECK GLUCOSE ONCE a day if needed, Disp: 100 each, Rfl: 3 .  glucose blood (ACCU-CHEK GUIDE) test strip, Check blood sugars once daily DX:E11.9  LON:99, Disp: 100 each, Rfl: 12 .  guaiFENesin (MUCINEX) 600 MG 12 hr tablet, Take 1 tablet (600 mg total) by mouth 2 (two) times daily. (Patient not taking: Reported on 09/23/2018), Disp: 14 tablet, Rfl: 0 .   hydrocortisone cream 1 %, APPLY 1 APPLICATION TOPICALLY TWICE DAILY, Disp: , Rfl: 1 .  Lancets (ACCU-CHEK MULTICLIX) lancets, DX:E11.21, LON:99, Disp: 100 each, Rfl: 12 .  loratadine (CLARITIN) 10 MG tablet, Take 1 tablet (10 mg total) by mouth daily. (Patient not taking: Reported on 09/23/2018), Disp: 30 tablet, Rfl: 0 .  predniSONE (STERAPRED UNI-PAK 48 TAB) 10 MG (48) TBPK tablet, Take as directed (Patient not taking: Reported on 09/23/2018), Disp: 48 tablet, Rfl: 0  No Known Allergies  Review of Systems  Constitutional: Negative for chills, fever and malaise/fatigue.  Respiratory: Negative for cough and shortness of breath.   Cardiovascular: Negative for chest pain, palpitations and leg swelling.  Gastrointestinal: Negative for abdominal pain.  Musculoskeletal: Negative for joint pain  and myalgias.  Skin: Negative for rash.  Neurological: Negative for dizziness and headaches.  Psychiatric/Behavioral: The patient is not nervous/anxious and does not have insomnia.       No other specific complaints in a complete review of systems (except as listed in HPI above).  Objective  Vitals:   09/23/18 0811  BP: 126/70  SpO2: 95%  Weight: 217 lb (98.4 kg)  Height: '5\' 5"'$  (1.651 m)     Body mass index is 36.11 kg/m.  Patient is alert, able to speak in full sentences without difficulty.   Assessment & Plan  1. Essential hypertension Stable continue meds - enalapril (VASOTEC) 20 MG tablet; Take 1 tablet (20 mg total) by mouth 2 (two) times daily.  Dispense: 180 tablet; Refill: 1  2. Panlobular emphysema (HCC) Stable   3. Gastroesophageal reflux disease, esophagitis presence not specified Discussed diet - pantoprazole (PROTONIX) 20 MG tablet; Take 1 tablet (20 mg total) by mouth daily.  Dispense: 90 tablet; Refill: 1  4. Type 2 diabetes mellitus without complication, without long-term current use of insulin ([place]) Requesting patient to come off medication due to hypoglycemia and  increased risk with aging patient states she would like to stay on this medication as it has been working for her and her episodes are limited and she controls them- discussed goal to switch medicines if becoming more frequent and would prefer not to wait.  - glimepiride (AMARYL) 4 MG tablet; Take 1 tablet (4 mg total) by mouth daily with breakfast.  Dispense: 90 tablet; Refill: 1 - glucose blood (ACCU-CHEK COMPACT PLUS) test strip; USE ONE STRIP TO CHECK GLUCOSE ONCE a day if needed  Dispense: 100 each; Refill: 3  5. Mixed hyperlipidemia stable - pravastatin (PRAVACHOL) 80 MG tablet; Take 1 tablet (80 mg total) by mouth daily.  Dispense: 90 tablet; Refill: 1  6. Morbid obesity ([place]) BMI greater than 35 + diabetes, GERD, hypertension     I discussed the assessment and treatment plan with the patient. The patient was provided an opportunity to ask questions and all were answered. The patient agreed with the plan and demonstrated an understanding of the instructions.   The patient was advised to call back or seek an in-person evaluation if the symptoms worsen or if the condition fails to improve as anticipated.  I provided 22 minutes of non-face-to-face time during this encounter.   Fredderick Severance, NP

## 2018-10-16 DIAGNOSIS — J449 Chronic obstructive pulmonary disease, unspecified: Secondary | ICD-10-CM | POA: Diagnosis not present

## 2018-11-15 DIAGNOSIS — J449 Chronic obstructive pulmonary disease, unspecified: Secondary | ICD-10-CM | POA: Diagnosis not present

## 2018-12-16 DIAGNOSIS — J449 Chronic obstructive pulmonary disease, unspecified: Secondary | ICD-10-CM | POA: Diagnosis not present

## 2018-12-27 ENCOUNTER — Ambulatory Visit (INDEPENDENT_AMBULATORY_CARE_PROVIDER_SITE_OTHER): Payer: Medicare Other | Admitting: Family Medicine

## 2018-12-27 ENCOUNTER — Other Ambulatory Visit: Payer: Self-pay

## 2018-12-27 ENCOUNTER — Encounter: Payer: Self-pay | Admitting: Family Medicine

## 2018-12-27 VITALS — BP 136/74 | HR 85 | Temp 97.1°F | Resp 14 | Ht 65.0 in | Wt 215.0 lb

## 2018-12-27 DIAGNOSIS — E782 Mixed hyperlipidemia: Secondary | ICD-10-CM

## 2018-12-27 DIAGNOSIS — Z23 Encounter for immunization: Secondary | ICD-10-CM | POA: Diagnosis not present

## 2018-12-27 DIAGNOSIS — R002 Palpitations: Secondary | ICD-10-CM

## 2018-12-27 DIAGNOSIS — I10 Essential (primary) hypertension: Secondary | ICD-10-CM

## 2018-12-27 DIAGNOSIS — Z5181 Encounter for therapeutic drug level monitoring: Secondary | ICD-10-CM

## 2018-12-27 DIAGNOSIS — E1121 Type 2 diabetes mellitus with diabetic nephropathy: Secondary | ICD-10-CM | POA: Diagnosis not present

## 2018-12-27 DIAGNOSIS — K219 Gastro-esophageal reflux disease without esophagitis: Secondary | ICD-10-CM

## 2018-12-27 DIAGNOSIS — E119 Type 2 diabetes mellitus without complications: Secondary | ICD-10-CM | POA: Diagnosis not present

## 2018-12-27 MED ORDER — ACCU-CHEK GUIDE VI STRP: ORAL_STRIP | 12 refills | Status: DC

## 2018-12-27 MED ORDER — PANTOPRAZOLE SODIUM 20 MG PO TBEC: 20.0000 mg | DELAYED_RELEASE_TABLET | Freq: Every day | ORAL | 1 refills | Status: DC

## 2018-12-27 MED ORDER — METOPROLOL SUCCINATE ER 25 MG PO TB24: 25.0000 mg | ORAL_TABLET | Freq: Every day | ORAL | 3 refills | Status: DC

## 2018-12-27 MED ORDER — ENALAPRIL MALEATE 20 MG PO TABS: 20.0000 mg | ORAL_TABLET | Freq: Two times a day (BID) | ORAL | 1 refills | Status: DC

## 2018-12-27 MED ORDER — PRAVASTATIN SODIUM 80 MG PO TABS: 80.0000 mg | ORAL_TABLET | Freq: Every day | ORAL | 1 refills | Status: DC

## 2018-12-27 NOTE — Assessment & Plan Note (Signed)
Good sensation to feet today, foot exam done Discussed Amaryl medication and its risk for her age of hypoglycemia I have encouraged her to check her blood sugars daily and instructed her to hold Amaryl if she has any low blood sugars or she is skipping meals or ill -I have advised her that I would prefer to get her off of Amaryl and onto a different medication Labs done today

## 2018-12-27 NOTE — Assessment & Plan Note (Signed)
BP at goal/well controlled Continue meds

## 2018-12-27 NOTE — Assessment & Plan Note (Signed)
Well controlled, continue protonix daily

## 2018-12-27 NOTE — Progress Notes (Addendum)
Name: Julie Keith   MRN: [id number]    DOB: 1945/11/16   Date:12/27/2018       Progress Note  Chief Complaint  Patient presents with  . Follow-up  . Hypertension  . Gastroesophageal Reflux  . Hyperlipidemia  . Diabetes    BG 99     Subjective:   Julie Keith is a 73 y.o. female, presents to clinic for routine follow up on the conditions listed above.  Hypertension Watching BP at home, pt states its running "real good"   Metoprolol succinate '25mg'$  daily, enalapril '20mg'$  BID takes it as prescribed.  Denies headaches, dizziness, chest pain, blurry vision, fatigued, near syncope She has hx of palpitations, but metoprolol has helped and sx are much better, she denies orthopnea, LE edema,  BP Readings from Last 3 Encounters:  12/27/18 136/74  09/23/18 126/70  02/14/18 (!) 130/50   Sees Dr. Fletcher Anon- cardiologist. - was supposed to f/up in one year, but isn't sure when appt is.   She would also like to have all medications under 1 provider.    Panlobular emphysema Takes pulmicort PRN?  She is not really sure about her inhalers and later in the visit pulled out a inhaler out of her bag which is albuterol inhaler, has budesonide vials to use to neb machine Patient denies chronic cough, shortness of breath  Is on O2 via [place] 2L  No changes to her baseline.   Last fall was last time she had COPD exacerbation - - when sick she uses pulmicort daily?  (possibly albuterol PRN?)   Type 2 diabetes mellitus  Blood sugar this AM was 99 Takes amaryl '4mg'$  daily -she takes this daily regardless of her blood sugars and is not on any other medication.   She states that metformin causes her to have headaches. Fasting blood sugars are usually around 100 she denies any hypoglycemia episodes.  She is checking her blood sugar very infrequently right now.  Does not want to change medications.  Her husband is on insulin and most of the medications have been very expensive for them to obtain. Lab Results   Component Value Date   HGBA1C 7.0 (H) 01/26/2018     Hyperlipidemia Ate breakfast - but hasn't had labs in almost a year - will do labs anyhow Pravastatin '80mg'$  daily - taking daily, no SE Working on diet, avoids salt and high fat food, fried foods, she is trying to eat whole grains (wheat bread) Lab Results  Component Value Date   CHOL 155 01/26/2018   HDL 58 01/26/2018   LDLCALC 79 01/26/2018   TRIG 97 01/26/2018   CHOLHDL 2.7 01/26/2018    Morbid obesity  States has been loosing weight by eating healthier. she has lost a few lbs Wt Readings from Last 5 Encounters:  12/27/18 215 lb (97.5 kg)  09/23/18 217 lb (98.4 kg)  02/14/18 215 lb 8 oz (97.8 kg)  02/11/18 223 lb (101.2 kg)  01/26/18 221 lb 3.2 oz (100.3 kg)   BMI Readings from Last 5 Encounters:  12/27/18 35.78 kg/m  09/23/18 36.11 kg/m  02/14/18 35.86 kg/m  02/11/18 37.11 kg/m  01/26/18 36.81 kg/m    She is willing to get flu shot done today.  Patient Active Problem List   Diagnosis Date Noted  . Psoriasis 10/10/2015  . GERD (gastroesophageal reflux disease) 10/10/2015  . Type 2 diabetes mellitus with diabetic nephropathy, without long-term current use of insulin ([place]) 10/31/2014  . Essential hypertension 10/31/2014  .  Hyperlipidemia 10/31/2014  . Morbid obesity ([place]) 10/31/2014  . COPD (chronic obstructive pulmonary disease) ([place]) 06/28/2014    Past Surgical History:  Procedure Laterality Date  . ABDOMINAL HYSTERECTOMY    . battery in back: astum    . CERVICAL SPINE SURGERY    . CHOLECYSTECTOMY    . MASTECTOMY Bilateral     Family History  Problem Relation Age of Onset  . Leukemia Mother   . Pneumonia Father   . COPD Father   . Diabetes Brother   . Cancer Brother        lung cancer  . Diabetes Sister     Social History   Socioeconomic History  . Marital status: Married    Spouse name: Not on file  . Number of children: Not on file  . Years of education: Not on file  . Highest  education level: Not on file  Occupational History  . Not on file  Social Needs  . Financial resource strain: Not on file  . Food insecurity    Worry: Not on file    Inability: Not on file  . Transportation needs    Medical: Not on file    Non-medical: Not on file  Tobacco Use  . Smoking status: Former Smoker    Packs/day: 2.00    Years: 30.00    Pack years: 60.00    Types: Cigarettes  . Smokeless tobacco: Never Used  . Tobacco comment: quit 2006  Substance and Sexual Activity  . Alcohol use: No    Alcohol/week: 0.0 standard drinks  . Drug use: No  . Sexual activity: Not Currently  Lifestyle  . Physical activity    Days per week: Not on file    Minutes per session: Not on file  . Stress: Not on file  Relationships  . Social Herbalist on phone: Not on file    Gets together: Not on file    Attends religious service: Not on file    Active member of club or organization: Not on file    Attends meetings of clubs or organizations: Not on file    Relationship status: Not on file  . Intimate partner violence    Fear of current or ex partner: Not on file    Emotionally abused: Not on file    Physically abused: Not on file    Forced sexual activity: Not on file  Other Topics Concern  . Not on file  Social History Narrative  . Not on file     Current Outpatient Medications:  .  albuterol (PROVENTIL HFA;VENTOLIN HFA) 108 (90 Base) MCG/ACT inhaler, Inhale 2 puffs into the lungs every 6 (six) hours as needed for wheezing or shortness of breath., Disp: , Rfl:  .  Apremilast 30 MG TABS, Take 30 mg by mouth 2 (two) times daily., Disp: , Rfl:  .  aspirin EC 81 MG tablet, Take 1 tablet (81 mg total) by mouth daily., Disp: 90 tablet, Rfl: 3 .  budesonide (PULMICORT) 0.5 MG/2ML nebulizer solution, Take 0.5 mg by nebulization as needed. , Disp: , Rfl:  .  enalapril (VASOTEC) 20 MG tablet, Take 1 tablet (20 mg total) by mouth 2 (two) times daily., Disp: 180 tablet, Rfl: 1 .   glimepiride (AMARYL) 4 MG tablet, Take 1 tablet (4 mg total) by mouth daily with breakfast., Disp: 90 tablet, Rfl: 1 .  hydrocortisone 2.5 % cream, as needed. , Disp: , Rfl: 1 .  metoprolol succinate (TOPROL-XL)  25 MG 24 hr tablet, Take 1 tablet (25 mg total) by mouth daily., Disp: 90 tablet, Rfl: 3 .  pantoprazole (PROTONIX) 20 MG tablet, Take 1 tablet (20 mg total) by mouth daily., Disp: 90 tablet, Rfl: 1 .  pravastatin (PRAVACHOL) 80 MG tablet, Take 1 tablet (80 mg total) by mouth daily., Disp: 90 tablet, Rfl: 1 .  triamcinolone ointment (KENALOG) 0.1 %, Apply topically as needed., Disp: , Rfl: 1 .  Blood Glucose Monitoring Suppl (ACCU-CHEK GUIDE ME) w/Device KIT, Inject 1 Device into the skin daily. Dx:E11.9 LON:99, Disp: 1 kit, Rfl: 0 .  glucose blood (ACCU-CHEK COMPACT PLUS) test strip, USE ONE STRIP TO CHECK GLUCOSE ONCE a day if needed, Disp: 100 each, Rfl: 3 .  glucose blood (ACCU-CHEK GUIDE) test strip, Check blood sugars once daily DX:E11.9  LON:99, Disp: 100 each, Rfl: 12 .  hydrocortisone cream 1 %, APPLY 1 APPLICATION TOPICALLY TWICE DAILY, Disp: , Rfl: 1 .  Lancets (ACCU-CHEK MULTICLIX) lancets, DX:E11.21, LON:99, Disp: 100 each, Rfl: 12  No Known Allergies  I personally reviewed active problem list, medication list, allergies, family history, social history, health maintenance, notes from last encounter, lab results with the patient/caregiver today.  Review of Systems  Constitutional: Negative.   HENT: Negative.   Eyes: Negative.   Respiratory: Negative.   Cardiovascular: Negative.   Gastrointestinal: Negative.   Endocrine: Negative.   Genitourinary: Negative.   Musculoskeletal: Negative.   Skin: Negative.   Allergic/Immunologic: Negative.   Neurological: Negative.   Hematological: Negative.   Psychiatric/Behavioral: Negative.   All other systems reviewed and are negative.    Objective:    Vitals:   12/27/18 0814  BP: 136/74  Pulse: 85  Resp: 14  Temp: (!)  97.1 F (36.2 C)  SpO2: 98%  Weight: 215 lb (97.5 kg)  Height: '5\' 5"'$  (1.651 m)    Body mass index is 35.78 kg/m.  Physical Exam Vitals signs and nursing note reviewed.  Constitutional:      General: She is not in acute distress.    Appearance: Normal appearance. She is well-developed. She is obese. She is not ill-appearing, toxic-appearing or diaphoretic.     Comments: On [place] oxygen and wearing a mask  HENT:     Head: Normocephalic and atraumatic.     Right Ear: External ear normal.     Left Ear: External ear normal.     Nose: Nose normal.     Mouth/Throat:     Mouth: Mucous membranes are moist.     Pharynx: Oropharynx is clear. Uvula midline.  Eyes:     General: Lids are normal.     Conjunctiva/sclera: Conjunctivae normal.     Pupils: Pupils are equal, round, and reactive to light.  Neck:     Musculoskeletal: Normal range of motion and neck supple.     Trachea: Phonation normal. No tracheal deviation.  Cardiovascular:     Rate and Rhythm: Normal rate and regular rhythm.     Pulses: Normal pulses.          Radial pulses are 2+ on the right side and 2+ on the left side.       Posterior tibial pulses are 2+ on the right side and 2+ on the left side.     Heart sounds: Normal heart sounds. No murmur. No friction rub. No gallop.   Pulmonary:     Effort: Pulmonary effort is normal. No respiratory distress.     Breath sounds: Normal breath sounds. No  stridor. No wheezing, rhonchi or rales.  Chest:     Chest wall: No tenderness.  Abdominal:     General: Bowel sounds are normal. There is no distension.     Palpations: Abdomen is soft.     Tenderness: There is no abdominal tenderness. There is no guarding or rebound.  Musculoskeletal: Normal range of motion.        General: No deformity.  Lymphadenopathy:     Cervical: No cervical adenopathy.  Skin:    General: Skin is warm and dry.     Capillary Refill: Capillary refill takes less than 2 seconds.     Coloration: Skin is not  pale.     Findings: No rash.  Neurological:     Mental Status: She is alert and oriented to person, place, and time.     Motor: No abnormal muscle tone.     Gait: Gait normal.  Psychiatric:        Speech: Speech normal.        Behavior: Behavior normal.       Diabetic Foot Exam: Diabetic Foot Exam - Simple   Simple Foot Form Diabetic Foot exam was performed with the following findings: Yes 12/27/2018  8:30 AM  Visual Inspection Sensation Testing Pulse Check Comments Foot exam done - diffusely dry skin (everywhere not just to feet)      PHQ2/9: Depression screen [facility] 2/9 12/27/2018 09/23/2018 01/26/2018 08/06/2017 04/07/2017  Decreased Interest 0 0 0 0 0  Down, Depressed, Hopeless 0 0 0 0 0  PHQ - 2 Score 0 0 0 0 0  Altered sleeping 0 0 0 - -  Tired, decreased energy 0 0 0 - -  Change in appetite 0 0 0 - -  Feeling bad or failure about yourself  0 0 0 - -  Trouble concentrating 0 0 0 - -  Moving slowly or fidgety/restless 0 0 0 - -  Suicidal thoughts 0 0 0 - -  PHQ-9 Score 0 0 0 - -  Difficult doing work/chores Not difficult at all Not difficult at all Not difficult at all - -    phq 9 is negative Reviewed today  Fall Risk: Fall Risk  12/27/2018 09/23/2018 02/14/2018 02/11/2018 01/26/2018  Falls in the past year? 0 0 No No No  Number falls in past yr: 0 - - - -  Injury with Fall? 0 - - - -     Functional Status Survey: Is the patient deaf or have difficulty hearing?: No Does the patient have difficulty seeing, even when wearing glasses/contacts?: No Does the patient have difficulty concentrating, remembering, or making decisions?: No Does the patient have difficulty walking or climbing stairs?: Yes Does the patient have difficulty dressing or bathing?: No Does the patient have difficulty doing errands alone such as visiting a doctor's office or shopping?: No    Assessment & Plan:     Problem List Items Addressed This Visit      Cardiovascular and Mediastinum    Essential hypertension (Chronic)    BP at goal/well controlled Continue meds      Relevant Medications   enalapril (VASOTEC) 20 MG tablet   metoprolol succinate (TOPROL-XL) 25 MG 24 hr tablet   pravastatin (PRAVACHOL) 80 MG tablet   Other Relevant Orders   COMPLETE METABOLIC PANEL WITH GFR     Digestive   GERD (gastroesophageal reflux disease)    Well controlled, continue protonix daily      Relevant Medications   pantoprazole (  PROTONIX) 20 MG tablet     Endocrine   Type 2 diabetes mellitus with diabetic nephropathy, without long-term current use of insulin (HCC) - Primary    Good sensation to feet today, foot exam done Discussed Amaryl medication and its risk for her age of hypoglycemia I have encouraged her to check her blood sugars daily and instructed her to hold Amaryl if she has any low blood sugars or she is skipping meals or ill -I have advised her that I would prefer to get her off of Amaryl and onto a different medication Labs done today      Relevant Medications   glucose blood (ACCU-CHEK GUIDE) test strip   enalapril (VASOTEC) 20 MG tablet   pravastatin (PRAVACHOL) 80 MG tablet   Other Relevant Orders   COMPLETE METABOLIC PANEL WITH GFR   Lipid panel   Hemoglobin A1c   Microalbumin, urine   Ambulatory referral to Ophthalmology     Other   Hyperlipidemia (Chronic)    Compliant with meds, no SE, no myalgias, fatigue or jaundice Due for FLP and recheck CMP       Relevant Medications   enalapril (VASOTEC) 20 MG tablet   metoprolol succinate (TOPROL-XL) 25 MG 24 hr tablet   pravastatin (PRAVACHOL) 80 MG tablet   Other Relevant Orders   COMPLETE METABOLIC PANEL WITH GFR   Lipid panel   Morbid obesity (HCC) (Chronic)    Dietary efforts, healthy food choices, decrease calories Limited ability to exercise due to MSK/OA issues Has lost some weight      Relevant Orders   COMPLETE METABOLIC PANEL WITH GFR   Lipid panel   Hemoglobin A1c    Other Visit  Diagnoses    Need for influenza vaccination       Relevant Orders   Flu Vaccine QUAD High Dose(Fluad) (Completed)   Medication monitoring encounter       Relevant Orders   CBC with Differential/Platelet   COMPLETE METABOLIC PANEL WITH GFR   Lipid panel   Hemoglobin A1c   Microalbumin, urine   Palpitation       well controlled on BB w/o concerning SE   Relevant Medications   metoprolol succinate (TOPROL-XL) 25 MG 24 hr tablet       Return in about 3 months (around 03/28/2019) for Routine follow-up.   Delsa Grana, PA-C 12/27/18 1:26 PM

## 2018-12-27 NOTE — Assessment & Plan Note (Signed)
Dietary efforts, healthy food choices, decrease calories Limited ability to exercise due to MSK/OA issues Has lost some weight

## 2018-12-27 NOTE — Assessment & Plan Note (Signed)
Compliant with meds, no SE, no myalgias, fatigue or jaundice Due for FLP and recheck CMP   

## 2018-12-28 LAB — COMPLETE METABOLIC PANEL WITH GFR
AG Ratio: 1.1 (calc) (ref 1.0–2.5)
ALT: 10 U/L (ref 6–29)
AST: 15 U/L (ref 10–35)
Albumin: 3.9 g/dL (ref 3.6–5.1)
Alkaline phosphatase (APISO): 73 U/L (ref 37–153)
BUN/Creatinine Ratio: 14 (calc) (ref 6–22)
BUN: 17 mg/dL (ref 7–25)
CO2: 29 mmol/L (ref 20–32)
Calcium: 9.3 mg/dL (ref 8.6–10.4)
Chloride: 101 mmol/L (ref 98–110)
Creat: 1.21 mg/dL — ABNORMAL HIGH (ref 0.60–0.93)
GFR, Est African American: 51 mL/min/{1.73_m2} — ABNORMAL LOW (ref >=60)
GFR, Est Non African American: 44 mL/min/{1.73_m2} — ABNORMAL LOW (ref >=60)
Globulin: 3.4 g/dL (calc) (ref 1.9–3.7)
Glucose, Bld: 212 mg/dL — ABNORMAL HIGH (ref 65–99)
Potassium: 4.9 mmol/L (ref 3.5–5.3)
Sodium: 139 mmol/L (ref 135–146)
Total Bilirubin: 0.4 mg/dL (ref 0.2–1.2)
Total Protein: 7.3 g/dL (ref 6.1–8.1)

## 2018-12-28 LAB — MICROALBUMIN, URINE: Microalb, Ur: 2.4 mg/dL

## 2018-12-28 LAB — HEMOGLOBIN A1C
Hgb A1c MFr Bld: 7.6 % of total Hgb — ABNORMAL HIGH (ref <5.7)
Mean Plasma Glucose: 171 (calc)
eAG (mmol/L): 9.5 (calc)

## 2018-12-28 LAB — CBC WITH DIFFERENTIAL/PLATELET
Absolute Monocytes: 648 cells/uL (ref 200–950)
Basophils Absolute: 65 cells/uL (ref 0–200)
Basophils Relative: 0.6 %
Eosinophils Absolute: 76 cells/uL (ref 15–500)
Eosinophils Relative: 0.7 %
HCT: 40.8 % (ref 35.0–45.0)
Hemoglobin: 12.7 g/dL (ref 11.7–15.5)
Lymphs Abs: 2354 cells/uL (ref 850–3900)
MCH: 27.5 pg (ref 27.0–33.0)
MCHC: 31.1 g/dL — ABNORMAL LOW (ref 32.0–36.0)
MCV: 88.5 fL (ref 80.0–100.0)
MPV: 10.3 fL (ref 7.5–12.5)
Monocytes Relative: 6 %
Neutro Abs: 7657 cells/uL (ref 1500–7800)
Neutrophils Relative %: 70.9 %
Platelets: 383 10*3/uL (ref 140–400)
RBC: 4.61 10*6/uL (ref 3.80–5.10)
RDW: 13.2 % (ref 11.0–15.0)
Total Lymphocyte: 21.8 %
WBC: 10.8 10*3/uL (ref 3.8–10.8)

## 2018-12-28 LAB — LIPID PANEL
Cholesterol: 147 mg/dL (ref <200)
HDL: 55 mg/dL (ref >=50)
LDL Cholesterol (Calc): 71 mg/dL (calc)
Non-HDL Cholesterol (Calc): 92 mg/dL (calc) (ref <130)
Total CHOL/HDL Ratio: 2.7 (calc) (ref <5.0)
Triglycerides: 129 mg/dL (ref <150)

## 2018-12-30 ENCOUNTER — Other Ambulatory Visit: Payer: Self-pay | Admitting: Family Medicine

## 2019-01-16 DIAGNOSIS — J449 Chronic obstructive pulmonary disease, unspecified: Secondary | ICD-10-CM | POA: Diagnosis not present

## 2019-02-15 DIAGNOSIS — J449 Chronic obstructive pulmonary disease, unspecified: Secondary | ICD-10-CM | POA: Diagnosis not present

## 2019-03-09 ENCOUNTER — Ambulatory Visit
Admission: RE | Admit: 2019-03-09 | Discharge: 2019-03-09 | Disposition: A | Discharge status: Home / Self Care | Payer: Medicare Other | Source: Ambulatory Visit | Attending: Family Medicine | Admitting: Family Medicine

## 2019-03-09 ENCOUNTER — Ambulatory Visit (INDEPENDENT_AMBULATORY_CARE_PROVIDER_SITE_OTHER): Payer: Medicare Other | Admitting: Family Medicine

## 2019-03-09 ENCOUNTER — Encounter: Payer: Self-pay | Admitting: Family Medicine

## 2019-03-09 ENCOUNTER — Ambulatory Visit
Admission: RE | Admit: 2019-03-09 | Discharge: 2019-03-09 | Disposition: A | Discharge status: Home / Self Care | Payer: Medicare Other | Attending: Family Medicine | Admitting: Family Medicine

## 2019-03-09 ENCOUNTER — Other Ambulatory Visit: Payer: Self-pay

## 2019-03-09 VITALS — BP 150/70 | HR 88 | Temp 97.5°F | Resp 14 | Ht 65.0 in | Wt 226.9 lb

## 2019-03-09 DIAGNOSIS — M25562 Pain in left knee: Secondary | ICD-10-CM

## 2019-03-09 DIAGNOSIS — M1712 Unilateral primary osteoarthritis, left knee: Secondary | ICD-10-CM

## 2019-03-09 DIAGNOSIS — N183 Chronic kidney disease, stage 3 unspecified: Secondary | ICD-10-CM | POA: Diagnosis not present

## 2019-03-09 MED ORDER — PREDNISONE 20 MG PO TABS: ORAL_TABLET | ORAL | 0 refills | Status: DC

## 2019-03-09 NOTE — Progress Notes (Signed)
Patient ID: Julie Keith, female    DOB: 06-Sep-1945, 73 y.o.   MRN: [id number]  PCP: Delsa Grana, PA-C  Chief Complaint  Patient presents with  . Leg Pain    pain located in the left knee cap area, started aprox 2 1/5 weeks ago.    Subjective:   Julie Keith is a 73 y.o. female, presents to clinic with CC of the following:  Knee Pain  Incident onset: 2-3 weeks ago. There was no injury mechanism. The pain is present in the left knee. The quality of the pain is described as aching (throbbing). The pain is moderate. The pain has been worsening since onset. Pertinent negatives include no inability to bear weight, loss of motion or muscle weakness. The symptoms are aggravated by movement, palpation and weight bearing (everything aggrivates it except resting w/o weight bearing). She has tried heat, acetaminophen and ice (topical medicines) for the symptoms. The treatment provided no relief.      Patient Active Problem List   Diagnosis Date Noted  . Psoriasis 10/10/2015  . GERD (gastroesophageal reflux disease) 10/10/2015  . Type 2 diabetes mellitus with diabetic nephropathy, without long-term current use of insulin ([place]) 10/31/2014  . Essential hypertension 10/31/2014  . Hyperlipidemia 10/31/2014  . Morbid obesity ([place]) 10/31/2014  . COPD (chronic obstructive pulmonary disease) ([place]) 06/28/2014       Current Outpatient Medications:  .  albuterol (PROVENTIL HFA;VENTOLIN HFA) 108 (90 Base) MCG/ACT inhaler, Inhale 2 puffs into the lungs every 6 (six) hours as needed for wheezing or shortness of breath., Disp: , Rfl:  .  Apremilast 30 MG TABS, Take 30 mg by mouth 2 (two) times daily., Disp: , Rfl:  .  aspirin EC 81 MG tablet, Take 1 tablet (81 mg total) by mouth daily., Disp: 90 tablet, Rfl: 3 .  Blood Glucose Monitoring Suppl (ACCU-CHEK GUIDE ME) w/Device KIT, Inject 1 Device into the skin daily. Dx:E11.9 LON:99, Disp: 1 kit, Rfl: 0 .  budesonide (PULMICORT) 0.5 MG/2ML nebulizer  solution, Take 0.5 mg by nebulization as needed. , Disp: , Rfl:  .  enalapril (VASOTEC) 20 MG tablet, Take 1 tablet (20 mg total) by mouth 2 (two) times daily., Disp: 180 tablet, Rfl: 1 .  glimepiride (AMARYL) 4 MG tablet, Take 1 tablet (4 mg total) by mouth daily with breakfast., Disp: 90 tablet, Rfl: 1 .  glucose blood (ACCU-CHEK COMPACT PLUS) test strip, USE ONE STRIP TO CHECK GLUCOSE ONCE a day if needed, Disp: 100 each, Rfl: 3 .  glucose blood (ACCU-CHEK GUIDE) test strip, Check blood sugars once daily DX:E11.9  LON:99, Disp: 100 each, Rfl: 12 .  hydrocortisone 2.5 % cream, as needed. , Disp: , Rfl: 1 .  hydrocortisone cream 1 %, APPLY 1 APPLICATION TOPICALLY TWICE DAILY, Disp: , Rfl: 1 .  Lancets (ACCU-CHEK MULTICLIX) lancets, DX:E11.21, LON:99, Disp: 100 each, Rfl: 12 .  metoprolol succinate (TOPROL-XL) 25 MG 24 hr tablet, Take 1 tablet (25 mg total) by mouth daily., Disp: 90 tablet, Rfl: 3 .  pantoprazole (PROTONIX) 20 MG tablet, Take 1 tablet (20 mg total) by mouth daily., Disp: 90 tablet, Rfl: 1 .  pravastatin (PRAVACHOL) 80 MG tablet, Take 1 tablet (80 mg total) by mouth daily., Disp: 90 tablet, Rfl: 1 .  triamcinolone ointment (KENALOG) 0.1 %, Apply topically as needed., Disp: , Rfl: 1   No Known Allergies   Family History  Problem Relation Age of Onset  . Leukemia Mother   . Pneumonia  Father   . COPD Father   . Diabetes Brother   . Cancer Brother        lung cancer  . Diabetes Sister      Social History   Socioeconomic History  . Marital status: Married    Spouse name: Not on file  . Number of children: Not on file  . Years of education: Not on file  . Highest education level: Not on file  Occupational History  . Not on file  Social Needs  . Financial resource strain: Not on file  . Food insecurity    Worry: Not on file    Inability: Not on file  . Transportation needs    Medical: Not on file    Non-medical: Not on file  Tobacco Use  . Smoking status:  Former Smoker    Packs/day: 2.00    Years: 30.00    Pack years: 60.00    Types: Cigarettes  . Smokeless tobacco: Never Used  . Tobacco comment: quit 2006  Substance and Sexual Activity  . Alcohol use: No    Alcohol/week: 0.0 standard drinks  . Drug use: No  . Sexual activity: Not Currently  Lifestyle  . Physical activity    Days per week: Not on file    Minutes per session: Not on file  . Stress: Not on file  Relationships  . Social Herbalist on phone: Not on file    Gets together: Not on file    Attends religious service: Not on file    Active member of club or organization: Not on file    Attends meetings of clubs or organizations: Not on file    Relationship status: Not on file  . Intimate partner violence    Fear of current or ex partner: Not on file    Emotionally abused: Not on file    Physically abused: Not on file    Forced sexual activity: Not on file  Other Topics Concern  . Not on file  Social History Narrative  . Not on file    I personally reviewed active problem list, medication list, allergies, family history, social history, health maintenance, notes from last encounter, lab results, imaging with the patient/caregiver today.   Review of Systems  Constitutional: Negative.   HENT: Negative.   Eyes: Negative.   Respiratory: Negative.   Cardiovascular: Negative.   Gastrointestinal: Negative.   Endocrine: Negative.   Genitourinary: Negative.   Musculoskeletal: Negative.   Skin: Negative.   Allergic/Immunologic: Negative.   Neurological: Negative.   Hematological: Negative.   Psychiatric/Behavioral: Negative.   All other systems reviewed and are negative.      Objective:   Vitals:   03/09/19 1424  BP: (!) 150/70  Pulse: 88  Resp: 14  Temp: (!) 97.5 F (36.4 C)  SpO2: 96%  Weight: 226 lb 14.4 oz (102.9 kg)  Height: _0  (1.651 m)    Body mass index is 37.76 kg/m.  Physical Exam Vitals signs and nursing note reviewed.   Constitutional:      Appearance: She is well-developed. She is obese.  HENT:     Head: Normocephalic and atraumatic.     Nose: Nose normal.  Eyes:     General:        Right eye: No discharge.        Left eye: No discharge.     Conjunctiva/sclera: Conjunctivae normal.  Neck:     Trachea: No tracheal deviation.  Cardiovascular:  Rate and Rhythm: Normal rate and regular rhythm.  Pulmonary:     Effort: Pulmonary effort is normal. No respiratory distress.     Breath sounds: No stridor.  Musculoskeletal:     Left knee: She exhibits decreased range of motion. She exhibits no swelling, no effusion, no ecchymosis, no erythema, normal alignment, normal patellar mobility and no bony tenderness. No tenderness found. No medial joint line, no lateral joint line, no MCL, no LCL and no patellar tendon tenderness noted.     Comments: Left knee no tenderness to popliteal fossa Only slightly decreased flexion to 90 degrees, good extension, normal gait  No lower extremity edema bilaterally and no asymmetry  Skin:    General: Skin is warm and dry.     Findings: No rash.  Neurological:     Mental Status: She is alert.     Motor: No abnormal muscle tone.     Coordination: Coordination normal.  Psychiatric:        Behavior: Behavior normal.      Results for orders placed or performed in visit on 12/27/18  CBC with Differential/Platelet  Result Value Ref Range   WBC 10.8 3.8 - 10.8 Thousand/uL   RBC 4.61 3.80 - 5.10 Million/uL   Hemoglobin 12.7 11.7 - 15.5 g/dL   HCT 40.8 35.0 - 45.0 %   MCV 88.5 80.0 - 100.0 fL   MCH 27.5 27.0 - 33.0 pg   MCHC 31.1 (L) 32.0 - 36.0 g/dL   RDW 13.2 11.0 - 15.0 %   Platelets 383 140 - 400 Thousand/uL   MPV 10.3 7.5 - 12.5 fL   Neutro Abs 7,657 1,500 - 7,800 cells/uL   Lymphs Abs 2,354 850 - 3,900 cells/uL   Absolute Monocytes 648 200 - 950 cells/uL   Eosinophils Absolute 76 15 - 500 cells/uL   Basophils Absolute 65 0 - 200 cells/uL   Neutrophils  Relative % 70.9 %   Total Lymphocyte 21.8 %   Monocytes Relative 6.0 %   Eosinophils Relative 0.7 %   Basophils Relative 0.6 %  COMPLETE METABOLIC PANEL WITH GFR  Result Value Ref Range   Glucose, Bld 212 (H) 65 - 99 mg/dL   BUN 17 7 - 25 mg/dL   Creat 1.21 (H) 0.60 - 0.93 mg/dL   GFR, Est Non African American 44 (L) > OR = 60 mL/min/1.13m   GFR, Est African American 51 (L) > OR = 60 mL/min/1.74m  BUN/Creatinine Ratio 14 6 - 22 (calc)   Sodium 139 135 - 146 mmol/L   Potassium 4.9 3.5 - 5.3 mmol/L   Chloride 101 98 - 110 mmol/L   CO2 29 20 - 32 mmol/L   Calcium 9.3 8.6 - 10.4 mg/dL   Total Protein 7.3 6.1 - 8.1 g/dL   Albumin 3.9 3.6 - 5.1 g/dL   Globulin 3.4 1.9 - 3.7 g/dL (calc)   AG Ratio 1.1 1.0 - 2.5 (calc)   Total Bilirubin 0.4 0.2 - 1.2 mg/dL   Alkaline phosphatase (APISO) 73 37 - 153 U/L   AST 15 10 - 35 U/L   ALT 10 6 - 29 U/L  Lipid panel  Result Value Ref Range   Cholesterol 147 <200 mg/dL   HDL 55 > OR = 50 mg/dL   Triglycerides 129 <150 mg/dL   LDL Cholesterol (Calc) 71 mg/dL (calc)   Total CHOL/HDL Ratio 2.7 <5.0 (calc)   Non-HDL Cholesterol (Calc) 92 <130 mg/dL (calc)  Hemoglobin A1c  Result Value Ref  Range   Hgb A1c MFr Bld 7.6 (H) <5.7 % of total Hgb   Mean Plasma Glucose 171 (calc)   eAG (mmol/L) 9.5 (calc)  Microalbumin, urine  Result Value Ref Range   Microalb, Ur 2.4 mg/dL   RAM          Assessment & Plan:      ICD-10-CM   1. Acute pain of left knee  M25.562 DG Knee Complete 4 Views Left   Gradual onset of pain without any injury no swelling redness today some decreased range of motion patient has good gait  2. Stage 3 chronic kidney disease, unspecified whether stage 3a or 3b CKD  N18.30 BMP w/ GFR - [facility]   Before encouraging her to use any NSAIDs like to recheck her renal function  3. Arthritis of left knee  M17.12 predniSONE (DELTASONE) 20 MG tablet   Suspect it will be osteoarthritis flare    Without injury is likely to be  arthritic or degenerative, currently does not appear inflamed and no concerns for any infection.  I did offer x-ray to evaluate the joint space, with her history of CKD we will recheck her labs and can give her steroids to try and reduce her inflammation, did encourage her to go to orthopedic for evaluation possible joint injection which might improve her symptoms.  She did not want Ortho and would rather do the x-ray outpatient and see if it improves on its own  Delsa Grana, PA-C 03/09/19 2:35 PM

## 2019-03-09 NOTE — Patient Instructions (Signed)
Osteoarthritis  Osteoarthritis is a type of arthritis that affects tissue that covers the ends of bones in joints (cartilage). Cartilage acts as a cushion between the bones and helps them move smoothly. Osteoarthritis results when cartilage in the joints gets worn down. Osteoarthritis is sometimes called "wear and tear" arthritis. Osteoarthritis is the most common form of arthritis. It often occurs in older people. It is a condition that gets worse over time (a progressive condition). Joints that are most often affected by this condition are in:  Fingers.  Toes.  Hips.  Knees.  Spine, including neck and lower back. What are the causes? This condition is caused by age-related wearing down of cartilage that covers the ends of bones. What increases the risk? The following factors may make you more likely to develop this condition:  Older age.  Being overweight or obese.  Overuse of joints, such as in athletes.  Past injury of a joint.  Past surgery on a joint.  Family history of osteoarthritis. What are the signs or symptoms? The main symptoms of this condition are pain, swelling, and stiffness in the joint. The joint may lose its shape over time. Small pieces of bone or cartilage may break off and float inside of the joint, which may cause more pain and damage to the joint. Small deposits of bone (osteophytes) may grow on the edges of the joint. Other symptoms may include:  A grating or scraping feeling inside the joint when you move it.  Popping or creaking sounds when you move. Symptoms may affect one or more joints. Osteoarthritis in a major joint, such as your knee or hip, can make it painful to walk or exercise. If you have osteoarthritis in your hands, you might not be able to grip items, twist your hand, or control small movements of your hands and fingers (fine motor skills). How is this diagnosed? This condition may be diagnosed based on:  Your medical history.  A  physical exam.  Your symptoms.  X-rays of the affected joint(s).  Blood tests to rule out other types of arthritis. How is this treated? There is no cure for this condition, but treatment can help to control pain and improve joint function. Treatment plans may include:  A prescribed exercise program that allows for rest and joint relief. You may work with a physical therapist.  A weight control plan.  Pain relief techniques, such as: ? Applying heat and cold to the joint. ? Electric pulses delivered to nerve endings under the skin (transcutaneous electrical nerve stimulation, or TENS). ? Massage. ? Certain nutritional supplements.  NSAIDs or prescription medicines to help relieve pain.  Medicine to help relieve pain and inflammation (corticosteroids). This can be given by mouth (orally) or as an injection.  Assistive devices, such as a brace, wrap, splint, specialized glove, or cane.  Surgery, such as: ? An osteotomy. This is done to reposition the bones and relieve pain or to remove loose pieces of bone and cartilage. ? Joint replacement surgery. You may need this surgery if you have very bad (advanced) osteoarthritis. Follow these instructions at home: Activity  Rest your affected joints as directed by your health care provider.  Do not drive or use heavy machinery while taking prescription pain medicine.  Exercise as directed. Your health care provider or physical therapist may recommend specific types of exercise, such as: ? Strengthening exercises. These are done to strengthen the muscles that support joints that are affected by arthritis. They can be performed   with weights or with exercise bands to add resistance. ? Aerobic activities. These are exercises, such as brisk walking or water aerobics, that get your heart pumping. ? Range-of-motion activities. These keep your joints easy to move. ? Balance and agility exercises. Managing pain, stiffness, and swelling       If directed, apply heat to the affected area as often as told by your health care provider. Use the heat source that your health care provider recommends, such as a moist heat pack or a heating pad. ? If you have a removable assistive device, remove it as told by your health care provider. ? Place a towel between your skin and the heat source. If your health care provider tells you to keep the assistive device on while you apply heat, place a towel between the assistive device and the heat source. ? Leave the heat on for 20-30 minutes. ? Remove the heat if your skin turns bright red. This is especially important if you are unable to feel pain, heat, or cold. You may have a greater risk of getting burned.  If directed, put ice on the affected joint: ? If you have a removable assistive device, remove it as told by your health care provider. ? Put ice in a plastic bag. ? Place a towel between your skin and the bag. If your health care provider tells you to keep the assistive device on during icing, place a towel between the assistive device and the bag. ? Leave the ice on for 20 minutes, 2-3 times a day. General instructions  Take over-the-counter and prescription medicines only as told by your health care provider.  Maintain a healthy weight. Follow instructions from your health care provider for weight control. These may include dietary restrictions.  Do not use any products that contain nicotine or tobacco, such as cigarettes and e-cigarettes. These can delay bone healing. If you need help quitting, ask your health care provider.  Use assistive devices as directed by your health care provider.  Keep all follow-up visits as told by your health care provider. This is important. Where to find more information  [employer] of Arthritis and Musculoskeletal and Skin Diseases: www.niams.[employer].es  [employer] on Aging: http://kim-miller.com/  American College of Rheumatology:  www.rheumatology.org Contact a health care provider if:  Your skin turns red.  You develop a rash.  You have pain that gets worse.  You have a fever along with joint or muscle aches. Get help right away if:  You lose a lot of weight.  You suddenly lose your appetite.  You have night sweats. Summary  Osteoarthritis is a type of arthritis that affects tissue covering the ends of bones in joints (cartilage).  This condition is caused by age-related wearing down of cartilage that covers the ends of bones.  The main symptom of this condition is pain, swelling, and stiffness in the joint.  There is no cure for this condition, but treatment can help to control pain and improve joint function. This information is not intended to replace advice given to you by your health care provider. Make sure you discuss any questions you have with your health care provider. Document Released: 04/06/2005 Document Revised: 03/19/2017 Document Reviewed: 12/09/2015 Elsevier Patient Education  2020 [employer]. Acute Knee Pain, Adult Many things can cause knee pain. Sometimes, knee pain is sudden (acute) and may be caused by damage, swelling, or irritation of the muscles and tissues that support your knee. The pain often goes away  on its own with time and rest. If the pain does not go away, tests may be done to find out what is causing the pain. Follow these instructions at home: Pay attention to any changes in your symptoms. Take these actions to relieve your pain. If you have a knee sleeve or brace:   Wear the sleeve or brace as told by your doctor. Remove it only as told by your doctor.  Loosen the sleeve or brace if your toes: ? Tingle. ? Become numb. ? Turn cold and blue.  Keep the sleeve or brace clean.  If the sleeve or brace is not waterproof: ? Do not let it get wet. ? Cover it with a watertight covering when you take a bath or shower. Activity  Rest your knee.  Do not do things  that cause pain.  Avoid activities where both feet leave the ground at the same time (high-impact activities). Examples are running, jumping rope, and doing jumping jacks.  Work with a physical therapist to make a safe exercise program, as told by your doctor. Managing pain, stiffness, and swelling   If told, put ice on the knee: ? Put ice in a plastic bag. ? Place a towel between your skin and the bag. ? Leave the ice on for 20 minutes, 2-3 times a day.  If told, put pressure (compression) on your injured knee to control swelling, give support, and help with discomfort. Compression may be done with an elastic bandage. General instructions  Take all medicines only as told by your doctor.  Raise (elevate) your knee while you are sitting or lying down. Make sure your knee is higher than your heart.  Sleep with a pillow under your knee.  Do not use any products that contain nicotine or tobacco. These include cigarettes, e-cigarettes, and chewing tobacco. These products may slow down healing. If you need help quitting, ask your doctor.  If you are overweight, work with your doctor and a food expert (dietitian) to set goals to lose weight. Being overweight can make your knee hurt more.  Keep all follow-up visits as told by your doctor. This is important. Contact a doctor if:  The knee pain does not stop.  The knee pain changes or gets worse.  You have a fever along with knee pain.  Your knee feels warm when you touch it.  Your knee gives out or locks up. Get help right away if:  Your knee swells, and the swelling gets worse.  You cannot move your knee.  You have very bad knee pain. Summary  Many things can cause knee pain. The pain often goes away on its own with time and rest.  Your doctor may do tests to find out the cause of the pain.  Pay attention to any changes in your symptoms. Relieve your pain with rest, medicines, light activity, and use of ice.  Get help  right away if you cannot move your knee or your knee pain is very bad. This information is not intended to replace advice given to you by your health care provider. Make sure you discuss any questions you have with your health care provider. Document Released: 07/03/2008 Document Revised: 09/16/2017 Document Reviewed: 09/16/2017 Elsevier Patient Education  2020 [employer].

## 2019-03-10 LAB — BASIC METABOLIC PANEL WITH GFR
BUN/Creatinine Ratio: 11 (calc) (ref 6–22)
BUN: 17 mg/dL (ref 7–25)
CO2: 27 mmol/L (ref 20–32)
Calcium: 9.6 mg/dL (ref 8.6–10.4)
Chloride: 101 mmol/L (ref 98–110)
Creat: 1.49 mg/dL — ABNORMAL HIGH (ref 0.60–0.93)
GFR, Est African American: 40 mL/min/{1.73_m2} — ABNORMAL LOW (ref >=60)
GFR, Est Non African American: 34 mL/min/{1.73_m2} — ABNORMAL LOW (ref >=60)
Glucose, Bld: 144 mg/dL — ABNORMAL HIGH (ref 65–99)
Potassium: 4.7 mmol/L (ref 3.5–5.3)
Sodium: 138 mmol/L (ref 135–146)

## 2019-03-18 DIAGNOSIS — J449 Chronic obstructive pulmonary disease, unspecified: Secondary | ICD-10-CM | POA: Diagnosis not present

## 2019-03-23 DIAGNOSIS — L400 Psoriasis vulgaris: Secondary | ICD-10-CM | POA: Diagnosis not present

## 2019-03-23 DIAGNOSIS — Z79899 Other long term (current) drug therapy: Secondary | ICD-10-CM | POA: Diagnosis not present

## 2019-04-17 ENCOUNTER — Ambulatory Visit: Payer: Medicare Other | Admitting: Family Medicine

## 2019-04-17 DIAGNOSIS — J449 Chronic obstructive pulmonary disease, unspecified: Secondary | ICD-10-CM | POA: Diagnosis not present

## 2019-05-11 ENCOUNTER — Other Ambulatory Visit: Payer: Self-pay

## 2019-05-11 ENCOUNTER — Ambulatory Visit: Payer: Medicare Other | Admitting: Family Medicine

## 2019-05-11 ENCOUNTER — Encounter: Payer: Self-pay | Admitting: Family Medicine

## 2019-05-11 VITALS — BP 138/84 | HR 93 | Temp 97.5°F | Resp 20 | Ht 65.0 in | Wt 219.2 lb

## 2019-05-11 DIAGNOSIS — E1121 Type 2 diabetes mellitus with diabetic nephropathy: Secondary | ICD-10-CM | POA: Diagnosis not present

## 2019-05-11 DIAGNOSIS — I10 Essential (primary) hypertension: Secondary | ICD-10-CM

## 2019-05-11 DIAGNOSIS — N183 Chronic kidney disease, stage 3 unspecified: Secondary | ICD-10-CM | POA: Diagnosis not present

## 2019-05-11 DIAGNOSIS — R002 Palpitations: Secondary | ICD-10-CM

## 2019-05-11 DIAGNOSIS — N179 Acute kidney failure, unspecified: Secondary | ICD-10-CM | POA: Diagnosis not present

## 2019-05-11 DIAGNOSIS — E782 Mixed hyperlipidemia: Secondary | ICD-10-CM

## 2019-05-11 DIAGNOSIS — K219 Gastro-esophageal reflux disease without esophagitis: Secondary | ICD-10-CM

## 2019-05-11 DIAGNOSIS — Z5181 Encounter for therapeutic drug level monitoring: Secondary | ICD-10-CM

## 2019-05-11 DIAGNOSIS — E119 Type 2 diabetes mellitus without complications: Secondary | ICD-10-CM

## 2019-05-11 MED ORDER — METOPROLOL SUCCINATE ER 25 MG PO TB24: 25.0000 mg | ORAL_TABLET | Freq: Every day | ORAL | 3 refills | Status: DC

## 2019-05-11 MED ORDER — ENALAPRIL MALEATE 20 MG PO TABS: 20.0000 mg | ORAL_TABLET | Freq: Two times a day (BID) | ORAL | 1 refills | Status: DC

## 2019-05-11 MED ORDER — GLIMEPIRIDE 4 MG PO TABS: 4.0000 mg | ORAL_TABLET | Freq: Every day | ORAL | 1 refills | Status: DC

## 2019-05-11 MED ORDER — PRAVASTATIN SODIUM 80 MG PO TABS: 80.0000 mg | ORAL_TABLET | Freq: Every day | ORAL | 1 refills | Status: DC

## 2019-05-11 MED ORDER — PANTOPRAZOLE SODIUM 20 MG PO TBEC: 20.0000 mg | DELAYED_RELEASE_TABLET | Freq: Every day | ORAL | 1 refills | Status: DC

## 2019-05-11 NOTE — Progress Notes (Signed)
Name: Julie Keith   MRN: [id number]    DOB: Jan 01, 1946   Date:05/11/2019       Progress Note  Chief Complaint  Patient presents with  . Follow-up  . COPD  . Diabetes     Subjective:   Julie Keith is a 74 y.o. female, presents to clinic for routine follow up on the conditions listed above.  HTN - unstable, uncontrolled BP last two OV here, repeated here with nervous pt - repeated BP better - controlled and at goal for age 10/84 BP Readings from Last 3 Encounters:  05/11/19 (!) 164/72  03/09/19 (!) 150/70  12/27/18 136/74  She states she did not take her medicine this morning Currently managed on enalapril 20 mg metoprolol xl 25 mg Pt reports good med compliance and denies any SE.   Pt denies CP, LE edema, palpitation, Ha's, visual disturbances, lightheadedness, hypotension, syncope. She continues to try to watch diet and avoid extra salt, no change to her baseline respiratory sx  Last labs showed worsening renal function, she was advised to return for repeat labs 11/202020, she never came back, today she states she didn't take any medicine or drink anything and she would like to come back another time for labs after hydrating - shes afraid they will be  Lab Results  Component Value Date   CREATININE 1.49 (H) 03/09/2019   Lab Results  Component Value Date   BUN 17 03/09/2019   Lab Results  Component Value Date   GFRNONAA 34 (L) 03/09/2019   Labs in sept 2020 sCr 1.21 and GFR 44, 01/2018 sCr 1.12 and GFR 1.12 12/2018 OV: Hypertension Watching BP at home, pt states its running "real good"   Metoprolol succinate '25mg'$  daily, enalapril '20mg'$ BID takes it as prescribed.  Denies headaches, dizziness, chest pain, blurry vision, fatigued, near syncope She has hx of palpitations, but metoprolol has helped and sx are much better, she denies orthopnea, LE edema,     BP Readings from Last 3 Encounters:  12/27/18 136/74  09/23/18 126/70  02/14/18 (!) 130/50   Sees Dr.  Fletcher Anon- cardiologist. - was supposed to f/up in one year, but isn't sure when appt is.   She would also like to have all medications under 1 provider.    Emphysema, 2L O2 via [place] - Pt denies any change to O2 demand, respiratory sx stable, still using inhalers and nebulizers only as needed  Panlobular emphysema Takes pulmicort PRN?  She is not really sure about her inhalers and later in the visit pulled out a inhaler out of her bag which is albuterol inhaler, has budesonide vials to use to neb machine Patient denies chronic cough, shortness of breath Is on O2 via [place] 2L  No changes to her baseline.   Last fall was last time she had COPD exacerbation - - when sick she uses pulmicort daily?  (possibly albuterol PRN?)  DM - unstable, uncontrolled, pt states her CBGs continue to be around 100, she is taking medications with out any concerns or SE Lab Results  Component Value Date   HGBA1C 7.6 (H) 12/27/2018  Last labs showed increased A1C, pt has been stressed with taking care of her husband, not taking care of herself as well as normal, she's trying not to go out at all due to covid Fasting CBGs she's rarely checking, not much change despite our last discussion about my concerns with se of hypoglycemia with her current and only med.  She continues to  deny hypoglycemic episodes Denies: Polyuria, polydipsia, polyphagia, vision changes, or neuropathy  Recent pertinent labs: Lab Results  Component Value Date   HGBA1C 7.6 (H) 12/27/2018   HGBA1C 7.0 (H) 01/26/2018   HGBA1C 7.5 (H) 08/06/2017      Component Value Date/Time   NA 138 03/09/2019 0000   NA 138 12/02/2016 0910   K 4.7 03/09/2019 0000   K 4.4 03/30/2013 1022   CL 101 03/09/2019 0000   CO2 27 03/09/2019 0000   GLUCOSE 144 (H) 03/09/2019 0000   BUN 17 03/09/2019 0000   BUN 14 12/02/2016 0910   CREATININE 1.49 (H) 03/09/2019 0000   CALCIUM 9.6 03/09/2019 0000   PROT 7.3 12/27/2018 0000   ALBUMIN 4.1 07/29/2016 0842   AST 15  12/27/2018 0000   ALT 10 12/27/2018 0000   ALKPHOS 87 07/29/2016 0842   BILITOT 0.4 12/27/2018 0000   GFRNONAA 34 (L) 03/09/2019 0000   GFRAA 40 (L) 03/09/2019 0000    Type 2 diabetes mellitus Blood sugar this AM was 99 Takes amaryl '4mg'$  daily -she takes this daily regardless of her blood sugars and is not on any other medication.   She states that metformin causes her to have headaches. Fasting blood sugars are usually around 100 she denies any hypoglycemia episodes.  She is checking her blood sugar very infrequently right now.  Does not want to change medications.  Her husband is on insulin and most of the medications have been very expensive for them to obtain. Recent Labs       Lab Results  Component Value Date   HGBA1C 7.0 (H) 01/26/2018     HLD:  Compliant with statin, states she take every night, no myalgias se or concerns, last labs lipid panel was optimal Lab Results  Component Value Date   CHOL 147 12/27/2018   HDL 55 12/27/2018   LDLCALC 71 12/27/2018   TRIG 129 12/27/2018   CHOLHDL 2.7 12/27/2018   12/27/2018:  Hyperlipidemia Ate breakfast - but hasn't had labs in almost a year - will do labs anyhow Pravastatin '80mg'$  daily- taking daily, no SE Working on diet, avoids salt and high fat food, fried foods, she is trying to eat whole grains (wheat bread) Recent Labs       Lab Results  Component Value Date   CHOL 155 01/26/2018   HDL 58 01/26/2018   LDLCALC 79 01/26/2018   TRIG 97 01/26/2018   CHOLHDL 2.7 01/26/2018      Morbid obesity -  Weight trending back down, -7 lbs since November visit, stable relative to weight over last year Wt Readings from Last 5 Encounters:  05/11/19 219 lb 3.2 oz (99.4 kg)  03/09/19 226 lb 14.4 oz (102.9 kg)  12/27/18 215 lb (97.5 kg)  09/23/18 217 lb (98.4 kg)  02/14/18 215 lb 8 oz (97.8 kg)    Patient Active Problem List   Diagnosis Date Noted  . Psoriasis 10/10/2015  . GERD (gastroesophageal reflux disease)  10/10/2015  . Type 2 diabetes mellitus with diabetic nephropathy, without long-term current use of insulin ([place]) 10/31/2014  . Essential hypertension 10/31/2014  . Hyperlipidemia 10/31/2014  . Morbid obesity ([place]) 10/31/2014  . COPD (chronic obstructive pulmonary disease) ([place]) 06/28/2014    Past Surgical History:  Procedure Laterality Date  . ABDOMINAL HYSTERECTOMY    . battery in back: astum    . CERVICAL SPINE SURGERY    . CHOLECYSTECTOMY    . MASTECTOMY Bilateral     Family History  Problem Relation Age of Onset  . Leukemia Mother   . Pneumonia Father   . COPD Father   . Diabetes Brother   . Cancer Brother        lung cancer  . Diabetes Sister     Social History   Socioeconomic History  . Marital status: Married    Spouse name: Not on file  . Number of children: Not on file  . Years of education: Not on file  . Highest education level: Not on file  Occupational History  . Not on file  Tobacco Use  . Smoking status: Former Smoker    Packs/day: 2.00    Years: 30.00    Pack years: 60.00    Types: Cigarettes  . Smokeless tobacco: Never Used  . Tobacco comment: quit 2006  Substance and Sexual Activity  . Alcohol use: No    Alcohol/week: 0.0 standard drinks  . Drug use: No  . Sexual activity: Not Currently  Other Topics Concern  . Not on file  Social History Narrative  . Not on file   Social Determinants of Health   Financial Resource Strain:   . Difficulty of Paying Living Expenses: Not on file  Food Insecurity:   . Worried About Charity fundraiser in the Last Year: Not on file  . Ran Out of Food in the Last Year: Not on file  Transportation Needs:   . Lack of Transportation (Medical): Not on file  . Lack of Transportation (Non-Medical): Not on file  Physical Activity:   . Days of Exercise per Week: Not on file  . Minutes of Exercise per Session: Not on file  Stress:   . Feeling of Stress : Not on file  Social Connections:   . Frequency of  Communication with Friends and Family: Not on file  . Frequency of Social Gatherings with Friends and Family: Not on file  . Attends Religious Services: Not on file  . Active Member of Clubs or Organizations: Not on file  . Attends Archivist Meetings: Not on file  . Marital Status: Not on file  Intimate Partner Violence:   . Fear of Current or Ex-Partner: Not on file  . Emotionally Abused: Not on file  . Physically Abused: Not on file  . Sexually Abused: Not on file     Current Outpatient Medications:  .  albuterol (PROVENTIL HFA;VENTOLIN HFA) 108 (90 Base) MCG/ACT inhaler, Inhale 2 puffs into the lungs every 6 (six) hours as needed for wheezing or shortness of breath., Disp: , Rfl:  .  Apremilast 30 MG TABS, Take 30 mg by mouth 2 (two) times daily., Disp: , Rfl:  .  aspirin EC 81 MG tablet, Take 1 tablet (81 mg total) by mouth daily., Disp: 90 tablet, Rfl: 3 .  Blood Glucose Monitoring Suppl (ACCU-CHEK GUIDE ME) w/Device KIT, Inject 1 Device into the skin daily. Dx:E11.9 LON:99, Disp: 1 kit, Rfl: 0 .  budesonide (PULMICORT) 0.5 MG/2ML nebulizer solution, Take 0.5 mg by nebulization as needed. , Disp: , Rfl:  .  enalapril (VASOTEC) 20 MG tablet, Take 1 tablet (20 mg total) by mouth 2 (two) times daily., Disp: 180 tablet, Rfl: 1 .  glimepiride (AMARYL) 4 MG tablet, Take 1 tablet (4 mg total) by mouth daily with breakfast., Disp: 90 tablet, Rfl: 1 .  glucose blood (ACCU-CHEK COMPACT PLUS) test strip, USE ONE STRIP TO CHECK GLUCOSE ONCE a day if needed, Disp: 100 each, Rfl: 3 .  glucose  blood (ACCU-CHEK GUIDE) test strip, Check blood sugars once daily DX:E11.9  LON:99, Disp: 100 each, Rfl: 12 .  hydrocortisone 2.5 % cream, as needed. , Disp: , Rfl: 1 .  hydrocortisone cream 1 %, APPLY 1 APPLICATION TOPICALLY TWICE DAILY, Disp: , Rfl: 1 .  Lancets (ACCU-CHEK MULTICLIX) lancets, DX:E11.21, LON:99, Disp: 100 each, Rfl: 12 .  metoprolol succinate (TOPROL-XL) 25 MG 24 hr tablet, Take 1  tablet (25 mg total) by mouth daily., Disp: 90 tablet, Rfl: 3 .  pantoprazole (PROTONIX) 20 MG tablet, Take 1 tablet (20 mg total) by mouth daily., Disp: 90 tablet, Rfl: 1 .  pravastatin (PRAVACHOL) 80 MG tablet, Take 1 tablet (80 mg total) by mouth daily., Disp: 90 tablet, Rfl: 1 .  triamcinolone ointment (KENALOG) 0.1 %, Apply topically as needed., Disp: , Rfl: 1 .  predniSONE (DELTASONE) 20 MG tablet, 2 tabs poqday 1-3, 1 tabs poqday 4-6 (Patient not taking: Reported on 05/11/2019), Disp: 9 tablet, Rfl: 0  No Known Allergies  Chart Review Today: I personally reviewed active problem list, medication list, allergies, family history, social history, health maintenance, notes from last encounter, lab results, imaging with the patient/caregiver today.   Review of Systems  Constitutional: Negative.   HENT: Negative.   Eyes: Negative.   Respiratory: Negative.   Cardiovascular: Negative.   Gastrointestinal: Negative.   Endocrine: Negative.   Genitourinary: Negative.   Musculoskeletal: Negative.   Skin: Negative.   Allergic/Immunologic: Negative.   Neurological: Negative.   Hematological: Negative.   Psychiatric/Behavioral: Negative.   All other systems reviewed and are negative.    Objective:    Vitals:   05/11/19 0811  BP: (!) 164/72  Pulse: 93  Resp: 20  Temp: (!) 97.5 F (36.4 C)  TempSrc: Temporal  SpO2: 98%  Weight: 219 lb 3.2 oz (99.4 kg)  Height: '5\' 5"'$  (1.651 m)    Body mass index is 36.48 kg/m.  Physical Exam Vitals and nursing note reviewed.  Constitutional:      General: She is not in acute distress.    Appearance: Normal appearance. She is well-developed. She is obese. She is not ill-appearing, toxic-appearing or diaphoretic.     Comments: On [place] oxygen and wearing a mask  HENT:     Head: Normocephalic and atraumatic.     Right Ear: External ear normal.     Left Ear: External ear normal.     Nose: Nose normal.     Mouth/Throat:     Mouth: Mucous  membranes are moist.     Pharynx: Oropharynx is clear. Uvula midline.  Eyes:     General: Lids are normal.     Conjunctiva/sclera: Conjunctivae normal.     Pupils: Pupils are equal, round, and reactive to light.  Neck:     Trachea: Phonation normal. No tracheal deviation.  Cardiovascular:     Rate and Rhythm: Normal rate and regular rhythm.     Pulses: Normal pulses.          Radial pulses are 2+ on the right side and 2+ on the left side.       Posterior tibial pulses are 2+ on the right side and 2+ on the left side.     Heart sounds: Normal heart sounds. No murmur. No friction rub. No gallop.   Pulmonary:     Effort: Pulmonary effort is normal. No respiratory distress.     Breath sounds: Normal breath sounds. No stridor. No wheezing, rhonchi or rales.  Chest:  Chest wall: No tenderness.  Abdominal:     General: Bowel sounds are normal. There is no distension.     Palpations: Abdomen is soft.     Tenderness: There is no abdominal tenderness. There is no guarding or rebound.  Musculoskeletal:        General: No deformity. Normal range of motion.     Cervical back: Normal range of motion and neck supple.  Lymphadenopathy:     Cervical: No cervical adenopathy.  Skin:    General: Skin is warm and dry.     Capillary Refill: Capillary refill takes less than 2 seconds.     Coloration: Skin is not pale.     Findings: Rash present.  Neurological:     Mental Status: She is alert.     Motor: Tremor present. No abnormal muscle tone.  Psychiatric:        Speech: Speech normal.        Behavior: Behavior normal.     Comments: Very anxious, cooperative       PHQ2/9: Depression screen [facility] 2/9 05/11/2019 03/09/2019 12/27/2018 09/23/2018 01/26/2018  Decreased Interest 3 0 0 0 0  Down, Depressed, Hopeless 0 0 0 0 0  PHQ - 2 Score 3 0 0 0 0  Altered sleeping 0 0 0 0 0  Tired, decreased energy 0 0 0 0 0  Change in appetite 0 0 0 0 0  Feeling bad or failure about yourself  0 0 0 0 0    Trouble concentrating 0 0 0 0 0  Moving slowly or fidgety/restless 0 0 0 0 0  Suicidal thoughts 0 0 0 0 0  PHQ-9 Score 3 0 0 0 0  Difficult doing work/chores Not difficult at all Not difficult at all Not difficult at all Not difficult at all Not difficult at all    phq 9 is neg - less than 4 - reviewed today  Fall Risk: Fall Risk  05/11/2019 03/09/2019 12/27/2018 09/23/2018 02/14/2018  Falls in the past year? 0 0 0 0 No  Number falls in past yr: 0 0 0 - -  Injury with Fall? 0 0 0 - -    Functional Status Survey: Is the patient deaf or have difficulty hearing?: No Does the patient have difficulty seeing, even when wearing glasses/contacts?: No Does the patient have difficulty concentrating, remembering, or making decisions?: No Does the patient have difficulty walking or climbing stairs?: No Does the patient have difficulty dressing or bathing?: No Does the patient have difficulty doing errands alone such as visiting a doctor's office or shopping?: No   Assessment & Plan:     ICD-10-CM   1. AKI (acute kidney injury) ([place])  W09.8 COMPLETE METABOLIC PANEL WITH GFR   recheck labs, pt wants to come back after hydrating  2. Stage 3 chronic kidney disease, unspecified whether stage 3a or 3b CKD  J19.14 COMPLETE METABOLIC PANEL WITH GFR   recheck labs  3. Type 2 diabetes mellitus with diabetic nephropathy, without long-term current use of insulin ([facility])  N82.95 COMPLETE METABOLIC PANEL WITH GFR    Hemoglobin A1c   unstable, last A1C increased and above goal, recheck labs today  4. Essential hypertension  A21 COMPLETE METABOLIC PANEL WITH GFR    enalapril (VASOTEC) 20 MG tablet    metoprolol succinate (TOPROL-XL) 25 MG 24 hr tablet   BP initially elevated, but with recheck at goal, likely elevated secondary to anxiety  5. Mixed hyperlipidemia  H08.6 COMPLETE METABOLIC PANEL  WITH GFR    Lipid panel    pravastatin (PRAVACHOL) 80 MG tablet   stable, has been well controlled - can space out  labs (6-12 m) if still doing well   6. Type 2 diabetes mellitus without complication, without long-term current use of insulin ([facility])  E11.9 glimepiride (AMARYL) 4 MG tablet   see above  7. Palpitation  R00.2 metoprolol succinate (TOPROL-XL) 25 MG 24 hr tablet   well controlled on BB w/o concerning SE, stable  8. Gastroesophageal reflux disease, unspecified whether esophagitis present  K21.9 pantoprazole (PROTONIX) 20 MG tablet   med refill, pt denies any new sx or concerns  9. Medication monitoring encounter  Z51.81 CBC with Differential/Platelet    COMPLETE METABOLIC PANEL WITH GFR    Lipid panel    Hemoglobin A1c     Return for 3 month f/up.   Delsa Grana, PA-C 05/11/19 8:20 AM

## 2019-05-11 NOTE — Patient Instructions (Signed)
Come back in the next week for your labs 8am - 12 noon and 2pm-4pm, no appointment needed  Return in 3-4 months for your regular check up

## 2019-05-16 DIAGNOSIS — I10 Essential (primary) hypertension: Secondary | ICD-10-CM | POA: Diagnosis not present

## 2019-05-16 DIAGNOSIS — N179 Acute kidney failure, unspecified: Secondary | ICD-10-CM | POA: Diagnosis not present

## 2019-05-16 DIAGNOSIS — E782 Mixed hyperlipidemia: Secondary | ICD-10-CM | POA: Diagnosis not present

## 2019-05-16 DIAGNOSIS — E1121 Type 2 diabetes mellitus with diabetic nephropathy: Secondary | ICD-10-CM | POA: Diagnosis not present

## 2019-05-17 LAB — COMPLETE METABOLIC PANEL WITH GFR
AG Ratio: 1.2 (calc) (ref 1.0–2.5)
ALT: 8 U/L (ref 6–29)
AST: 12 U/L (ref 10–35)
Albumin: 3.9 g/dL (ref 3.6–5.1)
Alkaline phosphatase (APISO): 87 U/L (ref 37–153)
BUN/Creatinine Ratio: 11 (calc) (ref 6–22)
BUN: 13 mg/dL (ref 7–25)
CO2: 32 mmol/L (ref 20–32)
Calcium: 9.6 mg/dL (ref 8.6–10.4)
Chloride: 99 mmol/L (ref 98–110)
Creat: 1.14 mg/dL — ABNORMAL HIGH (ref 0.60–0.93)
GFR, Est African American: 54 mL/min/{1.73_m2} — ABNORMAL LOW (ref >=60)
GFR, Est Non African American: 47 mL/min/{1.73_m2} — ABNORMAL LOW (ref >=60)
Globulin: 3.3 g/dL (calc) (ref 1.9–3.7)
Glucose, Bld: 155 mg/dL — ABNORMAL HIGH (ref 65–99)
Potassium: 5.3 mmol/L (ref 3.5–5.3)
Sodium: 139 mmol/L (ref 135–146)
Total Bilirubin: 0.4 mg/dL (ref 0.2–1.2)
Total Protein: 7.2 g/dL (ref 6.1–8.1)

## 2019-05-17 LAB — CBC WITH DIFFERENTIAL/PLATELET
Absolute Monocytes: 782 cells/uL (ref 200–950)
Basophils Absolute: 69 cells/uL (ref 0–200)
Basophils Relative: 0.6 %
Eosinophils Absolute: 138 cells/uL (ref 15–500)
Eosinophils Relative: 1.2 %
HCT: 40.4 % (ref 35.0–45.0)
Hemoglobin: 12.8 g/dL (ref 11.7–15.5)
Lymphs Abs: 3220 cells/uL (ref 850–3900)
MCH: 27.7 pg (ref 27.0–33.0)
MCHC: 31.7 g/dL — ABNORMAL LOW (ref 32.0–36.0)
MCV: 87.4 fL (ref 80.0–100.0)
MPV: 9.8 fL (ref 7.5–12.5)
Monocytes Relative: 6.8 %
Neutro Abs: 7291 cells/uL (ref 1500–7800)
Neutrophils Relative %: 63.4 %
Platelets: 415 10*3/uL — ABNORMAL HIGH (ref 140–400)
RBC: 4.62 10*6/uL (ref 3.80–5.10)
RDW: 13 % (ref 11.0–15.0)
Total Lymphocyte: 28 %
WBC: 11.5 10*3/uL — ABNORMAL HIGH (ref 3.8–10.8)

## 2019-05-17 LAB — LIPID PANEL
Cholesterol: 161 mg/dL (ref <200)
HDL: 55 mg/dL (ref >=50)
LDL Cholesterol (Calc): 85 mg/dL (calc)
Non-HDL Cholesterol (Calc): 106 mg/dL (calc) (ref <130)
Total CHOL/HDL Ratio: 2.9 (calc) (ref <5.0)
Triglycerides: 116 mg/dL (ref <150)

## 2019-05-17 LAB — HEMOGLOBIN A1C
Hgb A1c MFr Bld: 7.9 % of total Hgb — ABNORMAL HIGH (ref <5.7)
Mean Plasma Glucose: 180 (calc)
eAG (mmol/L): 10 (calc)

## 2019-05-18 DIAGNOSIS — J449 Chronic obstructive pulmonary disease, unspecified: Secondary | ICD-10-CM | POA: Diagnosis not present

## 2019-05-29 DIAGNOSIS — E119 Type 2 diabetes mellitus without complications: Secondary | ICD-10-CM | POA: Diagnosis not present

## 2019-05-29 LAB — HM DIABETES EYE EXAM

## 2019-06-18 DIAGNOSIS — J449 Chronic obstructive pulmonary disease, unspecified: Secondary | ICD-10-CM | POA: Diagnosis not present

## 2019-06-29 ENCOUNTER — Telehealth: Payer: Self-pay | Admitting: Family Medicine

## 2019-06-29 NOTE — Telephone Encounter (Signed)
Copied from CRM [id number]. Topic: General - Other >> Jun 29, 2019  4:10 PM Tamela Oddi wrote: Reason for CRM: Patient called to ask the nurse or doctor to write a script for a new diabetic meter, because the current one is not covered under her insurance.  Please call to discuss at [phone]

## 2019-06-30 ENCOUNTER — Telehealth: Payer: Self-pay | Admitting: Family Medicine

## 2019-06-30 NOTE — Telephone Encounter (Signed)
Patient states insurance will only cover one touch ultra 2 patient requesting the entire kit, please send new Rx. Informed patient please allow 48 to 72 hour turn around time   [place], [place] Phone:  [phone]  Fax:  [phone]

## 2019-07-03 NOTE — Telephone Encounter (Signed)
Asher Muir will you please send in (print and fax and I'll sign, or call in) covered meter.  Medicare may only cover everything separate - so it may help to call and talk to the pharmacist directly so it doesn't come back to [place] several times.  Put in as VO by me, I will cosign in chart or sign on paper, Thanks Danelle Berry, PA-C

## 2019-07-04 ENCOUNTER — Telehealth: Payer: Self-pay

## 2019-07-04 MED ORDER — ONETOUCH ULTRA 2 W/DEVICE KIT: 1.0000 | PACK | 0 refills | Status: AC

## 2019-07-04 MED ORDER — ONETOUCH DELICA LANCETS 33G MISC: 1.0000 | Freq: Three times a day (TID) | 11 refills | Status: DC

## 2019-07-04 MED ORDER — GLUCOSE BLOOD VI STRP: ORAL_STRIP | 12 refills | Status: DC

## 2019-07-04 NOTE — Telephone Encounter (Signed)
Copied from [place] [id number]. Topic: General - Other >> Jul 04, 2019 10:55 AM Leward Quan A wrote: Reason for CRM: Patient called to say that she received her new Glucometer Blood Glucose Monitoring Suppl (ONE TOUCH ULTRA 2) w/Device KIT but did not get any test strips or lancets to go with it. Asking for Rx to be sent to her pharmacy please.

## 2019-07-04 NOTE — Telephone Encounter (Signed)
I had already ordered all you had to do was sign?  But I will go ahead and sign.

## 2019-07-16 DIAGNOSIS — J449 Chronic obstructive pulmonary disease, unspecified: Secondary | ICD-10-CM | POA: Diagnosis not present

## 2019-08-16 DIAGNOSIS — J449 Chronic obstructive pulmonary disease, unspecified: Secondary | ICD-10-CM | POA: Diagnosis not present

## 2019-09-12 ENCOUNTER — Ambulatory Visit: Payer: Medicare Other | Admitting: Family Medicine

## 2019-09-15 DIAGNOSIS — J449 Chronic obstructive pulmonary disease, unspecified: Secondary | ICD-10-CM | POA: Diagnosis not present

## 2019-09-20 NOTE — Progress Notes (Signed)
Patient ID: Julie Keith, female    DOB: 09-Jul-1945, 74 y.o.   MRN: [id number]  PCP: Towanda Malkin, MD  Chief Complaint  Patient presents with  . Follow-up  . Diabetes  . Hypertension    Subjective:   Julie Keith is a 74 y.o. female, presents to clinic with CC of the following:  Chief Complaint  Patient presents with  . Follow-up  . Diabetes  . Hypertension    HPI:  Patient is a 74 year old female patient of Delsa Grana Last seen by her on 05/11/2019 Follows up today. Noted husband a cancer patient at the beginning of the visit, and the challenges that she has trying to care for him presently resulting from this.   HTN -patient has had some concerning higher blood pressure readings on recent visits A repeated BP was better on last visit noted She does check her blood pressures at home, with a smart watch, 122/69 now, not higher at home on checks. Not take meds this am yet. BP Readings from Last 3 Encounters:  09/21/19 (!) 144/72  05/11/19 138/84  03/09/19 (!) 150/70   Med regimen -  enalapril 20 mg, metoprolol XL 25 mg Pt reports good med compliance  Pt denies CP, increased LE edema, palpitation, increased headaches, vision changes She continues to try to watch diet   CKD -labs rechecked in January showed a slightly improved creatinine from previous. Lab Results  Component Value Date   CREATININE 1.14 (H) 05/16/2019   BUN 13 05/16/2019   NA 139 05/16/2019   K 5.3 05/16/2019   CL 99 05/16/2019   CO2 32 05/16/2019   Labs in Nov, 2020 - Cr - 1.49, Sept, 2020 was 1.21   Emphysema, 2L O2 via [place] - Pt denies any change to O2 demand, respiratory sx stable and uses O2 mainly at night, stated has not used inhalers in a couple years now, has nebulizers and albuterol inh to use, and does so only as needed Patient denies chronic cough, Is on O2 via [place] 2L Has not f/u'ed with pulmonary in last 3+ years   DM -not well controlled in recent  past She does check blood sugars at home - once a day, most of the time good, 116 in am   A few low readings, in the 40's a few times and gets OJ, mainly happens if out walking a lot Med regimen - Amaryl-4 mg daily (noted previously that Metformin causes her to have headache) she is taking medications as prescribed A1c's have been increasing in the past as noted below Lab Results  Component Value Date   HGBA1C 7.9 (H) 05/16/2019   HGBA1C 7.6 (H) 12/27/2018   HGBA1C 7.0 (H) 01/26/2018   Lab Results  Component Value Date   MICROALBUR 2.4 12/27/2018   [place] 85 05/16/2019   CREATININE 1.14 (H) 05/16/2019    pt noted has been stressed with taking care of her husband, not taking care of herself as well (as she noted previously her husband is on insulin and most of the medications have been very expensive for them to get) Denies: Increased thirst, increased urination, numbness or tingling in the extremities, vision concerns   HLD:  Compliant with statin -pravastatin 80 mg daily, states she take every night,  no increased myalgias   Goal is LDL less than 70 Lab Results  Component Value Date   CHOL 161 05/16/2019   HDL 55 05/16/2019   LDLCALC 85 05/16/2019  TRIG 116 05/16/2019   CHOLHDL 2.9 05/16/2019    Morbid obesity -  last visit, it was noted that the weight was trending back down, and has remained fairly stable from last visit, noted up about 3 pounds Wt Readings from Last 3 Encounters:  09/21/19 222 lb (100.7 kg)  05/11/19 219 lb 3.2 oz (99.4 kg)  03/09/19 226 lb 14.4 oz (102.9 kg)    GERD -patient is on a PPI She takes daily and notes does control symptoms Discussed potential risks of long-term chronic use of PPIs today.  Psoriasis -  Sees Dr. Lockie Mola, more recently has been put on [employer] product,   Patient Active Problem List   Diagnosis Date Noted  . Psoriasis 10/10/2015  . GERD (gastroesophageal reflux disease) 10/10/2015  . Type 2 diabetes mellitus with  diabetic nephropathy, without long-term current use of insulin ([place]) 10/31/2014  . Essential hypertension 10/31/2014  . Hyperlipidemia 10/31/2014  . Morbid obesity ([place]) 10/31/2014  . COPD (chronic obstructive pulmonary disease) ([place]) 06/28/2014      Current Outpatient Medications:  .  albuterol (PROVENTIL HFA;VENTOLIN HFA) 108 (90 Base) MCG/ACT inhaler, Inhale 2 puffs into the lungs every 6 (six) hours as needed for wheezing or shortness of breath., Disp: , Rfl:  .  Apremilast 30 MG TABS, Take 30 mg by mouth 2 (two) times daily., Disp: , Rfl:  .  aspirin EC 81 MG tablet, Take 1 tablet (81 mg total) by mouth daily., Disp: 90 tablet, Rfl: 3 .  Blood Glucose Monitoring Suppl (ACCU-CHEK GUIDE ME) w/Device KIT, Inject 1 Device into the skin daily. Dx:E11.9 LON:99, Disp: 1 kit, Rfl: 0 .  budesonide (PULMICORT) 0.5 MG/2ML nebulizer solution, Take 0.5 mg by nebulization as needed. , Disp: , Rfl:  .  enalapril (VASOTEC) 20 MG tablet, Take 1 tablet (20 mg total) by mouth 2 (two) times daily., Disp: 180 tablet, Rfl: 1 .  glimepiride (AMARYL) 4 MG tablet, Take 1 tablet (4 mg total) by mouth daily with breakfast., Disp: 90 tablet, Rfl: 1 .  glucose blood (ACCU-CHEK COMPACT PLUS) test strip, USE ONE STRIP TO CHECK GLUCOSE ONCE a day if needed, Disp: 100 each, Rfl: 3 .  glucose blood (ACCU-CHEK GUIDE) test strip, Check blood sugars once daily DX:E11.9  LON:99, Disp: 100 each, Rfl: 12 .  glucose blood test strip, Use as instructed, Disp: 100 each, Rfl: 12 .  hydrocortisone 2.5 % cream, as needed. , Disp: , Rfl: 1 .  hydrocortisone cream 1 %, APPLY 1 APPLICATION TOPICALLY TWICE DAILY, Disp: , Rfl: 1 .  Lancets (ACCU-CHEK MULTICLIX) lancets, DX:E11.21, LON:99, Disp: 100 each, Rfl: 12 .  metoprolol succinate (TOPROL-XL) 25 MG 24 hr tablet, Take 1 tablet (25 mg total) by mouth daily., Disp: 90 tablet, Rfl: 3 .  OneTouch Delica Lancets 31D MISC, 1 Bottle by Does not apply route in the morning, at noon, and at  bedtime., Disp: 100 each, Rfl: 11 .  pantoprazole (PROTONIX) 20 MG tablet, Take 1 tablet (20 mg total) by mouth daily., Disp: 90 tablet, Rfl: 1 .  pravastatin (PRAVACHOL) 80 MG tablet, Take 1 tablet (80 mg total) by mouth daily., Disp: 90 tablet, Rfl: 1 .  triamcinolone ointment (KENALOG) 0.1 %, Apply topically as needed., Disp: , Rfl: 1   No Known Allergies   Past Surgical History:  Procedure Laterality Date  . ABDOMINAL HYSTERECTOMY    . battery in back: astum    . CERVICAL SPINE SURGERY    . CHOLECYSTECTOMY    .  MASTECTOMY Bilateral      Family History  Problem Relation Age of Onset  . Leukemia Mother   . Pneumonia Father   . COPD Father   . Diabetes Brother   . Cancer Brother        lung cancer  . Diabetes Sister      Social History   Tobacco Use  . Smoking status: Former Smoker    Packs/day: 2.00    Years: 30.00    Pack years: 60.00    Types: Cigarettes  . Smokeless tobacco: Never Used  . Tobacco comment: quit 2006  Substance Use Topics  . Alcohol use: No    Alcohol/week: 0.0 standard drinks    With staff assistance, above reviewed with the patient today.  ROS: As per HPI, otherwise no specific complaints on a limited and focused system review   No results found for this or any previous visit (from the past 72 hour(s)).   PHQ2/9: Depression screen [facility] 2/9 05/11/2019 03/09/2019 12/27/2018 09/23/2018 01/26/2018  Decreased Interest 3 0 0 0 0  Down, Depressed, Hopeless 0 0 0 0 0  PHQ - 2 Score 3 0 0 0 0  Altered sleeping 0 0 0 0 0  Tired, decreased energy 0 0 0 0 0  Change in appetite 0 0 0 0 0  Feeling bad or failure about yourself  0 0 0 0 0  Trouble concentrating 0 0 0 0 0  Moving slowly or fidgety/restless 0 0 0 0 0  Suicidal thoughts 0 0 0 0 0  PHQ-9 Score 3 0 0 0 0  Difficult doing work/chores Not difficult at all Not difficult at all Not difficult at all Not difficult at all Not difficult at all   PHQ-2/9 Result reviewed  Fall Risk: Fall Risk   05/11/2019 03/09/2019 12/27/2018 09/23/2018 02/14/2018  Falls in the past year? 0 0 0 0 No  Number falls in past yr: 0 0 0 - -  Injury with Fall? 0 0 0 - -      Objective:   Vitals:   09/21/19 0742  BP: (!) 144/72  Pulse: 70  Resp: 20  Temp: 98.3 F (36.8 C)  TempSrc: Temporal  SpO2: 99%  Weight: 222 lb (100.7 kg)  Height: _0  (1.651 m)    Body mass index is 36.94 kg/m.  Blood pressure recheck was 130/80  Physical Exam   NAD, masked HEENT - [place]/AT, sclera anicteric, PERRL, EOMI, conj - non-inj'ed, pharynx clear Neck - supple, no adenopathy, carotids 2+ and = without bruits bilat Car - RRR without m/g/r Pulm- RR and effort normal at rest, CTA without wheeze or rales Abd - soft, NT, ND,  Back - no CVA tenderness Ext - no LE edema, no active joints Neuro/psychiatric - affect was not flat, appropriate with conversation  Alert   Grossly non-focal   Speech normal   Results for orders placed or performed in visit on 06/01/19  HM DIABETES EYE EXAM  Result Value Ref Range   HM Diabetic Eye Exam No Retinopathy No Retinopathy   A1c today - 7.8%    Assessment & Plan:   1. Type 2 diabetes mellitus with diabetic nephropathy, without long-term current use of insulin (HCC) Blood sugars seem to be fairly well controlled with home checks, and concerned she has had a few lower readings noted, in the 40s, and quickly improves with orange juice and hydration. Her only medicine currently is Amaryl.  I did note there are other  medicines that I think would be better to be taking presently, and she notes she cannot take Metformin due to it causing headaches.  I did note other medicines that can be helpful, including a lot of the newer medicines now available in the SGLT 2 class, although she did notice significant cost concern with adding any new medicines presently. Checked the A1c today and it was 7.8.  (Had not increased further) We will continue the Amaryl presently, as she really wanted  to continue with this medicine and not add another at this time, and will continue to monitor. Ideally, would like to try to get her on a different medicine for her blood sugar control, and help can do so in the near future. - POCT HgB A1C  2. Essential hypertension Her blood pressure seems to be controlled on her current regimen presently They also seem to be controlled on checks at home, although she does get checks through a watch type device and question accuracy. Continue her current blood pressure regimen presently  3. Mixed hyperlipidemia Her last lipid panel at the end of January showed an LDL of 85, slightly above the goal of less than 70 on a fairly high dose of the pravastatin (80 mg). She may benefit from a product like Vascepa in addition, although not anxious to add further medicines to help presently. Emphasized the importance of dietary modifications and trying to stay more physically active to help in addition to the medicine and continue to monitor  4. Stage 3 chronic kidney disease, unspecified whether stage 3a or 3b CKD Her kidney function improved on most recent check in January, and continue to monitor.  5. Morbid obesity ([place]) Her weight has remained fairly stable, and emphasized the importance of a healthy weight maintenance to help with her other medical conditions above, especially her blood sugar levels and her blood pressure.  6. Panlobular emphysema ([place]) She seems to be fairly stable utilizing her oxygen, mostly at nights.  She does not take inhalers at this time routinely, and some have been prescribed in the past to help which she has not continued.  She uses a nebulizer and albuterol as needed only.  She states she has not needed them much in the recent past.  She does not want to follow-up with pulmonary. Do think she may benefit from use of a daily inhaler, although she was not anxious to trying to return to any of them presently.  She thinks she has good  control with the intermittent nightly oxygen use presently.  7. Gastroesophageal reflux disease, unspecified whether esophagitis present Continues to take a PPI, and again noted today concerns with potential risks of long-term use of PPIs.  8. Medication monitoring encounter As above noted, she has great concerns with cost of medicines with respect to trying to change or add a medicine and limits this presently.  Follow-up again in approximately 4 months time, sooner as needed.     Towanda Malkin, MD 09/21/19 7:49 AM

## 2019-09-21 ENCOUNTER — Ambulatory Visit: Payer: Medicare Other | Admitting: Internal Medicine

## 2019-09-21 ENCOUNTER — Other Ambulatory Visit: Payer: Self-pay

## 2019-09-21 ENCOUNTER — Encounter: Payer: Self-pay | Admitting: Internal Medicine

## 2019-09-21 VITALS — BP 130/80 | HR 70 | Temp 98.3°F | Resp 20 | Ht 65.0 in | Wt 222.0 lb

## 2019-09-21 DIAGNOSIS — E1121 Type 2 diabetes mellitus with diabetic nephropathy: Secondary | ICD-10-CM

## 2019-09-21 DIAGNOSIS — I10 Essential (primary) hypertension: Secondary | ICD-10-CM | POA: Diagnosis not present

## 2019-09-21 DIAGNOSIS — E782 Mixed hyperlipidemia: Secondary | ICD-10-CM | POA: Diagnosis not present

## 2019-09-21 DIAGNOSIS — N1832 Chronic kidney disease, stage 3b: Secondary | ICD-10-CM | POA: Insufficient documentation

## 2019-09-21 DIAGNOSIS — J431 Panlobular emphysema: Secondary | ICD-10-CM

## 2019-09-21 DIAGNOSIS — N183 Chronic kidney disease, stage 3 unspecified: Secondary | ICD-10-CM

## 2019-09-21 DIAGNOSIS — K219 Gastro-esophageal reflux disease without esophagitis: Secondary | ICD-10-CM

## 2019-09-21 DIAGNOSIS — Z5181 Encounter for therapeutic drug level monitoring: Secondary | ICD-10-CM

## 2019-09-21 LAB — POCT GLYCOSYLATED HEMOGLOBIN (HGB A1C): Hemoglobin A1C: 7.8 % — AB (ref 4.0–5.6)

## 2019-09-21 NOTE — Patient Instructions (Signed)
Type 2 Diabetes Mellitus, Self Care, Adult When you have type 2 diabetes (type 2 diabetes mellitus), you must make sure your blood sugar (glucose) stays in a healthy range. You can do this with:  Nutrition.  Exercise.  Lifestyle changes.  Medicines or insulin, if needed.  Support from your doctors and others. How to stay aware of blood sugar   Check your blood sugar level every day, as often as told.  Have your A1c (hemoglobin A1c) level checked two or more times a year. Have it checked more often if your doctor tells you to. Your doctor will set personal treatment goals for you. Generally, you should have these blood sugar levels:  Before meals (preprandial): 80-130 mg/dL (4.4-7.2 mmol/L).  After meals (postprandial): below 180 mg/dL (10 mmol/L).  A1c level: less than 7%. How to manage high and low blood sugar Signs of high blood sugar High blood sugar is called hyperglycemia. Know the signs of high blood sugar. Signs may include:  Feeling: ? Thirsty. ? Hungry. ? Very tired.  Needing to pee (urinate) more than usual.  Blurry vision. Signs of low blood sugar Low blood sugar is called hypoglycemia. This is when blood sugar is at or below 70 mg/dL (3.9 mmol/L). Signs may include:  Feeling: ? Hungry. ? Worried or nervous (anxious). ? Sweaty and clammy. ? Confused. ? Dizzy. ? Sleepy. ? Sick to your stomach (nauseous).  Having: ? A fast heartbeat. ? A headache. ? A change in your vision. ? Jerky movements that you cannot control (seizure). ? Tingling or no feeling (numbness) around your mouth, lips, or tongue.  Having trouble with: ? Moving (coordination). ? Sleeping. ? Passing out (fainting). ? Getting upset easily (irritability). Treating low blood sugar To treat low blood sugar, eat or drink something sugary right away. If you can think clearly and swallow safely, follow the 15:15 rule:  Take 15 grams of a fast-acting carb (carbohydrate). Talk with your  doctor about how much you should take.  Some fast-acting carbs are: ? Sugar tablets (glucose pills). Take 3-4 pills. ? 6-8 pieces of hard candy. ? 4-6 oz (120-150 mL) of fruit juice. ? 4-6 oz (120-150 mL) of regular (not diet) soda. ? 1 Tbsp (15 mL) honey or sugar.  Check your blood sugar 15 minutes after you take the carb.  If your blood sugar is still at or below 70 mg/dL (3.9 mmol/L), take 15 grams of a carb again.  If your blood sugar does not go above 70 mg/dL (3.9 mmol/L) after 3 tries, get help right away.  After your blood sugar goes back to normal, eat a meal or a snack within 1 hour. Treating very low blood sugar If your blood sugar is at or below 54 mg/dL (3 mmol/L), you have very low blood sugar (severe hypoglycemia). This is an emergency. Do not wait to see if the symptoms will go away. Get medical help right away. Call your local emergency services (911 in the [place] If you have very low blood sugar and you cannot eat or drink, you may need a glucagon shot (injection). A family member or friend should learn how to check your blood sugar and how to give you a glucagon shot. Ask your doctor if you need to have a glucagon shot kit at home. Follow these instructions at home: Medicine  Take insulin and diabetes medicines as told.  If your doctor says you should take more or less insulin and medicines, do this exactly as told.  Do not run out of insulin or medicines. Having diabetes can raise your risk for other long-term conditions. These include heart disease and kidney disease. Your doctor may prescribe medicines to help you not have these problems. Food   Make healthy food choices. These include: ? Chicken, fish, egg whites, and beans. ? Oats, whole wheat, bulgur, brown rice, quinoa, and millet. ? Fresh fruits and vegetables. ? Low-fat dairy products. ? Nuts, avocado, olive oil, and canola oil.  Meet with a food specialist (dietitian). He or she can help you make an  eating plan that is right for you.  Follow instructions from your doctor about what you cannot eat or drink.  Drink enough fluid to keep your pee (urine) pale yellow.  Keep track of carbs that you eat. Do this by reading food labels and learning food serving sizes.  Follow your sick day plan when you cannot eat or drink normally. Make this plan with your doctor so it is ready to use. Activity  Exercise 3 or more times a week.  Do not go more than 2 days without exercising.  Talk with your doctor before you start a new exercise. Your doctor may need to tell you to change: ? How much insulin or medicines you take. ? How much food you eat. Lifestyle  Do not use any tobacco products. These include cigarettes, chewing tobacco, and e-cigarettes. If you need help quitting, ask your doctor.  Ask your doctor how much alcohol is safe for you.  Learn to deal with stress. If you need help with this, ask your doctor. Body care   Stay up to date with your shots (immunizations).  Have your eyes and feet checked by a doctor as often as told.  Check your skin and feet every day. Check for cuts, bruises, redness, blisters, or sores.  Brush your teeth and gums two times a day. Floss one or more times a day.  Go to the dentist one or more times every 6 months.  Stay at a healthy weight. General instructions  Take over-the-counter and prescription medicines only as told by your doctor.  Share your diabetes care plan with: ? Your work or school. ? People you live with.  Carry a card or wear jewelry that says you have diabetes.  Keep all follow-up visits as told by your doctor. This is important. Questions to ask your doctor  Do I need to meet with a diabetes educator?  Where can I find a support group for people with diabetes? Where to find more information To learn more about diabetes, visit:  American Diabetes Association: www.diabetes.org  American Association of Diabetes  Educators: www.diabeteseducator.org Summary  When you have type 2 diabetes, you must make sure your blood sugar (glucose) stays in a healthy range.  Check your blood sugar every day, as often as told.  Having diabetes can raise your risk for other conditions. Your doctor may prescribe medicines to help you not have these problems.  Keep all follow-up visits as told by your doctor. This is important. This information is not intended to replace advice given to you by your health care provider. Make sure you discuss any questions you have with your health care provider. Document Revised: 09/27/2017 Document Reviewed: 05/10/2015 Elsevier Patient Education  2020 [employer]   Diabetes Mellitus and Nutrition, Adult When you have diabetes (diabetes mellitus), it is very important to have healthy eating habits because your blood sugar (glucose) levels are greatly affected by what you   eat and drink. Eating healthy foods in the appropriate amounts, at about the same times every day, can help you:  Control your blood glucose.  Lower your risk of heart disease.  Improve your blood pressure.  Reach or maintain a healthy weight. Every person with diabetes is different, and each person has different needs for a meal plan. Your health care provider may recommend that you work with a diet and nutrition specialist (dietitian) to make a meal plan that is best for you. Your meal plan may vary depending on factors such as:  The calories you need.  The medicines you take.  Your weight.  Your blood glucose, blood pressure, and cholesterol levels.  Your activity level.  Other health conditions you have, such as heart or kidney disease. How do carbohydrates affect me? Carbohydrates, also called carbs, affect your blood glucose level more than any other type of food. Eating carbs naturally raises the amount of glucose in your blood. Carb counting is a method for keeping track of how many carbs you  eat. Counting carbs is important to keep your blood glucose at a healthy level, especially if you use insulin or take certain oral diabetes medicines. It is important to know how many carbs you can safely have in each meal. This is different for every person. Your dietitian can help you calculate how many carbs you should have at each meal and for each snack. Foods that contain carbs include:  Bread, cereal, rice, pasta, and crackers.  Potatoes and corn.  Peas, beans, and lentils.  Milk and yogurt.  Fruit and juice.  Desserts, such as cakes, cookies, ice cream, and candy. How does alcohol affect me? Alcohol can cause a sudden decrease in blood glucose (hypoglycemia), especially if you use insulin or take certain oral diabetes medicines. Hypoglycemia can be a life-threatening condition. Symptoms of hypoglycemia (sleepiness, dizziness, and confusion) are similar to symptoms of having too much alcohol. If your health care provider says that alcohol is safe for you, follow these guidelines:  Limit alcohol intake to no more than 1 drink per day for nonpregnant women and 2 drinks per day for men. One drink equals 12 oz of beer, 5 oz of wine, or 1 oz of hard liquor.  Do not drink on an empty stomach.  Keep yourself hydrated with water, diet soda, or unsweetened iced tea.  Keep in mind that regular soda, juice, and other mixers may contain a lot of sugar and must be counted as carbs. What are tips for following this plan?  Reading food labels  Start by checking the serving size on the "Nutrition Facts" label of packaged foods and drinks. The amount of calories, carbs, fats, and other nutrients listed on the label is based on one serving of the item. Many items contain more than one serving per package.  Check the total grams (g) of carbs in one serving. You can calculate the number of servings of carbs in one serving by dividing the total carbs by 15. For example, if a food has 30 g of total  carbs, it would be equal to 2 servings of carbs.  Check the number of grams (g) of saturated and trans fats in one serving. Choose foods that have low or no amount of these fats.  Check the number of milligrams (mg) of salt (sodium) in one serving. Most people should limit total sodium intake to less than 2,300 mg per day.  Always check the nutrition information of foods labeled   as "low-fat" or "nonfat". These foods may be higher in added sugar or refined carbs and should be avoided.  Talk to your dietitian to identify your daily goals for nutrients listed on the label. Shopping  Avoid buying canned, premade, or processed foods. These foods tend to be high in fat, sodium, and added sugar.  Shop around the outside edge of the grocery store. This includes fresh fruits and vegetables, bulk grains, fresh meats, and fresh dairy. Cooking  Use low-heat cooking methods, such as baking, instead of high-heat cooking methods like deep frying.  Cook using healthy oils, such as olive, canola, or sunflower oil.  Avoid cooking with butter, cream, or high-fat meats. Meal planning  Eat meals and snacks regularly, preferably at the same times every day. Avoid going long periods of time without eating.  Eat foods high in fiber, such as fresh fruits, vegetables, beans, and whole grains. Talk to your dietitian about how many servings of carbs you can eat at each meal.  Eat 4-6 ounces (oz) of lean protein each day, such as lean meat, chicken, fish, eggs, or tofu. One oz of lean protein is equal to: ? 1 oz of meat, chicken, or fish. ? 1 egg. ?  cup of tofu.  Eat some foods each day that contain healthy fats, such as avocado, nuts, seeds, and fish. Lifestyle  Check your blood glucose regularly.  Exercise regularly as told by your health care provider. This may include: ? 150 minutes of moderate-intensity or vigorous-intensity exercise each week. This could be brisk walking, biking, or water  aerobics. ? Stretching and doing strength exercises, such as yoga or weightlifting, at least 2 times a week.  Take medicines as told by your health care provider.  Do not use any products that contain nicotine or tobacco, such as cigarettes and e-cigarettes. If you need help quitting, ask your health care provider.  Work with a counselor or diabetes educator to identify strategies to manage stress and any emotional and social challenges. Questions to ask a health care provider  Do I need to meet with a diabetes educator?  Do I need to meet with a dietitian?  What number can I call if I have questions?  When are the best times to check my blood glucose? Where to find more information:  American Diabetes Association: diabetes.org  Academy of Nutrition and Dietetics: www.eatright.org  National Institute of Diabetes and Digestive and Kidney Diseases (NIH): www.niddk.nih.gov Summary  A healthy meal plan will help you control your blood glucose and maintain a healthy lifestyle.  Working with a diet and nutrition specialist (dietitian) can help you make a meal plan that is best for you.  Keep in mind that carbohydrates (carbs) and alcohol have immediate effects on your blood glucose levels. It is important to count carbs and to use alcohol carefully. This information is not intended to replace advice given to you by your health care provider. Make sure you discuss any questions you have with your health care provider. Document Revised: 03/19/2017 Document Reviewed: 05/11/2016 Elsevier Patient Education  2020 [employer]   

## 2019-10-16 DIAGNOSIS — J449 Chronic obstructive pulmonary disease, unspecified: Secondary | ICD-10-CM | POA: Diagnosis not present

## 2019-10-19 ENCOUNTER — Telehealth: Payer: Self-pay | Admitting: Internal Medicine

## 2019-10-19 NOTE — Telephone Encounter (Signed)
Patient declined the Medicare Wellness Visit with [facility] - no reason given 

## 2019-11-15 DIAGNOSIS — J449 Chronic obstructive pulmonary disease, unspecified: Secondary | ICD-10-CM | POA: Diagnosis not present

## 2019-12-16 DIAGNOSIS — J449 Chronic obstructive pulmonary disease, unspecified: Secondary | ICD-10-CM | POA: Diagnosis not present

## 2020-01-16 DIAGNOSIS — J449 Chronic obstructive pulmonary disease, unspecified: Secondary | ICD-10-CM | POA: Diagnosis not present

## 2020-01-19 NOTE — Progress Notes (Signed)
Patient ID: Julie Keith, female    DOB: Apr 17, 1946, 74 y.o.   MRN: [id number]  PCP: Towanda Malkin, MD  Chief Complaint  Patient presents with  . Diabetes  . Hyperlipidemia  . Hypertension    Subjective:   Julie Keith is a 74 y.o. female, presents to clinic with CC of the following:  Chief Complaint  Patient presents with  . Diabetes  . Hyperlipidemia  . Hypertension    HPI:  Patient is a 74 year old female Last visit with me was 09/21/2019 Follows up today. She noted her husband passed away about a month ago, and she is getting things in order with the state and has been challenging.  She has been busy with this, and also has support with her daughter living next door to help. She notes her health has been okay over this time.  HTN - She does check her blood pressures at home, with a smart watch  Med regimen - enalapril 20 mg, metoprolol XL 25 mg Pt reportsgoodmed compliance  BP Readings from Last 3 Encounters:  01/23/20 132/76  09/21/19 130/80  05/11/19 138/84    Pt denies CP, increased LE edema, palpitation, increased headaches, vision changes   CKD  Lab Results  Component Value Date   CREATININE 1.14 (H) 05/16/2019   BUN 13 05/16/2019   NA 139 05/16/2019   K 5.3 05/16/2019   CL 99 05/16/2019   CO2 32 05/16/2019   Labs in Nov, 2020 - Cr - 1.49, Sept, 2020 was 1.21   Emphysema, 2L O2 via [place] - Pt denies any change to O2 demand, respiratory sx stable and uses O2 mainly at night, stated has not used inhalers in a couple years now, has nebulizers and albuterol inh to use, and does so only as needed - not needed in past year. Patient denies chronic cough, Is on O2 via [place] 2L Has not f/u'ed with pulmonary in last 3+ years   DM -not very well controlled in recent past She does check blood sugars at home - once a day, most of the time good, 116-118 in am   No very low sugars except one time had a sugar in the 40s, a few weeks  back, and drank some orange juice, otherwise notes they have been very good Med regimen - Amaryl-4 mg daily (noted previously that Metformin causes her to have headache), not want to change meds when discussed alternatives and concerns with having any low blood sugars noted.  Lab Results  Component Value Date   HGBA1C 7.8 (A) 09/21/2019   HGBA1C 7.9 (H) 05/16/2019   HGBA1C 7.6 (H) 12/27/2018   Lab Results  Component Value Date   MICROALBUR 2.4 12/27/2018   LDLCALC 85 05/16/2019   CREATININE 1.14 (H) 05/16/2019    Denies: Increased thirst, increased urination, numbness or tingling in the extremities, vision concerns   HLD: Compliant with statin -pravastatin 80 mg daily, states she take every night,  no increased myalgias   Goal is LDL less than 70      Lab Results  Component Value Date   CHOL 161 05/16/2019   HDL 55 05/16/2019   LDLCALC 85 05/16/2019   TRIG 116 05/16/2019   CHOLHDL 2.9 05/16/2019    Morbid obesity - lost a little weight, husband just passed away a month ago and struggling some with that, has support, daughter lives next store Wt Readings from Last 3 Encounters:  01/23/20 215 lb 1.6 oz (  97.6 kg)  09/21/19 222 lb (100.7 kg)  05/11/19 219 lb 3.2 oz (99.4 kg)     GERD -patient is on a PPI She takes daily and notes does control symptoms Discussed potential risks of long-term chronic use of PPIs today.  Psoriasis -  Sees Dr. Lockie Mola, next visit 02/05/20, more recently has been put on [employer] product,       Patient Active Problem List   Diagnosis Date Noted  . Stage 3 chronic kidney disease ([place]) 09/21/2019  . Psoriasis 10/10/2015  . GERD (gastroesophageal reflux disease) 10/10/2015  . Type 2 diabetes mellitus with diabetic nephropathy, without long-term current use of insulin ([place]) 10/31/2014  . Essential hypertension 10/31/2014  . Hyperlipidemia 10/31/2014  . Morbid obesity ([place]) 10/31/2014  . COPD (chronic obstructive pulmonary  disease) ([place]) 06/28/2014      Current Outpatient Medications:  .  albuterol (PROVENTIL HFA;VENTOLIN HFA) 108 (90 Base) MCG/ACT inhaler, Inhale 2 puffs into the lungs every 6 (six) hours as needed for wheezing or shortness of breath., Disp: , Rfl:  .  Apremilast 30 MG TABS, Take 30 mg by mouth 2 (two) times daily., Disp: , Rfl:  .  aspirin EC 81 MG tablet, Take 1 tablet (81 mg total) by mouth daily., Disp: 90 tablet, Rfl: 3 .  budesonide (PULMICORT) 0.5 MG/2ML nebulizer solution, Take 0.5 mg by nebulization as needed. , Disp: , Rfl:  .  enalapril (VASOTEC) 20 MG tablet, Take 1 tablet (20 mg total) by mouth 2 (two) times daily., Disp: 180 tablet, Rfl: 1 .  glimepiride (AMARYL) 4 MG tablet, Take 1 tablet (4 mg total) by mouth daily with breakfast., Disp: 90 tablet, Rfl: 1 .  metoprolol succinate (TOPROL-XL) 25 MG 24 hr tablet, Take 1 tablet (25 mg total) by mouth daily., Disp: 90 tablet, Rfl: 3 .  OneTouch Delica Lancets 55M MISC, 1 Bottle by Does not apply route in the morning, at noon, and at bedtime., Disp: 100 each, Rfl: 11 .  pantoprazole (PROTONIX) 20 MG tablet, Take 1 tablet (20 mg total) by mouth daily., Disp: 90 tablet, Rfl: 1 .  pravastatin (PRAVACHOL) 80 MG tablet, Take 1 tablet (80 mg total) by mouth daily., Disp: 90 tablet, Rfl: 1 .  Blood Glucose Monitoring Suppl (ACCU-CHEK GUIDE ME) w/Device KIT, Inject 1 Device into the skin daily. Dx:E11.9 LON:99, Disp: 1 kit, Rfl: 0 .  glucose blood (ACCU-CHEK COMPACT PLUS) test strip, USE ONE STRIP TO CHECK GLUCOSE ONCE a day if needed, Disp: 100 each, Rfl: 3 .  glucose blood (ACCU-CHEK GUIDE) test strip, Check blood sugars once daily DX:E11.9  LON:99, Disp: 100 each, Rfl: 12 .  Lancets (ACCU-CHEK MULTICLIX) lancets, DX:E11.21, LON:99, Disp: 100 each, Rfl: 12 .  triamcinolone ointment (KENALOG) 0.1 %, Apply topically as needed., Disp: , Rfl: 1   No Known Allergies   Past Surgical History:  Procedure Laterality Date  . ABDOMINAL  HYSTERECTOMY    . battery in back: astum    . CERVICAL SPINE SURGERY    . CHOLECYSTECTOMY    . MASTECTOMY Bilateral      Family History  Problem Relation Age of Onset  . Leukemia Mother   . Pneumonia Father   . COPD Father   . Diabetes Brother   . Cancer Brother        lung cancer  . Diabetes Sister      Social History   Tobacco Use  . Smoking status: Former Smoker    Packs/day: 2.00  Years: 30.00    Pack years: 60.00    Types: Cigarettes  . Smokeless tobacco: Never Used  . Tobacco comment: quit 2006  Substance Use Topics  . Alcohol use: No    Alcohol/week: 0.0 standard drinks    With staff assistance, above reviewed with the patient today.  ROS: As per HPI, otherwise no specific complaints on a limited and focused system review   No results found for this or any previous visit (from the past 72 hour(s)).   PHQ2/9: Depression screen [facility] 2/9 01/23/2020 09/21/2019 05/11/2019 03/09/2019 12/27/2018  Decreased Interest 0 0 3 0 0  Down, Depressed, Hopeless 0 0 0 0 0  PHQ - 2 Score 0 0 3 0 0  Altered sleeping - 0 0 0 0  Tired, decreased energy - 0 0 0 0  Change in appetite - 0 0 0 0  Feeling bad or failure about yourself  - 0 0 0 0  Trouble concentrating - 0 0 0 0  Moving slowly or fidgety/restless - 0 0 0 0  Suicidal thoughts - 0 0 0 0  PHQ-9 Score - 0 3 0 0  Difficult doing work/chores - Not difficult at all Not difficult at all Not difficult at all Not difficult at all   PHQ-2/9 Result is neg  Fall Risk: Fall Risk  01/23/2020 09/21/2019 05/11/2019 03/09/2019 12/27/2018  Falls in the past year? 0 0 0 0 0  Number falls in past yr: 0 0 0 0 0  Injury with Fall? 0 0 0 0 0  Follow up Falls evaluation completed - - - -      Objective:   Vitals:   01/23/20 0744  BP: 132/76  Pulse: 89  Resp: 18  Temp: 98.2 F (36.8 C)  TempSrc: Oral  SpO2: 95%  Weight: 215 lb 1.6 oz (97.6 kg)  Height: _0  (1.651 m)    Body mass index is 35.79 kg/m.  Physical Exam    NAD, masked, pleasant, wearing her oxygen this morning with her nasal cannula in place HEENT - [place]/AT, sclera anicteric, PERRL, EOMI, conj - non-inj'ed, pharynx clear Neck - supple, no adenopathy, carotids 2+ and = without bruits bilat Car - RRR without m/g/r Pulm- RR and effort normal at rest, distant breath sounds, CTA without wheeze or rales Abd - soft, obese, NT diffusely, ND,  Back - no CVA tenderness Ext - no LE edema, Neuro/psychiatric - affect was not flat, appropriate with conversation             Alert              Grossly non-focal, able to arise from a chair and ambulates with a cane for support.             Speech normal   Results for orders placed or performed in visit on 09/21/19  POCT HgB A1C  Result Value Ref Range   Hemoglobin A1C 7.8 (A) 4.0 - 5.6 %   HbA1c POC (<> result, manual entry)     HbA1c, POC (prediabetic range)     HbA1c, POC (controlled diabetic range)     Last labs reviewed    Assessment & Plan:    1. Type 2 diabetes mellitus with diabetic nephropathy, without long-term current use of insulin (HCC) Blood sugars seem to be fairly well controlled with home checks, and concerned she had one reading noted in the 40s, and quickly improved with orange juice and hydration. Her only medicine currently is  Amaryl.  I did note there are other medicines that I think would be better to be taking presently, and she notes she cannot take Metformin due to it causing headaches.  I did note other medicines that can be helpful, including a lot of the newer medicines now available in the SGLT 2 class, although she did notice significant cost concern with adding any new medicines presently, and was quite adamant and not wanting to change her medication regimen today. Ideally, would like to get her on a different medicine over time for blood sugar control We will check a BMP and A1c, and also a urine for microalbumin and she noted she needs to get to another appointment and  can not wait to have them drawn this morning, but could return over the next week or 2 to have these done.  2. Essential hypertension Blood pressure remains controlled on her current regimen Continue to monitor  3. Mixed hyperlipidemia Her last lipid panel at the end of January showed an LDL of 85, slightly above the goal of less than 70 on a fairly high dose of the pravastatin (80 mg). She may benefit from a product like Vascepa in addition, although not anxious to add further medicines to help  Emphasized the importance of dietary modifications and trying to stay more physically active to help in addition to the medicine and continue to monitor  4. Stage 3 chronic kidney disease, unspecified whether stage 3a or 3b CKD ([place]) Her kidney function improved on last check in January, will recheck today  5. Morbid obesity ([place]) Has lost a little weight in the recent past, with the events after the death of her husband a month ago contributing.  She denied major depression symptoms, has support at home with her daughter living next door.  6. Panlobular emphysema ([place]) She seems to be fairly stable utilizing her oxygen, mostly at nights.  She does not take inhalers at this time routinely, and some have been prescribed in the past to help which she has not continued.  She uses a nebulizer and albuterol as needed only, and has not needed at all in the recent past.  She does not want to follow-up with pulmonary. Do think she may benefit from use of a daily inhaler, although she was not anxious to trying to return to any of them presently.  She thinks she has good control with the intermittent nightly oxygen use presently.  7. Gastroesophageal reflux disease, unspecified whether esophagitis present Continues with a PPI.  Which she thinks is helpful.  8. Psoriasis Continues to follow with Derm, and has an appointment later this month.  I wanted to check labs today, and she was anxious to leave  quickly, and she noted she would return in the next week or 2 at the latest to have these labs done. Await those results, and tentatively schedule follow-up again in 4 months time, sooner as needed.  Flu vaccine was given today.   Towanda Malkin, MD 01/23/20 7:47 AM

## 2020-01-23 ENCOUNTER — Encounter: Payer: Self-pay | Admitting: Internal Medicine

## 2020-01-23 ENCOUNTER — Other Ambulatory Visit: Payer: Self-pay

## 2020-01-23 ENCOUNTER — Ambulatory Visit (INDEPENDENT_AMBULATORY_CARE_PROVIDER_SITE_OTHER): Payer: Medicare Other | Admitting: Internal Medicine

## 2020-01-23 VITALS — BP 132/76 | HR 89 | Temp 98.2°F | Resp 18 | Ht 65.0 in | Wt 215.1 lb

## 2020-01-23 DIAGNOSIS — K219 Gastro-esophageal reflux disease without esophagitis: Secondary | ICD-10-CM

## 2020-01-23 DIAGNOSIS — Z23 Encounter for immunization: Secondary | ICD-10-CM | POA: Diagnosis not present

## 2020-01-23 DIAGNOSIS — N183 Chronic kidney disease, stage 3 unspecified: Secondary | ICD-10-CM

## 2020-01-23 DIAGNOSIS — I10 Essential (primary) hypertension: Secondary | ICD-10-CM | POA: Diagnosis not present

## 2020-01-23 DIAGNOSIS — E1121 Type 2 diabetes mellitus with diabetic nephropathy: Secondary | ICD-10-CM

## 2020-01-23 DIAGNOSIS — E782 Mixed hyperlipidemia: Secondary | ICD-10-CM | POA: Diagnosis not present

## 2020-01-23 DIAGNOSIS — J431 Panlobular emphysema: Secondary | ICD-10-CM

## 2020-01-23 DIAGNOSIS — L409 Psoriasis, unspecified: Secondary | ICD-10-CM

## 2020-02-15 DIAGNOSIS — J449 Chronic obstructive pulmonary disease, unspecified: Secondary | ICD-10-CM | POA: Diagnosis not present

## 2020-02-28 ENCOUNTER — Encounter: Payer: Self-pay | Admitting: Internal Medicine

## 2020-02-28 ENCOUNTER — Other Ambulatory Visit: Payer: Self-pay

## 2020-02-28 ENCOUNTER — Ambulatory Visit (INDEPENDENT_AMBULATORY_CARE_PROVIDER_SITE_OTHER): Payer: Medicare Other | Admitting: Internal Medicine

## 2020-02-28 VITALS — BP 160/54 | HR 83 | Temp 98.2°F | Resp 16 | Ht 65.0 in | Wt 209.0 lb

## 2020-02-28 DIAGNOSIS — M1712 Unilateral primary osteoarthritis, left knee: Secondary | ICD-10-CM | POA: Insufficient documentation

## 2020-02-28 DIAGNOSIS — M25562 Pain in left knee: Secondary | ICD-10-CM | POA: Diagnosis not present

## 2020-02-28 MED ORDER — NAPROXEN 375 MG PO TABS: 375.0000 mg | ORAL_TABLET | Freq: Two times a day (BID) | ORAL | 1 refills | Status: DC

## 2020-02-28 NOTE — Progress Notes (Signed)
Patient ID: Julie Keith, female    DOB: Aug 24, 1945, 74 y.o.   MRN: [id number]  PCP: Delsa Grana, PA-C  Chief Complaint  Patient presents with  . Knee Pain    Left    Subjective:   Julie Keith is a 74 y.o. female, presents to clinic with CC of the following:  Chief Complaint  Patient presents with  . Knee Pain    Left    HPI:  Patient is a 74 year old female patient of Delsa Grana Last visit with me was 01/23/2020 Follows up today with left knee pain.  Patient with a history of emphysema and current oxygen use, mostly at nighttime, also with history of CKD, diabetes, hypertension, hyperlipidemia, morbid obesity, GERD and psoriasis  He noticed her knee has been bothersome on and off for the past year, and has increased some in the past couple months, more problematic in the past week.  She noted her husband passed around Labor Day, she has been handling issues with his estate, and doing a lot of work around the house that has increased her being up and about.  She denies any one-time trauma before pain increased.  She feels the pain in the front of the knee, and more on the inside. She denies major swelling of the left knee. She has been applying heat and cold, and taking ibuprofen, took 3 tablets this morning, and sometimes trying Tylenol instead to help as needed. She has been using a cane to help get around and limping due to the pain. She denies any numbness or tingling down the left leg, no calf pain or swelling, The knee is not getting stuck, not giving out per se, although is weaker and feels less stable on the knee. Discussed potential next steps, which may include an injection, and she stated she does not want to get an injection, has had one previously, and had problems after that.  Patient Active Problem List   Diagnosis Date Noted  . Stage 3 chronic kidney disease ([place]) 09/21/2019  . Psoriasis 10/10/2015  . GERD (gastroesophageal reflux disease)  10/10/2015  . Type 2 diabetes mellitus with diabetic nephropathy, without long-term current use of insulin ([place]) 10/31/2014  . Essential hypertension 10/31/2014  . Hyperlipidemia 10/31/2014  . Morbid obesity ([place]) 10/31/2014  . COPD (chronic obstructive pulmonary disease) ([place]) 06/28/2014      Current Outpatient Medications:  .  albuterol (PROVENTIL HFA;VENTOLIN HFA) 108 (90 Base) MCG/ACT inhaler, Inhale 2 puffs into the lungs every 6 (six) hours as needed for wheezing or shortness of breath., Disp: , Rfl:  .  Apremilast 30 MG TABS, Take 30 mg by mouth 2 (two) times daily., Disp: , Rfl:  .  aspirin EC 81 MG tablet, Take 1 tablet (81 mg total) by mouth daily., Disp: 90 tablet, Rfl: 3 .  budesonide (PULMICORT) 0.5 MG/2ML nebulizer solution, Take 0.5 mg by nebulization as needed. , Disp: , Rfl:  .  enalapril (VASOTEC) 20 MG tablet, Take 1 tablet (20 mg total) by mouth 2 (two) times daily., Disp: 180 tablet, Rfl: 1 .  glimepiride (AMARYL) 4 MG tablet, Take 1 tablet (4 mg total) by mouth daily with breakfast., Disp: 90 tablet, Rfl: 1 .  metoprolol succinate (TOPROL-XL) 25 MG 24 hr tablet, Take 1 tablet (25 mg total) by mouth daily., Disp: 90 tablet, Rfl: 3 .  OneTouch Delica Lancets 94R MISC, 1 Bottle by Does not apply route in the morning, at noon, and at  bedtime., Disp: 100 each, Rfl: 11 .  pantoprazole (PROTONIX) 20 MG tablet, Take 1 tablet (20 mg total) by mouth daily., Disp: 90 tablet, Rfl: 1 .  pravastatin (PRAVACHOL) 80 MG tablet, Take 1 tablet (80 mg total) by mouth daily., Disp: 90 tablet, Rfl: 1 .  Blood Glucose Monitoring Suppl (ACCU-CHEK GUIDE ME) w/Device KIT, Inject 1 Device into the skin daily. Dx:E11.9 LON:99, Disp: 1 kit, Rfl: 0 .  glucose blood (ACCU-CHEK COMPACT PLUS) test strip, USE ONE STRIP TO CHECK GLUCOSE ONCE a day if needed, Disp: 100 each, Rfl: 3 .  glucose blood (ACCU-CHEK GUIDE) test strip, Check blood sugars once daily DX:E11.9  LON:99, Disp: 100 each, Rfl: 12 .   Lancets (ACCU-CHEK MULTICLIX) lancets, DX:E11.21, LON:99, Disp: 100 each, Rfl: 12 .  naproxen (NAPROSYN) 375 MG tablet, Take 1 tablet (375 mg total) by mouth 2 (two) times daily with a meal. Take as needed, Disp: 30 tablet, Rfl: 1 .  triamcinolone ointment (KENALOG) 0.1 %, Apply topically as needed., Disp: , Rfl: 1   No Known Allergies   Past Surgical History:  Procedure Laterality Date  . ABDOMINAL HYSTERECTOMY    . battery in back: astum    . CERVICAL SPINE SURGERY    . CHOLECYSTECTOMY    . MASTECTOMY Bilateral      Family History  Problem Relation Age of Onset  . Leukemia Mother   . Pneumonia Father   . COPD Father   . Diabetes Brother   . Cancer Brother        lung cancer  . Diabetes Sister      Social History   Tobacco Use  . Smoking status: Former Smoker    Packs/day: 2.00    Years: 30.00    Pack years: 60.00    Types: Cigarettes  . Smokeless tobacco: Never Used  . Tobacco comment: quit 2006  Substance Use Topics  . Alcohol use: No    Alcohol/week: 0.0 standard drinks    With staff assistance, above reviewed with the patient today.  ROS: As per HPI, otherwise no specific complaints on a limited and focused system review   No results found for this or any previous visit (from the past 72 hour(s)).   PHQ2/9: Depression screen [facility] 2/9 02/28/2020 01/23/2020 09/21/2019 05/11/2019 03/09/2019  Decreased Interest 0 0 0 3 0  Down, Depressed, Hopeless 0 0 0 0 0  PHQ - 2 Score 0 0 0 3 0  Altered sleeping - - 0 0 0  Tired, decreased energy - - 0 0 0  Change in appetite - - 0 0 0  Feeling bad or failure about yourself  - - 0 0 0  Trouble concentrating - - 0 0 0  Moving slowly or fidgety/restless - - 0 0 0  Suicidal thoughts - - 0 0 0  PHQ-9 Score - - 0 3 0  Difficult doing work/chores - - Not difficult at all Not difficult at all Not difficult at all  Some recent data might be hidden   PHQ-2/9 Result is neg  Fall Risk: Fall Risk  02/28/2020 01/23/2020  09/21/2019 05/11/2019 03/09/2019  Falls in the past year? 0 0 0 0 0  Number falls in past yr: 0 0 0 0 0  Injury with Fall? 0 0 0 0 0  Follow up - Falls evaluation completed - - -      Objective:   Vitals:   02/28/20 0901  BP: (!) 160/54  Pulse: 83  Resp:  16  Temp: 98.2 F (36.8 C)  TempSrc: Oral  SpO2: 93%  Weight: 209 lb (94.8 kg)  Height: $Remove'5\' 5"'[id number]$  (1.651 m)    Body mass index is 34.78 kg/m.  Physical Exam   NAD, masked HEENT - /AT, sclera anicteric, Car - RRR, not tachycardic Ext - no distal LE edema, no left calf tenderness, no erythema, increased warmth of left calf examined uninjured right knee first and stable, no pain with range of motion Left knee -adequate ROM, with discomfort noted as approached the last 20-30  degrees of flexion, could fully extend with discomfort as reaching full extension.  Mild crepitus on ROM testing  No marked effusion, question a subtle effusion  No bruising  NT with movement of the patella, no gross deformity of patella  NT suprapatella bursa region  No medial or lateral joint line tenderness with palpation (and when had pain, felt it in the medial joint line region and more diffuse above and below and felt pain up towards the patella)  Increased pain when testing the medial collateral, with no big gap identified, no pain testing the lateral collateral  Lachman with some questionable laxity, although not marked,  Drawer signs negative  McMurrays testing was very painful for her when testing the medial meniscus with pain felt over that medial joint line region  No pain posterior, no obvious Baker's cyst Neuro/psychiatric - affect was not flat, appropriate with conversation  Alert with normal speech  Got up from the chair and limped over towards the exam table and was able to get up the step onto the exam table without assistance.  Uses a cane to ambulate  Results for orders placed or performed in visit on 09/21/19  POCT HgB A1C  Result  Value Ref Range   Hemoglobin A1C 7.8 (A) 4.0 - 5.6 %   HbA1c POC (<> result, manual entry)     HbA1c, POC (prediabetic range)     HbA1c, POC (controlled diabetic range)         Assessment & Plan:   1. Acute pain of left knee/chronic left knee pain history 2. Osteoarthritis of left knee, unspecified osteoarthritis type  Discussed with the patient that I do feel she has an osteoarthritis component, the degree of which is unclear, and x-rays would be helpful to further assess presently.  Concerned with a cartilage injury noted, especially on the medial side with her symptoms more acute on chronic.  Emphasized the RICE modalities in the short-term, with rest, applying cold topically (not heat) at 10 to 15-minute intervals several times a day, elevating as much as possible, and recommended a neoprene sleeve to help for support when up and about. Discussed a possible injection being included in the management at some point in time, and she did not want to pursue that presently as noted above in HPI Discussed other potential next steps, and the potential of entities like Synvisc or other related products are sometimes entertained to help longer-term. Discussed potential orthopedic involvement, and she wanted to proceed with that and a referral was provided. Hold off on ordering the x-rays today as will be done with the orthopedic follow-up. Will add a naproxen product-375 mg twice daily with food as needed in the short-term, and not to take ibuprofen with this or other NSAIDs.  Noted concerns with more chronic NSAID use over time, and would like to avoid that, and she is on a PPI for GERD, and to continue that while taking the NSAID important.  Can take the Tylenol products as needed with the naproxen  - naproxen (NAPROSYN) 375 MG tablet; Take 1 tablet (375 mg total) by mouth 2 (two) times daily with a meal. Take as needed  Dispense: 30 tablet; Refill: 1 - Ambulatory referral to  Orthopedics    Await orthopedic input, and can follow-up here as needed over time.    Towanda Malkin, MD 02/28/20 9:31 AM

## 2020-02-28 NOTE — Patient Instructions (Signed)
A medicine was prescribed to the pharmacy to help, can take twice a day and take with food as needed.  Do not take other ibuprofen products when taking this medicine, can use Tylenol as needed in addition.  Also important to elevate as much as possible, apply ice topically at 10 to 15-minute intervals several times a day, and rest.  Can obtain a neoprene sleeve to wear when up and about for support.  Referral was placed to orthopedics today as well

## 2020-03-08 DIAGNOSIS — M1712 Unilateral primary osteoarthritis, left knee: Secondary | ICD-10-CM | POA: Diagnosis not present

## 2020-03-09 DIAGNOSIS — M179 Osteoarthritis of knee, unspecified: Secondary | ICD-10-CM | POA: Insufficient documentation

## 2020-03-09 DIAGNOSIS — M1710 Unilateral primary osteoarthritis, unspecified knee: Secondary | ICD-10-CM | POA: Insufficient documentation

## 2020-03-17 DIAGNOSIS — J449 Chronic obstructive pulmonary disease, unspecified: Secondary | ICD-10-CM | POA: Diagnosis not present

## 2020-03-19 DIAGNOSIS — M1712 Unilateral primary osteoarthritis, left knee: Secondary | ICD-10-CM | POA: Diagnosis not present

## 2020-04-15 ENCOUNTER — Other Ambulatory Visit: Payer: Self-pay

## 2020-04-15 ENCOUNTER — Encounter: Payer: Self-pay | Admitting: Family Medicine

## 2020-04-15 ENCOUNTER — Ambulatory Visit: Payer: Medicare Other | Admitting: Family Medicine

## 2020-04-15 VITALS — BP 136/78 | HR 78 | Temp 98.0°F | Resp 16 | Ht 65.0 in | Wt 205.2 lb

## 2020-04-15 DIAGNOSIS — R002 Palpitations: Secondary | ICD-10-CM | POA: Diagnosis not present

## 2020-04-15 DIAGNOSIS — K219 Gastro-esophageal reflux disease without esophagitis: Secondary | ICD-10-CM | POA: Diagnosis not present

## 2020-04-15 DIAGNOSIS — Z5181 Encounter for therapeutic drug level monitoring: Secondary | ICD-10-CM

## 2020-04-15 DIAGNOSIS — E119 Type 2 diabetes mellitus without complications: Secondary | ICD-10-CM | POA: Diagnosis not present

## 2020-04-15 DIAGNOSIS — I10 Essential (primary) hypertension: Secondary | ICD-10-CM

## 2020-04-15 DIAGNOSIS — J431 Panlobular emphysema: Secondary | ICD-10-CM

## 2020-04-15 DIAGNOSIS — E782 Mixed hyperlipidemia: Secondary | ICD-10-CM

## 2020-04-15 DIAGNOSIS — M1712 Unilateral primary osteoarthritis, left knee: Secondary | ICD-10-CM

## 2020-04-15 MED ORDER — PANTOPRAZOLE SODIUM 20 MG PO TBEC: 20.0000 mg | DELAYED_RELEASE_TABLET | Freq: Every day | ORAL | 1 refills | Status: DC

## 2020-04-15 MED ORDER — GLIMEPIRIDE 4 MG PO TABS: 4.0000 mg | ORAL_TABLET | Freq: Every day | ORAL | 1 refills | Status: DC

## 2020-04-15 MED ORDER — METOPROLOL SUCCINATE ER 25 MG PO TB24: 25.0000 mg | ORAL_TABLET | Freq: Every day | ORAL | 3 refills | Status: DC

## 2020-04-15 MED ORDER — ENALAPRIL MALEATE 20 MG PO TABS: 20.0000 mg | ORAL_TABLET | Freq: Two times a day (BID) | ORAL | 1 refills | Status: DC

## 2020-04-15 MED ORDER — PRAVASTATIN SODIUM 80 MG PO TABS: 80.0000 mg | ORAL_TABLET | Freq: Every day | ORAL | 1 refills | Status: DC

## 2020-04-15 NOTE — Progress Notes (Signed)
Name: Julie Keith   MRN: [id number]    DOB: [birth year]   Date:04/15/2020       Progress Note  Chief Complaint  Patient presents with  . Follow-up     Subjective:   Julie Keith is a 74 y.o. female, presents to clinic for routine f/up  Left knee pain, OA, seeing Dr. Odis Luster, scheduled for TKR-she has questions about surgery and anesthesia, is scheduled for this at the end of January, she moved up her routine appointment to today and wants to get all of her medications refilled and everything checked to make sure she is well for surgery.  She has been losing weight and exercising.  She does have constant left knee pain with swelling but she is able to be more active and walk more now she reports being able to do more chores in her home with less exertional shortness of breath she is walking about 5000 steps a day.  Her husband recently passed away and this is kept her very busy with cleaning her home and doing things that have kept her more active than what she was able to do previously.  She does have good family support including her son who lives nearby and she was able to spend Christmas with her family and grandchildren, she does feel lonely and is dealing with all of finances and bills which has been stressful for her she is also downsizing her home.  Hypertension:  Currently managed on enalapril 20 mg twice daily and metoprolol 25 mg daily Pt reports good med compliance and denies any SE.   Blood pressure today is well controlled. BP Readings from Last 3 Encounters:  04/15/20 136/78  02/28/20 (!) 160/54  01/23/20 132/76   Pt denies CP, SOB, exertional sx, LE edema, palpitation, Ha's, visual disturbances, lightheadedness, hypotension, syncope. Dietary efforts for BP?  Eating less, more active  Hyperlipidemia: Currently treated with pravastatin 80 mg, pt reports good med compliance (should be out of meds, but she declined running out)  Last Lipids: Lab Results  Component  Value Date   CHOL 161 05/16/2019   HDL 55 05/16/2019   LDLCALC 85 05/16/2019   TRIG 116 05/16/2019   CHOLHDL 2.9 05/16/2019   - Denies: Chest pain, shortness of breath, myalgias, claudication  DM:   Pt managing DM with glimepiride 4 mg daily Reports good med compliance Pt has no SE from meds. Blood sugars - 120's sometimes she has low blood sugar in the 60s if she skips meals Denies: Polyuria, polydipsia, vision changes, neuropathy Recent pertinent labs: Lab Results  Component Value Date   HGBA1C 7.8 (A) 09/21/2019   HGBA1C 7.9 (H) 05/16/2019   HGBA1C 7.6 (H) 12/27/2018   Standard of care and health maintenance: Foot exam:  Due - done today DM eye exam: Up-to-date due in February ACEI/ARB: Yes Statin: Yes  Emphysema-she is lost weight and is more active, has less exertional shortness of breath, continues to use her oxygen only while sleeping, has budesonide and albuterol inhalers but does not use regularly and has not used recently  GERD- on meds daily, cannot go without  Health Maintenance  Topic Date Due  . DEXA SCAN  Never done  . HEMOGLOBIN A1C  03/22/2020  . TETANUS/TDAP  04/15/2021 (Originally 03/31/1965)  . Hepatitis C Screening  04/15/2021 (Originally February 29, 1946)  . OPHTHALMOLOGY EXAM  05/28/2020  . COVID-19 Vaccine (3 - Booster for Pfizer series) 10/09/2020  . FOOT EXAM  04/15/2021  . INFLUENZA VACCINE  Completed  . PNA vac Low Risk Adult  Completed  . MAMMOGRAM  Discontinued        Current Outpatient Medications:  .  albuterol (PROVENTIL HFA;VENTOLIN HFA) 108 (90 Base) MCG/ACT inhaler, Inhale 2 puffs into the lungs every 6 (six) hours as needed for wheezing or shortness of breath., Disp: , Rfl:  .  aspirin EC 81 MG tablet, Take 1 tablet (81 mg total) by mouth daily., Disp: 90 tablet, Rfl: 3 .  budesonide (PULMICORT) 0.5 MG/2ML nebulizer solution, Take 0.5 mg by nebulization as needed. , Disp: , Rfl:  .  enalapril (VASOTEC) 20 MG tablet, Take 1 tablet  (20 mg total) by mouth 2 (two) times daily., Disp: 180 tablet, Rfl: 1 .  glimepiride (AMARYL) 4 MG tablet, Take 1 tablet (4 mg total) by mouth daily with breakfast., Disp: 90 tablet, Rfl: 1 .  metoprolol succinate (TOPROL-XL) 25 MG 24 hr tablet, Take 1 tablet (25 mg total) by mouth daily., Disp: 90 tablet, Rfl: 3 .  OneTouch Delica Lancets 33G MISC, 1 Bottle by Does not apply route in the morning, at noon, and at bedtime., Disp: 100 each, Rfl: 11 .  pantoprazole (PROTONIX) 20 MG tablet, Take 1 tablet (20 mg total) by mouth daily., Disp: 90 tablet, Rfl: 1 .  pravastatin (PRAVACHOL) 80 MG tablet, Take 1 tablet (80 mg total) by mouth daily., Disp: 90 tablet, Rfl: 1 .  Apremilast 30 MG TABS, Take 30 mg by mouth 2 (two) times daily. (Patient not taking: Reported on 04/15/2020), Disp: , Rfl:  .  naproxen (NAPROSYN) 375 MG tablet, Take 1 tablet (375 mg total) by mouth 2 (two) times daily with a meal. Take as needed (Patient not taking: Reported on 04/15/2020), Disp: 30 tablet, Rfl: 1  Patient Active Problem List   Diagnosis Date Noted  . Osteoarthritis of left knee 02/28/2020  . Stage 3 chronic kidney disease (HCC) 09/21/2019  . Psoriasis 10/10/2015  . GERD (gastroesophageal reflux disease) 10/10/2015  . Type 2 diabetes mellitus with diabetic nephropathy, without long-term current use of insulin (HCC) 10/31/2014  . Essential hypertension 10/31/2014  . Hyperlipidemia 10/31/2014  . Morbid obesity (HCC) 10/31/2014  . COPD (chronic obstructive pulmonary disease) (HCC) 06/28/2014    Past Surgical History:  Procedure Laterality Date  . ABDOMINAL HYSTERECTOMY    . battery in back: astum    . CERVICAL SPINE SURGERY    . CHOLECYSTECTOMY    . MASTECTOMY Bilateral     Family History  Problem Relation Age of Onset  . Leukemia Mother   . Pneumonia Father   . COPD Father   . Diabetes Brother   . Cancer Brother        lung cancer  . Diabetes Sister     Social History   Tobacco Use  . Smoking  status: Former Smoker    Packs/day: 2.00    Years: 30.00    Pack years: 60.00    Types: Cigarettes  . Smokeless tobacco: Never Used  . Tobacco comment: quit 2006  Vaping Use  . Vaping Use: Never used  Substance Use Topics  . Alcohol use: No    Alcohol/week: 0.0 standard drinks  . Drug use: No     No Known Allergies  Health Maintenance  Topic Date Due  . Hepatitis C Screening  Never done  . DEXA SCAN  Never done  . FOOT EXAM  12/27/2019  . HEMOGLOBIN A1C  03/22/2020  . TETANUS/TDAP  04/15/2021 (Originally [birth year]31[birth year]1963)  . OPHTHALMOLOGY  EXAM  05/28/2020  . COVID-19 Vaccine (3 - Booster for Pfizer series) 10/09/2020  . INFLUENZA VACCINE  Completed  . PNA vac Low Risk Adult  Completed  . MAMMOGRAM  Discontinued    Chart Review Today: I personally reviewed active problem list, medication list, allergies, family history, social history, health maintenance, notes from last encounter, lab results, imaging with the patient/caregiver today.   Review of Systems  10 Systems reviewed and are negative for acute change except as noted in the HPI.  Objective:   Vitals:   04/15/20 1038  BP: 136/78  Pulse: 78  Resp: 16  Temp: 98 F (36.7 C)  SpO2: 98%  Weight: 205 lb 3.2 oz (93.1 kg)  Height: 5\' 5"  (1.651 m)    Body mass index is 34.15 kg/m.  Physical Exam Vitals and nursing note reviewed.  Constitutional:      General: She is not in acute distress.    Appearance: Normal appearance. She is well-developed. She is obese. She is not ill-appearing, toxic-appearing or diaphoretic.     Interventions: Face mask in place.  HENT:     Head: Normocephalic and atraumatic.     Right Ear: External ear normal.     Left Ear: External ear normal.  Eyes:     General: Lids are normal. No scleral icterus.       Right eye: No discharge.        Left eye: No discharge.     Conjunctiva/sclera: Conjunctivae normal.  Neck:     Trachea: Phonation normal. No tracheal deviation.   Cardiovascular:     Rate and Rhythm: Normal rate and regular rhythm.     Pulses: Normal pulses.          Radial pulses are 2+ on the right side and 2+ on the left side.       Posterior tibial pulses are 2+ on the right side and 2+ on the left side.     Heart sounds: Normal heart sounds. No murmur heard. No friction rub. No gallop.   Pulmonary:     Effort: Pulmonary effort is normal. No respiratory distress.     Breath sounds: Normal breath sounds. No stridor. No wheezing, rhonchi or rales.  Chest:     Chest wall: No tenderness.  Abdominal:     General: Bowel sounds are normal. There is no distension.     Palpations: Abdomen is soft.     Tenderness: There is no abdominal tenderness.  Musculoskeletal:        General: Swelling (left knee - chronic) present.     Right lower leg: No edema.     Left lower leg: No edema.  Skin:    General: Skin is warm and dry.     Coloration: Skin is not jaundiced or pale.     Findings: No rash.  Neurological:     Mental Status: She is alert.     Motor: No abnormal muscle tone.     Gait: Gait abnormal.  Psychiatric:        Mood and Affect: Mood normal.        Speech: Speech normal.        Behavior: Behavior normal.      Diabetic Foot Exam - Simple   Simple Foot Form Diabetic Foot exam was performed with the following findings: Yes 04/15/2020 11:10 AM  Visual Inspection Sensation Testing Pulse Check Comments      Assessment & Plan:     ICD-10-CM  1. Essential hypertension  I10 enalapril (VASOTEC) 20 MG tablet    metoprolol succinate (TOPROL-XL) 25 MG 24 hr tablet    COMPLETE METABOLIC PANEL WITH GFR   stable, well controlled, BP at goal today - continue same meds/dosing, no SE or concerns per pt, working on diet/lifestyle, increasing activity  2. Type 2 diabetes mellitus without complication, without long-term current use of insulin (HCC)  E11.9 glimepiride (AMARYL) 4 MG tablet    COMPLETE METABOLIC PANEL WITH GFR    Hemoglobin A1c     Microalbumin, urine   uncontrolled in the past, on amaryl 4 mg daily with some episodes of hypoglycemia - discussed diet/dosing/risk, hold or reduce meds if lows/not eating  3. Palpitation  R00.2 metoprolol succinate (TOPROL-XL) 25 MG 24 hr tablet    COMPLETE METABOLIC PANEL WITH GFR   well controlled on BB w/o concerning SE, stable  4. Gastroesophageal reflux disease, unspecified whether esophagitis present  K21.9 pantoprazole (PROTONIX) 20 MG tablet    COMPLETE METABOLIC PANEL WITH GFR   med refill, pt denies any new sx or concerns, PPI long term risk/SE reviewed, pt cannot go without  5. Mixed hyperlipidemia  E78.2 pravastatin (PRAVACHOL) 80 MG tablet    COMPLETE METABOLIC PANEL WITH GFR    Lipid panel   Patient endorses compliance with medications no myalgias side effects or concerns, due for repeat lipid panel in the next month  6. Osteoarthritis of left knee, unspecified osteoarthritis type  M17.12    Per specialist scheduled for total knee replacement in about a month  7. Panlobular emphysema (HCC)  J43.1    Respiratory symptoms have been improving with some weight loss and increase activity continues to use oxygen overnight, no recent flares or exacerbation  8. Encounter for medication monitoring  Z51.81 CBC with Differential/Platelet    COMPLETE METABOLIC PANEL WITH GFR    Lipid panel    Hemoglobin A1c    Microalbumin, urine      Return in about 6 months (around 10/14/2020) for Routine follow-up.   Danelle Berry, PA-C 04/15/20 11:02 AM

## 2020-04-16 ENCOUNTER — Telehealth: Payer: Self-pay

## 2020-04-16 DIAGNOSIS — J449 Chronic obstructive pulmonary disease, unspecified: Secondary | ICD-10-CM | POA: Diagnosis not present

## 2020-04-16 LAB — COMPLETE METABOLIC PANEL WITH GFR
AG Ratio: 1.4 (calc) (ref 1.0–2.5)
ALT: 12 U/L (ref 6–29)
AST: 14 U/L (ref 10–35)
Albumin: 4.2 g/dL (ref 3.6–5.1)
Alkaline phosphatase (APISO): 87 U/L (ref 37–153)
BUN/Creatinine Ratio: 12 (calc) (ref 6–22)
BUN: 14 mg/dL (ref 7–25)
CO2: 30 mmol/L (ref 20–32)
Calcium: 9.4 mg/dL (ref 8.6–10.4)
Chloride: 100 mmol/L (ref 98–110)
Creat: 1.13 mg/dL — ABNORMAL HIGH (ref 0.60–0.93)
GFR, Est African American: 55 mL/min/{1.73_m2} — ABNORMAL LOW (ref >=60)
GFR, Est Non African American: 47 mL/min/{1.73_m2} — ABNORMAL LOW (ref >=60)
Globulin: 3.1 g/dL (calc) (ref 1.9–3.7)
Glucose, Bld: 126 mg/dL — ABNORMAL HIGH (ref 65–99)
Potassium: 5.2 mmol/L (ref 3.5–5.3)
Sodium: 137 mmol/L (ref 135–146)
Total Bilirubin: 0.4 mg/dL (ref 0.2–1.2)
Total Protein: 7.3 g/dL (ref 6.1–8.1)

## 2020-04-16 LAB — CBC WITH DIFFERENTIAL/PLATELET
Absolute Monocytes: 651 cells/uL (ref 200–950)
Basophils Absolute: 53 cells/uL (ref 0–200)
Basophils Relative: 0.5 %
Eosinophils Absolute: 95 cells/uL (ref 15–500)
Eosinophils Relative: 0.9 %
HCT: 41.6 % (ref 35.0–45.0)
Hemoglobin: 13.5 g/dL (ref 11.7–15.5)
Lymphs Abs: 2363 cells/uL (ref 850–3900)
MCH: 27.8 pg (ref 27.0–33.0)
MCHC: 32.5 g/dL (ref 32.0–36.0)
MCV: 85.8 fL (ref 80.0–100.0)
MPV: 9.5 fL (ref 7.5–12.5)
Monocytes Relative: 6.2 %
Neutro Abs: 7340 cells/uL (ref 1500–7800)
Neutrophils Relative %: 69.9 %
Platelets: 441 10*3/uL — ABNORMAL HIGH (ref 140–400)
RBC: 4.85 10*6/uL (ref 3.80–5.10)
RDW: 13.7 % (ref 11.0–15.0)
Total Lymphocyte: 22.5 %
WBC: 10.5 10*3/uL (ref 3.8–10.8)

## 2020-04-16 LAB — MICROALBUMIN, URINE: Microalb, Ur: 2.1 mg/dL

## 2020-04-16 LAB — LIPID PANEL
Cholesterol: 201 mg/dL — ABNORMAL HIGH (ref <200)
HDL: 54 mg/dL (ref >=50)
LDL Cholesterol (Calc): 125 mg/dL (calc) — ABNORMAL HIGH
Non-HDL Cholesterol (Calc): 147 mg/dL (calc) — ABNORMAL HIGH (ref <130)
Total CHOL/HDL Ratio: 3.7 (calc) (ref <5.0)
Triglycerides: 117 mg/dL (ref <150)

## 2020-04-16 LAB — HEMOGLOBIN A1C
Hgb A1c MFr Bld: 7.2 % of total Hgb — ABNORMAL HIGH (ref <5.7)
Mean Plasma Glucose: 160 mg/dL
eAG (mmol/L): 8.9 mmol/L

## 2020-04-16 NOTE — Telephone Encounter (Signed)
Copied from CRM [id number]. Topic: General - Other >> Apr 01, 2020 12:53 PM Gwenlyn Fudge wrote: Reason for CRM: Jorge Ny, from [facility], calling and is requesting to have an update on a medical clearance for pt faxed on 03/19/20. She states that she will send that over today.  Please advise. >> Apr 16, 2020 11:28 AM Darron Doom wrote: Jorge Ny with [facility] called in requesting an update on the surgical clearance form faxed over a few weeks ago. Patient is scheduled for her procedure on 05/13/20 and form is needed back ASAP please any questions call Lyndsey at Ph# [phone] ext# [phone] and Fax# [phone] >> Apr 08, 2020  2:08 PM Riesa Pope, RMA wrote: [facility] office, Have not seen a medical clearance form

## 2020-04-17 NOTE — Telephone Encounter (Signed)
Called Lynsey at  [phone] and left voice message that we did not receive paperwork. Please fax again

## 2020-05-03 ENCOUNTER — Encounter: Payer: Self-pay | Admitting: Urgent Care

## 2020-05-03 ENCOUNTER — Other Ambulatory Visit: Payer: Self-pay | Admitting: Orthopedic Surgery

## 2020-05-03 ENCOUNTER — Encounter: Payer: Self-pay | Admitting: Orthopedic Surgery

## 2020-05-03 ENCOUNTER — Other Ambulatory Visit: Payer: Self-pay

## 2020-05-03 ENCOUNTER — Encounter
Admission: RE | Admit: 2020-05-03 | Discharge: 2020-05-03 | Disposition: A | Discharge status: Home / Self Care | Payer: Medicare Other | Source: Ambulatory Visit | Attending: Orthopedic Surgery | Admitting: Orthopedic Surgery

## 2020-05-03 DIAGNOSIS — I10 Essential (primary) hypertension: Secondary | ICD-10-CM | POA: Insufficient documentation

## 2020-05-03 DIAGNOSIS — Z01818 Encounter for other preprocedural examination: Secondary | ICD-10-CM | POA: Diagnosis not present

## 2020-05-03 HISTORY — DX: Dyspnea, unspecified: R06.00

## 2020-05-03 LAB — TYPE AND SCREEN
ABO/RH(D): AB POS
Antibody Screen: NEGATIVE

## 2020-05-03 LAB — SURGICAL PCR SCREEN
MRSA, PCR: NEGATIVE
Staphylococcus aureus: POSITIVE — AB

## 2020-05-03 NOTE — Patient Instructions (Addendum)
Your procedure is scheduled on: Monday Jun 17, 2020 Report to Day Surgery inside [facility] 2nd floor (stop by Registration desk on first floor before going upstairs). To find out your arrival time please call [phone] between 1PM - 3PM on Friday Jun 14, 2020.  Remember: Instructions that are not followed completely may result in serious medical risk,  up to and including death, or upon the discretion of your surgeon and anesthesiologist your  surgery may need to be rescheduled.     _X__ 1. Do not eat food after midnight the night before your procedure.                 No chewing gum or hard candies. You may drink clear liquids up to 2 hours                 before you are scheduled to arrive for your surgery- DO not drink clear                 liquids within 2 hours of the start of your surgery.                 Clear Liquids include:  water, apple juice without pulp, clear Gatorade, G2 or                  Gatorade Zero (avoid Red/Purple/Blue), Black Coffee or Tea (Do not add                 anything to coffee or tea).  __X__2.  On the morning of surgery brush your teeth with toothpaste and water, you                may rinse your mouth with mouthwash if you wish.  Do not swallow any toothpaste of mouthwash.     _X__ 3.  No Alcohol for 24 hours before or after surgery.   _X__ 4.  Do Not Smoke or use e-cigarettes For 24 Hours Prior to Your Surgery.                 Do not use any chewable tobacco products for at least 6 hours prior to                 Surgery.  _X__  5.  Do not use any recreational drugs (marijuana, cocaine, heroin, ecstasy, MDMA or other)                For at least one week prior to your surgery.  Combination of these drugs with anesthesia                May have life threatening results.  __X__ 6.  Notify your doctor if there is any change in your medical condition      (cold, fever, infections).     Do not wear jewelry, make-up,  hairpins, clips or nail polish. Do not wear lotions, powders, or perfumes. You may wear deodorant. Do not shave 48 hours prior to surgery. Men may shave face and neck. Do not bring valuables to the hospital.    [facility] is not responsible for any belongings or valuables.  Contacts, dentures or bridgework may not be worn into surgery. Leave your suitcase in the car. After surgery it may be brought to your room. For patients admitted to the hospital, discharge time is determined by your treatment team.   Patients discharged the day of surgery will not be allowed to  drive home.   Make arrangements for someone to be with you for the first 24 hours of your Same Day Discharge.   __X__ Take these medicines the morning of surgery with A SIP OF WATER:    1. metoprolol succinate (TOPROL-XL) 25 MG   2. pantoprazole (PROTONIX) 20 MG  3. traMADol (ULTRAM) 50 MG   __X__ Use CHG Soap (or wipes) as directed  ____ Use Benzoyl Peroxide Gel as instructed  __X__ Use inhalers on the day of surgery  albuterol (PROVENTIL HFA;VENTOLIN HFA) 108 (90 Base) MCG/ACT inhaler  ____ Stop metformin 2 days prior to surgery    ____ Take 1/2 of usual insulin dose the night before surgery. No insulin the morning          of surgery.   __X__ Stop aspirin as instructed by your provider.  __X__ Stop Anti-inflammatories such as Ibuprofen, Aleve, Advil, naproxen and or BC powders.   __X__ Stop supplements until after surgery.    __X__ Do not start any herbal supplements before your procedure.    If you have any questions regarding your pre-procedure instructions,  Please call Pre-admit Testing at [phone].

## 2020-05-03 NOTE — H&P (Deleted)
  The note originally documented on this encounter has been moved the the encounter in which it belongs.  

## 2020-05-03 NOTE — H&P (Signed)
Julie Keith MRN:  [id number] DOB/SEX:  October 27, 1945/female  CHIEF COMPLAINT:  Painful left Knee  HISTORY: Patient is a 75 y.o. female presented with a history of pain in the left knee. Onset of symptoms was gradual starting several years ago with gradually worsening course since that time. Prior procedures on the knee include none. Patient has been treated conservatively with over-the-counter NSAIDs and activity modification. Patient currently rates pain in the knee at 10 out of 10 with activity. There is pain at night.  PAST MEDICAL HISTORY: Patient Active Problem List   Diagnosis Date Noted  . Osteoarthritis of left knee 02/28/2020  . Stage 3 chronic kidney disease (HCC) 09/21/2019  . Psoriasis 10/10/2015  . GERD (gastroesophageal reflux disease) 10/10/2015  . Type 2 diabetes mellitus with diabetic nephropathy, without long-term current use of insulin (HCC) 10/31/2014  . Essential hypertension 10/31/2014  . Hyperlipidemia 10/31/2014  . Morbid obesity (HCC) 10/31/2014  . COPD (chronic obstructive pulmonary disease) (HCC) 06/28/2014   Past Medical History:  Diagnosis Date  . Allergic rhinitis   . Diabetes (HCC)   . Emphysema lung (HCC)   . GERD (gastroesophageal reflux disease) 10/10/2015  . Hx of tobacco use, presenting hazards to health 04/08/2016  . Hypercholesteremia   . Hypertension   . Psoriasis 10/10/2015   Past Surgical History:  Procedure Laterality Date  . ABDOMINAL HYSTERECTOMY    . battery in back: astum    . CERVICAL SPINE SURGERY    . CHOLECYSTECTOMY    . MASTECTOMY Bilateral      MEDICATIONS:  (Not in a hospital admission)   ALLERGIES:  No Known Allergies  REVIEW OF SYSTEMS:  Pertinent items noted in HPI and remainder of comprehensive ROS otherwise negative.   FAMILY HISTORY:   Family History  Problem Relation Age of Onset  . Leukemia Mother   . Pneumonia Father   . COPD Father   . Diabetes Brother   . Cancer Brother        lung cancer  .  Diabetes Sister     SOCIAL HISTORY:   Social History   Tobacco Use  . Smoking status: Former Smoker    Packs/day: 2.00    Years: 30.00    Pack years: 60.00    Types: Cigarettes  . Smokeless tobacco: Never Used  . Tobacco comment: quit 2006  Substance Use Topics  . Alcohol use: No    Alcohol/week: 0.0 standard drinks     EXAMINATION:  Vital signs in last 24 hours: @VSRANGES @  General appearance: alert, cooperative and no distress Neck: no JVD and supple, symmetrical, trachea midline Lungs: clear to auscultation bilaterally Heart: regular rate and rhythm, S1, S2 normal, no murmur, click, rub or gallop Abdomen: soft, non-tender; bowel sounds normal; no masses,  no organomegaly Extremities: extremities normal, atraumatic, no cyanosis or edema and Homans sign is negative, no sign of DVT Pulses: 2+ and symmetric Skin: Skin color, texture, turgor normal. No rashes or lesions or normal  Musculoskeletal:  ROM 0-100, Ligaments intact,  Imaging Review Plain radiographs demonstrate severe degenerative joint disease of the left knee. The overall alignment is significant varus. The bone quality appears to be good for age and reported activity level.  Assessment/Plan: Primary osteoarthritis, left knee   The patient history, physical examination and imaging studies are consistent with advanced degenerative joint disease of the left knee. The patient has failed conservative treatment.  The clearance notes were reviewed.  After discussion with the patient it was felt that Total  Knee Replacement was indicated. The procedure,  risks, and benefits of total knee arthroplasty were presented and reviewed. The risks including but not limited to aseptic loosening, infection, blood clots, vascular injury, stiffness, patella tracking problems complications among others were discussed. The patient acknowledged the explanation, agreed to proceed with the plan.  [name] 05/03/2020, 8:20 AM

## 2020-05-07 DIAGNOSIS — M1712 Unilateral primary osteoarthritis, left knee: Secondary | ICD-10-CM | POA: Diagnosis not present

## 2020-05-09 ENCOUNTER — Other Ambulatory Visit: Admission: RE | Admit: 2020-05-09 | Payer: Medicare Other | Source: Ambulatory Visit

## 2020-05-09 ENCOUNTER — Other Ambulatory Visit: Payer: Medicare Other

## 2020-05-13 ENCOUNTER — Ambulatory Visit: Admission: RE | Admit: 2020-05-13 | Payer: Medicare Other | Source: Home / Self Care | Admitting: Orthopedic Surgery

## 2020-05-13 ENCOUNTER — Encounter: Admission: RE | Payer: Self-pay | Source: Home / Self Care

## 2020-05-13 SURGERY — ARTHROPLASTY, KNEE, TOTAL
Anesthesia: Spinal | Site: Knee | Laterality: Left

## 2020-05-17 ENCOUNTER — Ambulatory Visit: Payer: Medicare Other | Admitting: Internal Medicine

## 2020-05-17 ENCOUNTER — Ambulatory Visit: Payer: Medicare Other | Admitting: Family Medicine

## 2020-05-17 DIAGNOSIS — J449 Chronic obstructive pulmonary disease, unspecified: Secondary | ICD-10-CM | POA: Diagnosis not present

## 2020-06-10 ENCOUNTER — Other Ambulatory Visit: Payer: Self-pay | Admitting: Orthopedic Surgery

## 2020-06-11 NOTE — Addendum Note (Signed)
Encounter addended by: Biagio Quint, RN on: 06/11/2020 10:01 AM  Actions taken: Clinical Note Signed

## 2020-06-12 ENCOUNTER — Other Ambulatory Visit: Payer: Self-pay

## 2020-06-12 ENCOUNTER — Encounter
Admission: RE | Admit: 2020-06-12 | Discharge: 2020-06-12 | Disposition: A | Discharge status: Home / Self Care | Payer: Medicare Other | Source: Ambulatory Visit | Attending: Orthopedic Surgery | Admitting: Orthopedic Surgery

## 2020-06-12 DIAGNOSIS — Z01812 Encounter for preprocedural laboratory examination: Secondary | ICD-10-CM | POA: Insufficient documentation

## 2020-06-12 DIAGNOSIS — Z20822 Contact with and (suspected) exposure to covid-19: Secondary | ICD-10-CM | POA: Insufficient documentation

## 2020-06-12 LAB — URINALYSIS, ROUTINE W REFLEX MICROSCOPIC
Bilirubin Urine: NEGATIVE
Glucose, UA: NEGATIVE mg/dL
Ketones, ur: NEGATIVE mg/dL
Leukocytes,Ua: NEGATIVE
Nitrite: NEGATIVE
Protein, ur: 30 mg/dL — AB
Specific Gravity, Urine: 1.023 (ref 1.005–1.030)
pH: 5 (ref 5.0–8.0)

## 2020-06-12 LAB — TYPE AND SCREEN
ABO/RH(D): AB POS
Antibody Screen: NEGATIVE

## 2020-06-12 LAB — SARS CORONAVIRUS 2 (TAT 6-24 HRS): SARS Coronavirus 2: NEGATIVE

## 2020-06-12 LAB — PROTIME-INR
INR: 1 (ref 0.8–1.2)
Prothrombin Time: 12.9 seconds (ref 11.4–15.2)

## 2020-06-12 LAB — APTT: aPTT: 29 seconds (ref 24–36)

## 2020-06-13 ENCOUNTER — Other Ambulatory Visit: Payer: Medicare Other

## 2020-06-14 LAB — URINE CULTURE

## 2020-06-17 ENCOUNTER — Encounter
Admission: RE | Disposition: A | Discharge status: Home / Self Care | Payer: Self-pay | Source: Home / Self Care | Attending: Orthopedic Surgery

## 2020-06-17 ENCOUNTER — Observation Stay
Admission: RE | Admit: 2020-06-17 | Discharge: 2020-06-18 | Disposition: A | Discharge status: Home / Self Care | Payer: Medicare Other | Attending: Orthopedic Surgery | Admitting: Orthopedic Surgery

## 2020-06-17 ENCOUNTER — Ambulatory Visit: Payer: Medicare Other

## 2020-06-17 ENCOUNTER — Other Ambulatory Visit: Payer: Self-pay

## 2020-06-17 ENCOUNTER — Encounter: Payer: Self-pay | Admitting: Orthopedic Surgery

## 2020-06-17 ENCOUNTER — Ambulatory Visit: Payer: Medicare Other | Admitting: Urgent Care

## 2020-06-17 DIAGNOSIS — Z96642 Presence of left artificial hip joint: Secondary | ICD-10-CM | POA: Diagnosis not present

## 2020-06-17 DIAGNOSIS — E1122 Type 2 diabetes mellitus with diabetic chronic kidney disease: Secondary | ICD-10-CM | POA: Diagnosis not present

## 2020-06-17 DIAGNOSIS — E119 Type 2 diabetes mellitus without complications: Secondary | ICD-10-CM | POA: Diagnosis not present

## 2020-06-17 DIAGNOSIS — N183 Chronic kidney disease, stage 3 unspecified: Secondary | ICD-10-CM | POA: Diagnosis not present

## 2020-06-17 DIAGNOSIS — I10 Essential (primary) hypertension: Secondary | ICD-10-CM | POA: Insufficient documentation

## 2020-06-17 DIAGNOSIS — M1712 Unilateral primary osteoarthritis, left knee: Secondary | ICD-10-CM | POA: Diagnosis not present

## 2020-06-17 DIAGNOSIS — Z96652 Presence of left artificial knee joint: Secondary | ICD-10-CM | POA: Diagnosis not present

## 2020-06-17 DIAGNOSIS — J449 Chronic obstructive pulmonary disease, unspecified: Secondary | ICD-10-CM | POA: Diagnosis not present

## 2020-06-17 DIAGNOSIS — M25569 Pain in unspecified knee: Secondary | ICD-10-CM

## 2020-06-17 DIAGNOSIS — Z471 Aftercare following joint replacement surgery: Secondary | ICD-10-CM | POA: Diagnosis not present

## 2020-06-17 DIAGNOSIS — I129 Hypertensive chronic kidney disease with stage 1 through stage 4 chronic kidney disease, or unspecified chronic kidney disease: Secondary | ICD-10-CM | POA: Diagnosis not present

## 2020-06-17 HISTORY — PX: TOTAL KNEE ARTHROPLASTY: SHX125

## 2020-06-17 LAB — GLUCOSE, CAPILLARY
Glucose-Capillary: 141 mg/dL — ABNORMAL HIGH (ref 70–99)
Glucose-Capillary: 123 mg/dL — ABNORMAL HIGH (ref 70–99)
Glucose-Capillary: 148 mg/dL — ABNORMAL HIGH (ref 70–99)
Glucose-Capillary: 228 mg/dL — ABNORMAL HIGH (ref 70–99)
Glucose-Capillary: 177 mg/dL — ABNORMAL HIGH (ref 70–99)

## 2020-06-17 LAB — HEMOGLOBIN A1C
Hgb A1c MFr Bld: 6.9 % — ABNORMAL HIGH (ref 4.8–5.6)
Mean Plasma Glucose: 151.33 mg/dL

## 2020-06-17 SURGERY — ARTHROPLASTY, KNEE, TOTAL
Anesthesia: General | Site: Knee | Laterality: Left

## 2020-06-17 MED ORDER — INSULIN ASPART 100 UNIT/ML ~~LOC~~ SOLN
SUBCUTANEOUS | Status: AC
  Filled 2020-06-17: qty 1

## 2020-06-17 MED ORDER — PRAVASTATIN SODIUM 20 MG PO TABS
80.0000 mg | ORAL_TABLET | Freq: Every day | ORAL | Status: DC
  Administered 2020-06-18: 80 mg via ORAL
  Filled 2020-06-17 (×2): qty 4

## 2020-06-17 MED ORDER — DEXAMETHASONE SODIUM PHOSPHATE 10 MG/ML IJ SOLN
INTRAMUSCULAR | Status: DC | PRN
  Administered 2020-06-17: 5 mg via INTRAVENOUS

## 2020-06-17 MED ORDER — POVIDONE-IODINE 10 % EX SWAB
2.0000 "application " | Freq: Once | CUTANEOUS | Status: AC
  Administered 2020-06-17: 2 via TOPICAL

## 2020-06-17 MED ORDER — CHLORHEXIDINE GLUCONATE 0.12 % MT SOLN
15.0000 mL | Freq: Once | OROMUCOSAL | Status: AC
  Administered 2020-06-17: 15 mL via OROMUCOSAL

## 2020-06-17 MED ORDER — CEFAZOLIN SODIUM-DEXTROSE 2-4 GM/100ML-% IV SOLN
INTRAVENOUS | Status: AC
  Administered 2020-06-17: 2 g via INTRAVENOUS
  Filled 2020-06-17: qty 100

## 2020-06-17 MED ORDER — ALBUTEROL SULFATE HFA 108 (90 BASE) MCG/ACT IN AERS
2.0000 | INHALATION_SPRAY | Freq: Four times a day (QID) | RESPIRATORY_TRACT | Status: DC | PRN
  Filled 2020-06-17: qty 6.7

## 2020-06-17 MED ORDER — INSULIN ASPART 100 UNIT/ML ~~LOC~~ SOLN: 0.0000 [IU] | Freq: Every day | SUBCUTANEOUS | Status: DC

## 2020-06-17 MED ORDER — HYDROCODONE-ACETAMINOPHEN 7.5-325 MG PO TABS: 1.0000 | ORAL_TABLET | ORAL | Status: DC | PRN

## 2020-06-17 MED ORDER — MIDAZOLAM HCL 2 MG/2ML IJ SOLN: 1.0000 mg | Freq: Once | INTRAMUSCULAR | Status: AC

## 2020-06-17 MED ORDER — ORAL CARE MOUTH RINSE: 15.0000 mL | Freq: Once | OROMUCOSAL | Status: AC

## 2020-06-17 MED ORDER — METOCLOPRAMIDE HCL 5 MG/ML IJ SOLN: 5.0000 mg | Freq: Three times a day (TID) | INTRAMUSCULAR | Status: DC | PRN

## 2020-06-17 MED ORDER — HYDROCODONE-ACETAMINOPHEN 5-325 MG PO TABS: 1.0000 | ORAL_TABLET | ORAL | Status: DC | PRN

## 2020-06-17 MED ORDER — FENTANYL CITRATE (PF) 100 MCG/2ML IJ SOLN
INTRAMUSCULAR | Status: DC | PRN
  Administered 2020-06-17 (×4): 25 ug via INTRAVENOUS

## 2020-06-17 MED ORDER — BUPIVACAINE LIPOSOME 1.3 % IJ SUSP
INTRAMUSCULAR | Status: AC
  Filled 2020-06-17: qty 20

## 2020-06-17 MED ORDER — TRANEXAMIC ACID-NACL 1000-0.7 MG/100ML-% IV SOLN
1000.0000 mg | INTRAVENOUS | Status: AC
  Administered 2020-06-17: 1000 mg via INTRAVENOUS

## 2020-06-17 MED ORDER — CEFAZOLIN SODIUM-DEXTROSE 2-4 GM/100ML-% IV SOLN
2.0000 g | Freq: Four times a day (QID) | INTRAVENOUS | Status: AC
Start: 2020-06-17 — End: 2020-06-18
  Administered 2020-06-18: 2 g via INTRAVENOUS
  Filled 2020-06-17 (×2): qty 100

## 2020-06-17 MED ORDER — SODIUM CHLORIDE 0.9 % IV SOLN: INTRAVENOUS | Status: DC

## 2020-06-17 MED ORDER — SURGIRINSE WOUND IRRIGATION SYSTEM - OPTIME
TOPICAL | Status: DC | PRN
  Administered 2020-06-17: 900 mL via TOPICAL

## 2020-06-17 MED ORDER — BUDESONIDE 0.5 MG/2ML IN SUSP: 0.5000 mg | Freq: Four times a day (QID) | RESPIRATORY_TRACT | Status: DC | PRN

## 2020-06-17 MED ORDER — FENTANYL CITRATE (PF) 100 MCG/2ML IJ SOLN: 50.0000 ug | Freq: Once | INTRAMUSCULAR | Status: AC

## 2020-06-17 MED ORDER — ACETAMINOPHEN 10 MG/ML IV SOLN
INTRAVENOUS | Status: DC | PRN
  Administered 2020-06-17: 1000 mg via INTRAVENOUS

## 2020-06-17 MED ORDER — CHLORHEXIDINE GLUCONATE 0.12 % MT SOLN
OROMUCOSAL | Status: AC
  Filled 2020-06-17: qty 15

## 2020-06-17 MED ORDER — ONDANSETRON HCL 4 MG/2ML IJ SOLN
INTRAMUSCULAR | Status: AC
  Filled 2020-06-17: qty 2

## 2020-06-17 MED ORDER — METOCLOPRAMIDE HCL 10 MG PO TABS: 5.0000 mg | ORAL_TABLET | Freq: Three times a day (TID) | ORAL | Status: DC | PRN

## 2020-06-17 MED ORDER — BISACODYL 10 MG RE SUPP
10.0000 mg | Freq: Every day | RECTAL | Status: DC | PRN
Start: 2020-06-17 — End: 2020-06-18

## 2020-06-17 MED ORDER — ONDANSETRON HCL 4 MG PO TABS: 4.0000 mg | ORAL_TABLET | Freq: Four times a day (QID) | ORAL | Status: DC | PRN

## 2020-06-17 MED ORDER — MIDAZOLAM HCL 2 MG/2ML IJ SOLN
INTRAMUSCULAR | Status: AC
  Administered 2020-06-17: 1 mg via INTRAVENOUS
  Filled 2020-06-17: qty 2

## 2020-06-17 MED ORDER — METOPROLOL SUCCINATE ER 25 MG PO TB24
25.0000 mg | ORAL_TABLET | Freq: Every day | ORAL | Status: DC
  Administered 2020-06-18: 25 mg via ORAL
  Filled 2020-06-17: qty 1

## 2020-06-17 MED ORDER — DOCUSATE SODIUM 100 MG PO CAPS
100.0000 mg | ORAL_CAPSULE | Freq: Two times a day (BID) | ORAL | Status: DC
  Administered 2020-06-17 – 2020-06-18 (×2): 100 mg via ORAL
  Filled 2020-06-17 (×3): qty 1

## 2020-06-17 MED ORDER — PROPOFOL 500 MG/50ML IV EMUL
INTRAVENOUS | Status: AC
  Filled 2020-06-17: qty 50

## 2020-06-17 MED ORDER — BUPIVACAINE-EPINEPHRINE (PF) 0.5% -1:200000 IJ SOLN
INTRAMUSCULAR | Status: AC
  Filled 2020-06-17: qty 90

## 2020-06-17 MED ORDER — FENTANYL CITRATE (PF) 100 MCG/2ML IJ SOLN
INTRAMUSCULAR | Status: AC
  Administered 2020-06-17: 50 ug via INTRAVENOUS
  Filled 2020-06-17: qty 2

## 2020-06-17 MED ORDER — MORPHINE SULFATE (PF) 2 MG/ML IV SOLN: 0.5000 mg | INTRAVENOUS | Status: DC | PRN

## 2020-06-17 MED ORDER — GLIMEPIRIDE 4 MG PO TABS
4.0000 mg | ORAL_TABLET | Freq: Every day | ORAL | Status: DC
  Administered 2020-06-18: 4 mg via ORAL
  Filled 2020-06-17: qty 1

## 2020-06-17 MED ORDER — FENTANYL CITRATE (PF) 100 MCG/2ML IJ SOLN
INTRAMUSCULAR | Status: AC
  Administered 2020-06-17: 25 ug via INTRAVENOUS
  Filled 2020-06-17: qty 2

## 2020-06-17 MED ORDER — LIDOCAINE HCL (PF) 1 % IJ SOLN
INTRAMUSCULAR | Status: AC
  Filled 2020-06-17: qty 5

## 2020-06-17 MED ORDER — PROPOFOL 500 MG/50ML IV EMUL
INTRAVENOUS | Status: DC | PRN
Start: 2020-06-17 — End: 2020-06-17
  Administered 2020-06-17: 100 ug/kg/min via INTRAVENOUS

## 2020-06-17 MED ORDER — ALUM & MAG HYDROXIDE-SIMETH 200-200-20 MG/5ML PO SUSP: 30.0000 mL | ORAL | Status: DC | PRN

## 2020-06-17 MED ORDER — ONDANSETRON HCL 4 MG/2ML IJ SOLN: 4.0000 mg | Freq: Four times a day (QID) | INTRAMUSCULAR | Status: DC | PRN

## 2020-06-17 MED ORDER — LIDOCAINE HCL (PF) 2 % IJ SOLN
INTRAMUSCULAR | Status: AC
  Filled 2020-06-17: qty 5

## 2020-06-17 MED ORDER — ONDANSETRON HCL 4 MG/2ML IJ SOLN: 4.0000 mg | Freq: Once | INTRAMUSCULAR | Status: DC | PRN

## 2020-06-17 MED ORDER — INSULIN ASPART 100 UNIT/ML ~~LOC~~ SOLN
0.0000 [IU] | Freq: Three times a day (TID) | SUBCUTANEOUS | Status: DC
  Administered 2020-06-17: 2 [IU] via SUBCUTANEOUS
  Filled 2020-06-17: qty 1

## 2020-06-17 MED ORDER — LACTATED RINGERS IV SOLN: INTRAVENOUS | Status: DC

## 2020-06-17 MED ORDER — PHENYLEPHRINE HCL (PRESSORS) 10 MG/ML IV SOLN
INTRAVENOUS | Status: DC | PRN
  Administered 2020-06-17 (×3): 100 ug via INTRAVENOUS

## 2020-06-17 MED ORDER — KETOROLAC TROMETHAMINE 15 MG/ML IJ SOLN
15.0000 mg | Freq: Four times a day (QID) | INTRAMUSCULAR | Status: DC
  Administered 2020-06-17 – 2020-06-18 (×3): 15 mg via INTRAVENOUS
  Filled 2020-06-17 (×3): qty 1

## 2020-06-17 MED ORDER — PHENOL 1.4 % MT LIQD
1.0000 | OROMUCOSAL | Status: DC | PRN
  Filled 2020-06-17: qty 177

## 2020-06-17 MED ORDER — PROPOFOL 10 MG/ML IV BOLUS
INTRAVENOUS | Status: AC
  Filled 2020-06-17: qty 20

## 2020-06-17 MED ORDER — PHENYLEPHRINE HCL (PRESSORS) 10 MG/ML IV SOLN
INTRAVENOUS | Status: AC
  Filled 2020-06-17: qty 1

## 2020-06-17 MED ORDER — BUPIVACAINE LIPOSOME 1.3 % IJ SUSP
INTRAMUSCULAR | Status: DC | PRN
Start: 2020-06-17 — End: 2020-06-17
  Administered 2020-06-17: 20 mL via PERINEURAL

## 2020-06-17 MED ORDER — CEFAZOLIN SODIUM-DEXTROSE 2-4 GM/100ML-% IV SOLN
2.0000 g | INTRAVENOUS | Status: AC
  Administered 2020-06-17: 2 g via INTRAVENOUS

## 2020-06-17 MED ORDER — MAGNESIUM CITRATE PO SOLN
1.0000 | Freq: Once | ORAL | Status: DC | PRN
  Filled 2020-06-17: qty 296

## 2020-06-17 MED ORDER — SODIUM CHLORIDE 0.9 % IV SOLN
INTRAVENOUS | Status: DC | PRN
  Administered 2020-06-17: 30 ug/min via INTRAVENOUS

## 2020-06-17 MED ORDER — FENTANYL CITRATE (PF) 100 MCG/2ML IJ SOLN
INTRAMUSCULAR | Status: AC
  Filled 2020-06-17: qty 2

## 2020-06-17 MED ORDER — BUPIVACAINE HCL (PF) 0.5 % IJ SOLN
INTRAMUSCULAR | Status: AC
  Filled 2020-06-17: qty 10

## 2020-06-17 MED ORDER — SODIUM CHLORIDE FLUSH 0.9 % IV SOLN
INTRAVENOUS | Status: AC
  Filled 2020-06-17: qty 40

## 2020-06-17 MED ORDER — ACETAMINOPHEN 10 MG/ML IV SOLN
INTRAVENOUS | Status: AC
  Filled 2020-06-17: qty 100

## 2020-06-17 MED ORDER — TRANEXAMIC ACID-NACL 1000-0.7 MG/100ML-% IV SOLN
INTRAVENOUS | Status: AC
  Filled 2020-06-17: qty 100

## 2020-06-17 MED ORDER — ASPIRIN 81 MG PO CHEW
81.0000 mg | CHEWABLE_TABLET | Freq: Two times a day (BID) | ORAL | Status: DC
  Administered 2020-06-17 – 2020-06-18 (×2): 81 mg via ORAL
  Filled 2020-06-17 (×2): qty 1

## 2020-06-17 MED ORDER — LIDOCAINE HCL (PF) 1 % IJ SOLN
INTRAMUSCULAR | Status: DC | PRN
  Administered 2020-06-17: 5 mL via SUBCUTANEOUS

## 2020-06-17 MED ORDER — PANTOPRAZOLE SODIUM 20 MG PO TBEC
20.0000 mg | DELAYED_RELEASE_TABLET | Freq: Every day | ORAL | Status: DC
  Administered 2020-06-18: 20 mg via ORAL
  Filled 2020-06-17: qty 1

## 2020-06-17 MED ORDER — BUPIVACAINE HCL (PF) 0.5 % IJ SOLN
INTRAMUSCULAR | Status: DC | PRN
  Administered 2020-06-17: 10 mL via PERINEURAL

## 2020-06-17 MED ORDER — ENALAPRIL MALEATE 20 MG PO TABS
20.0000 mg | ORAL_TABLET | Freq: Two times a day (BID) | ORAL | Status: DC
  Administered 2020-06-17 – 2020-06-18 (×2): 20 mg via ORAL
  Filled 2020-06-17 (×4): qty 1

## 2020-06-17 MED ORDER — DIPHENHYDRAMINE HCL 12.5 MG/5ML PO ELIX: 12.5000 mg | ORAL_SOLUTION | ORAL | Status: DC | PRN

## 2020-06-17 MED ORDER — MENTHOL 3 MG MT LOZG
1.0000 | LOZENGE | OROMUCOSAL | Status: DC | PRN
  Filled 2020-06-17: qty 9

## 2020-06-17 MED ORDER — ACETAMINOPHEN 325 MG PO TABS: 325.0000 mg | ORAL_TABLET | Freq: Four times a day (QID) | ORAL | Status: DC | PRN

## 2020-06-17 MED ORDER — ONDANSETRON HCL 4 MG/2ML IJ SOLN
INTRAMUSCULAR | Status: DC | PRN
  Administered 2020-06-17: 4 mg via INTRAVENOUS

## 2020-06-17 MED ORDER — PROPOFOL 10 MG/ML IV BOLUS
INTRAVENOUS | Status: DC | PRN
  Administered 2020-06-17: 130 mg via INTRAVENOUS

## 2020-06-17 MED ORDER — DEXAMETHASONE SODIUM PHOSPHATE 10 MG/ML IJ SOLN
INTRAMUSCULAR | Status: AC
  Filled 2020-06-17: qty 1

## 2020-06-17 MED ORDER — LIDOCAINE HCL (CARDIAC) PF 100 MG/5ML IV SOSY
PREFILLED_SYRINGE | INTRAVENOUS | Status: DC | PRN
  Administered 2020-06-17: 100 mg via INTRAVENOUS

## 2020-06-17 MED ORDER — FENTANYL CITRATE (PF) 100 MCG/2ML IJ SOLN
25.0000 ug | INTRAMUSCULAR | Status: DC | PRN
  Administered 2020-06-17 (×3): 25 ug via INTRAVENOUS

## 2020-06-17 SURGICAL SUPPLY — 62 items
APL PRP STRL LF DISP 70% ISPRP (MISCELLANEOUS) ×2
BASEPLATE TIBIAL SZ 4 LT (Knees) ×1 IMPLANT
BLADE SAGITTAL AGGR TOOTH XLG (BLADE) ×2 IMPLANT
BLADE SAW SAG 25X90X1.19 (BLADE) ×2 IMPLANT
BOWL CEMENT MIX W/ADAPTER (MISCELLANEOUS) ×2 IMPLANT
BRUSH SCRUB EZ  4% CHG (MISCELLANEOUS) ×2
BRUSH SCRUB EZ 4% CHG (MISCELLANEOUS) ×2 IMPLANT
BSPLAT TIB 4 CMNT M TPR KN LT (Knees) ×1 IMPLANT
CANISTER SUCT 1200ML W/VALVE (MISCELLANEOUS) ×1 IMPLANT
CANISTER SUCT 3000ML PPV (MISCELLANEOUS) ×2 IMPLANT
CEMENT BONE 40GM (Cement) ×4 IMPLANT
CHLORAPREP W/TINT 26 (MISCELLANEOUS) ×4 IMPLANT
COMP PATELLA GENESIS 29 OVAL (Orthopedic Implant) ×2 IMPLANT
COMPONENT FEM OXIN SZ 5N LT (Knees) ×1 IMPLANT
COMPONENT PTLLA GENS 29 OVAL (Orthopedic Implant) IMPLANT
COOLER POLAR GLACIER W/PUMP (MISCELLANEOUS) ×2 IMPLANT
COVER WAND RF STERILE (DRAPES) ×2 IMPLANT
CUFF TOURN SGL QUICK 30 (TOURNIQUET CUFF) ×2
CUFF TRNQT CYL 30X4X21-28X (TOURNIQUET CUFF) ×1 IMPLANT
DRAPE 3/4 80X56 (DRAPES) ×4 IMPLANT
DRAPE INCISE IOBAN 66X60 STRL (DRAPES) ×2 IMPLANT
ELECT REM PT RETURN 9FT ADLT (ELECTROSURGICAL) ×2
ELECTRODE REM PT RTRN 9FT ADLT (ELECTROSURGICAL) ×1 IMPLANT
GAUZE SPONGE 4X4 12PLY STRL (GAUZE/BANDAGES/DRESSINGS) ×2 IMPLANT
GAUZE XEROFORM 1X8 LF (GAUZE/BANDAGES/DRESSINGS) ×2 IMPLANT
GLOVE INDICATOR 8.0 STRL GRN (GLOVE) ×2 IMPLANT
GLOVE SURG ENC MOIS LTX SZ8 (GLOVE) ×2 IMPLANT
GLOVE SURG ORTHO LTX SZ8 (GLOVE) ×6 IMPLANT
GLOVE SURG UNDER POLY LF SZ8.5 (GLOVE) ×2 IMPLANT
GOWN STRL REUS W/ TWL LRG LVL3 (GOWN DISPOSABLE) ×1 IMPLANT
GOWN STRL REUS W/ TWL XL LVL3 (GOWN DISPOSABLE) ×1 IMPLANT
GOWN STRL REUS W/TWL LRG LVL3 (GOWN DISPOSABLE) ×2
GOWN STRL REUS W/TWL XL LVL3 (GOWN DISPOSABLE) ×2
HOOD PEEL AWAY FLYTE STAYCOOL (MISCELLANEOUS) ×6 IMPLANT
INSERT ARTI HI FLEX 9 SZ 3-4 (Insert) ×2 IMPLANT
IRRIGATION SURGIPHOR STRL (IV SOLUTION) ×2 IMPLANT
IV NS 1000ML (IV SOLUTION) ×2
IV NS 1000ML BAXH (IV SOLUTION) ×1 IMPLANT
KIT TURNOVER KIT A (KITS) ×2 IMPLANT
MANIFOLD NEPTUNE II (INSTRUMENTS) ×2 IMPLANT
MAT ABSORB  FLUID 56X50 GRAY (MISCELLANEOUS) ×2
MAT ABSORB FLUID 56X50 GRAY (MISCELLANEOUS) ×1 IMPLANT
NDL SAFETY ECLIPSE 18X1.5 (NEEDLE) ×1 IMPLANT
NEEDLE HYPO 18GX1.5 SHARP (NEEDLE) ×2
NEEDLE SPNL 20GX3.5 QUINCKE YW (NEEDLE) ×2 IMPLANT
NS IRRIG 1000ML POUR BTL (IV SOLUTION) ×2 IMPLANT
PACK TOTAL KNEE (MISCELLANEOUS) ×2 IMPLANT
PAD DE MAYO PRESSURE PROTECT (MISCELLANEOUS) ×2 IMPLANT
PAD WRAPON POLAR KNEE (MISCELLANEOUS) ×1 IMPLANT
PULSAVAC PLUS IRRIG FAN TIP (DISPOSABLE) ×2
STAPLER SKIN PROX 35W (STAPLE) ×2 IMPLANT
SUCTION FRAZIER HANDLE 10FR (MISCELLANEOUS) ×2
SUCTION TUBE FRAZIER 10FR DISP (MISCELLANEOUS) ×1 IMPLANT
SUT DVC 2 QUILL PDO  T11 36X36 (SUTURE) ×2
SUT DVC 2 QUILL PDO T11 36X36 (SUTURE) ×1 IMPLANT
SUT VIC AB 2-0 CT1 18 (SUTURE) ×2 IMPLANT
SUT VIC AB 2-0 CT1 27 (SUTURE)
SUT VIC AB 2-0 CT1 TAPERPNT 27 (SUTURE) IMPLANT
SUT VIC AB PLUS 45CM 1-MO-4 (SUTURE) ×2 IMPLANT
SYR 30ML LL (SYRINGE) ×6 IMPLANT
TIP FAN IRRIG PULSAVAC PLUS (DISPOSABLE) ×1 IMPLANT
WRAPON POLAR PAD KNEE (MISCELLANEOUS) ×2

## 2020-06-17 NOTE — Evaluation (Signed)
Physical Therapy Evaluation Patient Details Name: Julie Keith MRN: [id number] DOB: 1945-08-07 Today's Date: 06/17/2020   History of Present Illness  Pt admitted for L TKR. Pt is POD 0 at time of surgery  Clinical Impression  Pt is a pleasant 75 year old female who was admitted for L TKR. Pt performs bed mobility, transfers, and ambulation with cga and RW. Pt demonstrates ability to perform 10 SLRs with independence, therefore does not require KI for mobility. Pt demonstrates deficits with strength/mobility/ROM. Pt reports she wears 2 L of O2 at night, removed for exertion. Sats decreased to 85%, replaced O2 with improved to 99%. Would benefit from skilled PT to address above deficits and promote optimal return to PLOF. Recommend transition to HHPT upon discharge from acute hospitalization.    Follow Up Recommendations Home health PT;Supervision for mobility/OOB    Equipment Recommendations  Rolling walker with 5" wheels;3in1 (PT)    Recommendations for Other Services       Precautions / Restrictions Precautions Precautions: Fall;Knee Precaution Booklet Issued: No Restrictions Weight Bearing Restrictions: Yes LLE Weight Bearing: Weight bearing as tolerated      Mobility  Bed Mobility Overal bed mobility: Needs Assistance Bed Mobility: Supine to Sit     Supine to sit: Min guard     General bed mobility comments: safe technique and follows commands well    Transfers Overall transfer level: Needs assistance   Transfers: Sit to/from Stand Sit to Stand: Min guard         General transfer comment: cues for pushing from seated surface  Ambulation/Gait Ambulation/Gait assistance: Min guard Gait Distance (Feet): 60 Feet Assistive device: Rolling walker (2 wheeled) Gait Pattern/deviations: Step-to pattern     General Gait Details: ambualted with slow step to gait pattern. Chair follow for safety. Keeps L knee in flexed position despite cues. Decreased heel strike  noted  Stairs            Wheelchair Mobility    Modified Rankin (Stroke Patients Only)       Balance Overall balance assessment: Needs assistance Sitting-balance support: Feet supported Sitting balance-Leahy Scale: Good     Standing balance support: Bilateral upper extremity supported Standing balance-Leahy Scale: Good                               Pertinent Vitals/Pain Pain Assessment: No/denies pain    Home Living Family/patient expects to be discharged to:: Private residence Living Arrangements: Alone Available Help at Discharge: Family (son is planning to come live with her briefly) Type of Home: House Home Access: Stairs to enter Entrance Stairs-Rails: Right Entrance Stairs-Number of Steps: 4 Home Layout: One level Home Equipment: Cane - single point      Prior Function Level of Independence: Independent with assistive device(s)         Comments: was using SPC prior; no falls     Hand Dominance        Extremity/Trunk Assessment   Upper Extremity Assessment Upper Extremity Assessment: Overall WFL for tasks assessed    Lower Extremity Assessment Lower Extremity Assessment: Generalized weakness (L LE grossly 3/5; R LE grossly 4/5)       Communication   Communication: No difficulties  Cognition Arousal/Alertness: Suspect due to medications Behavior During Therapy: WFL for tasks assessed/performed Overall Cognitive Status: Within Functional Limits for tasks assessed  General Comments      Exercises Total Joint Exercises Goniometric ROM: L knee AAROM: 14-76 degrees Other Exercises Other Exercises: supine ther-0ex performed on L LE including AP, QS, SLRs, and hip abd/add. 10 reps with cues for sequencing   Assessment/Plan    PT Assessment Patient needs continued PT services  PT Problem List Decreased strength;Decreased range of motion;Decreased balance;Decreased  mobility;Decreased knowledge of use of DME;Pain       PT Treatment Interventions DME instruction;Gait training;Stair training;Therapeutic exercise;Balance training    PT Goals (Current goals can be found in the Care Plan section)  Acute Rehab PT Goals Patient Stated Goal: to go home PT Goal Formulation: With patient Time For Goal Achievement: 07/01/20 Potential to Achieve Goals: Good    Frequency BID   Barriers to discharge        Co-evaluation               AM-PAC PT "6 Clicks" Mobility  Outcome Measure Help needed turning from your back to your side while in a flat bed without using bedrails?: A Little Help needed moving from lying on your back to sitting on the side of a flat bed without using bedrails?: A Little Help needed moving to and from a bed to a chair (including a wheelchair)?: A Little Help needed standing up from a chair using your arms (e.g., wheelchair or bedside chair)?: A Little Help needed to walk in hospital room?: A Little Help needed climbing 3-5 steps with a railing? : A Lot 6 Click Score: 17    End of Session Equipment Utilized During Treatment: Gait belt Activity Tolerance: Patient tolerated treatment well Patient left: in bed;with bed alarm set (getting ready to transport to new room) Nurse Communication: Mobility status PT Visit Diagnosis: Unsteadiness on feet (R26.81);Muscle weakness (generalized) (M62.81);Difficulty in walking, not elsewhere classified (R26.2);Pain Pain - Right/Left: Left Pain - part of body: Knee    Time: 4315-4008 PT Time Calculation (min) (ACUTE ONLY): 25 min   Charges:   PT Evaluation $PT Eval Low Complexity: 1 Low PT Treatments $Therapeutic Exercise: 8-22 mins        Elizabeth Palau, PT, DPT [phone]   Rogelio Winbush 06/17/2020, 4:42 PM

## 2020-06-17 NOTE — Anesthesia Preprocedure Evaluation (Signed)
Anesthesia Evaluation  Patient identified by MRN, date of birth, ID band Patient awake    Reviewed: Allergy & Precautions, H&P , NPO status , Patient's Chart, lab work & pertinent test results, reviewed documented beta blocker date and time   History of Anesthesia Complications Negative for: history of anesthetic complications  Airway Mallampati: III  TM Distance: >3 FB Neck ROM: full    Dental  (+) Dental Advidsory Given, Missing, Poor Dentition   Pulmonary shortness of breath and with exertion, neg sleep apnea, COPD,  COPD inhaler and oxygen dependent, neg recent URI, former smoker,    Pulmonary exam normal breath sounds clear to auscultation       Cardiovascular Exercise Tolerance: Good hypertension, (-) angina(-) Past MI and (-) Cardiac Stents Normal cardiovascular exam(-) dysrhythmias + Valvular Problems/Murmurs  Rhythm:regular Rate:Normal     Neuro/Psych negative neurological ROS  negative psych ROS   GI/Hepatic Neg liver ROS, GERD  ,  Endo/Other  diabetes  Renal/GU CRFRenal disease  negative genitourinary   Musculoskeletal   Abdominal   Peds  Hematology negative hematology ROS (+)   Anesthesia Other Findings Past Medical History: No date: Allergic rhinitis No date: Diabetes (HCC) No date: Dyspnea No date: Emphysema lung (HCC) 10/10/2015: GERD (gastroesophageal reflux disease) 04/08/2016: Hx of tobacco use, presenting hazards to health No date: Hypercholesteremia No date: Hypertension 10/10/2015: Psoriasis   Reproductive/Obstetrics negative OB ROS                             Anesthesia Physical Anesthesia Plan  ASA: III  Anesthesia Plan: General   Post-op Pain Management:    Induction: Intravenous  PONV Risk Score and Plan: 3 and Ondansetron, Dexamethasone and Treatment may vary due to age or medical condition  Airway Management Planned: LMA  Additional Equipment:    Intra-op Plan:   Post-operative Plan: Extubation in OR  Informed Consent: I have reviewed the patients History and Physical, chart, labs and discussed the procedure including the risks, benefits and alternatives for the proposed anesthesia with the patient or authorized representative who has indicated his/her understanding and acceptance.     Dental Advisory Given  Plan Discussed with: Anesthesiologist, CRNA and Surgeon  Anesthesia Plan Comments:         Anesthesia Quick Evaluation

## 2020-06-17 NOTE — Op Note (Signed)
DATE OF SURGERY:  06/17/2020 TIME: 2:04 PM  PATIENT NAME:  Julie Keith   AGE: 75 y.o.    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   PREOPERATIVE INDICATIONS:  Julie Keith is an 75 y.o. 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  Radiographs have demonstrated tricompartmental osteoarthritis joint space narrowing, osteophytes, subchondral sclerosis and cyst formation.  The risks, benefits, and alternatives were discussed at length including but not limited to the risks of infection, bleeding, nerve or blood vessel injury, knee stiffness, fracture, dislocation, loosening or failure of the hardware and the need for further surgery. Medical risks include but not limited to DVT and pulmonary embolism, myocardial infarction, stroke, pneumonia, respiratory failure and death. I discussed these risks with the patient in my office prior to the date of surgery. They understood these risks and were willing to proceed.  OPERATIVE FINDINGS AND UNIQUE ASPECTS OF THE CASE:  All three compartments with advanced and severe degenerative changes, large osteophytes and an abundance of synovial fluid. Significant deformity was also noted. A decision was made to proceed with total knee arthroplasty.   OPERATIVE DESCRIPTION:  The patient was brought  to the operative room and placed in a supine position after undergoing placement of a general anesthetic. IV antibiotics were given. Patient received tranexamic acid. The lower extremity was prepped and draped in the usual sterile fashion.  A time out was performed to verify the patient's name, date of birth, medical record number, correct site of surgery and correct procedure to be performed. The timeout was also used to confirm the patient received antibiotics and that appropriate instruments, implants and radiographs studies were available in the room.  The leg was elevated and exsanguinated with an Esmarch and the tourniquet was inflated to 250 mmHg.  A midline incision was made over the left knee.. A medial parapatellar arthrotomy was then made and the patella subluxed laterally and the knee was brought into 90 of flexion. Hoffa's fat pad along with the anterior cruciate ligament was resected and the medial joint line was exposed.  Attention was then turned to preparation of the patella. The thickness of the patella was measured with a caliper, the diameter measured with the patella templates.  The patella resection was then made with an oscillating saw using the patella cutting guide.  The 29 mm button fit appropriately.  3 peg holes for the patella component were then drilled.  The extramedullary tibial cutting guide was then placed using the anterior tibial crest and second ray of the foot as a reference.  The tibial cutting guide was adjusted to allow for appropriate posterior slope.  The tibial cutting block was pinned into position. The slotted stylus was used to measure the proximal tibial resection of 9 mm off the high lateral side. Care was taken during the tibial resection to protect the medial and collateral ligaments.  The resected tibial bone was  removed.  The distal femur was resected using the Visionaire cutting guide.  Care was taken to protect the collateral ligaments during distal  femoral resection.  The distal femoral resection was performed with an oscillating saw. The femoral cutting guide was then removed. Extension gap was measured with a 9 mm spacer block and alignment and extension was confirmed using a long alignment rod. The femur was sized to be a 5 narrow. Rotation of the referencing guide was checked with the epicondylar axis and Whitesides line. Then the 4-in-1 cutting jig was then applied to the distal femur. A stylus was used to confirm that the anterior femur would not be notched.   Then the anterior, posterior and chamfer femoral cuts were then made with an oscillating saw.  The knee was distracted and all posterior osteophytes were removed.  The flexion gap was then measured with a flexion spacer block and long alignment rod and was found to be symmetric with the extension gap and perpendicular to mechanical axis of the tibia.  The proximal tibia plateau was then sized with trial trays. The best coverage was achieved with a size 4. This tibial tray was then pinned into position. The proximal tibia was then prepared with the keel punch.  After tibial preparation was completed, all trial components were inserted with polyethylene trials. The knee achieved full extension and flexed to 120 degrees. Ligament were stable to varus and valgus at full extension as well as 30, 60 and 90 degrees of flexion.   The trials were then placed. Knee was taken through a full range of motion and deemed to be stable with the trial components. All trial components were then removed.  The joint was copiously irrigated with pulse lavage.  The final total knee arthroplasty components were then cemented into place. The knee was held in extension while cement was allowed to cure.The knee was taken through a range of motion and the patella tracked well and the knee was again irrigated copiously.  The knee capsule was then injected with Exparel.  The medial arthrotomy was closed with #1 Vicryl  and #2 Quill. The subcutaneous tissue closed with  2-0 vicryl, and skin approximated with staples.  A dry sterile and compressive dressing was applied.  A Polar Care was applied to the operative knee.  The patient was awakened and brought to the PACU in stable and satisfactory condition.  All sharp, lap and instrument counts were correct at the conclusion the case. I spoke with the patient's family in the postop consultation room to let them know the case had been performed without complication and the patient was stable in recovery room.   Total tourniquet time was 44 minutes.

## 2020-06-17 NOTE — H&P (Signed)
The patient has been re-examined, and the chart reviewed, and there have been no interval changes to the documented history and physical.  Plan a left total knee today.  Anesthesia is consulted regarding a peripheral nerve block for post-operative pain.  The risks, benefits, and alternatives have been discussed at length, and the patient is willing to proceed.     

## 2020-06-17 NOTE — Transfer of Care (Signed)
Immediate Anesthesia Transfer of Care Note  Patient: Julie Keith  Procedure(s) Performed: TOTAL KNEE ARTHROPLASTY (Left Knee)  Patient Location: PACU  Anesthesia Type:General  Level of Consciousness: drowsy and patient cooperative  Airway & Oxygen Therapy: Patient Spontanous Breathing and Patient connected to nasal cannula oxygen  Post-op Assessment: Report given to RN and Post -op Vital signs reviewed and stable  Post vital signs: Reviewed and stable  Last Vitals:  Vitals Value Taken Time  BP 106/69 06/17/20 1418  Temp    Pulse 53 06/17/20 1418  Resp 18 06/17/20 1418  SpO2 99 % 06/17/20 1418  Vitals shown include unvalidated device data.  Last Pain:  Vitals:   06/17/20 1029  TempSrc: Temporal  PainSc: 0-No pain         Complications: No complications documented.

## 2020-06-17 NOTE — Anesthesia Procedure Notes (Signed)
Procedure Name: LMA Insertion Date/Time: 06/17/2020 12:32 PM Performed by: Omer Jack, CRNA Pre-anesthesia Checklist: Patient identified, Patient being monitored, Timeout performed, Emergency Drugs available and Suction available Patient Re-evaluated:Patient Re-evaluated prior to induction Oxygen Delivery Method: Circle system utilized Preoxygenation: Pre-oxygenation with 100% oxygen Induction Type: IV induction Ventilation: Mask ventilation without difficulty LMA: LMA inserted LMA Size: 4.0 Tube type: Oral Number of attempts: 1 Placement Confirmation: positive ETCO2 and breath sounds checked- equal and bilateral Tube secured with: Tape Dental Injury: Teeth and Oropharynx as per pre-operative assessment

## 2020-06-17 NOTE — Progress Notes (Signed)
Patient appeared to running Afib so I called the anesthesiologist and they agreed to do a EKG which turns out to be sinus bradycardia with sinus arrhythmia. I informed the patient that she was fine and that I will continue to monitor her.

## 2020-06-17 NOTE — Anesthesia Procedure Notes (Signed)
Anesthesia Regional Block: Adductor canal block   Pre-Anesthetic Checklist: ,, timeout performed, Correct Patient, Correct Site, Correct Laterality, Correct Procedure, Correct Position, site marked, Risks and benefits discussed,  Surgical consent,  Pre-op evaluation,  At surgeon's request and post-op pain management  Laterality: Left and Lower  Prep: chloraprep       Needles:  Injection technique: Single-shot  Needle Type: Stimiplex     Needle Length: 5cm  Needle Gauge: 22     Additional Needles:   Procedures:,,,, ultrasound used (permanent image in chart),,,,  Narrative:  Start time: 06/17/2020 11:29 AM End time: 06/17/2020 11:32 AM Injection made incrementally with aspirations every 5 mL.  Performed by: Personally  Anesthesiologist: Lenard Simmer, MD  Additional Notes: Functioning IV was confirmed and monitors were applied.  A 52mm 22ga Stimuplex needle was used. Sterile prep and drape,hand hygiene and sterile gloves were used.  Negative aspiration and negative test dose prior to incremental administration of local anesthetic. The patient tolerated the procedure well.

## 2020-06-18 ENCOUNTER — Encounter: Payer: Self-pay | Admitting: Orthopedic Surgery

## 2020-06-18 ENCOUNTER — Other Ambulatory Visit: Payer: Self-pay

## 2020-06-18 DIAGNOSIS — I10 Essential (primary) hypertension: Secondary | ICD-10-CM | POA: Diagnosis not present

## 2020-06-18 DIAGNOSIS — M1712 Unilateral primary osteoarthritis, left knee: Secondary | ICD-10-CM | POA: Diagnosis not present

## 2020-06-18 DIAGNOSIS — Z96652 Presence of left artificial knee joint: Secondary | ICD-10-CM | POA: Diagnosis not present

## 2020-06-18 DIAGNOSIS — E119 Type 2 diabetes mellitus without complications: Secondary | ICD-10-CM | POA: Diagnosis not present

## 2020-06-18 LAB — BASIC METABOLIC PANEL
Anion gap: 5 (ref 5–15)
BUN: 18 mg/dL (ref 8–23)
CO2: 28 mmol/L (ref 22–32)
Calcium: 8.4 mg/dL — ABNORMAL LOW (ref 8.9–10.3)
Chloride: 104 mmol/L (ref 98–111)
Creatinine, Ser: 0.99 mg/dL (ref 0.44–1.00)
GFR, Estimated: 59 mL/min — ABNORMAL LOW (ref >60)
Glucose, Bld: 161 mg/dL — ABNORMAL HIGH (ref 70–99)
Potassium: 5 mmol/L (ref 3.5–5.1)
Sodium: 137 mmol/L (ref 135–145)

## 2020-06-18 LAB — CBC
HCT: 35.6 % — ABNORMAL LOW (ref 36.0–46.0)
Hemoglobin: 11.3 g/dL — ABNORMAL LOW (ref 12.0–15.0)
MCH: 28.3 pg (ref 26.0–34.0)
MCHC: 31.7 g/dL (ref 30.0–36.0)
MCV: 89 fL (ref 80.0–100.0)
Platelets: 283 10*3/uL (ref 150–400)
RBC: 4 MIL/uL (ref 3.87–5.11)
RDW: 14.1 % (ref 11.5–15.5)
WBC: 17.2 10*3/uL — ABNORMAL HIGH (ref 4.0–10.5)
nRBC: 0 % (ref 0.0–0.2)

## 2020-06-18 LAB — GLUCOSE, CAPILLARY: Glucose-Capillary: 136 mg/dL — ABNORMAL HIGH (ref 70–99)

## 2020-06-18 MED ORDER — METHOCARBAMOL 500 MG PO TABS: 500.0000 mg | ORAL_TABLET | Freq: Three times a day (TID) | ORAL | 1 refills | Status: DC | PRN

## 2020-06-18 MED ORDER — HYDROCODONE-ACETAMINOPHEN 5-325 MG PO TABS: 1.0000 | ORAL_TABLET | ORAL | 0 refills | Status: DC | PRN

## 2020-06-18 MED ORDER — ASPIRIN 81 MG PO CHEW: 81.0000 mg | CHEWABLE_TABLET | Freq: Two times a day (BID) | ORAL | 0 refills | Status: AC

## 2020-06-18 MED ORDER — DOCUSATE SODIUM 100 MG PO CAPS: 100.0000 mg | ORAL_CAPSULE | Freq: Two times a day (BID) | ORAL | 0 refills | Status: DC

## 2020-06-18 NOTE — TOC Initial Note (Signed)
Transition of Care [facility]) - Initial/Assessment Note    Patient Details  Name: Julie Keith MRN: [id number] Date of Birth: January 25, 1946  [facility] of [facility]) CM/SW Contact:    Shelbie Ammons, RN Phone Number: 06/18/2020, 9:14 AM  Clinical Narrative:  RNCM met with patient and son at bedside. Patient sitting up in bed, reports to feeling well today and is happy to be going home. Patient reports that she lives alone but that her son Truman Hayward will be there to assist her when needed. Patient is agreeable to home health and does not have preference as to agency, she is also agreeable to [facility] and 3N1.  RNCM reached out to [place] with [facility] and she accepted referral for home health.  RNCM reached out to [facility] with [employer] for rolling walker and 3N1.                  Expected Discharge Plan: [place] Barriers to Discharge: No Barriers Identified   Patient Goals and CMS Choice        Expected Discharge Plan and Services Expected Discharge Plan: [place]       Living arrangements for the past 2 months: Single Family Home Expected Discharge Date: 06/18/20               DME Arranged: 3-N-1,Walker rolling DME Agency: [employer] Date DME Agency Contacted: 06/18/20 Time DME Agency Contacted: [phone] Representative spoke with at DME Agency: [place]: PT,OT [place] Agency: [facility] Date [facility] Agency Contacted: 06/18/20 Time [place]: [phone] Representative spoke with at [place]: [place]  Prior Living Arrangements/Services Living arrangements for the past 2 months: [place]   Patient language and need for interpreter reviewed:: Yes Do you feel safe going back to the place where you live?: Yes      Need for Family Participation in Patient Care: Yes (Comment) Care giver support system in place?: Yes (comment)   Criminal Activity/Legal Involvement Pertinent to Current Situation/Hospitalization: No - Comment as  needed  Activities of Daily Living Home Assistive Devices/Equipment: CBG Meter,Cane (specify quad or straight) ADL Screening (condition at time of admission) Patient's cognitive ability adequate to safely complete daily activities?: Yes Is the patient deaf or have difficulty hearing?: No Does the patient have difficulty seeing, even when wearing glasses/contacts?: No Does the patient have difficulty concentrating, remembering, or making decisions?: No Patient able to express need for assistance with ADLs?: Yes Does the patient have difficulty dressing or bathing?: No Independently performs ADLs?: Yes (appropriate for developmental age) Does the patient have difficulty walking or climbing stairs?: Yes Weakness of Legs: Both Weakness of Arms/Hands: None  Permission Sought/Granted                  Emotional Assessment Appearance:: Appears stated age Attitude/Demeanor/Rapport: Engaged Affect (typically observed): Calm,Appropriate Orientation: : Oriented to Situation,Oriented to  Time,Oriented to Place,Oriented to Self Alcohol / Substance Use: Not Applicable Psych Involvement: No (comment)  Admission diagnosis:  S/P TKR (total knee replacement) using cement, left [Z96.652] Patient Active Problem List   Diagnosis Date Noted  . S/P TKR (total knee replacement) using cement, left 06/17/2020  . Osteoarthritis of left knee 02/28/2020  . Stage 3 chronic kidney disease ([place]) 09/21/2019  . Psoriasis 10/10/2015  . GERD (gastroesophageal reflux disease) 10/10/2015  . Type 2 diabetes mellitus with diabetic nephropathy, without long-term current use of insulin ([place]) 10/31/2014  . Essential hypertension 10/31/2014  .  Hyperlipidemia 10/31/2014  . Morbid obesity ([place]) 10/31/2014  . COPD (chronic obstructive pulmonary disease) ([place]) 06/28/2014   PCP:  Delsa Grana, PA-C Pharmacy:   [facility] [address] [place] [address] Phone:  [phone] Fax: [phone]     Social Determinants of Health (SDOH) Interventions    Readmission Risk Interventions No flowsheet data found.

## 2020-06-18 NOTE — Anesthesia Postprocedure Evaluation (Signed)
Anesthesia Post Note  Patient: Julie Keith  Procedure(s) Performed: TOTAL KNEE ARTHROPLASTY (Left Knee)  Patient location during evaluation: PACU Anesthesia Type: General Level of consciousness: awake and alert Pain management: pain level controlled Vital Signs Assessment: post-procedure vital signs reviewed and stable Respiratory status: spontaneous breathing, nonlabored ventilation, respiratory function stable and patient connected to nasal cannula oxygen Cardiovascular status: blood pressure returned to baseline and stable Postop Assessment: no apparent nausea or vomiting Anesthetic complications: no   No complications documented.   Last Vitals:  Vitals:   06/18/20 0423 06/18/20 0811  BP: (!) 141/61 (!) 145/54  Pulse: 64 66  Resp: 18 17  Temp: 36.8 C 36.8 C  SpO2: 99% 99%    Last Pain:  Vitals:   06/18/20 0423  TempSrc: Oral  PainSc:                  Lenard Simmer

## 2020-06-18 NOTE — Discharge Summary (Signed)
Physician Discharge Summary  Patient ID: Julie Keith MRN: [id number] DOB/AGE: December 14, 1945 75 y.o.  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 Allergic rhinitis   . Diabetes ([place])   . Dyspnea   . Emphysema lung ([place])   . GERD (gastroesophageal reflux disease) 10/10/2015  . Hx of tobacco use, presenting hazards to health 04/08/2016  . Hypercholesteremia   . Hypertension   . Psoriasis 10/10/2015    Surgeries: Procedure(s): TOTAL KNEE ARTHROPLASTY on 06/17/2020   Consultants (if any):   Discharged Condition: Improved  Hospital Course: Julie Keith is an 75 y.o. 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    She was given perioperative antibiotics:  Anti-infectives (From admission, onward)   Start     Dose/Rate Route Frequency Ordered Stop   06/17/20 1845  ceFAZolin (ANCEF) IVPB 2g/100 mL premix        2 g 200 mL/hr over 30 Minutes Intravenous Every 6 hours 06/17/20 1756 06/18/20 0032   06/17/20 1024  ceFAZolin (ANCEF) 2-4 GM/100ML-% IVPB       Note to Pharmacy: Leonia Reader   : cabinet override      06/17/20 1024 06/17/20 2118   06/17/20 0600  ceFAZolin (ANCEF) IVPB 2g/100 mL premix        2 g 200 mL/hr over 30 Minutes Intravenous On call to O.R. 06/17/20 0225 06/17/20 1303    .  She was given sequential compression devices, early ambulation, and aspirin for DVT prophylaxis.  She benefited maximally from the hospital stay and there were no complications.    Recent vital signs:   Vitals:   06/18/20 0423 06/18/20 0811  BP: (!) 141/61 (!) 145/54  Pulse: 64 66  Resp: 18 17  Temp: 98.2 F (36.8 C) 98.3 F (36.8 C)  SpO2: 99% 99%    Recent laboratory studies:  Lab Results  Component Value Date   HGB 11.3 (L) 06/18/2020   HGB 13.5 04/15/2020   HGB 12.8 05/16/2019   Lab Results  Component Value Date   WBC 17.2 (H) 06/18/2020   PLT 283 06/18/2020   Lab Results  Component Value Date   INR 1.0 06/12/2020   Lab Results  Component Value Date   NA 137 06/18/2020   K 5.0 06/18/2020   CL 104 06/18/2020   CO2 28 06/18/2020   BUN 18 06/18/2020   CREATININE 0.99 06/18/2020   GLUCOSE 161 (H) 06/18/2020    Discharge Medications:   Allergies as of 06/18/2020   No Known Allergies     Medication List    STOP taking these medications   aspirin EC 81 MG tablet Replaced by: aspirin 81 MG chewable tablet   traMADol 50 MG tablet Commonly known as: ULTRAM     TAKE these medications   albuterol 108 (90 Base) MCG/ACT inhaler Commonly known as: VENTOLIN HFA Inhale 2 puffs into the lungs every 6 (six) hours as needed for wheezing or shortness of breath.   aspirin 81 MG chewable tablet Chew 1 tablet (81 mg total) by mouth 2 (two) times daily. Replaces: aspirin EC 81  MG tablet   budesonide 0.5 MG/2ML nebulizer solution Commonly known as: PULMICORT Take 0.5 mg by nebulization every 6 (six) hours as needed (asthma).   docusate sodium 100 MG capsule Commonly known as: COLACE Take 1 capsule (100 mg total) by mouth 2 (two) times daily.   enalapril 20 MG tablet Commonly known as: VASOTEC Take 1 tablet (20 mg total) by mouth 2 (two) times daily.   glimepiride 4 MG tablet Commonly known as: AMARYL Take 1 tablet (4 mg total) by mouth daily with breakfast.   HYDROcodone-acetaminophen 5-325 MG tablet Commonly known as: NORCO/VICODIN Take 1 tablet by mouth every 4 (four) hours as needed for moderate pain.   methocarbamol 500 MG tablet Commonly known as:  Robaxin Take 1 tablet (500 mg total) by mouth every 8 (eight) hours as needed for muscle spasms.   metoprolol succinate 25 MG 24 hr tablet Commonly known as: TOPROL-XL Take 1 tablet (25 mg total) by mouth daily.   OneTouch Delica Lancets 53G Misc 1 Bottle by Does not apply route in the morning, at noon, and at bedtime.   pantoprazole 20 MG tablet Commonly known as: PROTONIX Take 1 tablet (20 mg total) by mouth daily.   pravastatin 80 MG tablet Commonly known as: PRAVACHOL Take 1 tablet (80 mg total) by mouth daily. What changed: when to take this            Durable Medical Equipment  (From admission, onward)         Start     Ordered   06/18/20 0827  For home use only DME 3 n 1  Once        06/18/20 0827   06/18/20 0827  For home use only DME Walker rolling  Once       Question Answer Comment  Walker: With 5 Inch Wheels   Patient needs a walker to treat with the following condition Osteoarthritis of left knee      06/18/20 0827          Diagnostic Studies: DG Knee Left Port  Result Date: 06/17/2020 CLINICAL DATA:  Status post total knee replacement. EXAM: PORTABLE LEFT KNEE - 1-2 VIEW COMPARISON:  03/09/2019 FINDINGS: Two views of the left knee demonstrate a total knee arthroplasty. The knee is located without a periprosthetic fracture. There is expected gas in the soft tissues and suprapatellar region. Skin staples along the anterior knee. 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 No complicating features. Electronically Signed   By: Markus Daft M.D.   On: 06/17/2020 15:54   [place] OR NERVE BLOCK-IMAGE ONLY [facility])  Result Date: 06/17/2020 There is no interpretation for this exam.  This order is for images obtained during a surgical procedure.  Please See "Surgeries" Tab for more information regarding the procedure.    Disposition: Discharge disposition: 01-Home or Self Care       Discharge home today if PT goals met Follow up in 2 weeks  in office for staple removal.  Call office to confirm [phone]     Signed: Carlynn Spry ,PA-C 06/18/2020, 8:28 AM

## 2020-06-18 NOTE — Discharge Instructions (Signed)
Continue weight bear as tolerated on the left lower extremity.    Elevate the left lower extremity whenever possible and continue the polar care while elevating the extremity. Patient may shower. No bath or submerging the wound.    Take aspirin as directed for blood clot prevention.  Continue to work on knee range of motion exercises at home as instructed by physical therapy. Continue to use a walker for assistance with ambulation until cleared by physical therapy.  Call [phone] with any questions, such as fever > 101.5 degrees, drainage from the wound or shortness of breath. 

## 2020-06-18 NOTE — Progress Notes (Signed)
Subjective:  Patient reports pain as mild.    Objective:   VITALS:   Vitals:   06/17/20 1954 06/18/20 0017 06/18/20 0423 06/18/20 0811  BP: (!) 150/60 (!) 144/53 (!) 141/61 (!) 145/54  Pulse: 62 (!) 57 64 66  Resp: $Remo'18 18 18 17  'lVebc$ Temp: 97.7 F (36.5 C) 97.7 F (36.5 C) 98.2 F (36.8 C) 98.3 F (36.8 C)  TempSrc: Oral  Oral   SpO2: 95% 98% 99% 99%  Weight:      Height:        PHYSICAL EXAM:  Neurologically intact ABD soft Neurovascular intact Sensation intact distally Intact pulses distally Dorsiflexion/Plantar flexion intact Incision: dressing C/D/I No cellulitis present Compartment soft  LABS  Results for orders placed or performed during the hospital encounter of 06/17/20 (from the past 24 hour(s))  Glucose, capillary     Status: Abnormal   Collection Time: 06/17/20 10:33 AM  Result Value Ref Range   Glucose-Capillary 141 (H) 70 - 99 mg/dL  Glucose, capillary     Status: Abnormal   Collection Time: 06/17/20  2:22 PM  Result Value Ref Range   Glucose-Capillary 123 (H) 70 - 99 mg/dL  Glucose, capillary     Status: Abnormal   Collection Time: 06/17/20  4:50 PM  Result Value Ref Range   Glucose-Capillary 148 (H) 70 - 99 mg/dL  Hemoglobin A1c     Status: Abnormal   Collection Time: 06/17/20  6:11 PM  Result Value Ref Range   Hgb A1c MFr Bld 6.9 (H) 4.8 - 5.6 %   Mean Plasma Glucose 151.33 mg/dL  Glucose, capillary     Status: Abnormal   Collection Time: 06/17/20  9:14 PM  Result Value Ref Range   Glucose-Capillary 228 (H) 70 - 99 mg/dL  Glucose, capillary     Status: Abnormal   Collection Time: 06/17/20 11:10 PM  Result Value Ref Range   Glucose-Capillary 177 (H) 70 - 99 mg/dL   Comment 1 Notify RN    Comment 2 Document in Chart   CBC     Status: Abnormal   Collection Time: 06/18/20  5:02 AM  Result Value Ref Range   WBC 17.2 (H) 4.0 - 10.5 K/uL   RBC 4.00 3.87 - 5.11 MIL/uL   Hemoglobin 11.3 (L) 12.0 - 15.0 g/dL   HCT 35.6 (L) 36.0 - 46.0 %   MCV  89.0 80.0 - 100.0 fL   MCH 28.3 26.0 - 34.0 pg   MCHC 31.7 30.0 - 36.0 g/dL   RDW 14.1 11.5 - 15.5 %   Platelets 283 150 - 400 K/uL   nRBC 0.0 0.0 - 0.2 %  Basic metabolic panel     Status: Abnormal   Collection Time: 06/18/20  5:02 AM  Result Value Ref Range   Sodium 137 135 - 145 mmol/L   Potassium 5.0 3.5 - 5.1 mmol/L   Chloride 104 98 - 111 mmol/L   CO2 28 22 - 32 mmol/L   Glucose, Bld 161 (H) 70 - 99 mg/dL   BUN 18 8 - 23 mg/dL   Creatinine, Ser 0.99 0.44 - 1.00 mg/dL   Calcium 8.4 (L) 8.9 - 10.3 mg/dL   GFR, Estimated 59 (L) >60 mL/min   Anion gap 5 5 - 15  Glucose, capillary     Status: Abnormal   Collection Time: 06/18/20  8:09 AM  Result Value Ref Range   Glucose-Capillary 136 (H) 70 - 99 mg/dL    DG Knee Left Port  Result Date: 06/17/2020 CLINICAL DATA:  Status post total knee replacement. EXAM: PORTABLE LEFT KNEE - 1-2 VIEW COMPARISON:  03/09/2019 FINDINGS: Two views of the left knee demonstrate a total knee arthroplasty. The knee is located without a periprosthetic fracture. There is expected gas in the soft tissues and suprapatellar region. Skin staples along the anterior knee. 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 No complicating features. Electronically Signed   By: Markus Daft M.D.   On: 06/17/2020 15:54   [place] OR NERVE BLOCK-IMAGE ONLY [facility])  Result Date: 06/17/2020 There is no interpretation for this exam.  This order is for images obtained during a surgical procedure.  Please See "Surgeries" Tab for more information regarding the procedure.    Assessment/Plan: 1 Day Post-Op   Active Problems:   S/P TKR (total knee replacement) using cement, left   Advance diet Up with therapy  Discharge home today if PT goals met Follow up in 2 weeks in office for staple removal.  Call office to confirm.   Carlynn Spry , PA-C 06/18/2020, 8:20 AM

## 2020-06-18 NOTE — Progress Notes (Addendum)
Pt d/c home via private vehicle with son this AM.  D/c paperwork was reviewed with pt and son and they expressed understanding.  All belongings taken at time of d/c including BSC and RW, VS WNL and NAD noted.  Polar care and all personal belongings were taken.  Pt was wheeled to med mall and assisted into car.  IV was removed from pts R forearm without issue.

## 2020-06-18 NOTE — Progress Notes (Signed)
Physical Therapy Treatment Patient Details Name: Julie Keith MRN: [id number] DOB: 1946-04-09 Today's Date: 06/18/2020    History of Present Illness Pt admitted for L TKR. Pt is POD 0 at time of surgery    PT Comments    Pt is making good progress and safe to dc home this date. Progress with ambulation distance demonstrating reciprocal gait pattern. Reports no pain and safely performed stair training. Good endurance with HEP. O2 sats decrease with exertion on RA. Updated care team.    Follow Up Recommendations  Home health PT     Equipment Recommendations   (received equipment)    Recommendations for Other Services       Precautions / Restrictions Precautions Precautions: Fall;Knee Precaution Booklet Issued: Yes (comment) Restrictions Weight Bearing Restrictions: Yes LLE Weight Bearing: Weight bearing as tolerated    Mobility  Bed Mobility Overal bed mobility: Needs Assistance Bed Mobility: Supine to Sit     Supine to sit: Min guard     General bed mobility comments: safe technique and follows commands well    Transfers Overall transfer level: Needs assistance Equipment used: Rolling walker (2 wheeled) Transfers: Sit to/from Stand Sit to Stand: Min guard         General transfer comment: cues for hand placement. Needs min assist from bed level, however able to improve to cga from firm surface  Ambulation/Gait Ambulation/Gait assistance: Min guard Gait Distance (Feet): 260 Feet Assistive device: Rolling walker (2 wheeled) Gait Pattern/deviations: Step-through pattern Gait velocity: 10' in 11"   General Gait Details: ambulated with improved speed with reciprocal gait pattern. Improved heel strike. With exertion, O2 sats decreased to 73% on RA. Added 3L of O2 with improvement to 94%. Further ambulation on 3L of O2 with sats WNL   Stairs Stairs: Yes Stairs assistance: Min guard Stair Management: One rail Right;Forwards Number of Stairs: 4 General  stair comments: up/down 4 stairs with safe technique. Demonstrated prior to performance. Step to pattern   Wheelchair Mobility    Modified Rankin (Stroke Patients Only)       Balance Overall balance assessment: Needs assistance Sitting-balance support: Feet supported Sitting balance-Leahy Scale: Good     Standing balance support: Bilateral upper extremity supported Standing balance-Leahy Scale: Good                              Cognition Arousal/Alertness: Awake/alert Behavior During Therapy: WFL for tasks assessed/performed Overall Cognitive Status: Within Functional Limits for tasks assessed                                        Exercises Total Joint Exercises Goniometric ROM: L knee AAROM: 1-90 degrees Other Exercises Other Exercises: supine ther-0ex performed on L LE including AP, QS, SLRs, and hip abd/add. 12 reps with cues for sequencing. Written HEP given and reviewed    General Comments        Pertinent Vitals/Pain Pain Assessment: No/denies pain    Home Living                      Prior Function            PT Goals (current goals can now be found in the care plan section) Acute Rehab PT Goals Patient Stated Goal: to go home PT Goal Formulation: With patient Time For Goal  Achievement: 07/01/20 Potential to Achieve Goals: Good Progress towards PT goals: Progressing toward goals    Frequency    BID      PT Plan Current plan remains appropriate    Co-evaluation              AM-PAC PT "6 Clicks" Mobility   Outcome Measure  Help needed turning from your back to your side while in a flat bed without using bedrails?: A Little Help needed moving from lying on your back to sitting on the side of a flat bed without using bedrails?: A Little Help needed moving to and from a bed to a chair (including a wheelchair)?: A Little Help needed standing up from a chair using your arms (e.g., wheelchair or bedside  chair)?: A Little Help needed to walk in hospital room?: A Little Help needed climbing 3-5 steps with a railing? : A Little 6 Click Score: 18    End of Session Equipment Utilized During Treatment: Gait belt Activity Tolerance: Patient tolerated treatment well Patient left: in chair;with chair alarm set;with family/visitor present Nurse Communication: Mobility status PT Visit Diagnosis: Unsteadiness on feet (R26.81);Muscle weakness (generalized) (M62.81);Difficulty in walking, not elsewhere classified (R26.2);Pain Pain - Right/Left: Left Pain - part of body: Knee     Time: 2542-7062 PT Time Calculation (min) (ACUTE ONLY): 53 min  Charges:  $Gait Training: 23-37 mins $Therapeutic Exercise: 23-37 mins                     Elizabeth Palau, PT, DPT [phone]    Ray,Stephanie 06/18/2020, 10:16 AM

## 2020-06-20 DIAGNOSIS — E119 Type 2 diabetes mellitus without complications: Secondary | ICD-10-CM | POA: Diagnosis not present

## 2020-06-20 DIAGNOSIS — Z7984 Long term (current) use of oral hypoglycemic drugs: Secondary | ICD-10-CM | POA: Diagnosis not present

## 2020-06-20 DIAGNOSIS — J309 Allergic rhinitis, unspecified: Secondary | ICD-10-CM | POA: Diagnosis not present

## 2020-06-20 DIAGNOSIS — L409 Psoriasis, unspecified: Secondary | ICD-10-CM | POA: Diagnosis not present

## 2020-06-20 DIAGNOSIS — J439 Emphysema, unspecified: Secondary | ICD-10-CM | POA: Diagnosis not present

## 2020-06-20 DIAGNOSIS — Z9981 Dependence on supplemental oxygen: Secondary | ICD-10-CM | POA: Diagnosis not present

## 2020-06-20 DIAGNOSIS — K219 Gastro-esophageal reflux disease without esophagitis: Secondary | ICD-10-CM | POA: Diagnosis not present

## 2020-06-20 DIAGNOSIS — Z96652 Presence of left artificial knee joint: Secondary | ICD-10-CM | POA: Diagnosis not present

## 2020-06-20 DIAGNOSIS — Z471 Aftercare following joint replacement surgery: Secondary | ICD-10-CM | POA: Diagnosis not present

## 2020-06-20 DIAGNOSIS — I10 Essential (primary) hypertension: Secondary | ICD-10-CM | POA: Diagnosis not present

## 2020-06-20 DIAGNOSIS — Z87891 Personal history of nicotine dependence: Secondary | ICD-10-CM | POA: Diagnosis not present

## 2020-06-20 DIAGNOSIS — Z9181 History of falling: Secondary | ICD-10-CM | POA: Diagnosis not present

## 2020-06-20 DIAGNOSIS — Z6834 Body mass index (BMI) 34.0-34.9, adult: Secondary | ICD-10-CM | POA: Diagnosis not present

## 2020-06-20 DIAGNOSIS — E785 Hyperlipidemia, unspecified: Secondary | ICD-10-CM | POA: Diagnosis not present

## 2020-07-04 ENCOUNTER — Encounter: Payer: Self-pay | Admitting: Orthopedic Surgery

## 2020-07-04 NOTE — H&P (Signed)
Julie Keith MRN:  [id number] DOB/SEX:  [birth year]/female  CHIEF COMPLAINT:  Painful left Knee  HISTORY: Patient is a 75 y.o. female presented with a history of pain in the left knee. Onset of symptoms was gradual starting several years ago with gradually worsening course since that time. Prior procedures on the knee include none. Patient has been treated conservatively with over-the-counter NSAIDs and activity modification. Patient currently rates pain in the knee at 10 out of 10 with activity. There is pain at night.  PAST MEDICAL HISTORY: Patient Active Problem List   Diagnosis Date Noted  . S/P TKR (total knee replacement) using cement, left 06/17/2020  . Osteoarthritis of left knee 02/28/2020  . Stage 3 chronic kidney disease (HCC) 09/21/2019  . Psoriasis 10/10/2015  . GERD (gastroesophageal reflux disease) 10/10/2015  . Type 2 diabetes mellitus with diabetic nephropathy, without long-term current use of insulin (HCC) 10/31/2014  . Essential hypertension 10/31/2014  . Hyperlipidemia 10/31/2014  . Morbid obesity (HCC) 10/31/2014  . COPD (chronic obstructive pulmonary disease) (HCC) 06/28/2014   Past Medical History:  Diagnosis Date  . Allergic rhinitis   . Diabetes ([facility])   . Dyspnea   . Emphysema lung ([facility])   . GERD (gastroesophageal reflux disease) 10/10/2015  . Hx of tobacco use, presenting hazards to health 04/08/2016  . Hypercholesteremia   . Hypertension   . Psoriasis 10/10/2015   Past Surgical History:  Procedure Laterality Date  . ABDOMINAL HYSTERECTOMY    . CERVICAL SPINE SURGERY    . CHOLECYSTECTOMY    . MASTECTOMY Bilateral   . SPINAL CORD STIMULATOR BATTERY EXCHANGE     Osteostimulator in back  . TOTAL KNEE ARTHROPLASTY Left 06/17/2020   Procedure: TOTAL KNEE ARTHROPLASTY;  Surgeon: Lyndle Herrlich, MD;  Location: [facility] ORS;  Service: Orthopedics;  Laterality: Left;     MEDICATIONS:  (Not in a hospital admission)   ALLERGIES:  No Known Allergies  REVIEW  OF SYSTEMS:  Pertinent items noted in HPI and remainder of comprehensive ROS otherwise negative.   FAMILY HISTORY:   Family History  Problem Relation Age of Onset  . Leukemia Mother   . Pneumonia Father   . COPD Father   . Diabetes Brother   . Cancer Brother        lung cancer  . Diabetes Sister     SOCIAL HISTORY:   Social History   Tobacco Use  . Smoking status: Former Smoker    Packs/day: 2.00    Years: 30.00    Pack years: 60.00    Types: Cigarettes  . Smokeless tobacco: Never Used  . Tobacco comment: quit 2006  Substance Use Topics  . Alcohol use: No    Alcohol/week: 0.0 standard drinks     EXAMINATION:  Vital signs in last 24 hours: @VSRANGES @  General appearance: alert, cooperative and no distress Throat: lips, mucosa, and tongue normal; teeth and gums normal Lungs: clear to auscultation bilaterally Heart: regular rate and rhythm, S1, S2 normal, no murmur, click, rub or gallop Abdomen: soft, non-tender; bowel sounds normal; no masses,  no organomegaly Extremities: extremities normal, atraumatic, no cyanosis or edema and Homans sign is negative, no sign of DVT Pulses: 2+ and symmetric Skin: Skin color, texture, turgor normal. No rashes or lesions Neurologic: Alert and oriented X 3, normal strength and tone. Normal symmetric reflexes. Normal coordination and gait  Musculoskeletal:  ROM 0-110, Ligaments intact,  Imaging Review Plain radiographs demonstrate severe degenerative joint disease of the left knee. The overall  alignment is significant varus. The bone quality appears to be good for age and reported activity level.  Assessment/Plan: Primary osteoarthritis, left knee   The patient history, physical examination and imaging studies are consistent with advanced degenerative joint disease of the left knee. The patient has failed conservative treatment.  The clearance notes were reviewed.  After discussion with the patient it was felt that Total Knee  Replacement was indicated. The procedure,  risks, and benefits of total knee arthroplasty were presented and reviewed. The risks including but not limited to aseptic loosening, infection, blood clots, vascular injury, stiffness, patella tracking problems complications among others were discussed. The patient acknowledged the explanation, agreed to proceed with the plan.  [name] 07/04/2020, 7:04 AM

## 2020-07-15 DIAGNOSIS — J449 Chronic obstructive pulmonary disease, unspecified: Secondary | ICD-10-CM | POA: Diagnosis not present

## 2020-07-16 ENCOUNTER — Ambulatory Visit: Payer: Self-pay

## 2020-07-16 NOTE — Telephone Encounter (Signed)
Pt. Reports she started coughing yesterday and has "post nasal drip." Mild shortness of breath. States "I don't want to let it go because I have COPD." Virtual visit made for tomorrow.  Reason for Disposition . [1] MILD difficulty breathing (e.g., minimal/no SOB at rest, SOB with walking, pulse <100) AND [2] still present when not coughing  Answer Assessment - Initial Assessment Questions 1. ONSET: "When did the cough begin?"      Yesterday 2. SEVERITY: "How bad is the cough today?"      Moderate 3. SPUTUM: "Describe the color of your sputum" (none, dry cough; clear, white, yellow, green)     None 4. HEMOPTYSIS: "Are you coughing up any blood?" If so ask: "How much?" (flecks, streaks, tablespoons, etc.)     No 5. DIFFICULTY BREATHING: "Are you having difficulty breathing?" If Yes, ask: "How bad is it?" (e.g., mild, moderate, severe)    - MILD: No SOB at rest, mild SOB with walking, speaks normally in sentences, can lay down, no retractions, pulse < 100.    - MODERATE: SOB at rest, SOB with minimal exertion and prefers to sit, cannot lie down flat, speaks in phrases, mild retractions, audible wheezing, pulse 100-120.    - SEVERE: Very SOB at rest, speaks in single words, struggling to breathe, sitting hunched forward, retractions, pulse > 120      Mild 6. FEVER: "Do you have a fever?" If Yes, ask: "What is your temperature, how was it measured, and when did it start?"     No 7. CARDIAC HISTORY: "Do you have any history of heart disease?" (e.g., heart attack, congestive heart failure)      No 8. LUNG HISTORY: "Do you have any history of lung disease?"  (e.g., pulmonary embolus, asthma, emphysema)     COPD 9. PE RISK FACTORS: "Do you have a history of blood clots?" (or: recent major surgery, recent prolonged travel, bedridden)     No 10. OTHER SYMPTOMS: "Do you have any other symptoms?" (e.g., runny nose, wheezing, chest pain)       Runny nose 11. PREGNANCY: "Is there any chance you are  pregnant?" "When was your last menstrual period?"       No 12. TRAVEL: "Have you traveled out of the country in the last month?" (e.g., travel history, exposures)       No  Protocols used: COUGH - ACUTE NON-PRODUCTIVE-A-AH

## 2020-07-16 NOTE — Telephone Encounter (Signed)
FYI

## 2020-07-17 ENCOUNTER — Other Ambulatory Visit: Payer: Self-pay

## 2020-07-17 ENCOUNTER — Encounter: Payer: Self-pay | Admitting: Family Medicine

## 2020-07-17 ENCOUNTER — Telehealth (INDEPENDENT_AMBULATORY_CARE_PROVIDER_SITE_OTHER): Payer: Medicare Other | Admitting: Family Medicine

## 2020-07-17 DIAGNOSIS — E119 Type 2 diabetes mellitus without complications: Secondary | ICD-10-CM

## 2020-07-17 DIAGNOSIS — J441 Chronic obstructive pulmonary disease with (acute) exacerbation: Secondary | ICD-10-CM | POA: Diagnosis not present

## 2020-07-17 DIAGNOSIS — J431 Panlobular emphysema: Secondary | ICD-10-CM | POA: Diagnosis not present

## 2020-07-17 DIAGNOSIS — J302 Other seasonal allergic rhinitis: Secondary | ICD-10-CM

## 2020-07-17 MED ORDER — IPRATROPIUM-ALBUTEROL 0.5-2.5 (3) MG/3ML IN SOLN: 3.0000 mL | Freq: Three times a day (TID) | RESPIRATORY_TRACT | 0 refills | Status: DC | PRN

## 2020-07-17 MED ORDER — BENZONATATE 100 MG PO CAPS: 100.0000 mg | ORAL_CAPSULE | Freq: Three times a day (TID) | ORAL | 1 refills | Status: DC | PRN

## 2020-07-17 MED ORDER — AZITHROMYCIN 250 MG PO TABS: 250.0000 mg | ORAL_TABLET | Freq: Every day | ORAL | 0 refills | Status: DC

## 2020-07-17 MED ORDER — LORATADINE 10 MG PO TABS: 10.0000 mg | ORAL_TABLET | Freq: Every day | ORAL | 11 refills | Status: DC

## 2020-07-17 MED ORDER — ALBUTEROL SULFATE HFA 108 (90 BASE) MCG/ACT IN AERS: 2.0000 | INHALATION_SPRAY | Freq: Four times a day (QID) | RESPIRATORY_TRACT | 2 refills | Status: DC | PRN

## 2020-07-17 MED ORDER — PREDNISONE 20 MG PO TABS: ORAL_TABLET | ORAL | 0 refills | Status: DC

## 2020-07-17 NOTE — Progress Notes (Signed)
Name: Julie Keith   MRN: [id number]    DOB: [birth year]   Date:07/17/2020       Progress Note  Subjective:   Chief Complaint  Chief Complaint  Patient presents with  . URI    Cough, congestion, drainage   I connected with  Julie Keith on 07/17/20 at  3:00 PM EDT by telephone and verified that I am speaking with the correct person using two identifiers.   I discussed the limitations, risks, security and privacy concerns of performing an evaluation and management service by telephone and the availability of in person appointments. Staff also discussed with the patient that there may be a patient responsible charge related to this service.  Patient verbalized understanding and agreed to proceed with encounter. Patient Location: home Provider Location:  [facility] clinic Additional Individuals present:   HPI  Pt presents via telephone for URI sx with hx of emphysema   Sneezing and coughing a lot with allergies -  Allergies started a couple days ago, hoarse voice, a lot of drainage   Emphysema-she is lost weight and is more active, has less exertional shortness of breath, continues to use her oxygen only while sleeping, has budesonide and albuterol inhalers but does not use regularly and has not used recently  A little tight in her chest, cough is not productive, mildly SOB, she has not used her albuterol inhaler or nebulizer, not on allergy meds, eyes are runny and itchy        Patient Active Problem List   Diagnosis Date Noted  . S/P TKR (total knee replacement) using cement, left 06/17/2020  . Osteoarthritis of left knee 02/28/2020  . Stage 3 chronic kidney disease (HCC) 09/21/2019  . Psoriasis 10/10/2015  . GERD (gastroesophageal reflux disease) 10/10/2015  . Type 2 diabetes mellitus with diabetic nephropathy, without long-term current use of insulin (HCC) 10/31/2014  . Essential hypertension 10/31/2014  . Hyperlipidemia 10/31/2014  . Morbid obesity (HCC) 10/31/2014  . COPD  (chronic obstructive pulmonary disease) (HCC) 06/28/2014    Social History   Tobacco Use  . Smoking status: Former Smoker    Packs/day: 2.00    Years: 30.00    Pack years: 60.00    Types: Cigarettes  . Smokeless tobacco: Never Used  . Tobacco comment: quit 2006  Substance Use Topics  . Alcohol use: No    Alcohol/week: 0.0 standard drinks     Current Outpatient Medications:  .  albuterol (PROVENTIL HFA;VENTOLIN HFA) 108 (90 Base) MCG/ACT inhaler, Inhale 2 puffs into the lungs every 6 (six) hours as needed for wheezing or shortness of breath., Disp: , Rfl:  .  aspirin 81 MG chewable tablet, Chew 1 tablet (81 mg total) by mouth 2 (two) times daily., Disp: 60 tablet, Rfl: 0 .  budesonide (PULMICORT) 0.5 MG/2ML nebulizer solution, Take 0.5 mg by nebulization every 6 (six) hours as needed (asthma)., Disp: , Rfl:  .  docusate sodium (COLACE) 100 MG capsule, Take 1 capsule (100 mg total) by mouth 2 (two) times daily., Disp: 30 capsule, Rfl: 0 .  enalapril (VASOTEC) 20 MG tablet, Take 1 tablet (20 mg total) by mouth 2 (two) times daily., Disp: 180 tablet, Rfl: 1 .  glimepiride (AMARYL) 4 MG tablet, Take 1 tablet (4 mg total) by mouth daily with breakfast., Disp: 90 tablet, Rfl: 1 .  HYDROcodone-acetaminophen (NORCO/VICODIN) 5-325 MG tablet, Take 1 tablet by mouth every 4 (four) hours as needed for moderate pain., Disp: 30 tablet, Rfl: 0 .  methocarbamol (ROBAXIN) 500 MG tablet, Take 1 tablet (500 mg total) by mouth every 8 (eight) hours as needed for muscle spasms., Disp: 60 tablet, Rfl: 1 .  metoprolol succinate (TOPROL-XL) 25 MG 24 hr tablet, Take 1 tablet (25 mg total) by mouth daily., Disp: 90 tablet, Rfl: 3 .  OneTouch Delica Lancets 33G MISC, 1 Bottle by Does not apply route in the morning, at noon, and at bedtime., Disp: 100 each, Rfl: 11 .  pantoprazole (PROTONIX) 20 MG tablet, Take 1 tablet (20 mg total) by mouth daily., Disp: 90 tablet, Rfl: 1 .  pravastatin (PRAVACHOL) 80 MG tablet,  Take 1 tablet (80 mg total) by mouth daily. (Patient taking differently: Take 80 mg by mouth every evening.), Disp: 90 tablet, Rfl: 1  No Known Allergies  Chart Review: I personally reviewed active problem list, medication list, allergies, family history, social history, health maintenance, notes from last encounter, lab results, imaging with the patient/caregiver today.   Review of Systems  Constitutional: Negative.  Negative for activity change, appetite change, chills, diaphoresis, fatigue and fever.  HENT: Negative.   Eyes: Negative.   Respiratory: Negative.   Cardiovascular: Negative.  Negative for chest pain, palpitations and leg swelling.  Gastrointestinal: Negative.   Endocrine: Negative.   Genitourinary: Negative.   Musculoskeletal: Negative.   Skin: Negative.   Allergic/Immunologic: Negative.   Neurological: Negative.   Hematological: Negative.   Psychiatric/Behavioral: Negative.   All other systems reviewed and are negative.    Objective:    Virtual encounter, vitals limited, only able to obtain the following There were no vitals filed for this visit. There is no height or weight on file to calculate BMI. Nursing Note and Vital Signs reviewed.  Physical Exam Vitals and nursing note reviewed.  Constitutional:      General: She is not in acute distress. Pulmonary:     Effort: No respiratory distress.     Comments: intermittent coughing, no audible wheeze or stridor Able to speak in full and complete sentences  Neurological:     Mental Status: She is alert.  Psychiatric:        Mood and Affect: Mood normal.     PE limited by telephone encounter  No results found for this or any previous visit (from the past 72 hour(s)).  Assessment and Plan:     ICD-10-CM   1. COPD with acute exacerbation (HCC)  J44.1 azithromycin (ZITHROMAX Z-PAK) 250 MG tablet    benzonatate (TESSALON) 100 MG capsule    predniSONE (DELTASONE) 20 MG tablet    ipratropium-albuterol  (DUONEB) 0.5-2.5 (3) MG/3ML SOLN    albuterol (VENTOLIN HFA) 108 (90 Base) MCG/ACT inhaler   refill on nebs and inhalers, try to manage allergies first and use inhalers, if not improving pt instructed to start prednisone and zpak and continue allergy tx  2. Seasonal allergies  J30.2 loratadine (CLARITIN) 10 MG tablet   affecting eyes, sinuses, post-nasal drip, hoarse voice, causing cough/wheeze/sob - start daily allergy meds - continue for at least the next month   3. Type 2 diabetes mellitus without complication, without long-term current use of insulin (HCC)  E11.9    higher risk pt - DM and last A1C near goal, no recently hyperglycemia - monitoring  4. Panlobular emphysema (HCC)  J43.1    sig past lung hx, recently improving and not on daily meds - see above for management of exacerbation likely due to spring allergies   5. Morbid obesity (HCC)  E66.01  higher risk pt with DM, HLD, HTN,. COPD    Son picking up meds - asked pt to explain to him to get OTC allergy meds if not covered by insurance - zyrtec, claritin, allegra or generic OTC equivalent okay to take one at bedtime daily  -Red flags and when to present for emergency care or RTC including but not limited to new/worsening/un-resolving symptoms, reviewed with patient at time of visit. Follow up and care instructions discussed and provided in AVS. - I discussed the assessment and treatment plan with the patient. The patient was provided an opportunity to ask questions and all were answered. The patient agreed with the plan and demonstrated an understanding of the instructions.  - The patient was advised to call back or seek an in-person evaluation if the symptoms worsen or if the condition fails to improve as anticipated.  If not starting to improving in 5-7 d return for in person eval Go to UC or ER if any sudden worsening   I provided 20+ minutes of non-face-to-face time during this encounter.  Danelle Berry, PA-C 07/17/20 3:37  PM

## 2020-07-22 DIAGNOSIS — E785 Hyperlipidemia, unspecified: Secondary | ICD-10-CM | POA: Diagnosis not present

## 2020-07-22 DIAGNOSIS — J439 Emphysema, unspecified: Secondary | ICD-10-CM | POA: Diagnosis not present

## 2020-07-22 DIAGNOSIS — Z9981 Dependence on supplemental oxygen: Secondary | ICD-10-CM | POA: Diagnosis not present

## 2020-07-22 DIAGNOSIS — E119 Type 2 diabetes mellitus without complications: Secondary | ICD-10-CM | POA: Diagnosis not present

## 2020-07-22 DIAGNOSIS — Z6834 Body mass index (BMI) 34.0-34.9, adult: Secondary | ICD-10-CM | POA: Diagnosis not present

## 2020-07-22 DIAGNOSIS — Z7984 Long term (current) use of oral hypoglycemic drugs: Secondary | ICD-10-CM | POA: Diagnosis not present

## 2020-07-22 DIAGNOSIS — Z471 Aftercare following joint replacement surgery: Secondary | ICD-10-CM | POA: Diagnosis not present

## 2020-07-22 DIAGNOSIS — I10 Essential (primary) hypertension: Secondary | ICD-10-CM | POA: Diagnosis not present

## 2020-07-22 DIAGNOSIS — J309 Allergic rhinitis, unspecified: Secondary | ICD-10-CM | POA: Diagnosis not present

## 2020-07-22 DIAGNOSIS — Z87891 Personal history of nicotine dependence: Secondary | ICD-10-CM | POA: Diagnosis not present

## 2020-07-22 DIAGNOSIS — L409 Psoriasis, unspecified: Secondary | ICD-10-CM | POA: Diagnosis not present

## 2020-07-22 DIAGNOSIS — Z9181 History of falling: Secondary | ICD-10-CM | POA: Diagnosis not present

## 2020-07-22 DIAGNOSIS — Z96652 Presence of left artificial knee joint: Secondary | ICD-10-CM | POA: Diagnosis not present

## 2020-07-22 DIAGNOSIS — K219 Gastro-esophageal reflux disease without esophagitis: Secondary | ICD-10-CM | POA: Diagnosis not present

## 2020-07-30 DIAGNOSIS — M1712 Unilateral primary osteoarthritis, left knee: Secondary | ICD-10-CM | POA: Diagnosis not present

## 2020-07-30 DIAGNOSIS — Z96652 Presence of left artificial knee joint: Secondary | ICD-10-CM | POA: Diagnosis not present

## 2020-08-12 DIAGNOSIS — Z79899 Other long term (current) drug therapy: Secondary | ICD-10-CM | POA: Diagnosis not present

## 2020-08-12 DIAGNOSIS — L400 Psoriasis vulgaris: Secondary | ICD-10-CM | POA: Diagnosis not present

## 2020-08-12 DIAGNOSIS — L281 Prurigo nodularis: Secondary | ICD-10-CM | POA: Diagnosis not present

## 2020-08-15 DIAGNOSIS — J449 Chronic obstructive pulmonary disease, unspecified: Secondary | ICD-10-CM | POA: Diagnosis not present

## 2020-08-20 DIAGNOSIS — M25662 Stiffness of left knee, not elsewhere classified: Secondary | ICD-10-CM | POA: Diagnosis not present

## 2020-08-20 DIAGNOSIS — M25562 Pain in left knee: Secondary | ICD-10-CM | POA: Diagnosis not present

## 2020-08-21 DIAGNOSIS — M25662 Stiffness of left knee, not elsewhere classified: Secondary | ICD-10-CM | POA: Diagnosis not present

## 2020-08-21 DIAGNOSIS — Z96652 Presence of left artificial knee joint: Secondary | ICD-10-CM | POA: Diagnosis not present

## 2020-08-21 DIAGNOSIS — M25562 Pain in left knee: Secondary | ICD-10-CM | POA: Diagnosis not present

## 2020-09-14 DIAGNOSIS — J449 Chronic obstructive pulmonary disease, unspecified: Secondary | ICD-10-CM | POA: Diagnosis not present

## 2020-10-08 DIAGNOSIS — M25662 Stiffness of left knee, not elsewhere classified: Secondary | ICD-10-CM | POA: Diagnosis not present

## 2020-10-08 DIAGNOSIS — Z96652 Presence of left artificial knee joint: Secondary | ICD-10-CM | POA: Diagnosis not present

## 2020-10-09 ENCOUNTER — Encounter: Payer: Self-pay | Admitting: Orthopedic Surgery

## 2020-10-09 ENCOUNTER — Other Ambulatory Visit: Payer: Self-pay | Admitting: Orthopedic Surgery

## 2020-10-09 NOTE — H&P (Unsigned)
Julie Keith MRN:  [id number] DOB/SEX:  May 24, 1945/female  CHIEF COMPLAINT:  Painful left Knee, stiff  HISTORY: Patient is a 76 y.o. female presented with a history of pain in the left knee. Patient is several months out from a total knee arthroplasty with increasing stiffness and decreasing ROM.      PAST MEDICAL HISTORY: Patient Active Problem List   Diagnosis Date Noted   S/P TKR (total knee replacement) using cement, left 06/17/2020   Osteoarthritis of left knee 02/28/2020   Stage 3 chronic kidney disease (HCC) 09/21/2019   Psoriasis 10/10/2015   GERD (gastroesophageal reflux disease) 10/10/2015   Type 2 diabetes mellitus with diabetic nephropathy, without long-term current use of insulin (HCC) 10/31/2014   Essential hypertension 10/31/2014   Hyperlipidemia 10/31/2014   Morbid obesity (HCC) 10/31/2014   COPD (chronic obstructive pulmonary disease) (HCC) 06/28/2014   Past Medical History:  Diagnosis Date   Allergic rhinitis    Diabetes (HCC)    Dyspnea    Emphysema lung (HCC)    GERD (gastroesophageal reflux disease) 10/10/2015   Hx of tobacco use, presenting hazards to health 04/08/2016   Hypercholesteremia    Hypertension    Psoriasis 10/10/2015   Past Surgical History:  Procedure Laterality Date   ABDOMINAL HYSTERECTOMY     CERVICAL SPINE SURGERY     CHOLECYSTECTOMY     MASTECTOMY Bilateral    SPINAL CORD STIMULATOR BATTERY EXCHANGE     Osteostimulator in back   TOTAL KNEE ARTHROPLASTY Left 06/17/2020   Procedure: TOTAL KNEE ARTHROPLASTY;  Surgeon: Lyndle Herrlich, MD;  Location: [facility] ORS;  Service: Orthopedics;  Laterality: Left;     MEDICATIONS:  (Not in a hospital admission)   ALLERGIES:  No Known Allergies  REVIEW OF SYSTEMS:  Pertinent items are noted in HPI.   FAMILY HISTORY:   Family History  Problem Relation Age of Onset   Leukemia Mother    Pneumonia Father    COPD Father    Diabetes Brother    Cancer Brother        lung cancer    Diabetes Sister     SOCIAL HISTORY:   Social History   Tobacco Use   Smoking status: Former    Packs/day: 2.00    Years: 30.00    Pack years: 60.00    Types: Cigarettes   Smokeless tobacco: Never   Tobacco comments:    quit 2006  Substance Use Topics   Alcohol use: No    Alcohol/week: 0.0 standard drinks     EXAMINATION:  Vital signs in last 24 hours: @VSRANGES @  General appearance: alert, cooperative, and no distress Neck: no JVD and supple, symmetrical, trachea midline Lungs: clear to auscultation bilaterally Heart: regular rate and rhythm, S1, S2 normal, no murmur, click, rub or gallop Abdomen: soft, non-tender; bowel sounds normal; no masses,  no organomegaly Extremities: extremities normal, atraumatic, no cyanosis or edema and Homans sign is negative, no sign of DVT Pulses: 2+ and symmetric Skin: Skin color, texture, turgor normal. No rashes or lesions Neurologic: Alert and oriented X 3, normal strength and tone. Normal symmetric reflexes. Normal coordination and gait  Musculoskeletal:  ROM 5-80, Ligaments intact,  Imaging Review Plain radiographs demonstrate components in good alignment from TKA  Assessment/Plan: Left knee stiffness after TKA  Left knee manipulation under anesthesia  10/09/2020, 4:39 PM

## 2020-10-10 ENCOUNTER — Encounter
Admission: RE | Admit: 2020-10-10 | Discharge: 2020-10-10 | Disposition: A | Discharge status: Home / Self Care | Payer: Medicare Other | Source: Ambulatory Visit | Attending: Orthopedic Surgery | Admitting: Orthopedic Surgery

## 2020-10-10 ENCOUNTER — Other Ambulatory Visit: Payer: Self-pay

## 2020-10-10 NOTE — Patient Instructions (Signed)
Your procedure is scheduled on: 10/14/20 Report to DAY SURGERY DEPARTMENT LOCATED ON 2ND FLOOR MEDICAL MALL ENTRANCE. To find out your arrival time please call [phone] between 1PM - 3PM on 10/11/20.  Remember: Instructions that are not followed completely may result in serious medical risk, up to and including death, or upon the discretion of your surgeon and anesthesiologist your surgery may need to be rescheduled.     _X__ 1. Do not eat food after midnight the night before your procedure.                 No gum chewing or hard candies. You may drink clear liquids up to 2 hours                 before you are scheduled to arrive for your surgery- DO not drink clear                 liquids within 2 hours of the start of your surgery.                 Clear Liquids include:  water, apple juice without pulp, clear carbohydrate                 drink such as Clearfast or Gatorade, Black Coffee or Tea (Do not add                 anything to coffee or tea). Diabetics water only  __X__2.  On the morning of surgery brush your teeth with toothpaste and water, you                 may rinse your mouth with mouthwash if you wish.  Do not swallow any              toothpaste of mouthwash.     _X__ 3.  No Alcohol for 24 hours before or after surgery.   _X__ 4.  Do Not Smoke or use e-cigarettes For 24 Hours Prior to Your Surgery.                 Do not use any chewable tobacco products for at least 6 hours prior to                 surgery.  ____  5.  Bring all medications with you on the day of surgery if instructed.   __X__  6.  Notify your doctor if there is any change in your medical condition      (cold, fever, infections).     Do not wear jewelry, make-up, hairpins, clips or nail polish. Do not wear lotions, powders, or perfumes.  Do not shave 48 hours prior to surgery. Men may shave face and neck. Do not bring valuables to the hospital.    [facility] is not responsible for any belongings or  valuables.  Contacts, dentures/partials or body piercings may not be worn into surgery. Bring a case for your contacts, glasses or hearing aids, a denture cup will be supplied. Leave your suitcase in the car. After surgery it may be brought to your room. For patients admitted to the hospital, discharge time is determined by your treatment team.   Patients discharged the day of surgery will not be allowed to drive home.   Please read over the following fact sheets that you were given:   MRSA Information  __X__ Take these medicines the morning of surgery with A SIP OF WATER:  1. metoprolol succinate (TOPROL-XL) 25 MG 24 hr tablet  2. pantoprazole (PROTONIX) 20 MG tablet  3.   4.  5.  6.  ____ Fleet Enema (as directed)   ____ Use CHG Soap/SAGE wipes as directed  __X__ Use NEBULIZER on the day of surgery    ____ Stop metformin/Janumet/Farxiga 2 days prior to surgery    ____ Take 1/2 of usual insulin dose the night before surgery. No insulin the morning          of surgery.   ____ Stop Blood Thinners Coumadin/Plavix/Xarelto/Pleta/Pradaxa/Eliquis/Effient/Aspirin  on   Or contact your Surgeon, Cardiologist or Medical Doctor regarding  ability to stop your blood thinners  __X__ Stop Anti-inflammatories 7 days before surgery such as Advil, Ibuprofen, Motrin,  BC or Goodies Powder, Naprosyn, Naproxen, Aleve    __X__ Stop all herbal supplements, fish oil or vitamin E until after surgery.    ____ Bring C-Pap to the hospital.

## 2020-10-14 ENCOUNTER — Ambulatory Visit
Admission: RE | Admit: 2020-10-14 | Discharge: 2020-10-14 | Disposition: A | Discharge status: Home / Self Care | Payer: Medicare Other | Attending: Orthopedic Surgery | Admitting: Orthopedic Surgery

## 2020-10-14 ENCOUNTER — Ambulatory Visit: Payer: Medicare Other | Admitting: Certified Registered Nurse Anesthetist

## 2020-10-14 ENCOUNTER — Encounter: Payer: Self-pay | Admitting: Orthopedic Surgery

## 2020-10-14 ENCOUNTER — Encounter
Admission: RE | Disposition: A | Discharge status: Home / Self Care | Payer: Self-pay | Source: Home / Self Care | Attending: Orthopedic Surgery

## 2020-10-14 ENCOUNTER — Ambulatory Visit: Payer: Medicare Other | Admitting: Family Medicine

## 2020-10-14 ENCOUNTER — Other Ambulatory Visit: Payer: Self-pay

## 2020-10-14 DIAGNOSIS — Z9013 Acquired absence of bilateral breasts and nipples: Secondary | ICD-10-CM | POA: Diagnosis not present

## 2020-10-14 DIAGNOSIS — Z90710 Acquired absence of both cervix and uterus: Secondary | ICD-10-CM | POA: Insufficient documentation

## 2020-10-14 DIAGNOSIS — J439 Emphysema, unspecified: Secondary | ICD-10-CM | POA: Diagnosis not present

## 2020-10-14 DIAGNOSIS — Z96652 Presence of left artificial knee joint: Secondary | ICD-10-CM | POA: Diagnosis not present

## 2020-10-14 DIAGNOSIS — E1122 Type 2 diabetes mellitus with diabetic chronic kidney disease: Secondary | ICD-10-CM | POA: Diagnosis not present

## 2020-10-14 DIAGNOSIS — Z6834 Body mass index (BMI) 34.0-34.9, adult: Secondary | ICD-10-CM | POA: Diagnosis not present

## 2020-10-14 DIAGNOSIS — M1712 Unilateral primary osteoarthritis, left knee: Secondary | ICD-10-CM | POA: Insufficient documentation

## 2020-10-14 DIAGNOSIS — Z9049 Acquired absence of other specified parts of digestive tract: Secondary | ICD-10-CM | POA: Diagnosis not present

## 2020-10-14 DIAGNOSIS — N183 Chronic kidney disease, stage 3 unspecified: Secondary | ICD-10-CM | POA: Diagnosis not present

## 2020-10-14 DIAGNOSIS — F17210 Nicotine dependence, cigarettes, uncomplicated: Secondary | ICD-10-CM | POA: Insufficient documentation

## 2020-10-14 DIAGNOSIS — I129 Hypertensive chronic kidney disease with stage 1 through stage 4 chronic kidney disease, or unspecified chronic kidney disease: Secondary | ICD-10-CM | POA: Insufficient documentation

## 2020-10-14 DIAGNOSIS — M24662 Ankylosis, left knee: Secondary | ICD-10-CM | POA: Insufficient documentation

## 2020-10-14 DIAGNOSIS — Z833 Family history of diabetes mellitus: Secondary | ICD-10-CM | POA: Insufficient documentation

## 2020-10-14 DIAGNOSIS — E7800 Pure hypercholesterolemia, unspecified: Secondary | ICD-10-CM | POA: Insufficient documentation

## 2020-10-14 HISTORY — PX: KNEE CLOSED REDUCTION: SHX995

## 2020-10-14 LAB — GLUCOSE, CAPILLARY
Glucose-Capillary: 114 mg/dL — ABNORMAL HIGH (ref 70–99)
Glucose-Capillary: 131 mg/dL — ABNORMAL HIGH (ref 70–99)

## 2020-10-14 SURGERY — MANIPULATION, KNEE, CLOSED
Anesthesia: General | Site: Knee | Laterality: Left

## 2020-10-14 MED ORDER — CHLORHEXIDINE GLUCONATE 0.12 % MT SOLN
15.0000 mL | Freq: Once | OROMUCOSAL | Status: AC
  Administered 2020-10-14: 15 mL via OROMUCOSAL

## 2020-10-14 MED ORDER — SODIUM CHLORIDE 0.9 % IV SOLN: INTRAVENOUS | Status: DC

## 2020-10-14 MED ORDER — LABETALOL HCL 5 MG/ML IV SOLN
INTRAVENOUS | Status: DC | PRN
  Administered 2020-10-14: 7.5 mg via INTRAVENOUS

## 2020-10-14 MED ORDER — MIDAZOLAM HCL 2 MG/2ML IJ SOLN
INTRAMUSCULAR | Status: DC | PRN
  Administered 2020-10-14: 1 mg via INTRAVENOUS

## 2020-10-14 MED ORDER — PROPOFOL 10 MG/ML IV BOLUS
INTRAVENOUS | Status: AC
  Filled 2020-10-14: qty 40

## 2020-10-14 MED ORDER — LACTATED RINGERS IV SOLN: INTRAVENOUS | Status: DC

## 2020-10-14 MED ORDER — OXYCODONE HCL 5 MG PO TABS
ORAL_TABLET | ORAL | Status: AC
  Administered 2020-10-14: 5 mg via ORAL
  Filled 2020-10-14: qty 1

## 2020-10-14 MED ORDER — ORAL CARE MOUTH RINSE: 15.0000 mL | Freq: Once | OROMUCOSAL | Status: AC

## 2020-10-14 MED ORDER — PROPOFOL 10 MG/ML IV BOLUS
INTRAVENOUS | Status: DC | PRN
  Administered 2020-10-14: 100 mg via INTRAVENOUS

## 2020-10-14 MED ORDER — OXYCODONE HCL 5 MG PO TABS: 5.0000 mg | ORAL_TABLET | Freq: Four times a day (QID) | ORAL | 0 refills | Status: DC | PRN

## 2020-10-14 MED ORDER — OXYCODONE HCL 5 MG PO TABS: 5.0000 mg | ORAL_TABLET | Freq: Once | ORAL | Status: AC

## 2020-10-14 MED ORDER — SUCCINYLCHOLINE CHLORIDE 20 MG/ML IJ SOLN
INTRAMUSCULAR | Status: DC | PRN
  Administered 2020-10-14: 80 mg via INTRAVENOUS

## 2020-10-14 MED ORDER — MIDAZOLAM HCL 2 MG/2ML IJ SOLN
INTRAMUSCULAR | Status: AC
  Filled 2020-10-14: qty 2

## 2020-10-14 SURGICAL SUPPLY — 4 items
KIT TURNOVER KIT A (KITS) ×2 IMPLANT
MANIFOLD NEPTUNE II (INSTRUMENTS) IMPLANT
PAD WRAPON POLAR KNEE (MISCELLANEOUS) IMPLANT
WRAPON POLAR PAD KNEE (MISCELLANEOUS) ×2

## 2020-10-14 NOTE — Transfer of Care (Signed)
Immediate Anesthesia Transfer of Care Note  Patient: Viann P Gose  Procedure(s) Performed: CLOSED MANIPULATION KNEE UNDER ANESTHESIA (Left: Knee)  Patient Location: PACU  Anesthesia Type:General  Level of Consciousness: awake, drowsy and patient cooperative  Airway & Oxygen Therapy: Patient Spontanous Breathing  Post-op Assessment: Report given to RN and Post -op Vital signs reviewed and stable  Post vital signs: Reviewed and stable  Last Vitals:  Vitals Value Taken Time  BP 107/90 10/14/20 1703  Temp    Pulse 64 10/14/20 1707  Resp 20 10/14/20 1707  SpO2 94 % 10/14/20 1707  Vitals shown include unvalidated device data.  Last Pain:  Vitals:   10/14/20 1551  TempSrc: Oral  PainSc: 2          Complications: No notable events documented.

## 2020-10-14 NOTE — H&P (Signed)
The patient has been re-examined, and the chart reviewed, and there have been no interval changes to the documented history and physical.  Plan a left knee manipulation under anesthesia today.  Anesthesia is consulted regarding a peripheral nerve block for post-operative pain.  The risks, benefits, and alternatives have been discussed at length, and the patient is willing to proceed.    

## 2020-10-14 NOTE — Anesthesia Preprocedure Evaluation (Signed)
Anesthesia Evaluation  Patient identified by MRN, date of birth, ID band Patient awake    Reviewed: Allergy & Precautions, H&P , NPO status , Patient's Chart, lab work & pertinent test results, reviewed documented beta blocker date and time   Airway Mallampati: II   Neck ROM: full    Dental  (+) Poor Dentition, Missing   Pulmonary shortness of breath, COPD, former smoker,    Pulmonary exam normal        Cardiovascular Exercise Tolerance: Poor hypertension, On Medications negative cardio ROS Normal cardiovascular exam Rhythm:regular Rate:Normal     Neuro/Psych negative neurological ROS  negative psych ROS   GI/Hepatic Neg liver ROS, GERD  Medicated,  Endo/Other  negative endocrine ROSdiabetes, Well Controlled, Type 2, Oral Hypoglycemic Agents  Renal/GU Renal disease  negative genitourinary   Musculoskeletal   Abdominal   Peds  Hematology negative hematology ROS (+)   Anesthesia Other Findings Past Medical History: No date: Allergic rhinitis No date: Diabetes (HCC) No date: Dyspnea No date: Emphysema lung (HCC) 10/10/2015: GERD (gastroesophageal reflux disease) 04/08/2016: Hx of tobacco use, presenting hazards to health No date: Hypercholesteremia No date: Hypertension 10/10/2015: Psoriasis Past Surgical History: No date: ABDOMINAL HYSTERECTOMY No date: BACK SURGERY No date: CERVICAL SPINE SURGERY No date: CHOLECYSTECTOMY No date: MASTECTOMY; Bilateral No date: SPINAL CORD STIMULATOR BATTERY EXCHANGE     Comment:  Osteostimulator in back 06/17/2020: TOTAL KNEE ARTHROPLASTY; Left     Comment:  Procedure: TOTAL KNEE ARTHROPLASTY;  Surgeon: Lyndle Herrlich, MD;  Location: [facility] ORS;  Service: Orthopedics;               Laterality: Left; BMI    Body Mass Index: 34.11 kg/m     Reproductive/Obstetrics negative OB ROS                             Anesthesia  Physical Anesthesia Plan  ASA: 3  Anesthesia Plan: General   Post-op Pain Management:    Induction:   PONV Risk Score and Plan: 4 or greater  Airway Management Planned:   Additional Equipment:   Intra-op Plan:   Post-operative Plan:   Informed Consent: I have reviewed the patients History and Physical, chart, labs and discussed the procedure including the risks, benefits and alternatives for the proposed anesthesia with the patient or authorized representative who has indicated his/her understanding and acceptance.     Dental Advisory Given  Plan Discussed with: CRNA  Anesthesia Plan Comments:         Anesthesia Quick Evaluation

## 2020-10-14 NOTE — Op Note (Signed)
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    The risks benefits and alternatives were discussed with the patient preoperatively including but not limited to the risks of infection, bleeding, nerve injury, cardiopulmonary complications, the need for revision surgery, among others, and the patient was willing to proceed.  EBL: none  TOURNIQUET TIME: none used  OPERATIVE IMPLANTS: none  OPERATIVE FINDINGS: Well healed scar, stable to varus and valgus at 0, 30, 60 and 90 degrees of flexion. Normal patellar tracking. ROM from 5 to 70 degrees  OPERATIVE PROCEDURE: After informed consent and the appropriate extremity marked, she was taken to the operating room and general  anesthesia was induced on the gurney. The left knee was gently manipulated to full extension and to 110 degrees of flexion. There was crepitus through the initial range of motion. An ACE wrap and polar care ice wrap were placed and she was taken to the recovery in good condition. She tolerated the procedure well.  Dola Argyle. Odis Luster, MD

## 2020-10-14 NOTE — Discharge Instructions (Addendum)
Post Op Home Instructions   PATIENT ALREADY HAS FOLLOW-UP AND PHYSICAL THERAPY SCHEDULED  1) Do not sit for longer than 1 hour at a time with your leg dangling down.  You should have your legs elevated (higher than your heart) in a recliner chair or couch.  2) You may be up walking around as tolerated but should take periodic breaks to elevate your legs.  Discontinue use of crutches when you feel you are able to walk without pain or a limp.  3) Pain medication can cause constipation.  You should increase your fluid intake, increase your intake of high fiber foods and/or take Metamucil as needed for constipation.  4) Continue your physical therapy exercises, as shown at the office, at least twice daily.  You should set up outpatient physical therapy and start within the first week after surgery.  5) Continue to use your Polar Pack continuously for 2-3 days after surgery.  It is a good idea to use your Polar Pack or ice pack for 30 minutes after doing your exercises to reduce swelling.  6) Do not be surprised if you have increased pain at night.  This usually means you have been a little too active during the day and need to reduce your activities.  7) If you develop lower extremity swelling that does not improve after a night of elevation, please call the office.  This could be an early sign of a blood clot.  Please call with any questions at [phone]   AMBULATORY SURGERY  DISCHARGE INSTRUCTIONS   The drugs that you were given will stay in your system until tomorrow so for the next 24 hours you should not:  Drive an automobile Make any legal decisions Drink any alcoholic beverage   You may resume regular meals tomorrow.  Today it is better to start with liquids and gradually work up to solid foods.  You may eat anything you prefer, but it is better to start with liquids, then soup and crackers, and gradually work up to solid foods.   Please notify your doctor immediately if  you have any unusual bleeding, trouble breathing, redness and pain at the surgery site, drainage, fever, or pain not relieved by medication.    Additional Instructions:        Please contact your physician with any problems or Same Day Surgery at [phone], Monday through Friday 6 am to 4 pm, or [place] [facility] number at [phone].

## 2020-10-14 NOTE — Anesthesia Postprocedure Evaluation (Signed)
Anesthesia Post Note  Patient: Julie Keith  Procedure(s) Performed: CLOSED MANIPULATION KNEE UNDER ANESTHESIA (Left: Knee)  Patient location during evaluation: PACU Anesthesia Type: General Level of consciousness: awake and alert Pain management: pain level controlled Vital Signs Assessment: post-procedure vital signs reviewed and stable Respiratory status: spontaneous breathing, nonlabored ventilation, respiratory function stable and patient connected to nasal cannula oxygen Cardiovascular status: blood pressure returned to baseline and stable Postop Assessment: no apparent nausea or vomiting Anesthetic complications: no   No notable events documented.   Last Vitals:  Vitals:   10/14/20 1715 10/14/20 1726  BP: (!) 145/69 (!) 157/66  Pulse: 64 65  Resp: 12 20  Temp:  36.4 C  SpO2: 96% 96%    Last Pain:  Vitals:   10/14/20 1726  TempSrc:   PainSc: 0-No pain                 Yevette Edwards

## 2020-10-15 ENCOUNTER — Encounter: Payer: Self-pay | Admitting: Orthopedic Surgery

## 2020-10-15 DIAGNOSIS — J449 Chronic obstructive pulmonary disease, unspecified: Secondary | ICD-10-CM | POA: Diagnosis not present

## 2020-10-18 DIAGNOSIS — E119 Type 2 diabetes mellitus without complications: Secondary | ICD-10-CM | POA: Diagnosis not present

## 2020-10-18 DIAGNOSIS — L409 Psoriasis, unspecified: Secondary | ICD-10-CM | POA: Diagnosis not present

## 2020-10-18 DIAGNOSIS — E7800 Pure hypercholesterolemia, unspecified: Secondary | ICD-10-CM | POA: Diagnosis not present

## 2020-10-18 DIAGNOSIS — T8482XD Fibrosis due to internal orthopedic prosthetic devices, implants and grafts, subsequent encounter: Secondary | ICD-10-CM | POA: Diagnosis not present

## 2020-10-18 DIAGNOSIS — Z87891 Personal history of nicotine dependence: Secondary | ICD-10-CM | POA: Diagnosis not present

## 2020-10-18 DIAGNOSIS — K219 Gastro-esophageal reflux disease without esophagitis: Secondary | ICD-10-CM | POA: Diagnosis not present

## 2020-10-18 DIAGNOSIS — I10 Essential (primary) hypertension: Secondary | ICD-10-CM | POA: Diagnosis not present

## 2020-10-18 DIAGNOSIS — Z96652 Presence of left artificial knee joint: Secondary | ICD-10-CM | POA: Diagnosis not present

## 2020-10-18 DIAGNOSIS — Z9181 History of falling: Secondary | ICD-10-CM | POA: Diagnosis not present

## 2020-10-18 DIAGNOSIS — Z7982 Long term (current) use of aspirin: Secondary | ICD-10-CM | POA: Diagnosis not present

## 2020-10-29 ENCOUNTER — Telehealth: Payer: Self-pay

## 2020-10-29 NOTE — Telephone Encounter (Signed)
Copied from CRM [id number]. Topic: General - Other >> Oct 29, 2020  1:02 PM Jaquita Rector A wrote: Reason for CRM: Vance Belcourt with [facility] called in to inform Danelle Berry that PT reported that the last 2 visits to see patient he was not able to do exercise due to her BP being too high. Please call Tommy from [facility] Ph# [phone]

## 2020-10-29 NOTE — Telephone Encounter (Signed)
Called PT readings were:   This am 202/82 waited later 194/92 while sitting

## 2020-10-30 ENCOUNTER — Ambulatory Visit (INDEPENDENT_AMBULATORY_CARE_PROVIDER_SITE_OTHER): Payer: Medicare Other | Admitting: Family Medicine

## 2020-10-30 ENCOUNTER — Encounter: Payer: Self-pay | Admitting: Family Medicine

## 2020-10-30 ENCOUNTER — Other Ambulatory Visit: Payer: Self-pay

## 2020-10-30 VITALS — BP 162/70 | HR 68 | Temp 98.2°F | Resp 16 | Ht 65.0 in | Wt 204.5 lb

## 2020-10-30 DIAGNOSIS — I10 Essential (primary) hypertension: Secondary | ICD-10-CM | POA: Diagnosis not present

## 2020-10-30 MED ORDER — AMLODIPINE BESYLATE 5 MG PO TABS: 5.0000 mg | ORAL_TABLET | Freq: Every day | ORAL | 0 refills | Status: DC

## 2020-10-30 NOTE — Progress Notes (Signed)
   SUBJECTIVE:   CHIEF COMPLAINT / HPI:   Hypertension: - Medications: enalapril, metoprolol  - Compliance: good - Checking BP at home: yes, unsure of measurements but knows it has been high - PT noticed elevated BP at therapy session, SBP 200s. - recent manipulation of knee 6/27 after L total knee replacement 05/2020. Has been having ongoing significant pain since manipulation. Has upcoming ortho appt 7/19. Taking tylenol. Doesn't want to take oxy b/c she can't drive after.    OBJECTIVE:   BP (!) 162/70   Pulse 68   Temp 98.2 F (36.8 C)   Resp 16   Ht 5\' 5"  (1.651 m)   Wt 204 lb 8 oz (92.8 kg)   SpO2 94%   BMI 34.03 kg/m   Gen: elderly, in NAD Card: RRR Lungs: CTAB Ext: WWP, no edema. Surgical scar on L knee CDI.  ASSESSMENT/PLAN:   Essential hypertension Uncontrolled, ongoing pain likely contributing. Will start amlodipine for better control. Encouraged continued efforts with PT and discussion with ortho for better pain control. Continue tylenol and prn oxy. F/u at previously scheduled appt next week.    , DO

## 2020-10-30 NOTE — Telephone Encounter (Signed)
Pt is scheduled with Dr Linwood Dibbles for this morning

## 2020-10-30 NOTE — Patient Instructions (Signed)
It was great to see you!  Our plans for today:  - We are starting a new medication for your blood pressure. - We will see you next week.  Take care and seek immediate care sooner if you develop any concerns.   Dr. Linwood Dibbles

## 2020-10-30 NOTE — Assessment & Plan Note (Addendum)
Uncontrolled, ongoing pain likely contributing. Will start amlodipine for better control. Encouraged continued efforts with PT and discussion with ortho for better pain control. Continue tylenol and prn oxy. F/u at previously scheduled appt next week.

## 2020-10-31 ENCOUNTER — Telehealth: Payer: Self-pay

## 2020-10-31 NOTE — Telephone Encounter (Signed)
Copied from CRM [id number]. Topic: General - Other >> Oct 31, 2020  9:55 AM Marylen Ponto wrote: Reason for CRM: Imelda Pillow PTA with [facility] called to report patient blood pressure reading today 196/92. Cb# [phone]

## 2020-11-04 ENCOUNTER — Telehealth: Payer: Self-pay | Admitting: Family Medicine

## 2020-11-04 NOTE — Telephone Encounter (Signed)
Thelma Comp calling from [facility]

## 2020-11-04 NOTE — Telephone Encounter (Signed)
Thelma Comp calling from [facility] is calling to report a BP reading of 192/76 8:45a 11/04/20,  202/72 11/04/20 9:00a, (prior to PT with Knee replacement) 164/63 10:30a 11/04/20 Pt is coming to appt on Friday. CB- [phone]

## 2020-11-08 ENCOUNTER — Encounter: Payer: Self-pay | Admitting: Family Medicine

## 2020-11-08 ENCOUNTER — Other Ambulatory Visit: Payer: Self-pay

## 2020-11-08 ENCOUNTER — Ambulatory Visit (INDEPENDENT_AMBULATORY_CARE_PROVIDER_SITE_OTHER): Payer: Medicare Other | Admitting: Family Medicine

## 2020-11-08 VITALS — BP 148/62 | HR 52 | Temp 98.3°F | Resp 16 | Ht 65.0 in | Wt 208.2 lb

## 2020-11-08 DIAGNOSIS — E782 Mixed hyperlipidemia: Secondary | ICD-10-CM

## 2020-11-08 DIAGNOSIS — E119 Type 2 diabetes mellitus without complications: Secondary | ICD-10-CM

## 2020-11-08 DIAGNOSIS — Z5181 Encounter for therapeutic drug level monitoring: Secondary | ICD-10-CM

## 2020-11-08 DIAGNOSIS — I10 Essential (primary) hypertension: Secondary | ICD-10-CM

## 2020-11-08 DIAGNOSIS — I499 Cardiac arrhythmia, unspecified: Secondary | ICD-10-CM

## 2020-11-08 DIAGNOSIS — K219 Gastro-esophageal reflux disease without esophagitis: Secondary | ICD-10-CM

## 2020-11-08 DIAGNOSIS — Z1159 Encounter for screening for other viral diseases: Secondary | ICD-10-CM

## 2020-11-08 DIAGNOSIS — J441 Chronic obstructive pulmonary disease with (acute) exacerbation: Secondary | ICD-10-CM

## 2020-11-08 DIAGNOSIS — D72829 Elevated white blood cell count, unspecified: Secondary | ICD-10-CM

## 2020-11-08 MED ORDER — PANTOPRAZOLE SODIUM 20 MG PO TBEC: 20.0000 mg | DELAYED_RELEASE_TABLET | Freq: Every day | ORAL | 3 refills | Status: DC

## 2020-11-08 MED ORDER — PRAVASTATIN SODIUM 80 MG PO TABS
80.0000 mg | ORAL_TABLET | Freq: Every day | ORAL | 3 refills | Status: DC
Start: 2020-11-08 — End: 2022-02-19

## 2020-11-08 NOTE — H&P (Signed)
COLLIN HENDLEY MRN:  [id number] DOB/SEX:  1945-10-05/female  CHIEF COMPLAINT:  Painful left Knee  HISTORY: Patient is a 75 y.o. female presented with a history of pain in the left knee. Onset of symptoms was gradual starting several years ago with gradually worsening course since that time. Prior procedures on the knee include none. Patient has been treated conservatively with over-the-counter NSAIDs and activity modification. Patient currently rates pain in the knee at 10 out of 10 with activity. There is pain at night.  PAST MEDICAL HISTORY: Patient Active Problem List   Diagnosis Date Noted   S/P TKR (total knee replacement) using cement, left 06/17/2020   Osteoarthritis of left knee 02/28/2020   Stage 3 chronic kidney disease (HCC) 09/21/2019   Psoriasis 10/10/2015   GERD (gastroesophageal reflux disease) 10/10/2015   Type 2 diabetes mellitus with diabetic nephropathy, without long-term current use of insulin (HCC) 10/31/2014   Essential hypertension 10/31/2014   Hyperlipidemia 10/31/2014   Morbid obesity (HCC) 10/31/2014   COPD (chronic obstructive pulmonary disease) (HCC) 06/28/2014   Past Medical History:  Diagnosis Date   Allergic rhinitis    Diabetes (HCC)    Dyspnea    Emphysema lung (HCC)    GERD (gastroesophageal reflux disease) 10/10/2015   Hx of tobacco use, presenting hazards to health 04/08/2016   Hypercholesteremia    Hypertension    Psoriasis 10/10/2015   Past Surgical History:  Procedure Laterality Date   ABDOMINAL HYSTERECTOMY     BACK SURGERY     CERVICAL SPINE SURGERY     CHOLECYSTECTOMY     KNEE CLOSED REDUCTION Left 10/14/2020   Procedure: CLOSED MANIPULATION KNEE UNDER ANESTHESIA;  Surgeon: Lyndle Herrlich, MD;  Location: [facility] ORS;  Service: Orthopedics;  Laterality: Left;   MASTECTOMY Bilateral    SPINAL CORD STIMULATOR BATTERY EXCHANGE     Osteostimulator in back   TOTAL KNEE ARTHROPLASTY Left 06/17/2020   Procedure: TOTAL KNEE ARTHROPLASTY;   Surgeon: Lyndle Herrlich, MD;  Location: [facility] OR[facility];  Service: Orthopedics;  Laterality: Left;     MEDICATIONS:   No medications prior to admission.    ALLERGIES:  No Known Allergies  REVIEW OF SYSTEMS:  Pertinent items are noted in HPI.   FAMILY HISTORY:   Family History  Problem Relation Age of Onset   Leukemia Mother    Pneumonia Father    COPD Father    Diabetes Brother    Cancer Brother        lung cancer   Diabetes Sister     SOCIAL HISTORY:   Social History   Tobacco Use   Smoking status: Former    Packs/day: 2.00    Years: 30.00    Pack years: 60.00    Types: Cigarettes   Smokeless tobacco: Never   Tobacco comments:    quit 2006  Substance Use Topics   Alcohol use: No    Alcohol/week: 0.0 standard drinks     EXAMINATION:  Vital signs in last 24 hours:    General appearance: alert, cooperative, and no distress Neck: no JVD and supple, symmetrical, trachea midline Lungs: clear to auscultation bilaterally Heart: regular rate and rhythm, S1, S2 normal, no murmur, click, rub or gallop Abdomen: soft, non-tender; bowel sounds normal; no masses,  no organomegaly Extremities: extremities normal, atraumatic, no cyanosis or edema and Homans sign is negative, no sign of DVT Pulses: 2+ and symmetric Skin: Skin color, texture, turgor normal. No rashes or lesions Neurologic: Alert and oriented X 3, normal  strength and tone. Normal symmetric reflexes. Normal coordination and gait  Musculoskeletal:  ROM 0-90, Ligaments intact,  Imaging Review Plain radiographs demonstrate severe degenerative joint disease of the left knee. The overall alignment is significant varus. The bone quality appears to be good for age and reported activity level.  Assessment/Plan: Primary osteoarthritis, left knee   The patient history, physical examination and imaging studies are consistent with advanced degenerative joint disease of the left knee. The patient has failed conservative  treatment.  The clearance notes were reviewed.  After discussion with the patient it was felt that Total Knee Replacement was indicated. The procedure,  risks, and benefits of total knee arthroplasty were presented and reviewed. The risks including but not limited to aseptic loosening, infection, blood clots, vascular injury, stiffness, patella tracking problems complications among others were discussed. The patient acknowledged the explanation, agreed to proceed with the plan.  [name] 11/08/2020, 1:38 PM

## 2020-11-08 NOTE — Progress Notes (Signed)
Name: Julie Keith   MRN: [id number]    DOB: [birth year]   Date:11/08/2020       Progress Note  Chief Complaint  Patient presents with   Hypertension   Diabetes   Hyperlipidemia     Subjective:   Julie Keith is a 75 y.o. female, presents to clinic for routine f/up  BP has been uncontrolled since knee replacement  Hypertension:  Currently managed on metoprolol, amlodipine (new added last week) and enalapril 20 BID Pt reports good med compliance and denies any SE.   Blood pressure today is elevated - repeated several times - slightly better prior to leaving office today 148/62 Feeling some palpitations  BP Readings from Last 3 Encounters:  11/08/20 (!) 162/72  10/30/20 (!) 162/70  10/14/20 (!) 176/73   Pt denies CP, SOB, exertional sx, LE edema, , Ha's, visual disturbances, lightheadedness, hypotension, syncope. Less walking due to surgery, gaining weight, pain due to surgery and PT with postopcomplications s/p left TKR  HR irregular and bradycardic today, wears oxygen at night, denies any exertional CP, near syncope, SOB  BP 148/62 at end of visit -   DM:   Pt managing DM with glimepiride Reports good med compliance Pt has no SE from meds. Blood sugars -not checking recently Denies: Polyuria, polydipsia, vision changes, neuropathy, hypoglycemia Recent pertinent labs: Lab Results  Component Value Date   HGBA1C 7.2 (H) 11/08/2020   HGBA1C 6.9 (H) 06/17/2020   HGBA1C 7.2 (H) 04/15/2020   Lab Results  Component Value Date   MICROALBUR 2.1 04/15/2020   LDLCALC 75 11/08/2020   CREATININE 1.09 (H) 11/08/2020   Standard of care and health maintenance: Foot exam:  done DM eye exam:  done ACEI/ARB:  yes Statin:  yes  Hyperlipidemia: Currently treated with pravastatin 80, pt reports good med compliance Last Lipids:  done today: last reviewed Lab Results  Component Value Date   CHOL 152 11/08/2020   HDL 58 11/08/2020   LDLCALC 75 11/08/2020   TRIG 107  11/08/2020   CHOLHDL 2.6 11/08/2020   - Denies: Chest pain, shortness of breath, myalgias, claudication  COPD - using albuterol inhalers and nebs prn - no significant changes from baseline, less active right now due to surgery/postop complications     Current Outpatient Medications:    acetaminophen (TYLENOL) 500 MG tablet, Take 1,000-2,500 mg by mouth 2 (two) times daily as needed for moderate pain., Disp: , Rfl:    albuterol (VENTOLIN HFA) 108 (90 Base) MCG/ACT inhaler, Inhale 2 puffs into the lungs every 6 (six) hours as needed for wheezing or shortness of breath., Disp: 1 each, Rfl: 2   amLODipine (NORVASC) 5 MG tablet, Take 1 tablet (5 mg total) by mouth daily., Disp: 90 tablet, Rfl: 0   aspirin 81 MG chewable tablet, Chew 1 tablet (81 mg total) by mouth 2 (two) times daily., Disp: 60 tablet, Rfl: 0   enalapril (VASOTEC) 20 MG tablet, Take 1 tablet (20 mg total) by mouth 2 (two) times daily., Disp: 180 tablet, Rfl: 1   glimepiride (AMARYL) 4 MG tablet, Take 1 tablet (4 mg total) by mouth daily with breakfast., Disp: 90 tablet, Rfl: 1   HYDROcodone-acetaminophen (NORCO/VICODIN) 5-325 MG tablet, Take 1 tablet by mouth every 6 (six) hours as needed., Disp: , Rfl:    ipratropium-albuterol (DUONEB) 0.5-2.5 (3) MG/3ML SOLN, Take 3 mLs by nebulization 3 (three) times daily as needed (for SOB/cough/wheeze COPD exacerbation)., Disp: 180 mL, Rfl: 0   loratadine (CLARITIN)  10 MG tablet, Take 1 tablet (10 mg total) by mouth daily. (Patient taking differently: Take 10 mg by mouth daily as needed for allergies.), Disp: 30 tablet, Rfl: 11   metoprolol succinate (TOPROL-XL) 25 MG 24 hr tablet, Take 1 tablet (25 mg total) by mouth daily., Disp: 90 tablet, Rfl: 3   OneTouch Delica Lancets 33G MISC, 1 Bottle by Does not apply route in the morning, at noon, and at bedtime., Disp: 100 each, Rfl: 11   oxyCODONE (ROXICODONE) 5 MG immediate release tablet, Take 1 tablet (5 mg total) by mouth every 6 (six) hours  as needed for severe pain., Disp: 20 tablet, Rfl: 0   pantoprazole (PROTONIX) 20 MG tablet, Take 1 tablet (20 mg total) by mouth daily., Disp: 90 tablet, Rfl: 1   pravastatin (PRAVACHOL) 80 MG tablet, Take 1 tablet (80 mg total) by mouth daily. (Patient taking differently: Take 80 mg by mouth every evening.), Disp: 90 tablet, Rfl: 1  Patient Active Problem List   Diagnosis Date Noted   S/P TKR (total knee replacement) using cement, left 06/17/2020   Osteoarthritis of left knee 02/28/2020   Stage 3 chronic kidney disease (HCC) 09/21/2019   Psoriasis 10/10/2015   GERD (gastroesophageal reflux disease) 10/10/2015   Type 2 diabetes mellitus with diabetic nephropathy, without long-term current use of insulin (HCC) 10/31/2014   Essential hypertension 10/31/2014   Hyperlipidemia 10/31/2014   Morbid obesity (HCC) 10/31/2014   COPD (chronic obstructive pulmonary disease) (HCC) 06/28/2014    Past Surgical History:  Procedure Laterality Date   ABDOMINAL HYSTERECTOMY     BACK SURGERY     CERVICAL SPINE SURGERY     CHOLECYSTECTOMY     KNEE CLOSED REDUCTION Left 10/14/2020   Procedure: CLOSED MANIPULATION KNEE UNDER ANESTHESIA;  Surgeon: Lyndle Herrlich, MD;  Location: [facility];  Service: Orthopedics;  Laterality: Left;   MASTECTOMY Bilateral    SPINAL CORD STIMULATOR BATTERY EXCHANGE     Osteostimulator in back   TOTAL KNEE ARTHROPLASTY Left 06/17/2020   Procedure: TOTAL KNEE ARTHROPLASTY;  Surgeon: Lyndle Herrlich, MD;  Location: [facility];  Service: Orthopedics;  Laterality: Left;    Family History  Problem Relation Age of Onset   Leukemia Mother    Pneumonia Father    COPD Father    Diabetes Brother    Cancer Brother        lung cancer   Diabetes Sister     Social History   Tobacco Use   Smoking status: Former    Packs/day: 2.00    Years: 30.00    Pack years: 60.00    Types: Cigarettes   Smokeless tobacco: Never   Tobacco comments:    quit 2006  Vaping Use   Vaping Use:  Never used  Substance Use Topics   Alcohol use: No    Alcohol/week: 0.0 standard drinks   Drug use: No     No Known Allergies  Health Maintenance  Topic Date Due   Zoster Vaccines- Shingrix (1 of 2) Never done   OPHTHALMOLOGY EXAM  05/28/2020   COVID-19 Vaccine (3 - Booster for Pfizer series) 09/08/2020   TETANUS/TDAP  04/15/2021 (Originally 03/31/1963)   Hepatitis C Screening  04/15/2021 (Originally 03/30/1962)   DEXA SCAN  07/17/2021 (Originally 03/30/2009)   INFLUENZA VACCINE  11/18/2020   HEMOGLOBIN A1C  12/15/2020   FOOT EXAM  04/15/2021   PNA vac Low Risk Adult  Completed   HPV VACCINES  Aged Out   MAMMOGRAM  Discontinued  Chart Review Today: I personally reviewed active problem list, medication list, allergies, family history, social history, health maintenance, notes from last encounter, lab results, imaging with the patient/caregiver today.   Review of Systems  Constitutional: Negative.   HENT: Negative.    Eyes: Negative.   Respiratory: Negative.    Cardiovascular: Negative.   Gastrointestinal: Negative.   Endocrine: Negative.   Genitourinary: Negative.   Musculoskeletal: Negative.   Skin: Negative.   Allergic/Immunologic: Negative.   Neurological: Negative.   Hematological: Negative.   Psychiatric/Behavioral: Negative.    All other systems reviewed and are negative.   Objective:   Vitals:   11/08/20 0905  BP: (!) 162/72  Pulse: (!) 52  Resp: 16  Temp: 98.3 F (36.8 C)  SpO2: 91%  Weight: 208 lb 3.2 oz (94.4 kg)  Height: 5\' 5"  (1.651 m)    Body mass index is 34.65 kg/m.  Physical Exam Vitals and nursing note reviewed.  Constitutional:      General: She is not in acute distress.    Appearance: Normal appearance. She is obese. She is not ill-appearing, toxic-appearing or diaphoretic.  HENT:     Head: Normocephalic and atraumatic.     Right Ear: External ear normal.     Left Ear: External ear normal.  Eyes:     General: No scleral  icterus.       Right eye: No discharge.        Left eye: No discharge.     Conjunctiva/sclera: Conjunctivae normal.  Cardiovascular:     Rate and Rhythm: Bradycardia present. Rhythm irregular.     Pulses: Normal pulses.     Heart sounds: Normal heart sounds. No murmur heard.   No friction rub. No gallop.  Pulmonary:     Effort: Pulmonary effort is normal.     Breath sounds: Normal breath sounds.  Abdominal:     General: Bowel sounds are normal. There is no distension.     Palpations: Abdomen is soft.  Skin:    General: Skin is warm and dry.     Coloration: Skin is not jaundiced.  Neurological:     Mental Status: She is alert. Mental status is at baseline.     Gait: Gait abnormal.  Psychiatric:        Mood and Affect: Mood normal.        Behavior: Behavior normal.     ECG interpretation   Date: 11/26/20  Rate: 63  Rhythm: sinus arhythmia with frequent pvc's  QRS Axis: normal  Intervals: normal  ST/T Wave abnormalities: normal  Conduction Disutrbances: none  Narrative Interpretation:   Old EKG Reviewed: compared to 06/19/20 ECG - same - No significant changes noted     Assessment & Plan:   1. Essential hypertension BP elevated - but did improve by the end of the visit - systolic still high and diastolic low normal - hesitate to add any more meds with diastolic reading and considering pt is in pain, ambulation is difficult - will monitor closely - expect it to improve back to baseline if she is able to get more mobile or loose a little weight  - COMPLETE METABOLIC PANEL WITH GFR  2. Gastroesophageal reflux disease, unspecified whether esophagitis present Well controlled - no concerns refills sent in - pantoprazole (PROTONIX) 20 MG tablet; Take 1 tablet (20 mg total) by mouth daily.  Dispense: 90 tablet; Refill: 3  3. Mixed hyperlipidemia Due for labs, good tolerance, no SE or concerns - pravastatin (PRAVACHOL)  80 MG tablet; Take 1 tablet (80 mg total) by mouth daily.   Dispense: 90 tablet; Refill: 3 - COMPLETE METABOLIC PANEL WITH GFR - Lipid panel  4. Type 2 diabetes mellitus without complication, without long-term current use of insulin (HCC) Has been well controlled on sulfonyrea - discussed risks, work on diet, monitor for hypotenion  - pravastatin (PRAVACHOL) 80 MG tablet; Take 1 tablet (80 mg total) by mouth daily.  Dispense: 90 tablet; Refill: 3 - COMPLETE METABOLIC PANEL WITH GFR - Hemoglobin A1C - Ambulatory referral to Ophthalmology  5. COPD  (HCC) Managed with prn meds, not on daily inhaler  6. Encounter for hepatitis C screening test for low risk patient  - Hepatitis C Antibody  7. Irregular heart beats ECG shows sinus arrhythmia and fairly frequent pvc's bradycardic with VS and HR 63 with ECG  - symptomatic with palpitations - HR would not likely allow for BB Checking labs to r/o electrolyte abnormality, thyroid dysfunction, anemia - COMPLETE METABOLIC PANEL WITH GFR - CBC with Differential/Platelet - Magnesium - TSH - EKG 12-Lead  8. Encounter for medication monitoring  - COMPLETE METABOLIC PANEL WITH GFR - Lipid panel - Hemoglobin A1C - CBC with Differential/Platelet  9. Leukocytosis, unspecified type  - CBC with Differential/Platelet    Return for 1 month BP f/up and do remaining routine OV f/up.   Danelle BerryLeisa Calea Hribar, PA-C 11/08/20 9:34 AM

## 2020-11-08 NOTE — Patient Instructions (Signed)
Only record BP readings from after having more than 10 minutes to sit and rest and not after exercise, severe pain, stress etc - see below  Call me if BP are over 150/90  Blood Pressure Record Sheet  Blood pressure log Date: _______________________ a.m. _____________________(1st reading) _____________________(2nd reading) p.m. _____________________(1st reading) _____________________(2nd reading) Date: _______________________ a.m. _____________________(1st reading) _____________________(2nd reading) p.m. _____________________(1st reading) _____________________(2nd reading) Date: _______________________ a.m. _____________________(1st reading) _____________________(2nd reading) p.m. _____________________(1st reading) _____________________(2nd reading) Date: _______________________ a.m. _____________________(1st reading) _____________________(2nd reading) p.m. _____________________(1st reading) _____________________(2nd reading) Date: _______________________ a.m. _____________________(1st reading) _____________________(2nd reading) p.m. _____________________(1st reading) _____________________(2nd reading) Date: _______________________ a.m. _____________________(1st reading) _____________________(2nd reading) p.m. _____________________(1st reading) _____________________(2nd reading) Date: _______________________ a.m. _____________________(1st reading) _____________________(2nd reading) p.m. _____________________(1st reading) _____________________(2nd reading) Date: _______________________ a.m. _____________________(1st reading) _____________________(2nd reading) p.m. _____________________(1st reading) _____________________(2nd reading) Date: _______________________ a.m. _____________________(1st reading) _____________________(2nd reading) p.m. _____________________(1st reading) _____________________(2nd reading) Date: _______________________ a.m. _____________________(1st reading)  _____________________(2nd reading) p.m. _____________________(1st reading) _____________________(2nd reading)  How to Take Your Blood Pressure Blood pressure measures how strongly your blood is pressing against the walls of your arteries. Arteries are blood vessels that carry blood from your heartthroughout your body. You can take your blood pressure at home with a machine. You may need to check your blood pressure at home: To check if you have high blood pressure (hypertension). To check your blood pressure over time. To make sure your blood pressure medicine is working. Supplies needed: Blood pressure machine, or monitor. Dining room chair to sit in. Table or desk. Small notebook. Pencil or pen. How to prepare Avoid these things for 30 minutes before checking your blood pressure: Having drinks with caffeine in them, such as coffee or tea. Drinking alcohol. Eating. Smoking. Exercising. Do these things five minutes before checking your blood pressure: Go to the bathroom and pee (urinate). Sit in a dining chair. Do not sit on a soft couch or an armchair. Be quiet. Do not talk. How to take your blood pressure Follow the instructions that came with your machine. If you have a digital blood pressure monitor, these may be the instructions: Sit up straight. Place your feet on the floor. Do not cross your ankles or legs. Rest your left arm at the level of your heart. You may rest it on a table, desk, or chair. Pull up your shirt sleeve. Wrap the blood pressure cuff around the upper part of your left arm. The cuff should be 1 inch (2.5 cm) above your elbow. It is best to wrap the cuff around bare skin. Fit the cuff snugly around your arm. You should be able to place only one finger between the cuff and your arm. Place the cord so that it rests in the bend of your elbow. Press the power button. Sit quietly while the cuff fills with air and loses air. Write down the numbers on the  screen. Wait 2-3 minutes and then repeat steps 1-10. What do the numbers mean? Two numbers make up your blood pressure. The first number is called systolic pressure. The second is called diastolic pressure. An example of a bloodpressure reading is "120 over 80" (or 120/80). If you are an adult and do not have a medical condition, use this guide to findout if your blood pressure is normal: Normal First number: below 120. Second number: below 80. Elevated First number: 120-129. Second number: below 80. Hypertension stage 1 First number: 130-139. Second number: 80-89. Hypertension stage 2 First number: 140 or above. Second number: 90 or above. Your  blood pressure is above normal even if only the first or only the secondnumber is above normal. Follow these instructions at home: Medicines Take over-the-counter and prescription medicines only as told by your doctor. Tell your doctor if your medicine is causing side effects. General instructions Check your blood pressure as often as your doctor tells you to. Check your blood pressure at the same time every day. Take your monitor to your next doctor's appointment. Your doctor will: Make sure you are using it correctly. Make sure it is working right. Understand what your blood pressure numbers should be. Keep all follow-up visits as told by your doctor. This is important. General tips You will need a blood pressure machine, or monitor. Your doctor can suggest a monitor. You can buy one at a drugstore or online. When choosing one: Choose one with an arm cuff. Choose one that wraps around your upper arm. Only one finger should fit between your arm and the cuff. Do not choose one that measures your blood pressure from your wrist or finger. Where to find more information American Heart Association: www.heart.org Contact a doctor if: Your blood pressure keeps being high. Your blood pressure is suddenly low. Get help right away if: Your  first blood pressure number is higher than 180. Your second blood pressure number is higher than 120. Summary Check your blood pressure at the same time every day. Avoid caffeine, alcohol, smoking, and exercise for 30 minutes before checking your blood pressure. Make sure you understand what your blood pressure numbers should be. This information is not intended to replace advice given to you by your health care provider. Make sure you discuss any questions you have with your healthcare provider. Document Revised: 02/14/2020 Document Reviewed: 03/31/2019 Elsevier Patient Education  2022 [employer].

## 2020-11-11 ENCOUNTER — Encounter: Payer: Self-pay | Admitting: Family Medicine

## 2020-11-11 ENCOUNTER — Ambulatory Visit: Payer: Self-pay | Admitting: *Deleted

## 2020-11-11 LAB — HEMOGLOBIN A1C
Hgb A1c MFr Bld: 7.2 % of total Hgb — ABNORMAL HIGH (ref <5.7)
Mean Plasma Glucose: 160 mg/dL
eAG (mmol/L): 8.9 mmol/L

## 2020-11-11 LAB — CBC WITH DIFFERENTIAL/PLATELET
Absolute Monocytes: 770 cells/uL (ref 200–950)
Basophils Absolute: 55 cells/uL (ref 0–200)
Basophils Relative: 0.5 %
Eosinophils Absolute: 99 cells/uL (ref 15–500)
Eosinophils Relative: 0.9 %
HCT: 40.8 % (ref 35.0–45.0)
Hemoglobin: 12.8 g/dL (ref 11.7–15.5)
Lymphs Abs: 2662 cells/uL (ref 850–3900)
MCH: 26.7 pg — ABNORMAL LOW (ref 27.0–33.0)
MCHC: 31.4 g/dL — ABNORMAL LOW (ref 32.0–36.0)
MCV: 85 fL (ref 80.0–100.0)
MPV: 10.4 fL (ref 7.5–12.5)
Monocytes Relative: 7 %
Neutro Abs: 7414 cells/uL (ref 1500–7800)
Neutrophils Relative %: 67.4 %
Platelets: 433 10*3/uL — ABNORMAL HIGH (ref 140–400)
RBC: 4.8 10*6/uL (ref 3.80–5.10)
RDW: 14.7 % (ref 11.0–15.0)
Total Lymphocyte: 24.2 %
WBC: 11 10*3/uL — ABNORMAL HIGH (ref 3.8–10.8)

## 2020-11-11 LAB — HEPATITIS C ANTIBODY
Hepatitis C Ab: NONREACTIVE
SIGNAL TO CUT-OFF: 0.1 (ref <1.00)

## 2020-11-11 LAB — COMPLETE METABOLIC PANEL WITH GFR
AG Ratio: 1.4 (calc) (ref 1.0–2.5)
ALT: 4 U/L — ABNORMAL LOW (ref 6–29)
AST: 11 U/L (ref 10–35)
Albumin: 4.3 g/dL (ref 3.6–5.1)
Alkaline phosphatase (APISO): 91 U/L (ref 37–153)
BUN/Creatinine Ratio: 19 (calc) (ref 6–22)
BUN: 21 mg/dL (ref 7–25)
CO2: 29 mmol/L (ref 20–32)
Calcium: 9.8 mg/dL (ref 8.6–10.4)
Chloride: 103 mmol/L (ref 98–110)
Creat: 1.09 mg/dL — ABNORMAL HIGH (ref 0.60–1.00)
Globulin: 3.1 g/dL (calc) (ref 1.9–3.7)
Glucose, Bld: 112 mg/dL — ABNORMAL HIGH (ref 65–99)
Potassium: 5 mmol/L (ref 3.5–5.3)
Sodium: 139 mmol/L (ref 135–146)
Total Bilirubin: 0.5 mg/dL (ref 0.2–1.2)
Total Protein: 7.4 g/dL (ref 6.1–8.1)
eGFR: 53 mL/min/{1.73_m2} — ABNORMAL LOW (ref >=60)

## 2020-11-11 LAB — LIPID PANEL
Cholesterol: 152 mg/dL (ref <200)
HDL: 58 mg/dL (ref >=50)
LDL Cholesterol (Calc): 75 mg/dL (calc)
Non-HDL Cholesterol (Calc): 94 mg/dL (calc) (ref <130)
Total CHOL/HDL Ratio: 2.6 (calc) (ref <5.0)
Triglycerides: 107 mg/dL (ref <150)

## 2020-11-11 LAB — TSH: TSH: 1.63 mIU/L (ref 0.40–4.50)

## 2020-11-11 LAB — MAGNESIUM: Magnesium: 1.9 mg/dL (ref 1.5–2.5)

## 2020-11-11 NOTE — Telephone Encounter (Signed)
Reason for Disposition  [1] New headache AND [2] age > 34  Answer Assessment - Initial Assessment Questions 1. LOCATION: "Where does it hurt?"      Front over eyes 2. ONSET: "When did the headache start?" (Minutes, hours or days)      Last night 3. PATTERN: "Does the pain come and go, or has it been constant since it started?"     Does not have presently, took tylenol and "Pain pill" 4. SEVERITY: "How bad is the pain?" and "What does it keep you from doing?"  (e.g., Scale 1-10; mild, moderate, or severe)   - MILD (1-3): doesn't interfere with normal activities    - MODERATE (4-7): interferes with normal activities or awakens from sleep    - SEVERE (8-10): excruciating pain, unable to do any normal activities        9/10 5. RECURRENT SYMPTOM: "Have you ever had headaches before?" If Yes, ask: "When was the last time?" and "What happened that time?"      no 6. CAUSE: "What do you think is causing the headache?"     unsure 7. MIGRAINE: "Have you been diagnosed with migraine headaches?" If Yes, ask: "Is this headache similar?"      no 8. HEAD INJURY: "Has there been any recent injury to the head?"      no 9. OTHER SYMPTOMS: "Do you have any other symptoms?" (fever, stiff neck, eye pain, sore throat, cold symptoms)     no  Protocols used: Headache-A-AH

## 2020-11-11 NOTE — Telephone Encounter (Signed)
Pt reports headache last night, 9/10, "Over my eyes."  Took tylenol then "Pain pill" before therapist came to home, effective. Does not have headache presently.Denies dizziness, denies congestion.   Please see previous encounter regarding BP, HR.  Asked pt to check during call: BP 169/64  HR 64. Agent had scheduled appt for tomorrow with Dr. Linwood Dibbles at 1100 prior to triage. Advised pt be sure to keep appt. Advised ED if symptoms worsen. Pt verbalizes understanding.

## 2020-11-12 ENCOUNTER — Encounter: Payer: Self-pay | Admitting: Family Medicine

## 2020-11-12 ENCOUNTER — Ambulatory Visit (INDEPENDENT_AMBULATORY_CARE_PROVIDER_SITE_OTHER): Payer: Medicare Other | Admitting: Family Medicine

## 2020-11-12 ENCOUNTER — Other Ambulatory Visit: Payer: Self-pay

## 2020-11-12 DIAGNOSIS — R002 Palpitations: Secondary | ICD-10-CM

## 2020-11-12 DIAGNOSIS — I10 Essential (primary) hypertension: Secondary | ICD-10-CM | POA: Diagnosis not present

## 2020-11-12 MED ORDER — AMLODIPINE BESYLATE 10 MG PO TABS: 10.0000 mg | ORAL_TABLET | Freq: Every day | ORAL | 0 refills | Status: DC

## 2020-11-12 MED ORDER — METOPROLOL SUCCINATE ER 25 MG PO TB24: 12.5000 mg | ORAL_TABLET | Freq: Every day | ORAL | 0 refills | Status: DC

## 2020-11-12 NOTE — Progress Notes (Signed)
   SUBJECTIVE:   CHIEF COMPLAINT / HPI:   Hypertension: - seen previously 7/13, 7/22 for same. Has had increased BP since knee surgery in June. Last visit also with slow heart rate. EKG 7/22 with sinus brady with frequent PVCs. - Medications: enalapril 20mg , metoprolol 25mg , amlodipine 5mg  (added 7/13) - Compliance: good - Checking BP at home: yes, SBP 150-160. HR 50s, one in 40s. - report of some headache, relieved with tylenol. Occasional palpitations, lasted few minutes, only happened once.  - Denies any SOB, CP, vision changes, LE edema, medication SEs, or   OBJECTIVE:   BP (!) 142/62   Pulse 68   Temp 98.4 F (36.9 C)   Resp 16   Ht 5\' 4"  (1.626 m)   Wt 206 lb 1.6 oz (93.5 kg)   SpO2 92%   BMI 35.38 kg/m   Gen: elderly, in NAD Card: RRR Lungs: CTAB Ext: WWP, no edema   ASSESSMENT/PLAN:   Essential hypertension With higher BP at home and symptomatic, will increase amlodipine. Reviewed labs previously obtained. EKG only notable for sinus bradycardia with PVCs. Will also decrease metoprolol given bradycardia. F/u next week.     , DO

## 2020-11-12 NOTE — Patient Instructions (Signed)
It was great to see you!  Our plans for today:  - Take 2 pills of the amlodipine to make 10mg .  - Take 1/2 tablet of the metoprolol to make 12.5mg .  - Come back next week for follow up.   Take care and seek immediate care sooner if you develop any concerns.   Dr. 

## 2020-11-12 NOTE — Assessment & Plan Note (Signed)
With higher BP at home and symptomatic, will increase amlodipine. Reviewed labs previously obtained. EKG only notable for sinus bradycardia with PVCs. Will also decrease metoprolol given bradycardia. F/u next week.

## 2020-11-13 ENCOUNTER — Ambulatory Visit: Payer: Self-pay | Admitting: *Deleted

## 2020-11-13 NOTE — Telephone Encounter (Signed)
Patient's grandson visited her yesterday and today he is Covid positive. He did not have any symptoms and had minimum contact. Patient does not have any symptoms at this time. Reviewed health information with the patient. Quarantine for the next 5 days and if no symptoms develop you most likely have not caught the virus. If you develop any symptoms discussed start your isolation period over from that day forward.Call with any questions or concerns. Patient does not have any home covid tests and no access to any testing.    Reason for Disposition  Health Information question, no triage required and triager able to answer question  Answer Assessment - Initial Assessment Questions 1. REASON FOR CALL or QUESTION: "What is your reason for calling today?" or "How can I best help you?" or "What question do you have that I can help answer?"     Grandson has covid and was near her yesterday.  Protocols used: Information Only Call - No Triage-A-AH

## 2020-11-14 ENCOUNTER — Ambulatory Visit: Payer: Self-pay | Admitting: *Deleted

## 2020-11-14 DIAGNOSIS — J449 Chronic obstructive pulmonary disease, unspecified: Secondary | ICD-10-CM | POA: Diagnosis not present

## 2020-11-14 NOTE — Telephone Encounter (Signed)
Called patient and she stated she was doing fine.  Per Corrie Dandy, did not go to ER this morning and do not feel she needs to go now.

## 2020-11-14 NOTE — Telephone Encounter (Signed)
Reason for Disposition  Heart beating very slowly (e.g., < 50 / minute)  (Exception: athlete and heart rate normal for caller)  Answer Assessment - Initial Assessment Questions 1. DESCRIPTION: "Please describe your heart rate or heartbeat that you are having" (e.g., fast/slow, regular/irregular, skipped or extra beats, "palpitations")   At rest 42-47, at rest.  60-70 with activity. 2. ONSET: "When did it start?" (Minutes, hours or days)      PT in home this AM. 3. DURATION: "How long does it last" (e.g., seconds, minutes, hours)     *No Answer* 4. PATTERN "Does it come and go, or has it been constant since it started?"  "Does it get worse with exertion?"   "Are you feeling it now?"     *No Answer* 5. TAP: "Using your hand, can you tap out what you are feeling on a chair or table in front of you, so that I can hear?" (Note: not all patients can do this)       *No Answer* 6. HEART RATE: "Can you tell me your heart rate?" "How many beats in 15 seconds?"  (Note: not all patients can do this)       *No Answer* 7. RECURRENT SYMPTOM: "Have you ever had this before?" If Yes, ask: "When was the last time?" and "What happened that time?"      *No Answer* 8. CAUSE: "What do you think is causing the palpitations?"     *No Answer* 9. CARDIAC HISTORY: "Do you have any history of heart disease?" (e.g., heart attack, angina, bypass surgery, angioplasty, arrhythmia)      *No Answer* 10. OTHER SYMPTOMS: "Do you have any other symptoms?" (e.g., dizziness, chest pain, sweating, difficulty breathing)      None  Protocols used: Heart Rate and Heartbeat Questions-A-AH

## 2020-11-14 NOTE — Telephone Encounter (Signed)
Julie Keith with [facility] calling to report pt's HR at rest 42-47, with activity 60-70. Pt is asymptomatic.  Saw Dr. Linwood Dibbles 11/12/20, decreased metoprolol. Assured pt NT would route to practice for PCPs review and final disposition. PT advised of symptoms that warrant an ED visit.  CB# [phone]

## 2020-11-17 DIAGNOSIS — Z96652 Presence of left artificial knee joint: Secondary | ICD-10-CM | POA: Diagnosis not present

## 2020-11-17 DIAGNOSIS — E7800 Pure hypercholesterolemia, unspecified: Secondary | ICD-10-CM | POA: Diagnosis not present

## 2020-11-17 DIAGNOSIS — Z87891 Personal history of nicotine dependence: Secondary | ICD-10-CM | POA: Diagnosis not present

## 2020-11-17 DIAGNOSIS — I10 Essential (primary) hypertension: Secondary | ICD-10-CM | POA: Diagnosis not present

## 2020-11-17 DIAGNOSIS — T8482XD Fibrosis due to internal orthopedic prosthetic devices, implants and grafts, subsequent encounter: Secondary | ICD-10-CM | POA: Diagnosis not present

## 2020-11-17 DIAGNOSIS — Z9181 History of falling: Secondary | ICD-10-CM | POA: Diagnosis not present

## 2020-11-17 DIAGNOSIS — K219 Gastro-esophageal reflux disease without esophagitis: Secondary | ICD-10-CM | POA: Diagnosis not present

## 2020-11-17 DIAGNOSIS — L409 Psoriasis, unspecified: Secondary | ICD-10-CM | POA: Diagnosis not present

## 2020-11-17 DIAGNOSIS — E119 Type 2 diabetes mellitus without complications: Secondary | ICD-10-CM | POA: Diagnosis not present

## 2020-11-17 DIAGNOSIS — Z7982 Long term (current) use of aspirin: Secondary | ICD-10-CM | POA: Diagnosis not present

## 2020-11-18 NOTE — Telephone Encounter (Signed)
Patient stated she is doing good today. Physical Therapy was there and heart rate and bp was normal. She is currently taking 1/2 Metoprolol a day. Appointment on Wednesday will bring a log at that time

## 2020-11-19 ENCOUNTER — Other Ambulatory Visit: Payer: Self-pay | Admitting: Family Medicine

## 2020-11-19 DIAGNOSIS — E119 Type 2 diabetes mellitus without complications: Secondary | ICD-10-CM

## 2020-11-19 DIAGNOSIS — I10 Essential (primary) hypertension: Secondary | ICD-10-CM

## 2020-11-19 MED ORDER — ENALAPRIL MALEATE 20 MG PO TABS: 20.0000 mg | ORAL_TABLET | Freq: Two times a day (BID) | ORAL | 1 refills | Status: DC

## 2020-11-19 MED ORDER — GLIMEPIRIDE 4 MG PO TABS: 4.0000 mg | ORAL_TABLET | Freq: Every day | ORAL | 1 refills | Status: DC

## 2020-11-19 MED ORDER — AMLODIPINE BESYLATE 10 MG PO TABS: 10.0000 mg | ORAL_TABLET | Freq: Every day | ORAL | 1 refills | Status: DC

## 2020-11-20 ENCOUNTER — Other Ambulatory Visit: Payer: Self-pay

## 2020-11-20 ENCOUNTER — Encounter: Payer: Self-pay | Admitting: Family Medicine

## 2020-11-20 ENCOUNTER — Ambulatory Visit (INDEPENDENT_AMBULATORY_CARE_PROVIDER_SITE_OTHER): Payer: Medicare Other | Admitting: Family Medicine

## 2020-11-20 VITALS — BP 120/62 | HR 55 | Temp 97.9°F | Resp 16 | Ht 64.0 in | Wt 207.9 lb

## 2020-11-20 DIAGNOSIS — I10 Essential (primary) hypertension: Secondary | ICD-10-CM

## 2020-11-20 NOTE — Patient Instructions (Signed)
It was great to see you!  Our plans for today:  - Stop taking the metoprolol.  - When you check your blood pressure, make sure you have been sitting for at least 5 minutes and are calm. - Come back in 2-4 weeks and bring your log and blood pressure cuff with you.   Take care and seek immediate care sooner if you develop any concerns.   Dr. Linwood Dibbles

## 2020-11-20 NOTE — Assessment & Plan Note (Signed)
Some higher BP noted at home though normal in office and with PT. Still with bradycardia. Will discontinue metoprolol. F/u in clinic in 2-4 weeks, instructed to bring home BP cuff and log at that time.

## 2020-11-20 NOTE — Progress Notes (Signed)
    SUBJECTIVE:   CHIEF COMPLAINT / HPI:   Hypertension: - seen previously 7/13, 7/22 for same. Has had increased BP since knee surgery in June.  EKG 7/22 with sinus brady with frequent PVCs. Decreased metoprolol at last visit and increased amlodipine.  - Medications: enalapril 20mg , metoprolol 12.5mg , amlodipine 10mg   - Compliance: good - Checking BP at home: yes, SBP 110-170, normally 130-150s. HR 50s. - no palpitations, headaches.   OBJECTIVE:   BP 120/62 (BP Location: Left Arm, Patient Position: Sitting)   Pulse (!) 55   Temp 97.9 F (36.6 C) (Oral)   Resp 16   Ht 5\' 4"  (1.626 m)   Wt 207 lb 14.4 oz (94.3 kg)   SpO2 91%   BMI 35.69 kg/m   Gen: elderly, well appearing, in NAD Ext: WWP  ASSESSMENT/PLAN:   Essential hypertension Some higher BP noted at home though normal in office and with PT. Still with bradycardia. Will discontinue metoprolol. F/u in clinic in 2-4 weeks, instructed to bring home BP cuff and log at that time.     , DO

## 2020-11-27 ENCOUNTER — Telehealth: Payer: Self-pay | Admitting: Family Medicine

## 2020-11-27 NOTE — Telephone Encounter (Signed)
Pt.notified

## 2020-11-27 NOTE — Telephone Encounter (Signed)
Originally med was working to lower BP / patients heart rate was in the low 50's and high 40's / During PT visit they walked and 3 minutes in patient complained about being swimmy headed / after they took BP again it was 210/62 and her heart rate was 79   Julie Keith wanted to alert of vitals / please advise

## 2020-11-28 ENCOUNTER — Ambulatory Visit: Payer: Self-pay | Admitting: *Deleted

## 2020-11-28 NOTE — Telephone Encounter (Signed)
Summary: Pt  concerned BP   Note from encounter yesterday: CMA Graves She should keep record of her BP as she has been doing. If her BP remains higher than 160 SBP or heart rate lower than 50s consistently for a few days, she should come back sooner than her scheduled appt on 8/23. Otherwise, ok to wait until visit then.  Update pt has called back in and I reread note to her her bp is 164/68/ last nite in the 170's and heart racing when woke up. Not racing now with prev. State BP. Very agitated. Pls reach outto pt [phone]      Pt reports last night BP 170's "And heart racing." This AM 164/68, 1 hr.after taking amlodipine at 0730. During call 170/84  HR 57. Reports headache 3-4/10 "Unusual for me." No other associated symptoms. Advised to call for BP out of given parameters. Assured pt NT would route to practice for PCPs review and final disposition.  Please advise: [phone] Reason for Disposition  Systolic BP  >= 160 OR Diastolic >= 100  Answer Assessment - Initial Assessment Questions 1. BLOOD PRESSURE: "What is the blood pressure?" "Did you take at least two measurements 5 minutes apart?"     0730 this AM 164/68  During call 170/84  HR 57 2. ONSET: "When did you take your blood pressure?"     This AM 3. HOW: "How did you obtain the blood pressure?" (e.g., visiting nurse, automatic home BP monitor)     Home 4. HISTORY: "Do you have a history of high blood pressure?"     Yes 5. MEDICATIONS: "Are you taking any medications for blood pressure?" "Have you missed any doses recently?"     no 6. OTHER SYMPTOMS: "Do you have any symptoms?" (e.g., headache, chest pain, blurred vision, difficulty breathing, weakness)     Headache 3-4/10  Protocols used: Blood Pressure - High-A-AH

## 2020-11-28 NOTE — Telephone Encounter (Signed)
Please call and get her a f/u this week or early next week.  If next week try for rumball.

## 2020-11-28 NOTE — Telephone Encounter (Signed)
Pt is scheduled for Wednesday 12/04/20 with Linwood Dibbles

## 2020-11-28 NOTE — Telephone Encounter (Signed)
Wanted to route to you as well, will call to get her in sooner.  I believe we are booked for the rest of week will try for next week!

## 2020-12-04 ENCOUNTER — Encounter: Payer: Self-pay | Admitting: Family Medicine

## 2020-12-04 ENCOUNTER — Other Ambulatory Visit: Payer: Self-pay

## 2020-12-04 ENCOUNTER — Ambulatory Visit (INDEPENDENT_AMBULATORY_CARE_PROVIDER_SITE_OTHER): Payer: Medicare Other | Admitting: Family Medicine

## 2020-12-04 VITALS — BP 134/78 | HR 72 | Temp 98.6°F | Resp 16 | Ht 64.0 in | Wt 207.8 lb

## 2020-12-04 DIAGNOSIS — R002 Palpitations: Secondary | ICD-10-CM

## 2020-12-04 DIAGNOSIS — I10 Essential (primary) hypertension: Secondary | ICD-10-CM

## 2020-12-04 MED ORDER — METOPROLOL SUCCINATE ER 25 MG PO TB24: 12.5000 mg | ORAL_TABLET | Freq: Every day | ORAL | 0 refills | Status: DC

## 2020-12-04 NOTE — Progress Notes (Signed)
    SUBJECTIVE:   CHIEF COMPLAINT / HPI:   Hypertension: - seen previously 7/13, 7/22, 8/3 for same. Has had increased BP since knee surgery in June. Also with bradycardia, EKG 7/22 with sinus brady and frequent PVCs. Discontinued metoprolol at last visit d/t bradycardia. - Medications: enalapril 20mg  BID, amlodipine 10mg  - Compliance: good - Checking BP at home: yes, SBP 140-150s, one reading 170 SBP. HR 50s - does have some heart racing, more at night. Will get a headache with higher BP.  - notices BP will come down when massages her knee - Denies any SOB, CP, vision changes, medication SEs.    OBJECTIVE:   BP 134/78   Pulse 72   Temp 98.6 F (37 C) (Oral)   Resp 16   Ht 5\' 4"  (1.626 m)   Wt 207 lb 12.8 oz (94.3 kg)   SpO2 94%   BMI 35.67 kg/m   Gen: well appearing, in NAD Card: RRR Lungs: CTAB Ext: WWP, 1+ pitting edema to ankles b/l.  ASSESSMENT/PLAN:   Essential hypertension At goal in office today but still with some higher BP readings at home. Also with resumption of palpitations since d/c metoprolol, will add back at 12.5mg  though HR still in 50s. Given inability to maintain BP at goal and resumption of palpitations, will refer to Cardiology.    , DO

## 2020-12-04 NOTE — Assessment & Plan Note (Signed)
At goal in office today but still with some higher BP readings at home. Also with resumption of palpitations since d/c metoprolol, will add back at 12.5mg  though HR still in 50s. Given inability to maintain BP at goal and resumption of palpitations, will refer to Cardiology.

## 2020-12-04 NOTE — Patient Instructions (Signed)
It was great to see you!  Our plans for today:  - Resume a half tablet (12.5mg ) of metoprolol.  - We referred you to cardiology. Let us know if you don't hear about an appointment in the next few weeks.   Take care and seek immediate care sooner if you develop any concerns.   Dr. Linwood Dibbles

## 2020-12-10 ENCOUNTER — Ambulatory Visit: Payer: Medicare Other | Admitting: Family Medicine

## 2020-12-15 DIAGNOSIS — J449 Chronic obstructive pulmonary disease, unspecified: Secondary | ICD-10-CM | POA: Diagnosis not present

## 2020-12-26 ENCOUNTER — Other Ambulatory Visit: Payer: Self-pay

## 2020-12-26 ENCOUNTER — Ambulatory Visit: Payer: Self-pay | Admitting: *Deleted

## 2020-12-26 ENCOUNTER — Ambulatory Visit (INDEPENDENT_AMBULATORY_CARE_PROVIDER_SITE_OTHER): Payer: Medicare Other | Admitting: Nurse Practitioner

## 2020-12-26 VITALS — BP 149/82 | HR 94 | Temp 98.0°F | Resp 18 | Ht 64.0 in | Wt 210.0 lb

## 2020-12-26 DIAGNOSIS — I10 Essential (primary) hypertension: Secondary | ICD-10-CM | POA: Diagnosis not present

## 2020-12-26 DIAGNOSIS — R600 Localized edema: Secondary | ICD-10-CM | POA: Diagnosis not present

## 2020-12-26 DIAGNOSIS — Z23 Encounter for immunization: Secondary | ICD-10-CM | POA: Diagnosis not present

## 2020-12-26 MED ORDER — HYDROCHLOROTHIAZIDE 12.5 MG PO TABS: 12.5000 mg | ORAL_TABLET | Freq: Every day | ORAL | 0 refills | Status: DC

## 2020-12-26 NOTE — Patient Instructions (Addendum)
Get and use compression socks Keep cardiology appointment Keep b/p journal for appointment

## 2020-12-26 NOTE — Telephone Encounter (Signed)
Reason for Disposition  Systolic BP  >= 160 OR Diastolic >= 100  Answer Assessment - Initial Assessment Questions 1. BLOOD PRESSURE: "What is the blood pressure?" "Did you take at least two measurements 5 minutes apart?"     192/77 and ankles swollen.   I saw the dr. And I'm supposed to see a cardiologist on 01/06/2021 for this.   It's been over 2 weeks ago. My ankles are swelling more.  The right one is more swollen.   Back in Feb. 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 2. ONSET: "When did you take your blood pressure?"     This morning 3. HOW: "How did you obtain the blood pressure?" (e.g., visiting nurse, automatic home BP monitor)     Just now 4. HISTORY: "Do you have a history of high blood pressure?"     Yes 5. MEDICATIONS: "Are you taking any medications for blood pressure?" "Have you missed any doses recently?"     Yes 2 medications 6. OTHER SYMPTOMS: "Do you have any symptoms?" (e.g., headache, chest pain, blurred vision, difficulty breathing, weakness)     No shortness of breath or chest pain.   I have a slight headache.   Sometimes I feel like my heart is racing intermittently.   I'm to see the cardiologist for the BP and swelling ankles. 7. PREGNANCY: "Is there any chance you are pregnant?" "When was your last menstrual period?"     N/A  Protocols used: Blood Pressure - High-A-AH

## 2020-12-26 NOTE — Telephone Encounter (Signed)
Pt calling in c/o elevated BP and swollen ankles that are getting worse since being seen recently by Dr. Linwood Dibbles.   She is scheduled to see a cardiologist on 01/06/2021 however she doesn't feel she can wait that long.     She is requesting to be seen sooner.  I have sent a note to [facility] to see if they can work her in sooner.   I've sent it high priority.   Pt was agreeable to this plan of someone calling her back.  I sent a high priority note to [facility].

## 2020-12-26 NOTE — Assessment & Plan Note (Addendum)
Blood pressure was 192/77 at home, sees cardiologist on 9/19.  Will start HCTZ 12.5 mg, she will continue to monitor blood pressure and keep cardiologist appointment. She will reach out if blood pressure decreases too much and we can reduce the amlodipine dose.

## 2020-12-26 NOTE — Telephone Encounter (Signed)
Appt made for today 12-26-2020 at 10:40

## 2020-12-26 NOTE — Progress Notes (Signed)
BP (!) 149/82   Pulse 94   Temp 98 F (36.7 C)   Resp 18   Ht 5' 4" (1.626 m)   Wt 210 lb (95.3 kg)   SpO2 96%   BMI 36.05 kg/m    Subjective:    Patient ID: Julie Keith, female    DOB: May 01, 1945, 75 y.o.   MRN: [id number]  HPI: Julie Keith is a 75 y.o. female  Chief Complaint  Patient presents with   Hypertension    Pt having some swelling on both of her feet, she states its getting worst and is highly concerned.   Hypertension: She has been seen multiple times for elevated blood pressure.  She is currently on amlodipine 10 mg daily, enalapril 20 mg BID, and metoprolol 12.5 mg daily.   She says that her blood pressure is still running high. This morning blood pressure was 192/77, most the time her blood pressure is running 150-160's at home. Her blood pressure is 149/82 here.  She denies any chest pain or shortness of breath.  She does say she has palpitations but is seeing Cardiologist on 9/19 regarding this. Discussed starting low dose HCTZ and seeing if that helps.    Bilateral lower extremity edema: She says the swelling in her ankles started about 3 weeks ago.  The swelling gets worse through out the day.  She says the swelling goes down after putting her feet up.  Discussed compression socks and elevating legs.  Discussed how HCTZ may help with the edema.    Relevant past medical, surgical, family and social history reviewed and updated as indicated. Interim medical history since our last visit reviewed. Allergies and medications reviewed and updated.  Review of Systems  Constitutional: Negative for fever, positive weight change.  Respiratory: Negative for cough and shortness of breath.   Cardiovascular: Negative for chest pain, positive for palpitations.  Gastrointestinal: Negative for abdominal pain, no bowel changes.  Musculoskeletal: Positive for gait problem (uses cane),  right knee replacement Skin: Negative for rash.  Lower extremities: positive for  swelling Neurological: Negative for dizziness or headache.  No other specific complaints in a complete review of systems (except as listed in HPI above).      Objective:    BP (!) 149/82   Pulse 94   Temp 98 F (36.7 C)   Resp 18   Ht 5' 4" (1.626 m)   Wt 210 lb (95.3 kg)   SpO2 96%   BMI 36.05 kg/m   Wt Readings from Last 3 Encounters:  12/26/20 210 lb (95.3 kg)  12/04/20 207 lb 12.8 oz (94.3 kg)  11/20/20 207 lb 14.4 oz (94.3 kg)    Physical Exam  Constitutional: Patient appears well-developed and well-nourished. No distress.  HEENT: head atraumatic, normocephalic, pupils equal and reactive to light, neck supple, throat within normal limits Cardiovascular: Normal rate, regular rhythm and normal heart sounds.  No murmur heard. Bilateral lower extremity mild non-pitting edema Pulmonary/Chest: Effort normal and breath sounds normal. No respiratory distress. Abdominal: Soft.  There is no tenderness. Psychiatric: Patient has a normal mood and affect. behavior is normal. Judgment and thought content normal.   Results for orders placed or performed in visit on 11/08/20  COMPLETE METABOLIC PANEL WITH GFR  Result Value Ref Range   Glucose, Bld 112 (H) 65 - 99 mg/dL   BUN 21 7 - 25 mg/dL   Creat 1.09 (H) 0.60 - 1.00 mg/dL   eGFR 53 (L) > OR =  60 mL/min/1.34m   BUN/Creatinine Ratio 19 6 - 22 (calc)   Sodium 139 135 - 146 mmol/L   Potassium 5.0 3.5 - 5.3 mmol/L   Chloride 103 98 - 110 mmol/L   CO2 29 20 - 32 mmol/L   Calcium 9.8 8.6 - 10.4 mg/dL   Total Protein 7.4 6.1 - 8.1 g/dL   Albumin 4.3 3.6 - 5.1 g/dL   Globulin 3.1 1.9 - 3.7 g/dL (calc)   AG Ratio 1.4 1.0 - 2.5 (calc)   Total Bilirubin 0.5 0.2 - 1.2 mg/dL   Alkaline phosphatase (APISO) 91 37 - 153 U/L   AST 11 10 - 35 U/L   ALT 4 (L) 6 - 29 U/L  Lipid panel  Result Value Ref Range   Cholesterol 152 <200 mg/dL   HDL 58 > OR = 50 mg/dL   Triglycerides 107 <150 mg/dL   LDL Cholesterol (Calc) 75 mg/dL (calc)    Total CHOL/HDL Ratio 2.6 <5.0 (calc)   Non-HDL Cholesterol (Calc) 94 <130 mg/dL (calc)  Hemoglobin A1C  Result Value Ref Range   Hgb A1c MFr Bld 7.2 (H) <5.7 % of total Hgb   Mean Plasma Glucose 160 mg/dL   eAG (mmol/L) 8.9 mmol/L  Hepatitis C Antibody  Result Value Ref Range   Hepatitis C Ab NON-REACTIVE NON-REACTIVE   SIGNAL TO CUT-OFF 0.10 <1.00  CBC with Differential/Platelet  Result Value Ref Range   WBC 11.0 (H) 3.8 - 10.8 Thousand/uL   RBC 4.80 3.80 - 5.10 Million/uL   Hemoglobin 12.8 11.7 - 15.5 g/dL   HCT 40.8 35.0 - 45.0 %   MCV 85.0 80.0 - 100.0 fL   MCH 26.7 (L) 27.0 - 33.0 pg   MCHC 31.4 (L) 32.0 - 36.0 g/dL   RDW 14.7 11.0 - 15.0 %   Platelets 433 (H) 140 - 400 Thousand/uL   MPV 10.4 7.5 - 12.5 fL   Neutro Abs 7,414 1,500 - 7,800 cells/uL   Lymphs Abs 2,662 850 - 3,900 cells/uL   Absolute Monocytes 770 200 - 950 cells/uL   Eosinophils Absolute 99 15 - 500 cells/uL   Basophils Absolute 55 0 - 200 cells/uL   Neutrophils Relative % 67.4 %   Total Lymphocyte 24.2 %   Monocytes Relative 7.0 %   Eosinophils Relative 0.9 %   Basophils Relative 0.5 %  Magnesium  Result Value Ref Range   Magnesium 1.9 1.5 - 2.5 mg/dL  TSH  Result Value Ref Range   TSH 1.63 0.40 - 4.50 mIU/L      Assessment & Plan:   Problem List Items Addressed This Visit       Cardiovascular and Mediastinum   Essential hypertension - Primary (Chronic)    Blood pressure was 192/77 at home, sees cardiologist on 9/19.  Will start HCTZ 12.5 mg, she will continue to monitor blood pressure and keep cardiologist appointment. She will reach out if blood pressure decreases too much and we can reduce the amlodipine dose.      Relevant Medications   hydrochlorothiazide (HYDRODIURIL) 12.5 MG tablet   Other Visit Diagnoses     Localized edema       ankle and feet edema, will start HCTZ to help, wear compression socks, elevate extremities and follow up with cardiology   Relevant Medications    hydrochlorothiazide (HYDRODIURIL) 12.5 MG tablet   Needs flu shot            Follow up plan: Return if symptoms worsen or fail to  improve, for keep cardiology appointment.

## 2021-01-06 ENCOUNTER — Other Ambulatory Visit: Payer: Self-pay

## 2021-01-06 ENCOUNTER — Ambulatory Visit: Payer: Medicare Other | Admitting: Nurse Practitioner

## 2021-01-06 ENCOUNTER — Encounter: Payer: Self-pay | Admitting: Nurse Practitioner

## 2021-01-06 VITALS — BP 162/74 | HR 82 | Ht 64.0 in | Wt 207.2 lb

## 2021-01-06 DIAGNOSIS — I10 Essential (primary) hypertension: Secondary | ICD-10-CM

## 2021-01-06 DIAGNOSIS — R011 Cardiac murmur, unspecified: Secondary | ICD-10-CM | POA: Diagnosis not present

## 2021-01-06 DIAGNOSIS — E782 Mixed hyperlipidemia: Secondary | ICD-10-CM

## 2021-01-06 DIAGNOSIS — R600 Localized edema: Secondary | ICD-10-CM | POA: Diagnosis not present

## 2021-01-06 MED ORDER — CARVEDILOL 6.25 MG PO TABS: 6.2500 mg | ORAL_TABLET | Freq: Two times a day (BID) | ORAL | 3 refills | Status: DC

## 2021-01-06 MED ORDER — AMLODIPINE BESYLATE 5 MG PO TABS: 5.0000 mg | ORAL_TABLET | Freq: Every day | ORAL | 3 refills | Status: DC

## 2021-01-06 MED ORDER — AMLODIPINE BESYLATE 10 MG PO TABS: 5.0000 mg | ORAL_TABLET | Freq: Every day | ORAL | 1 refills | Status: DC

## 2021-01-06 NOTE — Progress Notes (Signed)
Cardiology Clinic Note   Patient Name: Julie Keith Date of Encounter: 01/06/2021  Primary Care Provider:  Danelle Berry, PA-C Primary Cardiologist:  Lorine Bears, MD  Patient Profile    75 year old female with history of premature atrial contractions and atrial tachycardia, diabetes, hypertension, hyperlipidemia, obesity, prior tobacco abuse, and COPD, who presents for follow-up related to HTN and lower extremity edema.  Past Medical History    Past Medical History:  Diagnosis Date   Allergic rhinitis    Diabetes (HCC)    Dyspnea    Emphysema lung (HCC)    GERD (gastroesophageal reflux disease) 10/10/2015   Hx of tobacco use, presenting hazards to health 04/08/2016   Hypercholesteremia    Hypertension    Palpitations    a. 07/2016 Zio: RSR, avg HR 67 (50-126), 2 SVT episodes, likely A tach - fastest 12 beats @ 154. 40,027 isolated PACs (13.5% burden).   Premature atrial contractions    Psoriasis 10/10/2015   Past Surgical History:  Procedure Laterality Date   ABDOMINAL HYSTERECTOMY     BACK SURGERY     CERVICAL SPINE SURGERY     CHOLECYSTECTOMY     KNEE CLOSED REDUCTION Left 10/14/2020   Procedure: CLOSED MANIPULATION KNEE UNDER ANESTHESIA;  Surgeon: Lyndle Herrlich, MD;  Location: [facility];  Service: Orthopedics;  Laterality: Left;   MASTECTOMY Bilateral    SPINAL CORD STIMULATOR BATTERY EXCHANGE     Osteostimulator in back   TOTAL KNEE ARTHROPLASTY Left 06/17/2020   Procedure: TOTAL KNEE ARTHROPLASTY;  Surgeon: Lyndle Herrlich, MD;  Location: [facility];  Service: Orthopedics;  Laterality: Left;    Allergies  No Known Allergies  History of Present Illness    75 year old female with above past medical history including premature atrial contractions, atrial tachycardia, diabetes, hypertension, hyperlipidemia, obesity, prior tobacco abuse, and COPD on home O2.  She was previously evaluated in 2018 for possible atrial fibrillation.  Review of EKG however showed  sinus rhythm with PACs.  She underwent Zio monitoring in 2018 which showed sinus rhythm with 2 episodes of SVT, likely atrial tachycardia.  She was noted to have frequent PACs with a 13.5% burden.  She was placed on metoprolol therapy.  Stress test and echocardiogram were offered but patient declined.  She last followed up in clinic in July 2019, at which time she was doing well without any palpitations.  Beta-blocker therapy was weaned off.  She notes that she had remained active, but due to progressive DJD, she underwent left total knee arthroplasty in February.  She required revision in June.  In this setting, she is walking less but still getting about 2500 steps a day.  She is using a cane when she is out in public.    She has a history of somewhat difficult to control hypertension.  In this setting, she was placed on amlodipine which was subsequently titrated to 10 mg daily in August.  Since then, she has noted bothersome bilateral lower extremity swelling.  She says that sometimes her legs feel very tight and are sore.  She was recently seen by primary care with complaints of lower extremity swelling and ongoing hypertension, and she was placed on HCTZ 12.5 mg daily.  Since then, she has been having blood pressures in the 150s at home, but has not noticed any significant change in lower extremity swelling.  She continues to exercise most days of the week and denies chest pain or dyspnea.  She occasionally notes brief episodes of  palpitations.  She denies PND, orthopnea, dizziness, syncope, or early satiety.  Home Medications    Current Outpatient Medications  Medication Sig Dispense Refill   acetaminophen (TYLENOL) 500 MG tablet Take 1,000-2,500 mg by mouth 2 (two) times daily as needed for moderate pain.     albuterol (VENTOLIN HFA) 108 (90 Base) MCG/ACT inhaler Inhale 2 puffs into the lungs every 6 (six) hours as needed for wheezing or shortness of breath. 1 each 2   amLODipine (NORVASC) 10 MG  tablet Take 1 tablet (10 mg total) by mouth daily. 90 tablet 1   Apremilast (OTEZLA) 30 MG TABS Take by mouth.     aspirin 81 MG chewable tablet Chew 1 tablet (81 mg total) by mouth 2 (two) times daily. 60 tablet 0   enalapril (VASOTEC) 20 MG tablet Take 1 tablet (20 mg total) by mouth 2 (two) times daily. 180 tablet 1   glimepiride (AMARYL) 4 MG tablet Take 1 tablet (4 mg total) by mouth daily with breakfast. 90 tablet 1   hydrochlorothiazide (HYDRODIURIL) 12.5 MG tablet Take 1 tablet (12.5 mg total) by mouth daily. 30 tablet 0   HYDROcodone-acetaminophen (NORCO/VICODIN) 5-325 MG tablet Take 1 tablet by mouth every 6 (six) hours as needed.     ipratropium-albuterol (DUONEB) 0.5-2.5 (3) MG/3ML SOLN Take 3 mLs by nebulization 3 (three) times daily as needed (for SOB/cough/wheeze COPD exacerbation). 180 mL 0   loratadine (CLARITIN) 10 MG tablet Take 1 tablet (10 mg total) by mouth daily. 30 tablet 11   metoprolol succinate (TOPROL-XL) 25 MG 24 hr tablet Take 0.5 tablets (12.5 mg total) by mouth daily. Take with or immediately following a meal. 30 tablet 0   OneTouch Delica Lancets 33G MISC 1 Bottle by Does not apply route in the morning, at noon, and at bedtime. 100 each 11   oxyCODONE (ROXICODONE) 5 MG immediate release tablet Take 1 tablet (5 mg total) by mouth every 6 (six) hours as needed for severe pain. 20 tablet 0   pantoprazole (PROTONIX) 20 MG tablet Take 1 tablet (20 mg total) by mouth daily. 90 tablet 3   pravastatin (PRAVACHOL) 80 MG tablet Take 1 tablet (80 mg total) by mouth daily. 90 tablet 3   No current facility-administered medications for this visit.     Family History    Family History  Problem Relation Age of Onset   Leukemia Mother    Pneumonia Father    COPD Father    Diabetes Brother    Cancer Brother        lung cancer   Diabetes Sister    She indicated that her mother is deceased. She indicated that her father is deceased. She indicated that all of her three  sisters are alive. She indicated that her brother is alive. She indicated that her maternal grandmother is deceased. She indicated that her maternal grandfather is deceased. She indicated that her paternal grandmother is deceased. She indicated that her paternal grandfather is deceased. She indicated that her daughter is alive. She indicated that only one of her two sons is alive.  Social History    Social History   Socioeconomic History   Marital status: Widowed    Spouse name: Not on file   Number of children: Not on file   Years of education: Not on file   Highest education level: Not on file  Occupational History   Not on file  Tobacco Use   Smoking status: Former    Packs/day: 2.00  Years: 30.00    Pack years: 60.00    Types: Cigarettes   Smokeless tobacco: Never   Tobacco comments:    quit 2006  Vaping Use   Vaping Use: Never used  Substance and Sexual Activity   Alcohol use: No    Alcohol/week: 0.0 standard drinks   Drug use: No   Sexual activity: Not Currently  Other Topics Concern   Not on file  Social History Narrative   Not on file   Social Determinants of Health   Financial Resource Strain: Not on file  Food Insecurity: Not on file  Transportation Needs: Not on file  Physical Activity: Not on file  Stress: Not on file  Social Connections: Not on file  Intimate Partner Violence: Not on file     Review of Systems    General:  No chills, fever, night sweats or weight changes.  Cardiovascular:  +++ Occasional palpitations, no chest pain, dyspnea on exertion, +++ bilateral lower extremity edema-worsened since amlodipine titrated in August, no orthopnea, paroxysmal nocturnal dyspnea. Dermatological: No rash, lesions/masses Respiratory: No cough, dyspnea Urologic: No hematuria, dysuria Abdominal:   No nausea, vomiting, diarrhea, bright red blood per rectum, melena, or hematemesis Neurologic:  No visual changes, wkns, changes in mental status. All other  systems reviewed and are otherwise negative except as noted above.  Physical Exam    VS:  BP (!) 162/74   Pulse 82   Ht 5\' 4"  (1.626 m)   Wt 207 lb 3.2 oz (94 kg)   SpO2 96%   BMI 35.57 kg/m  , BMI Body mass index is 35.57 kg/m.     GEN: Well nourished, well developed, in no acute distress. HEENT: normal. Neck: Supple, no JVD, carotid bruits, or masses. Cardiac: RRR, 2/6 systolic ejection murmur loudest at the right upper sternal border, no rubs, or gallops. No clubbing, cyanosis.  1-2+ bilateral lower extremity edema to the lower calves with associated tenderness.  Radials/PT 2+ and equal bilaterally.  Respiratory:  Respirations regular and unlabored, clear to auscultation bilaterally. GI: Obese, soft, nontender, nondistended, BS + x 4. MS: no deformity or atrophy. Skin: warm and dry, no rash. Neuro:  Strength and sensation are intact. Psych: Normal affect.  Accessory Clinical Findings    ECG personally reviewed by me today-sinus rhythm with sinus arrhythmia - No acute changes  Lab Results  Component Value Date   WBC 11.0 (H) 11/08/2020   HGB 12.8 11/08/2020   HCT 40.8 11/08/2020   MCV 85.0 11/08/2020   PLT 433 (H) 11/08/2020   Lab Results  Component Value Date   CREATININE 1.09 (H) 11/08/2020   BUN 21 11/08/2020   NA 139 11/08/2020   K 5.0 11/08/2020   CL 103 11/08/2020   CO2 29 11/08/2020   Lab Results  Component Value Date   ALT 4 (L) 11/08/2020   AST 11 11/08/2020   ALKPHOS 87 07/29/2016   BILITOT 0.5 11/08/2020   Lab Results  Component Value Date   CHOL 152 11/08/2020   HDL 58 11/08/2020   LDLCALC 75 11/08/2020   TRIG 107 11/08/2020   CHOLHDL 2.6 11/08/2020    Lab Results  Component Value Date   HGBA1C 7.2 (H) 11/08/2020    Assessment & Plan   1.  Essential hypertension: Patient with a history of hypertension has been somewhat difficult to manage recently.  She is currently on Toprol-XL 12.5 mg daily, enalapril 20 mg twice daily, amlodipine 10  mg daily, and most recently HCTZ  12.5 mg daily.  Since increase of amlodipine to 10 mg daily in August, she has noted significant bilateral lower extremity swelling, which has been bothersome.  She does not experience dyspnea on exertion or chest pain.  I suspect amlodipine is contributing to lower extremity edema.  She notes that she previously tolerated a lower dose without swelling.  I am going to reduce amlodipine to 5 mg daily.  In the setting of ongoing hypertension, I will discontinue metoprolol in favor of carvedilol for improved blood pressure lowering.  We will have to continue to follow heart rates as she did have some bradycardia on Toprol-XL 25 mg daily, previously.  As she was recently placed on hydrochlorothiazide, I will follow-up a basic metabolic panel today.  If swelling does not improve with reduction in amlodipine, will consider titration of HCTZ to 25 mg daily, provided renal function stable.  Patient will continue to follow blood pressure at home and I will see her back in about 2 weeks.  2.  Lower extremity edema: As noted above, this seems to have worsened following titration of amlodipine to 10 mg daily in August.  She denies dyspnea, PND, or orthopnea.  Lungs are clear on examination.  Reducing amlodipine to 5 mg daily, as above.  She believes that she tolerated that dose previously.  May need to consider further down titration/discontinuation if swelling persists.  She does have a systolic murmur on examination, which she notes is old.  In the setting of systolic murmur and also lower extremity edema, I will arrange for follow-up echocardiogram, as it does not appear that she had one previously.  3.  Hyperlipidemia: LDL of 75 in July.  LFTs okay at that time.  Continue statin therapy.  4.  Type 2 diabetes mellitus: A1c 7.2.  Management per internal medicine.  5.  COPD: Patient uses oxygen at night but does well throughout the day.  She denies dyspnea on exertion.  She is not  wheezing on examination today.  She uses albuterol as needed.  6.  Disposition: Follow-up basic metabolic panel today.  Follow-up echocardiogram.  Follow-up in clinic in 2 to 3 weeks or sooner if necessary.  Julie Ducking, NP 01/06/2021, 8:56 AM

## 2021-01-06 NOTE — Patient Instructions (Addendum)
Medication Instructions:   DECREASE Amlodipine 5 mg once a day STOP Metoprolol (Toprol XL) CHANGE Carvedilol to 6.25 mg twice a day   *If you need a refill on your cardiac medications before your next appointment, please call your pharmacy*   Lab Work: BMET today  If you have labs (blood work) drawn today and your tests are completely normal, you will receive your results only by: MyChart Message (if you have MyChart) OR A paper copy in the mail If you have any lab test that is abnormal or we need to change your treatment, we will call you to review the results.   Testing/Procedures: Your physician has requested that you have an echocardiogram.   Echocardiography is a painless test that uses sound waves to create images of your heart. It provides your doctor with information about the size and shape of your heart and how well your heart's chambers and valves are working. This procedure takes approximately one hour. There are no restrictions for this procedure.    Follow-Up: At [facility], you and your health needs are our priority.  As part of our continuing mission to provide you with exceptional heart care, we have created designated Provider Care Teams.  These Care Teams include your primary Cardiologist (physician) and Advanced Practice Providers (APPs -  Physician Assistants and Nurse Practitioners) who all work together to provide you with the care you need, when you need it.   Your next appointment:   2-3  week(s)  The format for your next appointment:   In Person  Provider:   Nicolasa Ducking, NP

## 2021-01-07 LAB — BASIC METABOLIC PANEL
BUN/Creatinine Ratio: 19 (ref 12–28)
BUN: 21 mg/dL (ref 8–27)
CO2: 24 mmol/L (ref 20–29)
Calcium: 10.2 mg/dL (ref 8.7–10.3)
Chloride: 99 mmol/L (ref 96–106)
Creatinine, Ser: 1.13 mg/dL — ABNORMAL HIGH (ref 0.57–1.00)
Glucose: 158 mg/dL — ABNORMAL HIGH (ref 65–99)
Potassium: 4.4 mmol/L (ref 3.5–5.2)
Sodium: 137 mmol/L (ref 134–144)
eGFR: 50 mL/min/{1.73_m2} — ABNORMAL LOW (ref >59)

## 2021-01-08 ENCOUNTER — Ambulatory Visit (INDEPENDENT_AMBULATORY_CARE_PROVIDER_SITE_OTHER): Payer: Medicare Other

## 2021-01-08 ENCOUNTER — Other Ambulatory Visit: Payer: Self-pay | Admitting: Family Medicine

## 2021-01-08 ENCOUNTER — Other Ambulatory Visit: Payer: Self-pay

## 2021-01-08 DIAGNOSIS — I10 Essential (primary) hypertension: Secondary | ICD-10-CM

## 2021-01-08 DIAGNOSIS — R011 Cardiac murmur, unspecified: Secondary | ICD-10-CM

## 2021-01-08 LAB — ECHOCARDIOGRAM COMPLETE
AR max vel: 3.37 cm2
AV Area VTI: 3.76 cm2
AV Area mean vel: 3.48 cm2
AV Mean grad: 6 mmHg
AV Peak grad: 10.7 mmHg
Ao pk vel: 1.64 m/s

## 2021-01-08 MED ORDER — PERFLUTREN LIPID MICROSPHERE
1.0000 mL | INTRAVENOUS | Status: AC | PRN
Start: 2021-01-08 — End: 2021-01-08
  Administered 2021-01-08: 2 mL via INTRAVENOUS

## 2021-01-09 ENCOUNTER — Telehealth: Payer: Self-pay | Admitting: *Deleted

## 2021-01-09 NOTE — Progress Notes (Deleted)
   Note reviewed re: ongoing swelling despite lower dose of eliquis (see echo result note dated 01/09/2021).  Ok for pt to stop amlodipine.  Please have her call us w/ BP numbers over the next week, as we are likely to have to add another agent in it's place, but we just increased carvedilol.  Otw, plan to f/u 10/3.  Nicolasa Ducking, NP 01/09/2021, 3:53 PM

## 2021-01-09 NOTE — Telephone Encounter (Addendum)
Cambrea, Kirt - 01/08/21 Creig Hines, NP  Sent: Thu January 09, 2021  3:57 PM    Result Note  Ok to stop amlodipine.  Follow bp at home and pls call us next week w/ numbers, since f/u isn't until 10/3.    Spoke with patient and reviewed provider recommendations and confirmed upcoming appointment. Instructed her to continue compression hose, leg elevation, and other measures we spoke of in earlier call. Advised to monitor blood pressures and if they become persistently elevated to please call back with those readings to monitor. Confirmed upcoming appointment. She verbalized understanding of our conversation, agreement with plan, and had no further questions at this time. She did request that I send her this information via My Chart.  Appointment on 01/20/21 with APP at 10:05 am.

## 2021-01-14 DIAGNOSIS — H524 Presbyopia: Secondary | ICD-10-CM | POA: Diagnosis not present

## 2021-01-14 DIAGNOSIS — E119 Type 2 diabetes mellitus without complications: Secondary | ICD-10-CM | POA: Diagnosis not present

## 2021-01-14 LAB — HM DIABETES EYE EXAM

## 2021-01-15 ENCOUNTER — Encounter: Payer: Self-pay | Admitting: Family Medicine

## 2021-01-15 DIAGNOSIS — J449 Chronic obstructive pulmonary disease, unspecified: Secondary | ICD-10-CM | POA: Diagnosis not present

## 2021-01-20 ENCOUNTER — Ambulatory Visit: Payer: Medicare Other | Admitting: Nurse Practitioner

## 2021-01-20 ENCOUNTER — Encounter: Payer: Self-pay | Admitting: Nurse Practitioner

## 2021-01-20 ENCOUNTER — Other Ambulatory Visit: Payer: Self-pay

## 2021-01-20 VITALS — BP 138/64 | HR 78 | Ht 60.5 in | Wt 205.4 lb

## 2021-01-20 DIAGNOSIS — E1121 Type 2 diabetes mellitus with diabetic nephropathy: Secondary | ICD-10-CM

## 2021-01-20 DIAGNOSIS — E782 Mixed hyperlipidemia: Secondary | ICD-10-CM

## 2021-01-20 DIAGNOSIS — I10 Essential (primary) hypertension: Secondary | ICD-10-CM | POA: Diagnosis not present

## 2021-01-20 DIAGNOSIS — R600 Localized edema: Secondary | ICD-10-CM

## 2021-01-20 DIAGNOSIS — J439 Emphysema, unspecified: Secondary | ICD-10-CM

## 2021-01-20 MED ORDER — HYDROCHLOROTHIAZIDE 25 MG PO TABS: 25.0000 mg | ORAL_TABLET | Freq: Every day | ORAL | 3 refills | Status: DC

## 2021-01-20 NOTE — Patient Instructions (Addendum)
Medication Instructions:  Your physician has recommended you make the following change in your medication:   INCREASE Hydrochlorothiazide 25 mg once daily   *If you need a refill on your cardiac medications before your next appointment, please call your pharmacy*   Lab Work: BMET in 1 week  If you have labs (blood work) drawn today and your tests are completely normal, you will receive your results only by: MyChart Message (if you have MyChart) OR A paper copy in the mail If you have any lab test that is abnormal or we need to change your treatment, we will call you to review the results.   Testing/Procedures: None   Follow-Up: At [facility], you and your health needs are our priority.  As part of our continuing mission to provide you with exceptional heart care, we have created designated Provider Care Teams.  These Care Teams include your primary Cardiologist (physician) and Advanced Practice Providers (APPs -  Physician Assistants and Nurse Practitioners) who all work together to provide you with the care you need, when you need it.   Your next appointment:   3 month(s)  The format for your next appointment:   In Person  Provider:   Lorine Bears, MD or Nicolasa Ducking, NP

## 2021-01-20 NOTE — Progress Notes (Signed)
Office Visit    Patient Name: Julie Keith Date of Encounter: 01/20/2021  Primary Care Provider:  Danelle Berry, PA-C Primary Cardiologist:  Lorine Bears, MD  Chief Complaint    75 year old female with a history of premature atrial contractions and atrial tachycardia, diabetes, hypertension, hyperlipidemia, obesity, prior tobacco abuse, and COPD, presents for follow-up related to hypertension and lower extremity edema.  Past Medical History    Past Medical History:  Diagnosis Date   Allergic rhinitis    Diabetes (HCC)    Dyspnea    Emphysema lung (HCC)    GERD (gastroesophageal reflux disease) 10/10/2015   History of echocardiogram    a. 12/2020 Echo: EF 60-65%, no rwma, Nl RV size/fxn, mildly dil LA. No significant valvular dzs.   Hx of tobacco use, presenting hazards to health 04/08/2016   Hypercholesteremia    Hypertension    Palpitations    a. 07/2016 Zio: RSR, avg HR 67 (50-126), 2 SVT episodes, likely A tach - fastest 12 beats @ 154. 40,027 isolated PACs (13.5% burden).   Premature atrial contractions    Psoriasis 10/10/2015   Past Surgical History:  Procedure Laterality Date   ABDOMINAL HYSTERECTOMY     BACK SURGERY     CERVICAL SPINE SURGERY     CHOLECYSTECTOMY     KNEE CLOSED REDUCTION Left 10/14/2020   Procedure: CLOSED MANIPULATION KNEE UNDER ANESTHESIA;  Surgeon: Lyndle Herrlich, MD;  Location: [facility] ORS;  Service: Orthopedics;  Laterality: Left;   MASTECTOMY Bilateral    SPINAL CORD STIMULATOR BATTERY EXCHANGE     Osteostimulator in back   TOTAL KNEE ARTHROPLASTY Left 06/17/2020   Procedure: TOTAL KNEE ARTHROPLASTY;  Surgeon: Lyndle Herrlich, MD;  Location: [facility] ORS;  Service: Orthopedics;  Laterality: Left;    Allergies  No Known Allergies  History of Present Illness    75 year old female with the above past medical history including premature atrial contractions, atrial tachycardia, diabetes, hypertension, hyperlipidemia, obesity, prior tobacco  abuse, and COPD on home O2.  She was previously evaluated in 2018 for possible atrial fibrillation.  Review of EKG however showed sinus rhythm with PACs.  She underwent Zio monitoring in 2018, which showed sinus rhythm with 2 episodes of SVT, likely atrial tachycardia.  She was noted to have frequent PACs with a 13.5% burden.  She was placed on metoprolol therapy.  Stress test and echocardiogram were offered but patient declined.    Julie Keith was last seen in cardiology clinic in September 2022, at which time she reported increasing lower extremity edema in the setting of titration of amlodipine therapy.  She had recent been placed on HCTZ 12.5 mg daily.  Blood pressures were running in the 150s.  She had been on Toprol-XL 12.5 mg daily (dose limited by bradycardia) and this was switched to carvedilol 6.25 mg twice daily for improved blood pressure control.  Amlodipine dose was reduced to 5 mg daily.  Patient phoned our office to report that she has continued to have lower extremity swelling and amlodipine was discontinued.  Since then, swelling has more or less resolved.  Blood pressures at home have been trending in the 130s to 140s for the most part.  She is 138/64 today.  She is feeling much better and her weight is down 2 pounds since her last visit.  Activity remains limited following knee surgery earlier this year though she is looking forward to resuming more regular exercise and getting her steps back up to about 5000/day,  which she was at preoperatively.  She denies chest pain, dyspnea, palpitations, PND, orthopnea, dizziness, syncope, or early satiety.  Home Medications    Current Outpatient Medications  Medication Sig Dispense Refill   acetaminophen (TYLENOL) 500 MG tablet Take 1,000-2,500 mg by mouth 2 (two) times daily as needed for moderate pain.     albuterol (VENTOLIN HFA) 108 (90 Base) MCG/ACT inhaler Inhale 2 puffs into the lungs every 6 (six) hours as needed for wheezing or shortness  of breath. 1 each 2   Apremilast (OTEZLA) 30 MG TABS Take by mouth.     aspirin 81 MG chewable tablet Chew 1 tablet (81 mg total) by mouth 2 (two) times daily. 60 tablet 0   carvedilol (COREG) 6.25 MG tablet Take 1 tablet (6.25 mg total) by mouth 2 (two) times daily. 180 tablet 3   enalapril (VASOTEC) 20 MG tablet Take 1 tablet (20 mg total) by mouth 2 (two) times daily. 180 tablet 1   glimepiride (AMARYL) 4 MG tablet Take 1 tablet (4 mg total) by mouth daily with breakfast. 90 tablet 1   HYDROcodone-acetaminophen (NORCO/VICODIN) 5-325 MG tablet Take 1 tablet by mouth every 6 (six) hours as needed.     ipratropium-albuterol (DUONEB) 0.5-2.5 (3) MG/3ML SOLN Take 3 mLs by nebulization 3 (three) times daily as needed (for SOB/cough/wheeze COPD exacerbation). 180 mL 0   loratadine (CLARITIN) 10 MG tablet Take 1 tablet (10 mg total) by mouth daily. 30 tablet 11   OneTouch Delica Lancets 33G MISC 1 Bottle by Does not apply route in the morning, at noon, and at bedtime. 100 each 11   ONETOUCH ULTRA test strip USE 1  TO CHECK GLUCOSE IN THE MORNING, AT NOON, AND AT BEDTIME 100 each 0   pantoprazole (PROTONIX) 20 MG tablet Take 1 tablet (20 mg total) by mouth daily. 90 tablet 3   pravastatin (PRAVACHOL) 80 MG tablet Take 1 tablet (80 mg total) by mouth daily. 90 tablet 3   hydrochlorothiazide (HYDRODIURIL) 12.5 MG tablet Take 1 tablet (12.5 mg total) by mouth daily. (Patient not taking: Reported on 01/20/2021) 30 tablet 0   oxyCODONE (ROXICODONE) 5 MG immediate release tablet Take 1 tablet (5 mg total) by mouth every 6 (six) hours as needed for severe pain. (Patient not taking: Reported on 01/20/2021) 20 tablet 0   No current facility-administered medications for this visit.     Review of Systems    Lower extremity swelling almost completely resolved-just mild edema today.  She denies chest pain, dyspnea, palpitations, PND, orthopnea, dizziness, syncope, or early satiety.  All other systems reviewed and  are otherwise negative except as noted above.  Physical Exam    VS:  BP 138/64 (BP Location: Left Arm, Patient Position: Sitting, Cuff Size: Normal)   Pulse 78   Ht 5' 0.5" (1.537 m)   Wt 205 lb 6 oz (93.2 kg)   SpO2 91%   BMI 39.45 kg/m  , BMI Body mass index is 39.45 kg/m.     GEN: Well nourished, well developed, in no acute distress. HEENT: normal. Neck: Supple, no JVD, carotid bruits, or masses. Cardiac: RRR, 1/6 systolic murmur at the upper sternal border, no rubs, or gallops. No clubbing, cyanosis, trace bilateral lower extremity/ankle edema.  Radials/PT 2+ and equal bilaterally.  Respiratory:  Respirations regular and unlabored, clear to auscultation bilaterally. GI: Obese, soft, nontender, nondistended, BS + x 4. MS: no deformity or atrophy. Skin: warm and dry, no rash. Neuro:  Strength and sensation are  intact. Psych: Normal affect.  Accessory Clinical Findings     Lab Results  Component Value Date   WBC 11.0 (H) 11/08/2020   HGB 12.8 11/08/2020   HCT 40.8 11/08/2020   MCV 85.0 11/08/2020   PLT 433 (H) 11/08/2020   Lab Results  Component Value Date   CREATININE 1.13 (H) 01/06/2021   BUN 21 01/06/2021   NA 137 01/06/2021   K 4.4 01/06/2021   CL 99 01/06/2021   CO2 24 01/06/2021   Lab Results  Component Value Date   ALT 4 (L) 11/08/2020   AST 11 11/08/2020   ALKPHOS 87 07/29/2016   BILITOT 0.5 11/08/2020   Lab Results  Component Value Date   CHOL 152 11/08/2020   HDL 58 11/08/2020   LDLCALC 75 11/08/2020   TRIG 107 11/08/2020   CHOLHDL 2.6 11/08/2020    Lab Results  Component Value Date   HGBA1C 7.2 (H) 11/08/2020    Assessment & Plan    1.  Essential hypertension: Patient with a history of hypertension but has been somewhat difficult to manage.  In the setting of significant lower extremity edema following escalation of amlodipine therapy, I discontinued amlodipine and replaced her Toprol-XL with low-dose carvedilol.  Since then, swelling has  almost completely resolved and blood pressures have actually improved, trending in the 130s to 140s for the most part at home.  She ran out of her hydrochlorothiazide the other day.  I am going to refill this at 25 mg daily and plan to follow-up a basic metabolic panel next week.  She otherwise remains on enalapril 20 mg twice daily.  2.  Lower extremity edema: More or less resolved following discontinuation of amlodipine.  I have added amlodipine to her list of medication intolerances.  Echocardiogram showed normal LV function without any significant valvular disease.  Reassurance offered.  She still has mild edema and ongoing hypertension, I am increasing her hydrochlorothiazide to 25 mg daily with plan for follow-up basic metabolic panel next week.  3.  Hyperlipidemia: LDL of 75 in July.  LFTs okay at that time.  Continue statin therapy.  4.  Type 2 diabetes mellitus: A1c 7.2 in July.  Management per internal medicine.  5.  COPD: Quit smoking in the early 2000's.  She uses oxygen at night.  Denies dyspnea on exertion and is not wheezing on examination today.  6.  Disposition: Follow-up basic metabolic panel in 1 week.  Follow-up in clinic in 3 months or sooner if necessary.   Nicolasa Ducking, NP 01/20/2021, 9:59 AM

## 2021-01-27 ENCOUNTER — Other Ambulatory Visit (INDEPENDENT_AMBULATORY_CARE_PROVIDER_SITE_OTHER): Payer: Medicare Other

## 2021-01-27 ENCOUNTER — Other Ambulatory Visit: Payer: Self-pay

## 2021-01-27 DIAGNOSIS — R600 Localized edema: Secondary | ICD-10-CM

## 2021-01-27 DIAGNOSIS — I10 Essential (primary) hypertension: Secondary | ICD-10-CM

## 2021-01-28 ENCOUNTER — Telehealth: Payer: Self-pay | Admitting: Nurse Practitioner

## 2021-01-28 DIAGNOSIS — E875 Hyperkalemia: Secondary | ICD-10-CM

## 2021-01-28 LAB — BASIC METABOLIC PANEL
BUN/Creatinine Ratio: 23 (ref 12–28)
BUN: 27 mg/dL (ref 8–27)
CO2: 26 mmol/L (ref 20–29)
Calcium: 9.7 mg/dL (ref 8.7–10.3)
Chloride: 99 mmol/L (ref 96–106)
Creatinine, Ser: 1.15 mg/dL — ABNORMAL HIGH (ref 0.57–1.00)
Glucose: 164 mg/dL — ABNORMAL HIGH (ref 70–99)
Potassium: 5.3 mmol/L — ABNORMAL HIGH (ref 3.5–5.2)
Sodium: 140 mmol/L (ref 134–144)
eGFR: 49 mL/min/{1.73_m2} — ABNORMAL LOW (ref >59)

## 2021-01-28 NOTE — Telephone Encounter (Signed)
Patient made aware of lab results and Ward Givens, NP recommendation with verbalized understanding. Patient sts that she does not eat high potassium foods, take supplements containing potassium, or use salt substitute products. 2 week lab appt scheduled for repeat bmp. On 02/11/21 @ 8am.

## 2021-01-28 NOTE — Telephone Encounter (Signed)
Creig Hines, NP  P Cv Div [place] Triage Kidney function stable.  Potassium is mildly elevated.  She has a history of similar mild elevations.  She should avoid high potassium containing foods.  F/u bmet in 2 wks in order to ensure stability.

## 2021-02-11 ENCOUNTER — Ambulatory Visit: Payer: Medicare Other

## 2021-02-11 DIAGNOSIS — L400 Psoriasis vulgaris: Secondary | ICD-10-CM | POA: Diagnosis not present

## 2021-02-11 DIAGNOSIS — Z79899 Other long term (current) drug therapy: Secondary | ICD-10-CM | POA: Diagnosis not present

## 2021-02-11 DIAGNOSIS — L853 Xerosis cutis: Secondary | ICD-10-CM | POA: Diagnosis not present

## 2021-02-11 DIAGNOSIS — L570 Actinic keratosis: Secondary | ICD-10-CM | POA: Diagnosis not present

## 2021-02-12 ENCOUNTER — Other Ambulatory Visit (INDEPENDENT_AMBULATORY_CARE_PROVIDER_SITE_OTHER): Payer: Medicare Other

## 2021-02-12 ENCOUNTER — Other Ambulatory Visit: Payer: Self-pay

## 2021-02-12 DIAGNOSIS — E875 Hyperkalemia: Secondary | ICD-10-CM

## 2021-02-13 LAB — BASIC METABOLIC PANEL
BUN/Creatinine Ratio: 20 (ref 12–28)
BUN: 27 mg/dL (ref 8–27)
CO2: 25 mmol/L (ref 20–29)
Calcium: 9.3 mg/dL (ref 8.7–10.3)
Chloride: 99 mmol/L (ref 96–106)
Creatinine, Ser: 1.32 mg/dL — ABNORMAL HIGH (ref 0.57–1.00)
Glucose: 217 mg/dL — ABNORMAL HIGH (ref 70–99)
Potassium: 4.6 mmol/L (ref 3.5–5.2)
Sodium: 139 mmol/L (ref 134–144)
eGFR: 42 mL/min/{1.73_m2} — ABNORMAL LOW (ref >59)

## 2021-02-14 ENCOUNTER — Other Ambulatory Visit: Payer: Self-pay | Admitting: *Deleted

## 2021-02-14 DIAGNOSIS — R011 Cardiac murmur, unspecified: Secondary | ICD-10-CM

## 2021-02-14 DIAGNOSIS — J449 Chronic obstructive pulmonary disease, unspecified: Secondary | ICD-10-CM | POA: Diagnosis not present

## 2021-02-14 DIAGNOSIS — N183 Chronic kidney disease, stage 3 unspecified: Secondary | ICD-10-CM

## 2021-03-03 ENCOUNTER — Other Ambulatory Visit: Payer: Medicare Other

## 2021-03-11 ENCOUNTER — Encounter: Payer: Self-pay | Admitting: Family Medicine

## 2021-03-11 ENCOUNTER — Other Ambulatory Visit: Payer: Self-pay

## 2021-03-11 ENCOUNTER — Ambulatory Visit (INDEPENDENT_AMBULATORY_CARE_PROVIDER_SITE_OTHER): Payer: Medicare Other | Admitting: Family Medicine

## 2021-03-11 VITALS — BP 122/60 | HR 70 | Temp 98.3°F | Resp 16 | Ht 61.0 in | Wt 202.4 lb

## 2021-03-11 DIAGNOSIS — K219 Gastro-esophageal reflux disease without esophagitis: Secondary | ICD-10-CM | POA: Diagnosis not present

## 2021-03-11 DIAGNOSIS — N1831 Chronic kidney disease, stage 3a: Secondary | ICD-10-CM

## 2021-03-11 DIAGNOSIS — I10 Essential (primary) hypertension: Secondary | ICD-10-CM | POA: Diagnosis not present

## 2021-03-11 DIAGNOSIS — D75839 Thrombocytosis, unspecified: Secondary | ICD-10-CM

## 2021-03-11 DIAGNOSIS — Z5181 Encounter for therapeutic drug level monitoring: Secondary | ICD-10-CM

## 2021-03-11 DIAGNOSIS — J439 Emphysema, unspecified: Secondary | ICD-10-CM

## 2021-03-11 DIAGNOSIS — D72829 Elevated white blood cell count, unspecified: Secondary | ICD-10-CM

## 2021-03-11 DIAGNOSIS — Z780 Asymptomatic menopausal state: Secondary | ICD-10-CM

## 2021-03-11 DIAGNOSIS — Z79899 Other long term (current) drug therapy: Secondary | ICD-10-CM

## 2021-03-11 DIAGNOSIS — E119 Type 2 diabetes mellitus without complications: Secondary | ICD-10-CM

## 2021-03-11 DIAGNOSIS — E782 Mixed hyperlipidemia: Secondary | ICD-10-CM | POA: Diagnosis not present

## 2021-03-11 DIAGNOSIS — D84821 Immunodeficiency due to drugs: Secondary | ICD-10-CM | POA: Insufficient documentation

## 2021-03-11 NOTE — Patient Instructions (Addendum)
Diabetes  Lab Results  Component Value Date   HGBA1C 7.2 (H) 11/08/2020   HGBA1C 6.9 (H) 06/17/2020   HGBA1C 7.2 (H) 04/15/2020   Return in the next 1 week here to [facility] to get your labs done Your kidney function and electrolytes need to be rechecked and I will send them to Cardiology who wanted them done a week or two ago

## 2021-03-11 NOTE — Progress Notes (Signed)
Name: Julie Keith   MRN: [id number]    DOB: 01-23-1946   Date:03/11/2021       Progress Note  Chief Complaint  Patient presents with   Follow-up   Hypertension   Hyperlipidemia   Gastroesophageal Reflux   Diabetes     Subjective:   Julie Keith is a 75 y.o. female, presents to clinic for routine follow up  Hypertension:  Currently managed on carvedilol, enalapril, hydrochlorothiazide Pt reports good med compliance and denies any SE.   Blood pressure today is well controlled. BP Readings from Last 3 Encounters:  03/11/21 122/60  01/20/21 138/64  01/06/21 (!) 162/74  Medications primarily managed by cardiology Pt denies CP, SOB, exertional sx,  palpitation, Ha's, visual disturbances, lightheadedness, hypotension, syncope.   Hyperlipidemia: Currently treated with pravastatin 80 mg, pt reports good med compliance Last Lipids: Lab Results  Component Value Date   CHOL 152 11/08/2020   HDL 58 11/08/2020   LDLCALC 75 11/08/2020   TRIG 107 11/08/2020   CHOLHDL 2.6 11/08/2020   - Denies: Chest pain, shortness of breath, myalgias, claudication  Renal function declining with HTN and med changes - followed by cardiology - due for recheck BMP - does not want to do today Labs reviewed from July to October  Lab Results  Component Value Date   EGFR 42 (L) 02/12/2021   EGFR 49 (L) 01/27/2021   EGFR 50 (L) 01/06/2021   EGFR 53 (L) 11/08/2020    DM:   Pt managing DM with glimepriride 4 mg Blood sugars 112 in the am - rare low sugar episodes she will drink sunny D Denies: Polyuria, polydipsia, vision changes, neuropathy Recent pertinent labs: Lab Results  Component Value Date   HGBA1C 7.2 (H) 11/08/2020   HGBA1C 6.9 (H) 06/17/2020   HGBA1C 7.2 (H) 04/15/2020   Lab Results  Component Value Date   MICROALBUR 2.1 04/15/2020   LDLCALC 75 11/08/2020   CREATININE 1.32 (H) 02/12/2021   Standard of care and health maintenance: Foot exam:  done today  COPD -  reports sx are stable and well controlled, she stopped allergy medications and says she doesn't need them  GERD - reports well controlled Sx, no abd pain, trouble swallowing, change in appetite     Current Outpatient Medications:    acetaminophen (TYLENOL) 500 MG tablet, Take 1,000-2,500 mg by mouth 2 (two) times daily as needed for moderate pain., Disp: , Rfl:    albuterol (VENTOLIN HFA) 108 (90 Base) MCG/ACT inhaler, Inhale 2 puffs into the lungs every 6 (six) hours as needed for wheezing or shortness of breath., Disp: 1 each, Rfl: 2   Apremilast (OTEZLA) 30 MG TABS, Take by mouth., Disp: , Rfl:    aspirin 81 MG chewable tablet, Chew 1 tablet (81 mg total) by mouth 2 (two) times daily., Disp: 60 tablet, Rfl: 0   carvedilol (COREG) 6.25 MG tablet, Take 1 tablet (6.25 mg total) by mouth 2 (two) times daily., Disp: 180 tablet, Rfl: 3   enalapril (VASOTEC) 20 MG tablet, Take 1 tablet (20 mg total) by mouth 2 (two) times daily., Disp: 180 tablet, Rfl: 1   glimepiride (AMARYL) 4 MG tablet, Take 1 tablet (4 mg total) by mouth daily with breakfast., Disp: 90 tablet, Rfl: 1   hydrochlorothiazide (HYDRODIURIL) 25 MG tablet, Take 1 tablet (25 mg total) by mouth daily., Disp: 90 tablet, Rfl: 3   ipratropium-albuterol (DUONEB) 0.5-2.5 (3) MG/3ML SOLN, Take 3 mLs by nebulization 3 (three) times daily  as needed (for SOB/cough/wheeze COPD exacerbation)., Disp: 180 mL, Rfl: 0   OneTouch Delica Lancets 66Q MISC, 1 Bottle by Does not apply route in the morning, at noon, and at bedtime., Disp: 100 each, Rfl: 11   ONETOUCH ULTRA test strip, USE 1  TO CHECK GLUCOSE IN THE MORNING, AT NOON, AND AT BEDTIME, Disp: 100 each, Rfl: 0   pantoprazole (PROTONIX) 20 MG tablet, Take 1 tablet (20 mg total) by mouth daily., Disp: 90 tablet, Rfl: 3   pravastatin (PRAVACHOL) 80 MG tablet, Take 1 tablet (80 mg total) by mouth daily., Disp: 90 tablet, Rfl: 3  Patient Active Problem List   Diagnosis Date Noted   Immunosuppression  due to drug therapy ([place]) 03/11/2021   History of total knee replacement, left 06/17/2020   Osteoarthritis of knee 03/09/2020   Osteoarthritis of left knee 02/28/2020   Stage 3 chronic kidney disease ([place]) 09/21/2019   Psoriasis 10/10/2015   GERD (gastroesophageal reflux disease) 10/10/2015   Type 2 diabetes mellitus without complication, without long-term current use of insulin ([place]) 10/31/2014   Essential hypertension 10/31/2014   Hyperlipidemia 10/31/2014   Morbid obesity ([place]) 10/31/2014   Pulmonary emphysema ([place]) 06/28/2014    Past Surgical History:  Procedure Laterality Date   ABDOMINAL HYSTERECTOMY     BACK SURGERY     CERVICAL SPINE SURGERY     CHOLECYSTECTOMY     KNEE CLOSED REDUCTION Left 10/14/2020   Procedure: CLOSED MANIPULATION KNEE UNDER ANESTHESIA;  Surgeon: Lovell Sheehan, MD;  Location: [facility] ORS;  Service: Orthopedics;  Laterality: Left;   MASTECTOMY Bilateral    SPINAL CORD STIMULATOR BATTERY EXCHANGE     Osteostimulator in back   TOTAL KNEE ARTHROPLASTY Left 06/17/2020   Procedure: TOTAL KNEE ARTHROPLASTY;  Surgeon: Lovell Sheehan, MD;  Location: [facility] OR[facility];  Service: Orthopedics;  Laterality: Left;    Family History  Problem Relation Age of Onset   Leukemia Mother    Pneumonia Father    COPD Father    Diabetes Brother    Cancer Brother        lung cancer   Diabetes Sister     Social History   Tobacco Use   Smoking status: Former    Packs/day: 2.00    Years: 30.00    Pack years: 60.00    Types: Cigarettes   Smokeless tobacco: Never   Tobacco comments:    quit 2006  Vaping Use   Vaping Use: Never used  Substance Use Topics   Alcohol use: No    Alcohol/week: 0.0 standard drinks   Drug use: No     Allergies  Allergen Reactions   Amlodipine     Lower extremity swelling   Metformin And Related     headaches    Health Maintenance  Topic Date Due   COVID-19 Vaccine (3 - Pfizer risk series) 03/27/2021 (Originally 05/08/2020)    TETANUS/TDAP  04/15/2021 (Originally 03/31/1965)   Zoster Vaccines- Shingrix (1 of 2) 06/11/2021 (Originally 03/31/1965)   DEXA SCAN  07/17/2021 (Originally 03/30/2009)   HEMOGLOBIN A1C  05/11/2021   OPHTHALMOLOGY EXAM  01/14/2022   FOOT EXAM  03/11/2022   Pneumonia Vaccine 72+ Years old  Completed   INFLUENZA VACCINE  Completed   Hepatitis C Screening  Completed   HPV VACCINES  Aged Out   MAMMOGRAM  Discontinued    Chart Review Today: I personally reviewed active problem list, medication list, allergies, family history, social history, health maintenance, notes from last encounter, lab results,  imaging with the patient/caregiver today.   Review of Systems  Constitutional: Negative.   HENT: Negative.    Eyes: Negative.   Respiratory: Negative.    Cardiovascular: Negative.   Gastrointestinal: Negative.   Endocrine: Negative.   Genitourinary: Negative.   Musculoskeletal:  Positive for arthralgias (chronic knee pain).  Skin: Negative.   Allergic/Immunologic: Negative.   Neurological: Negative.   Hematological: Negative.   Psychiatric/Behavioral: Negative.    All other systems reviewed and are negative.   Objective:   Vitals:   03/11/21 0845  BP: 122/60  Pulse: 70  Resp: 16  Temp: 98.3 F (36.8 C)  TempSrc: Oral  SpO2: 95%  Weight: 202 lb 6.4 oz (91.8 kg)  Height: 5' 1" (1.549 m)    Body mass index is 38.24 kg/m.  Physical Exam Vitals and nursing note reviewed.  Constitutional:      General: She is not in acute distress.    Appearance: Normal appearance. She is well-developed. She is morbidly obese. She is not ill-appearing, toxic-appearing or diaphoretic.     Interventions: Face mask in place.  HENT:     Head: Normocephalic and atraumatic.     Right Ear: External ear normal.     Left Ear: External ear normal.     Nose: Nose normal.     Mouth/Throat:     Pharynx: Uvula midline.  Eyes:     General: Lids are normal. No scleral icterus.       Right eye: No  discharge.        Left eye: No discharge.     Conjunctiva/sclera: Conjunctivae normal.     Pupils: Pupils are equal, round, and reactive to light.  Neck:     Trachea: Phonation normal. No tracheal deviation.  Cardiovascular:     Rate and Rhythm: Normal rate and regular rhythm.     Pulses: Normal pulses.          Radial pulses are 2+ on the right side and 2+ on the left side.       Posterior tibial pulses are 2+ on the right side and 2+ on the left side.     Heart sounds: Normal heart sounds. No murmur heard.   No friction rub. No gallop.     Comments: Mild non-pitting bilateral LE edema Pulmonary:     Effort: Pulmonary effort is normal. No respiratory distress.     Breath sounds: Normal breath sounds. No stridor. No wheezing, rhonchi or rales.  Chest:     Chest wall: No tenderness.  Abdominal:     General: Bowel sounds are normal. There is no distension.     Palpations: Abdomen is soft.     Tenderness: There is no abdominal tenderness. There is no guarding or rebound.  Musculoskeletal:        General: Swelling (right knee) present. No deformity. Normal range of motion.     Cervical back: Normal range of motion and neck supple.     Right lower leg: Edema present.     Left lower leg: Edema present.  Lymphadenopathy:     Cervical: No cervical adenopathy.  Skin:    General: Skin is warm and dry.     Capillary Refill: Capillary refill takes less than 2 seconds.     Coloration: Skin is not jaundiced or pale.     Findings: No rash.  Neurological:     Mental Status: She is alert and oriented to person, place, and time. Mental status is at baseline.  Sensory: No sensory deficit.     Motor: No abnormal muscle tone.     Gait: Gait abnormal (antalgic - walks with cane).  Psychiatric:        Mood and Affect: Mood normal.        Speech: Speech normal.        Behavior: Behavior normal. Behavior is cooperative.     Diabetic Foot Exam - Simple   Simple Foot Form Diabetic Foot exam  was performed with the following findings: Yes 03/11/2021  8:50 AM  Visual Inspection No deformities, no ulcerations, no other skin breakdown bilaterally: Yes Sensation Testing Intact to touch and monofilament testing bilaterally: Yes Pulse Check Posterior Tibialis and Dorsalis pulse intact bilaterally: Yes Comments No calluses, nails normal, trimmed, skin diffusely dry but no fissures erythema or swelling, sensation intact, pulses intact, diffuse reticular veins, no pedal edema, mild pretibial edema       Assessment & Plan:   Pt is a 75 y/o female, presents for routine f/up on chronic conditions today, due for labs but does not want to do them, states she will come next week.  Otherwise no new or acute concerns. She is recovering from knee surgery from earlier this year, gradually improving She recently lost her husband but feels in good spirits, her family is nearby and very supportive  1. Essential hypertension BP Readings from Last 3 Encounters:  03/11/21 122/60  01/20/21 138/64  01/06/21 (!) 162/74  BP at goal today, pt compliant with meds, multiple med changes by cardiology with some electrolyte abnormalities and gradual renal function decline over the past several months she is overdue for repeat BMP.  We reviewed her labs and medications today, she is tolerating all meds, compliant with meds and has no concerns or complaints I explained that I will also need a chemistry today and if she returns to do labs here in our office I will route them to cardiology Ignacia Bayley NP -so that she only has 1 blood draw)  - COMPLETE METABOLIC PANEL WITH GFR  2. Type 2 diabetes mellitus without complication, without long-term current use of insulin (HCC) Foot exam done today, she is up-to-date on diabetic eye exam Discussed my concerns with management with only glimepiride and the risk for hypoglycemia.  A1c is not at goal but I am comfortable with recent labs for her age I would rather allow  some permissive hyperglycemia then be too aggressive and cause hypoglycemia.  Encouraged her to follow-up in the office right away or contact us if she has more than an occasional hypoglycemic episode, reviewed risks of glimepiride, encouraged her to decrease the dose if having low blood sugars and to hold medication if not eating.  Also told her to prefer to change her management but at this time she would not like to change anything - COMPLETE METABOLIC PANEL WITH GFR - Hemoglobin A1C -last was 7.2 in July  3. Gastroesophageal reflux disease, unspecified whether esophagitis present Symptoms are well controlled on Protonix  4. Mixed hyperlipidemia Last lipids reviewed, done in July, lipids at goal, tolerating statin, good compliance - COMPLETE METABOLIC PANEL WITH GFR  5. Pulmonary emphysema, unspecified emphysema type ([place]) No recent exacerbation, well controlled with as needed albuterol rescue inhaler or nebulizer treatments - COMPLETE METABOLIC PANEL WITH GFR - CBC with Differential/Platelet  6. Morbid obesity ([place]) Multiple comorbidities including diabetes, hypertension, hyperlipidemia, osteoarthritis -associated with BMI of 38.24, limited mobility with recent surgery and OA however she is doing home exercises after  extensive postop PT from earlier this year  7. Thrombocytosis Stable, monitoring - CBC with Differential/Platelet  8. Leukocytosis, unspecified type Stable, monitoring - CBC with Differential/Platelet  9. Stage 3a chronic kidney disease ([place]) Decreasing renal function very gradually over the past several months, cardiology has a plan to adjust medications if she has further decline of GFR - COMPLETE METABOLIC PANEL WITH GFR  10. Immunosuppression due to drug therapy ([place]) On [place], monitoring basic labs - COMPLETE METABOLIC PANEL WITH GFR - CBC with Differential/Platelet  11. Encounter for medication monitoring  - COMPLETE METABOLIC PANEL WITH GFR - Hemoglobin  A1C - CBC with Differential/Platelet   Return for 4-6 month follow up DM, HTN, kidney disease .   Delsa Grana, PA-C 03/11/21 9:54 AM

## 2021-03-12 ENCOUNTER — Telehealth: Payer: Self-pay | Admitting: *Deleted

## 2021-03-12 NOTE — Chronic Care Management (AMB) (Signed)
  Chronic Care Management   Note  03/12/2021 Name: Julie Keith MRN: [id number] DOB: 02-19-1946  Julie Keith is a 75 y.o. year old female who is a primary care patient of Delsa Grana, [place]. I reached out to [name] by phone today in response to a referral sent by [name] PCP.  [name] was given information about Chronic Care Management services today including:  CCM service includes personalized support from designated clinical staff supervised by her physician, including individualized plan of care and coordination with other care providers 24/7 contact phone numbers for assistance for urgent and routine care needs. Service will only be billed when office clinical staff spend 20 minutes or more in a month to coordinate care. Only one practitioner may furnish and bill the service in a calendar month. The patient may stop CCM services at any time (effective at the end of the month) by phone call to the office staff. The patient is responsible for co-pay (up to 20% after annual deductible is met) if co-pay is required by the individual health plan.   Patient agreed to services and verbal consent obtained.   Follow up plan: Telephone appointment with care management team member scheduled for: 05/07/2020  Julian Hy, [place] Management  Direct Dial: [phone]

## 2021-03-17 DIAGNOSIS — E782 Mixed hyperlipidemia: Secondary | ICD-10-CM | POA: Diagnosis not present

## 2021-03-17 DIAGNOSIS — E119 Type 2 diabetes mellitus without complications: Secondary | ICD-10-CM | POA: Diagnosis not present

## 2021-03-17 DIAGNOSIS — J439 Emphysema, unspecified: Secondary | ICD-10-CM | POA: Diagnosis not present

## 2021-03-17 DIAGNOSIS — J449 Chronic obstructive pulmonary disease, unspecified: Secondary | ICD-10-CM | POA: Diagnosis not present

## 2021-03-17 DIAGNOSIS — I10 Essential (primary) hypertension: Secondary | ICD-10-CM | POA: Diagnosis not present

## 2021-03-18 LAB — COMPLETE METABOLIC PANEL WITH GFR
AG Ratio: 1.1 (calc) (ref 1.0–2.5)
ALT: 8 U/L (ref 6–29)
AST: 12 U/L (ref 10–35)
Albumin: 3.9 g/dL (ref 3.6–5.1)
Alkaline phosphatase (APISO): 82 U/L (ref 37–153)
BUN/Creatinine Ratio: 28 (calc) — ABNORMAL HIGH (ref 6–22)
BUN: 30 mg/dL — ABNORMAL HIGH (ref 7–25)
CO2: 32 mmol/L (ref 20–32)
Calcium: 9.3 mg/dL (ref 8.6–10.4)
Chloride: 99 mmol/L (ref 98–110)
Creat: 1.09 mg/dL — ABNORMAL HIGH (ref 0.60–1.00)
Globulin: 3.5 g/dL (calc) (ref 1.9–3.7)
Glucose, Bld: 188 mg/dL — ABNORMAL HIGH (ref 65–99)
Potassium: 4.6 mmol/L (ref 3.5–5.3)
Sodium: 138 mmol/L (ref 135–146)
Total Bilirubin: 0.5 mg/dL (ref 0.2–1.2)
Total Protein: 7.4 g/dL (ref 6.1–8.1)
eGFR: 53 mL/min/{1.73_m2} — ABNORMAL LOW (ref >=60)

## 2021-03-18 LAB — CBC WITH DIFFERENTIAL/PLATELET
Absolute Monocytes: 612 cells/uL (ref 200–950)
Basophils Absolute: 54 cells/uL (ref 0–200)
Basophils Relative: 0.6 %
Eosinophils Absolute: 162 cells/uL (ref 15–500)
Eosinophils Relative: 1.8 %
HCT: 39.6 % (ref 35.0–45.0)
Hemoglobin: 12.6 g/dL (ref 11.7–15.5)
Lymphs Abs: 2412 cells/uL (ref 850–3900)
MCH: 27.3 pg (ref 27.0–33.0)
MCHC: 31.8 g/dL — ABNORMAL LOW (ref 32.0–36.0)
MCV: 85.9 fL (ref 80.0–100.0)
MPV: 9.9 fL (ref 7.5–12.5)
Monocytes Relative: 6.8 %
Neutro Abs: 5760 cells/uL (ref 1500–7800)
Neutrophils Relative %: 64 %
Platelets: 374 10*3/uL (ref 140–400)
RBC: 4.61 10*6/uL (ref 3.80–5.10)
RDW: 13.4 % (ref 11.0–15.0)
Total Lymphocyte: 26.8 %
WBC: 9 10*3/uL (ref 3.8–10.8)

## 2021-03-18 LAB — HEMOGLOBIN A1C
Hgb A1c MFr Bld: 7 % of total Hgb — ABNORMAL HIGH (ref <5.7)
Mean Plasma Glucose: 154 mg/dL
eAG (mmol/L): 8.5 mmol/L

## 2021-04-04 ENCOUNTER — Other Ambulatory Visit: Payer: Self-pay | Admitting: Family Medicine

## 2021-04-04 DIAGNOSIS — I10 Essential (primary) hypertension: Secondary | ICD-10-CM

## 2021-04-16 DIAGNOSIS — J449 Chronic obstructive pulmonary disease, unspecified: Secondary | ICD-10-CM | POA: Diagnosis not present

## 2021-04-24 ENCOUNTER — Encounter: Payer: Self-pay | Admitting: Cardiovascular Disease

## 2021-04-24 ENCOUNTER — Other Ambulatory Visit: Payer: Self-pay

## 2021-04-24 ENCOUNTER — Ambulatory Visit: Payer: Medicare Other | Admitting: Cardiovascular Disease

## 2021-04-24 VITALS — BP 130/62 | HR 72 | Ht 65.0 in | Wt 206.2 lb

## 2021-04-24 DIAGNOSIS — I10 Essential (primary) hypertension: Secondary | ICD-10-CM | POA: Diagnosis not present

## 2021-04-24 DIAGNOSIS — E782 Mixed hyperlipidemia: Secondary | ICD-10-CM | POA: Diagnosis not present

## 2021-04-24 DIAGNOSIS — I491 Atrial premature depolarization: Secondary | ICD-10-CM

## 2021-04-24 NOTE — Progress Notes (Signed)
Cardiology Office Note   Date:  04/24/2021   ID:  Julie Keith, Julie Keith 02-09-1946, MRN [id number]  PCP:  Danelle Berry, PA-C  Cardiologist:   Lorine Bears, MD   Chief Complaint  Patient presents with   Other    3 Month f/u no complaints today.  Meds reviewed verbally with pt.      History of Present Illness: Julie Keith is a 76 y.o. female who presents for a follow-up visit regarding PACs. She has chronic medical conditions that include diabetes mellitus, hypertension, previous tobacco use and COPD.  She underwent a Holter monitor in 2018 which showed sinus rhythm with 2 episodes of supraventricular tachycardia likely atrial tachycardia. She was started on metoprolol. She was seen few months ago for increased lower extremity edema in the setting of amlodipine treatment.  Metoprolol was switched to carvedilol for better blood pressure control.  She was switched from amlodipine to hydrochlorothiazide.  She reports resolution of lower extremity edema.  She has been doing well with no chest pain or worsening dyspnea.  She continues to struggle with mobility related to left knee replacement and has not recovered completely.  She walks with a cane.  Her husband died last year of cancer.  She lives by herself.  Past Medical History:  Diagnosis Date   Allergic rhinitis    Diabetes (HCC)    Dyspnea    Emphysema lung (HCC)    GERD (gastroesophageal reflux disease) 10/10/2015   History of echocardiogram    a. 12/2020 Echo: EF 60-65%, no rwma, Nl RV size/fxn, mildly dil LA. No significant valvular dzs.   Hx of tobacco use, presenting hazards to health 04/08/2016   Hypercholesteremia    Hypertension    Palpitations    a. 07/2016 Zio: RSR, avg HR 67 (50-126), 2 SVT episodes, likely A tach - fastest 12 beats @ 154. 40,027 isolated PACs (13.5% burden).   Premature atrial contractions    Psoriasis 10/10/2015    Past Surgical History:  Procedure Laterality Date   ABDOMINAL HYSTERECTOMY      BACK SURGERY     CERVICAL SPINE SURGERY     CHOLECYSTECTOMY     KNEE CLOSED REDUCTION Left 10/14/2020   Procedure: CLOSED MANIPULATION KNEE UNDER ANESTHESIA;  Surgeon: Lyndle Herrlich, MD;  Location: [facility] ORS;  Service: Orthopedics;  Laterality: Left;   MASTECTOMY Bilateral    SPINAL CORD STIMULATOR BATTERY EXCHANGE     Osteostimulator in back   TOTAL KNEE ARTHROPLASTY Left 06/17/2020   Procedure: TOTAL KNEE ARTHROPLASTY;  Surgeon: Lyndle Herrlich, MD;  Location: [facility] ORS;  Service: Orthopedics;  Laterality: Left;     Current Outpatient Medications  Medication Sig Dispense Refill   acetaminophen (TYLENOL) 500 MG tablet Take 1,000-2,500 mg by mouth 2 (two) times daily as needed for moderate pain.     albuterol (VENTOLIN HFA) 108 (90 Base) MCG/ACT inhaler Inhale 2 puffs into the lungs every 6 (six) hours as needed for wheezing or shortness of breath. 1 each 2   Apremilast (OTEZLA) 30 MG TABS Take by mouth.     aspirin 81 MG chewable tablet Chew 1 tablet (81 mg total) by mouth 2 (two) times daily. 60 tablet 0   carvedilol (COREG) 6.25 MG tablet Take 1 tablet (6.25 mg total) by mouth 2 (two) times daily. 180 tablet 3   enalapril (VASOTEC) 20 MG tablet Take 1 tablet by mouth twice daily 180 tablet 0   glimepiride (AMARYL) 4 MG tablet Take 1  tablet (4 mg total) by mouth daily with breakfast. 90 tablet 1   hydrochlorothiazide (HYDRODIURIL) 25 MG tablet Take 1 tablet (25 mg total) by mouth daily. 90 tablet 3   ipratropium-albuterol (DUONEB) 0.5-2.5 (3) MG/3ML SOLN Take 3 mLs by nebulization 3 (three) times daily as needed (for SOB/cough/wheeze COPD exacerbation). 180 mL 0   OneTouch Delica Lancets 33G MISC 1 Bottle by Does not apply route in the morning, at noon, and at bedtime. 100 each 11   ONETOUCH ULTRA test strip USE 1  TO CHECK GLUCOSE IN THE MORNING, AT NOON, AND AT BEDTIME 100 each 0   pantoprazole (PROTONIX) 20 MG tablet Take 1 tablet (20 mg total) by mouth daily. 90 tablet 3    pravastatin (PRAVACHOL) 80 MG tablet Take 1 tablet (80 mg total) by mouth daily. 90 tablet 3   No current facility-administered medications for this visit.    Allergies:   Amlodipine and Metformin and related    Social History:  The patient  reports that she has quit smoking. Her smoking use included cigarettes. She has a 60.00 pack-year smoking history. She has never used smokeless tobacco. She reports that she does not drink alcohol and does not use drugs.   Family History:  The patient's family history includes COPD in her father; Cancer in her brother; Diabetes in her brother and sister; Leukemia in her mother; Pneumonia in her father.    ROS:  Please see the history of present illness.   Otherwise, review of systems are positive for none.   All other systems are reviewed and negative.    PHYSICAL EXAM: VS:  BP 130/62 (BP Location: Right Arm, Patient Position: Sitting, Cuff Size: Normal)    Pulse 72    Ht 5\' 5"  (1.651 m)    Wt 206 lb 4 oz (93.6 kg)    SpO2 96%    BMI 34.32 kg/m  , BMI Body mass index is 34.32 kg/m. GEN: Well nourished, well developed, in no acute distress  HEENT: normal  Neck: no JVD, carotid bruits, or masses Cardiac: RRR; no  rubs, or gallops,no edema . No murmurs Respiratory:  clear to auscultation bilaterally, normal work of breathing GI: soft, nontender, nondistended, + BS MS: no deformity or atrophy  Skin: warm and dry, no rash Neuro:  Strength and sensation are intact Psych: euthymic mood, full affect   EKG:  EKG is not for ordered today.    Recent Labs: 11/08/2020: Magnesium 1.9; TSH 1.63 03/17/2021: ALT 8; BUN 30; Creat 1.09; Hemoglobin 12.6; Platelets 374; Potassium 4.6; Sodium 138    Lipid Panel    Component Value Date/Time   CHOL 152 11/08/2020 1025   CHOL 205 (H) 03/13/2015 0932   TRIG 107 11/08/2020 1025   HDL 58 11/08/2020 1025   HDL 52 03/13/2015 0932   CHOLHDL 2.6 11/08/2020 1025   VLDL 20 07/29/2016 0842   LDLCALC 75 11/08/2020  1025      Wt Readings from Last 3 Encounters:  04/24/21 206 lb 4 oz (93.6 kg)  03/11/21 202 lb 6.4 oz (91.8 kg)  01/20/21 205 lb 6 oz (93.2 kg)       PAD Screen 08/11/2016  Previous PAD dx? No  Previous surgical procedure? No  Pain with walking? No  Feet/toe relief with dangling? Yes  Painful, non-healing ulcers? No  Extremities discolored? No      ASSESSMENT AND PLAN:  1.  Palpitations: PACs and short runs of SVT were noted.  Symptoms are well  controlled with carvedilol.  2. Essential hypertension: Blood pressure is well controlled after recent switching of amlodipine to hydrochlorothiazide.  Lower extremity edema resolved.  3.  Hyperlipidemia: Continue treatment with pravastatin with a target LDL of less than 70 given that she is diabetic.  4.  Type 2 diabetes: This is managed by her primary care physician with most recent hemoglobin A1c of 7.  5.  COPD: This is stable and she uses oxygen only at night.   Disposition:   FU in 1 year.  Signed,  Lorine BearsMuhammad Yamin Swingler, MD  04/24/2021 9:17 AM    [facility]

## 2021-04-24 NOTE — Patient Instructions (Signed)

## 2021-05-02 ENCOUNTER — Telehealth: Payer: Self-pay

## 2021-05-02 NOTE — Progress Notes (Cosign Needed)
° ° °  Chronic Care Management Pharmacy Assistant   Opened in Error please VOID

## 2021-05-07 ENCOUNTER — Ambulatory Visit (INDEPENDENT_AMBULATORY_CARE_PROVIDER_SITE_OTHER): Payer: Medicare Other

## 2021-05-07 DIAGNOSIS — I10 Essential (primary) hypertension: Secondary | ICD-10-CM

## 2021-05-07 DIAGNOSIS — J439 Emphysema, unspecified: Secondary | ICD-10-CM

## 2021-05-07 DIAGNOSIS — L409 Psoriasis, unspecified: Secondary | ICD-10-CM

## 2021-05-07 DIAGNOSIS — E119 Type 2 diabetes mellitus without complications: Secondary | ICD-10-CM

## 2021-05-07 DIAGNOSIS — E782 Mixed hyperlipidemia: Secondary | ICD-10-CM

## 2021-05-07 DIAGNOSIS — K219 Gastro-esophageal reflux disease without esophagitis: Secondary | ICD-10-CM

## 2021-05-07 NOTE — Progress Notes (Signed)
° °Chronic Care Management °Pharmacy Note ° °05/20/2021 °Name:  Julie Keith MRN:  [id number] DOB:  02/04/1946 ° °Summary: °Patient presents for initial CCM consult ° °Recommendations/Changes made from today's visit: °Continue current medications ° °Plan: °CPP follow-up 6 months ° ° °Subjective: °Julie Keith is an 75 y.o. year old female who is a primary patient of Tapia, Leisa, PA-C.  The CCM team was consulted for assistance with disease management and care coordination needs.   ° °Engaged with patient by telephone for initial visit in response to provider referral for pharmacy case management and/or care coordination services.  ° °Consent to Services:  °The patient was given the following information about Chronic Care Management services today, agreed to services, and gave verbal consent: 1. CCM service includes personalized support from designated clinical staff supervised by the primary care provider, including individualized plan of care and coordination with other care providers 2. 24/7 contact phone numbers for assistance for urgent and routine care needs. 3. Service will only be billed when office clinical staff spend 20 minutes or more in a month to coordinate care. 4. Only one practitioner may furnish and bill the service in a calendar month. 5.The patient may stop CCM services at any time (effective at the end of the month) by phone call to the office staff. 6. The patient will be responsible for cost sharing (co-pay) of up to 20% of the service fee (after annual deductible is met). Patient agreed to services and consent obtained. ° °Patient Care Team: °Tapia, Leisa, PA-C as PCP - General (Family Medicine) °Arida, Muhammad A, MD as PCP - Cardiology (Cardiology) °Benitez-Graham, Ana, MD (Dermatology) °Ramachandran, Pradeep, MD as Consulting Physician (Pulmonary Disease) °Arida, Muhammad A, MD as Consulting Physician (Cardiology) °Fleury, Alexandre A, RPH (Pharmacist) ° °Recent office  visits: °03/11/21: Patient presented to Leisa Tapia, PA-C ° °Recent consult visits: °04/24/21: Patient presented to Dr. Arida (Cardiology)  ° °Hospital visits: °None in previous 6 months ° ° °Objective: ° °Lab Results  °Component Value Date  ° CREATININE 1.09 (H) 03/17/2021  ° BUN 30 (H) 03/17/2021  ° GFRNONAA 59 (L) 06/18/2020  ° GFRAA 55 (L) 04/15/2020  ° NA 138 03/17/2021  ° K 4.6 03/17/2021  ° CALCIUM 9.3 03/17/2021  ° CO2 32 03/17/2021  ° GLUCOSE 188 (H) 03/17/2021  ° ° °Lab Results  °Component Value Date/Time  ° HGBA1C 7.0 (H) 03/17/2021 09:40 AM  ° HGBA1C 7.2 (H) 11/08/2020 10:25 AM  ° HGBA1C 7.5 01/10/2016 12:00 AM  ° MICROALBUR 2.1 04/15/2020 11:16 AM  ° MICROALBUR 2.4 12/27/2018 12:00 AM  ° MICROALBUR 100 03/05/2015 07:47 AM  °  °Last diabetic Eye exam:  °Lab Results  °Component Value Date/Time  ° HMDIABEYEEXA No Retinopathy 01/14/2021 12:00 AM  °  °Last diabetic Foot exam: No results found for: HMDIABFOOTEX  ° °Lab Results  °Component Value Date  ° CHOL 152 11/08/2020  ° HDL 58 11/08/2020  ° LDLCALC 75 11/08/2020  ° TRIG 107 11/08/2020  ° CHOLHDL 2.6 11/08/2020  ° ° °Hepatic Function Latest Ref Rng & Units 03/17/2021 11/08/2020 04/15/2020  °Total Protein 6.1 - 8.1 g/dL 7.4 7.4 7.3  °Albumin 3.6 - 5.1 g/dL - - -  °AST 10 - 35 U/L 12 11 14  °ALT 6 - 29 U/L 8 4(L) 12  °Alk Phosphatase 33 - 130 U/L - - -  °Total Bilirubin 0.2 - 1.2 mg/dL 0.5 0.5 0.4  ° ° °Lab Results  °Component Value Date/Time  ° TSH 1.63 11/08/2020   10:25 AM  ° TSH 1.45 07/29/2016 08:42 AM  ° ° °CBC Latest Ref Rng & Units 03/17/2021 11/08/2020 06/18/2020  °WBC 3.8 - 10.8 Thousand/uL 9.0 11.0(H) 17.2(H)  °Hemoglobin 11.7 - 15.5 g/dL 12.6 12.8 11.3(L)  °Hematocrit 35.0 - 45.0 % 39.6 40.8 35.6(L)  °Platelets 140 - 400 Thousand/uL 374 433(H) 283  ° ° °No results found for: VD25OH ° °Clinical ASCVD: No  °The 10-year ASCVD risk score (Arnett DK, et al., 2019) is: 44.8% °  Values used to calculate the score: °    Age: 75 years °    Sex: Female °    Is  Non-Hispanic African American: No °    Diabetic: Yes °    Tobacco smoker: No °    Systolic Blood Pressure: 130 mmHg °    Is BP treated: Yes °    HDL Cholesterol: 58 mg/dL °    Total Cholesterol: 152 mg/dL   ° °Depression screen PHQ 2/9 03/11/2021 12/26/2020 12/04/2020  °Decreased Interest 0 0 0  °Down, Depressed, Hopeless 0 0 0  °PHQ - 2 Score 0 0 0  °Altered sleeping 0 0 -  °Tired, decreased energy 0 0 -  °Change in appetite 0 0 -  °Feeling bad or failure about yourself  0 0 -  °Trouble concentrating 0 0 -  °Moving slowly or fidgety/restless 0 0 -  °Suicidal thoughts 0 0 -  °PHQ-9 Score 0 0 -  °Difficult doing work/chores Not difficult at all Not difficult at all -  °Some recent data might be hidden  °  °Social History  ° °Tobacco Use  °Smoking Status Former  ° Packs/day: 2.00  ° Years: 30.00  ° Pack years: 60.00  ° Types: Cigarettes  °Smokeless Tobacco Never  °Tobacco Comments  ° quit 2006  ° °BP Readings from Last 3 Encounters:  °04/24/21 130/62  °03/11/21 122/60  °01/20/21 138/64  ° °Pulse Readings from Last 3 Encounters:  °04/24/21 72  °03/11/21 70  °01/20/21 78  ° °Wt Readings from Last 3 Encounters:  °04/24/21 206 lb 4 oz (93.6 kg)  °03/11/21 202 lb 6.4 oz (91.8 kg)  °01/20/21 205 lb 6 oz (93.2 kg)  ° °BMI Readings from Last 3 Encounters:  °04/24/21 34.32 kg/m²  °03/11/21 38.24 kg/m²  °01/20/21 39.45 kg/m²  ° ° °Assessment/Interventions: Review of patient past medical history, allergies, medications, health status, including review of consultants reports, laboratory and other test data, was performed as part of comprehensive evaluation and provision of chronic care management services.  ° °SDOH:  (Social Determinants of Health) assessments and interventions performed: Yes °SDOH Interventions   ° °Flowsheet Row Most Recent Value  °SDOH Interventions   °Financial Strain Interventions Intervention Not Indicated  ° °  ° °SDOH Screenings  ° °Alcohol Screen: Low Risk   ° Last Alcohol Screening Score (AUDIT): 0   °Depression (PHQ2-9): Low Risk   ° PHQ-2 Score: 0  °Financial Resource Strain: Low Risk   ° Difficulty of Paying Living Expenses: Not hard at all  °Food Insecurity: Not on file  °Housing: Not on file  °Physical Activity: Not on file  °Social Connections: Not on file  °Stress: Not on file  °Tobacco Use: Medium Risk  ° Smoking Tobacco Use: Former  ° Smokeless Tobacco Use: Never  ° Passive Exposure: Not on file  °Transportation Needs: Not on file  ° ° °CCM Care Plan ° °Allergies  °Allergen Reactions  ° Amlodipine   °  Lower extremity swelling  ° Metformin   And Related   °  headaches  ° ° °Medications Reviewed Today   ° ° Reviewed by Fleury, Alexandre A, RPH (Pharmacist) on 05/20/21 at 1054  Med List Status: <None>  ° °Medication Order Taking? Sig Documenting Provider Last Dose Status Informant  °acetaminophen (TYLENOL) 500 MG tablet [id number] Yes Take 1,000 mg by mouth every 8 (eight) hours as needed for moderate pain. [provider] Taking Active Self  °albuterol (VENTOLIN HFA) 108 (90 Base) MCG/ACT inhaler [id number] No Inhale 2 puffs into the lungs every 6 (six) hours as needed for wheezing or shortness of breath.  °Patient not taking: Reported on 05/07/2021  ° Tapia, Leisa, PA-C Not Taking Active   °Apremilast (OTEZLA) 30 MG TABS [id number] Yes Take 30 mg by mouth in the morning and at bedtime. [provider] Taking Active   °aspirin 81 MG chewable tablet [id number] Yes Chew 1 tablet (81 mg total) by mouth 2 (two) times daily. Jones, Maurice, PA-C Taking Active Self  °carvedilol (COREG) 6.25 MG tablet [id number] Yes Take 1 tablet (6.25 mg total) by mouth 2 (two) times daily. Berge, Christopher Ronald, NP Taking Active   °enalapril (VASOTEC) 20 MG tablet [id number] Yes Take 1 tablet by mouth twice daily Tapia, Leisa, PA-C Taking Active   °glimepiride (AMARYL) 4 MG tablet [id number]  Take 1 tablet by mouth once daily with breakfast Tapia, Leisa, PA-C  Active   °hydrochlorothiazide (HYDRODIURIL) 25 MG  tablet [id number] Yes Take 1 tablet (25 mg total) by mouth daily. Berge, Christopher Ronald, NP Taking Expired 05/07/21 2359   °ipratropium-albuterol (DUONEB) 0.5-2.5 (3) MG/3ML SOLN [id number] No Take 3 mLs by nebulization 3 (three) times daily as needed (for SOB/cough/wheeze COPD exacerbation).  °Patient not taking: Reported on 05/07/2021  ° Tapia, Leisa, PA-C Not Taking Active Self  °OneTouch Delica Lancets 33G MISC [id number]  1 Bottle by Does not apply route in the morning, at noon, and at bedtime. Tapia, Leisa, PA-C  Active   °ONETOUCH ULTRA test strip [id number]  USE 1  TO CHECK GLUCOSE IN THE MORNING, AT NOON, AND AT BEDTIME Tapia, Leisa, PA-C  Active   °pantoprazole (PROTONIX) 20 MG tablet [id number] Yes Take 1 tablet (20 mg total) by mouth daily. Tapia, Leisa, PA-C Taking Active   °pravastatin (PRAVACHOL) 80 MG tablet [id number] Yes Take 1 tablet (80 mg total) by mouth daily. 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  ° History of total knee replacement, left 06/17/2020  ° Osteoarthritis of knee 03/09/2020  ° Osteoarthritis of left knee 02/28/2020  ° Stage 3 chronic kidney disease (HCC) 09/21/2019  ° Psoriasis 10/10/2015  ° GERD (gastroesophageal reflux disease) 10/10/2015  ° Type 2 diabetes mellitus without complication, without long-term current use of insulin (HCC) 10/31/2014  ° Essential hypertension 10/31/2014  ° Hyperlipidemia 10/31/2014  ° Morbid obesity (HCC) 10/31/2014  ° Pulmonary emphysema (HCC) 06/28/2014  ° ° °Immunization History  °Administered Date(s) Administered  ° Fluad Quad(high Dose 65+) 12/27/2018, 01/23/2020, 12/26/2020  ° Influenza, High Dose Seasonal PF 02/14/2015, 01/27/2016, 01/18/2017, 01/26/2018  ° Influenza-Unspecified 01/31/2014  ° PFIZER(Purple Top)SARS-COV-2 Vaccination 03/21/2020, 04/10/2020  ° Pneumococcal Conjugate-13 02/19/2014  ° Pneumococcal  Polysaccharide-23 04/01/2010  ° Zoster, Live 10/14/2010  ° ° °Conditions to be addressed/monitored:  °Hypertension, Hyperlipidemia, Diabetes, GERD, Chronic Kidney Disease, and Osteoarthritis ° °Care Plan : General Pharmacy (Adult)  °Updates made   by Fleury, Alexandre A, RPH since 05/20/2021 12:00 AM  °  ° °Problem: Hypertension, Hyperlipidemia, Diabetes, GERD, Chronic Kidney Disease, and Osteoarthritis   °Priority: High  °  ° °Long-Range Goal: Patient-Specific Goal   °Start Date: 05/20/2021  °Expected End Date: 05/20/2022  °This Visit's Progress: On track  °Priority: High  °Note:   °Current Barriers:  °No barriers noted ° °Pharmacist Clinical Goal(s):  °Patient will maintain control of blood pressure as evidenced by BP less than 140/90  through collaboration with PharmD and provider.  ° °Interventions: °1:1 collaboration with Tapia, Leisa, PA-C regarding development and update of comprehensive plan of care as evidenced by provider attestation and co-signature °Inter-disciplinary care team collaboration (see longitudinal plan of care) °Comprehensive medication review performed; medication list updated in electronic medical record ° °Hypertension (BP goal <140/90) °-Controlled °-Current treatment: °Carvedilol 6.25 mg twice daily: Appropriate, Effective, Safe, Accessible °Enalapril 20 mg twice daily: Appropriate, Effective, Safe, Accessible  °HCTZ 25 mg daily: Appropriate, Effective, Safe, Accessible  °-Medications previously tried: NA  °-History of tachycardia  °-Current home readings: 120s/70s  °-Denies hypotensive/hypertensive symptoms °-Recommended to continue current medication ° °Hyperlipidemia: (LDL goal < 70) °-Uncontrolled °-Current treatment: °Pravastatin 80 mg daily: Appropriate, Effective, Safe, Accessible  °-Current treatment: °Aspirin 81 mg twice daily: Appropriate, Effective, Safe, Accessible  °-Medications previously tried: NA  °-Recommended to continue current medication ° °Diabetes (A1c goal  <7%) °-Controlled °-Current medications: °Glimepiride 4 mg daily: Appropriate, Effective, Safe, Accessible  °-Medications previously tried: Actos, Metformin (headaches)   °-Current home glucose readings °fasting glucose: 116,  °-Denies hypoglycemic/hyperglycemic symptoms °-Recommended to continue current medication ° °Psoriatic Arthritis  (Goal: Minimize pain symptoms) °-Controlled °-managed by Dr. Graham °-Current treatment  °Acetaminophen 500 mg 2-5 tablets twice daily as needed: Appropriate, Effective, Safe, Accessible °Apremilast 30 mg twice daily: Appropriate, Effective, Safe, Accessible °-Medications previously tried:   °-Hasn't needed tylenol recently. °-Recommended to continue current medication ° °GERD (Goal: Prevent heartburn) °-Controlled °-Current treatment  °Pantoprazole 20 mg daily: Appropriate, Effective, Safe, Accessible  °-Medications previously tried: NA  °-Hasn't had symptoms, could consider taper.  °-Recommended to continue current medication ° °COPD (Goal: control symptoms and prevent exacerbations) °-Not ideally controlled °-Current treatment  °Albuterol HFA: Appropriate, Effective, Safe, Accessible  °-Medications previously tried: NA  °-Former smoker O2, at night  °-Frequency of rescue inhaler use: Sporadic °-Recommended to continue current medication ° °Patient Goals/Self-Care Activities °Patient will:  °- check glucose daily before breakfast, document, and provide at future appointments ° °Follow Up Plan: Telephone follow up appointment with care management team member scheduled for:  10/29/2021 at 8:30 AM °  °  ° °Medication Assistance: None required.  Patient affirms current coverage meets needs. ° °Compliance/Adherence/Medication fill history: °Care Gaps: °Tdap °Covid booster ° °Star-Rating Drugs: °Enalapril 20 mg: Last filled 04/06/21 for 90-DS °Glimepiride 4 mg: Last filled 02/13/21 for 90-DS °Pravastatin 80 mg: Last filled 02/06/21 for 90-DS ° °Patient's preferred pharmacy  is: ° °[id number] - [place] - [address] [place] [address] °[address] [place] [address] °[place] [address] °Phone: [phone] Fax: [phone] ° °Uses pill box? Yes °Pt endorses 100% compliance ° °We discussed: Current pharmacy is preferred with insurance plan and patient is satisfied with pharmacy services °Patient decided to: Continue current medication management strategy ° °Care Plan and Follow Up Patient Decision:  Patient agrees to Care Plan and Follow-up. ° °Plan: Telephone follow up appointment with care management team member scheduled for:  10/29/2021 at 8:30 AM ° °Alex Fleury,PharmD, BCACP, CPP °Clinical Pharmacist   Practitioner  °[facility] °[phone] °

## 2021-05-12 ENCOUNTER — Other Ambulatory Visit: Payer: Self-pay | Admitting: Family Medicine

## 2021-05-12 DIAGNOSIS — E119 Type 2 diabetes mellitus without complications: Secondary | ICD-10-CM

## 2021-05-20 DIAGNOSIS — Z87891 Personal history of nicotine dependence: Secondary | ICD-10-CM

## 2021-05-20 DIAGNOSIS — I10 Essential (primary) hypertension: Secondary | ICD-10-CM

## 2021-05-20 DIAGNOSIS — E119 Type 2 diabetes mellitus without complications: Secondary | ICD-10-CM

## 2021-05-20 DIAGNOSIS — E782 Mixed hyperlipidemia: Secondary | ICD-10-CM

## 2021-05-20 DIAGNOSIS — J439 Emphysema, unspecified: Secondary | ICD-10-CM

## 2021-05-20 NOTE — Patient Instructions (Signed)
Visit Information It was great speaking with you today!  Please let me know if you have any questions about our visit.   Goals Addressed             This Visit's Progress    Monitor and Manage My Blood Sugar-Diabetes Type 2       Timeframe:  Long-Range Goal Priority:  High Start Date: 05/20/2021                             Expected End Date: 05/20/2022                      Follow Up within 90 days   - check blood sugar once daily before breakfast - check blood sugar if I feel it is too high or too low - take the blood sugar log to all doctor visits    Why is this important?   Checking your blood sugar at home helps to keep it from getting very high or very low.  Writing the results in a diary or log helps the doctor know how to care for you.  Your blood sugar log should have the time, date and the results.  Also, write down the amount of insulin or other medicine that you take.  Other information, like what you ate, exercise done and how you were feeling, will also be helpful.     Notes:         Patient Care Plan: General Pharmacy (Adult)     Problem Identified: Hypertension, Hyperlipidemia, Diabetes, GERD, Chronic Kidney Disease, and Osteoarthritis   Priority: High     Long-Range Goal: Patient-Specific Goal   Start Date: 05/20/2021  Expected End Date: 05/20/2022  This Visit's Progress: On track  Priority: High  Note:   Current Barriers:  No barriers noted  Pharmacist Clinical Goal(s):  Patient will maintain control of blood pressure as evidenced by BP less than 140/90  through collaboration with PharmD and provider.   Interventions: 1:1 collaboration with Danelle Berry, PA-C regarding development and update of comprehensive plan of care as evidenced by provider attestation and co-signature Inter-disciplinary care team collaboration (see longitudinal plan of care) Comprehensive medication review performed; medication list updated in electronic medical  record  Hypertension (BP goal <140/90) -Controlled -Current treatment: Carvedilol 6.25 mg twice daily: Appropriate, Effective, Safe, Accessible Enalapril 20 mg twice daily: Appropriate, Effective, Safe, Accessible  HCTZ 25 mg daily: Appropriate, Effective, Safe, Accessible  -Medications previously tried: NA  -History of tachycardia  -Current home readings: 120s/70s  -Denies hypotensive/hypertensive symptoms -Recommended to continue current medication  Hyperlipidemia: (LDL goal < 70) -Uncontrolled -Current treatment: Pravastatin 80 mg daily: Appropriate, Effective, Safe, Accessible  -Current treatment: Aspirin 81 mg twice daily: Appropriate, Effective, Safe, Accessible  -Medications previously tried: NA  -Recommended to continue current medication  Diabetes (A1c goal <7%) -Controlled -Current medications: Glimepiride 4 mg daily: Appropriate, Effective, Safe, Accessible  -Medications previously tried: Actos, Metformin (headaches)   -Current home glucose readings fasting glucose: 116,  -Denies hypoglycemic/hyperglycemic symptoms -Recommended to continue current medication  Psoriatic Arthritis  (Goal: Minimize pain symptoms) -Controlled -managed by Dr. Cheree Ditto -Current treatment  Acetaminophen 500 mg 2-5 tablets twice daily as needed: Appropriate, Effective, Safe, Accessible Apremilast 30 mg twice daily: Appropriate, Effective, Safe, Accessible -Medications previously tried:   -Hasn't needed tylenol recently. -Recommended to continue current medication  GERD (Goal: Prevent heartburn) -Controlled -Current treatment  Pantoprazole 20 mg daily:  Appropriate, Effective, Safe, Accessible  -Medications previously tried: NA  -Hasn't had symptoms, could consider taper.  -Recommended to continue current medication  COPD (Goal: control symptoms and prevent exacerbations) -Not ideally controlled -Current treatment  Albuterol HFA: Appropriate, Effective, Safe, Accessible   -Medications previously tried: NA  -Former smoker O2, at night  -Frequency of rescue inhaler use: Sporadic -Recommended to continue current medication  Patient Goals/Self-Care Activities Patient will:  - check glucose daily before breakfast, document, and provide at future appointments  Follow Up Plan: Telephone follow up appointment with care management team member scheduled for:  10/29/2021 at 8:30 AM    Julie Keith was given information about Chronic Care Management services today including:  CCM service includes personalized support from designated clinical staff supervised by her physician, including individualized plan of care and coordination with other care providers 24/7 contact phone numbers for assistance for urgent and routine care needs. Standard insurance, coinsurance, copays and deductibles apply for chronic care management only during months in which we provide at least 20 minutes of these services. Most insurances cover these services at 100%, however patients may be responsible for any copay, coinsurance and/or deductible if applicable. This service may help you avoid the need for more expensive face-to-face services. Only one practitioner may furnish and bill the service in a calendar month. The patient may stop CCM services at any time (effective at the end of the month) by phone call to the office staff.  Patient agreed to services and verbal consent obtained.   Patient verbalizes understanding of instructions and care plan provided today and agrees to view in MyChart. Active MyChart status confirmed with patient.    Cheyenne Adas, CPP Clinical Pharmacist Practitioner  [facility] [phone]

## 2021-05-30 ENCOUNTER — Ambulatory Visit (INDEPENDENT_AMBULATORY_CARE_PROVIDER_SITE_OTHER): Payer: Medicare Other | Admitting: Nurse Practitioner

## 2021-05-30 ENCOUNTER — Encounter: Payer: Self-pay | Admitting: Nurse Practitioner

## 2021-05-30 ENCOUNTER — Other Ambulatory Visit: Payer: Self-pay

## 2021-05-30 VITALS — BP 130/80 | HR 87 | Temp 98.1°F | Resp 16 | Ht 61.0 in | Wt 206.4 lb

## 2021-05-30 DIAGNOSIS — J069 Acute upper respiratory infection, unspecified: Secondary | ICD-10-CM | POA: Diagnosis not present

## 2021-05-30 DIAGNOSIS — J441 Chronic obstructive pulmonary disease with (acute) exacerbation: Secondary | ICD-10-CM

## 2021-05-30 MED ORDER — CETIRIZINE HCL 10 MG PO TABS: 10.0000 mg | ORAL_TABLET | Freq: Every day | ORAL | 11 refills | Status: DC

## 2021-05-30 MED ORDER — BENZONATATE 100 MG PO CAPS
200.0000 mg | ORAL_CAPSULE | Freq: Two times a day (BID) | ORAL | 0 refills | Status: DC | PRN
Start: 2021-05-30 — End: 2021-08-22

## 2021-05-30 MED ORDER — PREDNISONE 10 MG (21) PO TBPK: ORAL_TABLET | ORAL | 0 refills | Status: DC

## 2021-05-30 MED ORDER — AZITHROMYCIN 250 MG PO TABS: ORAL_TABLET | ORAL | 0 refills | Status: AC

## 2021-05-30 MED ORDER — FLUTICASONE PROPIONATE 50 MCG/ACT NA SUSP: 2.0000 | Freq: Every day | NASAL | 6 refills | Status: DC

## 2021-05-30 NOTE — Progress Notes (Signed)
BP 130/80    Pulse 87    Temp 98.1 F (36.7 C) (Oral)    Resp 16    Ht $R'5\' 1"'ul$  (1.549 m)    Wt 206 lb 6.4 oz (93.6 kg)    SpO2 94%    BMI 39.00 kg/m    Subjective:    Patient ID: Julie Keith, female    DOB: May 27, 1945, 76 y.o.   MRN: [id number]  HPI: Julie Keith is a 76 y.o. female, here alone  Chief Complaint  Patient presents with   Cough    Congested, allergies for 2 weeks   URI/ COPD exacerbation: She says that it started two weeks ago with itchy water eyes, scratchy throat and nasal drainage. She says now she has a cough.  She denies fever or shortness of breath. However she presents short of breath.  Her lung sounds are wheezing all over.  She says she has not been using her albuterol.  She says she has been wearing her oxygen at night.  Discussed testing for flu and covid she declined since out of window for antiviral treatment.  She says she has been taking mucinex to help with symptoms. Discussed OTC treatments for symptoms including flonase, zyrtec and continuing mucinex.  Will do course of steroids, tessalon perls and gave her sample of stiolto respimat to take. Discussed watching for worsening signs that would warrant emergency care. Push fluids and get rest.   Relevant past medical, surgical, family and social history reviewed and updated as indicated. Interim medical history since our last visit reviewed. Allergies and medications reviewed and updated.  Review of Systems  Constitutional: Negative for fever or weight change.  HEENT: positive for water eyes, running nose Respiratory: Positive for cough and negative for shortness of breath.   Cardiovascular: Negative for chest pain or palpitations.  Gastrointestinal: Negative for abdominal pain, no bowel changes.  Musculoskeletal: Negative for gait problem or joint swelling.  Skin: Negative for rash.  Neurological: Negative for dizziness or headache.  No other specific complaints in a complete review of systems  (except as listed in HPI above).      Objective:    BP 130/80    Pulse 87    Temp 98.1 F (36.7 C) (Oral)    Resp 16    Ht $R'5\' 1"'As$  (1.549 m)    Wt 206 lb 6.4 oz (93.6 kg)    SpO2 94%    BMI 39.00 kg/m   Wt Readings from Last 3 Encounters:  05/30/21 206 lb 6.4 oz (93.6 kg)  04/24/21 206 lb 4 oz (93.6 kg)  03/11/21 202 lb 6.4 oz (91.8 kg)    Physical Exam  Constitutional: Patient appears well-developed and well-nourished. Obese  No distress.  HEENT: head atraumatic, normocephalic, pupils equal and reactive to light, ears TMs clear, neck supple, throat within normal limits Cardiovascular: Normal rate, regular rhythm and normal heart sounds.  No murmur heard. No BLE edema. Pulmonary/Chest: Effort tachypnea and breath sounds wheezing. No respiratory distress. Abdominal: Soft.  There is no tenderness. Psychiatric: Patient has a normal mood and affect. behavior is normal. Judgment and thought content normal.  Results for orders placed or performed in visit on 03/11/21  COMPLETE METABOLIC PANEL WITH GFR  Result Value Ref Range   Glucose, Bld 188 (H) 65 - 99 mg/dL   BUN 30 (H) 7 - 25 mg/dL   Creat 1.09 (H) 0.60 - 1.00 mg/dL   eGFR 53 (L) > OR = 60 mL/min/1.20m2  BUN/Creatinine Ratio 28 (H) 6 - 22 (calc)   Sodium 138 135 - 146 mmol/L   Potassium 4.6 3.5 - 5.3 mmol/L   Chloride 99 98 - 110 mmol/L   CO2 32 20 - 32 mmol/L   Calcium 9.3 8.6 - 10.4 mg/dL   Total Protein 7.4 6.1 - 8.1 g/dL   Albumin 3.9 3.6 - 5.1 g/dL   Globulin 3.5 1.9 - 3.7 g/dL (calc)   AG Ratio 1.1 1.0 - 2.5 (calc)   Total Bilirubin 0.5 0.2 - 1.2 mg/dL   Alkaline phosphatase (APISO) 82 37 - 153 U/L   AST 12 10 - 35 U/L   ALT 8 6 - 29 U/L  Hemoglobin A1C  Result Value Ref Range   Hgb A1c MFr Bld 7.0 (H) <5.7 % of total Hgb   Mean Plasma Glucose 154 mg/dL   eAG (mmol/L) 8.5 mmol/L  CBC with Differential/Platelet  Result Value Ref Range   WBC 9.0 3.8 - 10.8 Thousand/uL   RBC 4.61 3.80 - 5.10 Million/uL   Hemoglobin  12.6 11.7 - 15.5 g/dL   HCT 39.6 35.0 - 45.0 %   MCV 85.9 80.0 - 100.0 fL   MCH 27.3 27.0 - 33.0 pg   MCHC 31.8 (L) 32.0 - 36.0 g/dL   RDW 13.4 11.0 - 15.0 %   Platelets 374 140 - 400 Thousand/uL   MPV 9.9 7.5 - 12.5 fL   Neutro Abs 5,760 1,500 - 7,800 cells/uL   Lymphs Abs 2,412 850 - 3,900 cells/uL   Absolute Monocytes 612 200 - 950 cells/uL   Eosinophils Absolute 162 15 - 500 cells/uL   Basophils Absolute 54 0 - 200 cells/uL   Neutrophils Relative % 64 %   Total Lymphocyte 26.8 %   Monocytes Relative 6.8 %   Eosinophils Relative 1.8 %   Basophils Relative 0.6 %      Assessment & Plan:   1. Viral upper respiratory tract infection -push fluids -continue OTC treatments for symptoms - benzonatate (TESSALON) 100 MG capsule; Take 2 capsules (200 mg total) by mouth 2 (two) times daily as needed for cough.  Dispense: 20 capsule; Refill: 0 - fluticasone (FLONASE) 50 MCG/ACT nasal spray; Place 2 sprays into both nostrils daily.  Dispense: 16 g; Refill: 6 - cetirizine (ZYRTEC) 10 MG tablet; Take 1 tablet (10 mg total) by mouth daily.  Dispense: 30 tablet; Refill: 11  2. COPD with acute exacerbation (HCC) -wear o2 when you need it -use stiolto respimat  2 puffs once a day - predniSONE (STERAPRED UNI-PAK 21 TAB) 10 MG (21) TBPK tablet; Take as directed on package.  (60 mg po on day 1, 50 mg po on day 2...)  Dispense: 21 tablet; Refill: 0 - azithromycin (ZITHROMAX) 250 MG tablet; Take 2 tablets on day 1, then 1 tablet daily on days 2 through 5  Dispense: 6 tablet; Refill: 0   Follow up plan: Return if symptoms worsen or fail to improve.

## 2021-05-31 ENCOUNTER — Encounter: Payer: Self-pay | Admitting: Nurse Practitioner

## 2021-07-12 ENCOUNTER — Other Ambulatory Visit: Payer: Self-pay | Admitting: Family Medicine

## 2021-07-12 DIAGNOSIS — E119 Type 2 diabetes mellitus without complications: Secondary | ICD-10-CM

## 2021-07-12 DIAGNOSIS — I10 Essential (primary) hypertension: Secondary | ICD-10-CM

## 2021-07-14 DIAGNOSIS — H35371 Puckering of macula, right eye: Secondary | ICD-10-CM | POA: Diagnosis not present

## 2021-07-16 ENCOUNTER — Other Ambulatory Visit: Payer: Self-pay | Admitting: Emergency Medicine

## 2021-07-16 MED ORDER — ONETOUCH ULTRA VI STRP: ORAL_STRIP | 1 refills | Status: AC

## 2021-07-28 DIAGNOSIS — H43821 Vitreomacular adhesion, right eye: Secondary | ICD-10-CM | POA: Diagnosis not present

## 2021-08-11 DIAGNOSIS — H524 Presbyopia: Secondary | ICD-10-CM | POA: Diagnosis not present

## 2021-08-22 ENCOUNTER — Ambulatory Visit (INDEPENDENT_AMBULATORY_CARE_PROVIDER_SITE_OTHER): Payer: Medicare Other | Admitting: Family Medicine

## 2021-08-22 ENCOUNTER — Encounter: Payer: Self-pay | Admitting: Family Medicine

## 2021-08-22 VITALS — BP 136/68 | HR 74 | Temp 98.4°F | Resp 18 | Ht 61.0 in | Wt 214.3 lb

## 2021-08-22 DIAGNOSIS — N1831 Chronic kidney disease, stage 3a: Secondary | ICD-10-CM

## 2021-08-22 DIAGNOSIS — Z6841 Body Mass Index (BMI) 40.0 and over, adult: Secondary | ICD-10-CM

## 2021-08-22 DIAGNOSIS — Z96652 Presence of left artificial knee joint: Secondary | ICD-10-CM

## 2021-08-22 DIAGNOSIS — E782 Mixed hyperlipidemia: Secondary | ICD-10-CM

## 2021-08-22 DIAGNOSIS — I10 Essential (primary) hypertension: Secondary | ICD-10-CM | POA: Diagnosis not present

## 2021-08-22 DIAGNOSIS — J439 Emphysema, unspecified: Secondary | ICD-10-CM

## 2021-08-22 DIAGNOSIS — K219 Gastro-esophageal reflux disease without esophagitis: Secondary | ICD-10-CM

## 2021-08-22 DIAGNOSIS — D75839 Thrombocytosis, unspecified: Secondary | ICD-10-CM

## 2021-08-22 DIAGNOSIS — D72829 Elevated white blood cell count, unspecified: Secondary | ICD-10-CM | POA: Insufficient documentation

## 2021-08-22 DIAGNOSIS — Z79899 Other long term (current) drug therapy: Secondary | ICD-10-CM

## 2021-08-22 DIAGNOSIS — E119 Type 2 diabetes mellitus without complications: Secondary | ICD-10-CM | POA: Diagnosis not present

## 2021-08-22 DIAGNOSIS — D84821 Immunodeficiency due to drugs: Secondary | ICD-10-CM

## 2021-08-22 DIAGNOSIS — J9610 Chronic respiratory failure, unspecified whether with hypoxia or hypercapnia: Secondary | ICD-10-CM

## 2021-08-22 MED ORDER — HYDROCHLOROTHIAZIDE 25 MG PO TABS: 25.0000 mg | ORAL_TABLET | Freq: Every day | ORAL | 3 refills | Status: DC

## 2021-08-22 MED ORDER — ENALAPRIL MALEATE 20 MG PO TABS: 20.0000 mg | ORAL_TABLET | Freq: Two times a day (BID) | ORAL | 2 refills | Status: DC

## 2021-08-22 NOTE — Assessment & Plan Note (Signed)
Allergic to metformin ?Diabetes is managed with sulfonylurea she checks her blood sugars every morning and takes her once a day dosing with food she denies any hypoglycemic episodes. ?She is on statin and ACE inhibitor ?

## 2021-08-22 NOTE — Assessment & Plan Note (Signed)
Monitoring

## 2021-08-22 NOTE — Assessment & Plan Note (Signed)
Patient recently had COPD flare was treated and she feels at her baseline, presenting today just walking into clinic her pulse ox was 88% and later after resting on room air improved to 93% she has home oxygen but has not been managed by pulmonary in some time.  Only uses oxygen at bedtime may benefit and require portable oxygen at this time, referred to pulmonary to reestablish care ?

## 2021-08-22 NOTE — Assessment & Plan Note (Signed)
Symptoms well controlled with long-term PPI use, risk and long-term side effects reviewed patient verbalizes understanding, can continue PPI ?

## 2021-08-22 NOTE — Assessment & Plan Note (Signed)
monitoring

## 2021-08-22 NOTE — Assessment & Plan Note (Signed)
Lipid panel done about 6 months ago, lipids at goal, monitoring, continue statin currently has good med compliance and no side effects or concerns ?

## 2021-08-22 NOTE — Assessment & Plan Note (Signed)
Multiple comorbidities - HLD, HTN, DM, COPD/emphysema,OA/chronic knee pain and limited mobility currently, very limited with physical activity ?

## 2021-08-22 NOTE — Assessment & Plan Note (Signed)
Chronic pain, at risk for falls, using a cane - she was encouraged to f/up with Derryl [facility] for continued pain ?

## 2021-08-22 NOTE — Assessment & Plan Note (Signed)
Previously managed by Dr. Nicholos Johns has home oxygen, needs follow-up with pulmonary may need portable oxygen referred for reevaluation ?

## 2021-08-22 NOTE — Progress Notes (Signed)
? ?Name: ELLYSIA FINLINSON   MRN: [id number]    DOB: [birth year]   Date:08/22/2021 ? ?     Progress Note ? ?Chief Complaint  ?Patient presents with  ? Diabetes  ? Hypertension  ? Hyperlipidemia  ? Gastroesophageal Reflux  ? ? ? ?Subjective:  ? ?CHARO ZARR is a 76 y.o. female, presents to clinic for routine f/up ? ?DM - A1C last was well controlled  ?108-116 no lows, she take glimepiride - denies hypoglycemia  ? ?Hypertension:  ?Currently managed on carvedilol, enalapril, hydrochlorothiazide ?Pt reports good med compliance and denies any SE.   ?Blood pressure today is well controlled. ?BP Readings from Last 3 Encounters:  ?08/22/21 136/68  ?05/30/21 130/80  ?04/24/21 130/62  ? ?Pt denies CP, LE edema, palpitation, Ha's, visual disturbances, lightheadedness, hypotension, syncope. ? ? ?Hyperlipidemia: ?Currently treated with pravastatin 80 mg, pt reports good med compliance ?Last Lipids: ?Lab Results  ?Component Value Date  ? CHOL 152 11/08/2020  ? HDL 58 11/08/2020  ? [place] 75 11/08/2020  ? TRIG 107 11/08/2020  ? CHOLHDL 2.6 11/08/2020  ? ?- Denies: Chest pain, myalgias, claudication ? ? ?Knee replacement last feb - Marica Otter - still having pain and trouble with it and she would like another opinion - can't walk well, using a cane, feels like shes going to fall, not as much swelling, but can't bear weight ? ?GERD - still on PPI long term due to sx - cannot get off meds ? ?Low SpO2 when she first got into room - 88%, then improved to 93% she reports previously getting oxygen and seeing a lung specialist but it has been several years history of emphysema and COPD, last time that she saw pulmonary she was consulted with Dr. Ashby Dawes he noted diagnosis of chronic hypoxic respiratory failure, uses oxygen only at night ? ?She is currently seeing cardiology for palpitations had some premature atrial contractions and with prior Holter monitor had some runs of tachycardia was put on metoprolol, now on carvedilol ? ? ?Current  Outpatient Medications:  ?  acetaminophen (TYLENOL) 500 MG tablet, Take 1,000 mg by mouth every 8 (eight) hours as needed for moderate pain., Disp: , Rfl:  ?  Apremilast (OTEZLA) 30 MG TABS, Take 30 mg by mouth in the morning and at bedtime., Disp: , Rfl:  ?  aspirin 81 MG chewable tablet, Chew 1 tablet (81 mg total) by mouth 2 (two) times daily., Disp: 60 tablet, Rfl: 0 ?  benzonatate (TESSALON) 100 MG capsule, Take 2 capsules (200 mg total) by mouth 2 (two) times daily as needed for cough., Disp: 20 capsule, Rfl: 0 ?  carvedilol (COREG) 6.25 MG tablet, Take 1 tablet (6.25 mg total) by mouth 2 (two) times daily., Disp: 180 tablet, Rfl: 3 ?  cetirizine (ZYRTEC) 10 MG tablet, Take 1 tablet (10 mg total) by mouth daily., Disp: 30 tablet, Rfl: 11 ?  enalapril (VASOTEC) 20 MG tablet, Take 1 tablet by mouth twice daily, Disp: 180 tablet, Rfl: 0 ?  fluticasone (FLONASE) 50 MCG/ACT nasal spray, Place 2 sprays into both nostrils daily., Disp: 16 g, Rfl: 6 ?  glimepiride (AMARYL) 4 MG tablet, Take 1 tablet by mouth once daily with breakfast, Disp: 90 tablet, Rfl: 0 ?  glucose blood (ONETOUCH ULTRA) test strip, USE 1 STRIP TO CHECK GLUCOSE IN THE MORNING, AT NOON, AND AT BEDTIME., Disp: 100 each, Rfl: 1 ?  pantoprazole (PROTONIX) 20 MG tablet, Take 1 tablet (20 mg total) by mouth daily., Disp:  90 tablet, Rfl: 3 ?  pravastatin (PRAVACHOL) 80 MG tablet, Take 1 tablet (80 mg total) by mouth daily., Disp: 90 tablet, Rfl: 3 ?  predniSONE (STERAPRED UNI-PAK 21 TAB) 10 MG (21) TBPK tablet, Take as directed on package.  (60 mg po on day 1, 50 mg po on day 2...), Disp: 21 tablet, Rfl: 0 ?  albuterol (VENTOLIN HFA) 108 (90 Base) MCG/ACT inhaler, Inhale 2 puffs into the lungs every 6 (six) hours as needed for wheezing or shortness of breath. (Patient not taking: Reported on 05/07/2021), Disp: 1 each, Rfl: 2 ?  hydrochlorothiazide (HYDRODIURIL) 25 MG tablet, Take 1 tablet (25 mg total) by mouth daily., Disp: 90 tablet, Rfl: 3 ? ?Patient  Active Problem List  ? Diagnosis Date Noted  ? 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 History of total knee replacement, left 06/17/2020  ? Osteoarthritis of knee 03/09/2020  ? Osteoarthritis of left knee 02/28/2020  ? Stage 3 chronic kidney disease ([place]) 09/21/2019  ? Psoriasis 10/10/2015  ? GERD (gastroesophageal reflux disease) 10/10/2015  ? Type 2 diabetes mellitus without complication, without long-term current use of insulin ([place]) 10/31/2014  ? Essential hypertension 10/31/2014  ? Hyperlipidemia 10/31/2014  ? Morbid obesity ([place]) 10/31/2014  ? Pulmonary emphysema ([facility]) 06/28/2014  ? ? ?Past Surgical History:  ?Procedure Laterality Date  ? ABDOMINAL HYSTERECTOMY    ? BACK SURGERY    ? CERVICAL SPINE SURGERY    ? CHOLECYSTECTOMY    ? KNEE CLOSED REDUCTION Left 10/14/2020  ? Procedure: CLOSED MANIPULATION KNEE UNDER ANESTHESIA;  Surgeon: Lovell Sheehan, MD;  Location: [facility] ORS;  Service: Orthopedics;  Laterality: Left;  ? MASTECTOMY Bilateral   ? SPINAL CORD STIMULATOR BATTERY EXCHANGE    ? Osteostimulator in back  ? TOTAL KNEE ARTHROPLASTY Left 06/17/2020  ? Procedure: TOTAL KNEE ARTHROPLASTY;  Surgeon: Lovell Sheehan, MD;  Location: [facility] ORS;  Service: Orthopedics;  Laterality: Left;  ? ? ?Family History  ?Problem Relation Age of Onset  ? Leukemia Mother   ? Pneumonia Father   ? COPD Father   ? Diabetes Brother   ? Cancer Brother   ?     lung cancer  ? Diabetes Sister   ? ? ?Social History  ? ?Tobacco Use  ? Smoking status: Former  ?  Packs/day: 2.00  ?  Years: 30.00  ?  Pack years: 60.00  ?  Types: Cigarettes  ? Smokeless tobacco: Never  ? Tobacco comments:  ?  quit 2006  ?Vaping Use  ? Vaping Use: Never used  ?Substance Use Topics  ? Alcohol use: No  ?  Alcohol/week: 0.0 standard drinks  ? Drug use: No  ?  ? ?Allergies  ?Allergen Reactions  ? Amlodipine   ?  Lower extremity swelling  ? Metformin And Related   ?  headaches  ? ? ?Health Maintenance  ?Topic Date Due  ? Zoster Vaccines-  Shingrix (1 of 2) Never done  ? DEXA SCAN  Never done  ? COVID-19 Vaccine (3 - Pfizer risk series) 05/08/2020  ? HEMOGLOBIN A1C  09/14/2021  ? INFLUENZA VACCINE  11/18/2021  ? OPHTHALMOLOGY EXAM  01/14/2022  ? FOOT EXAM  03/11/2022  ? Pneumonia Vaccine 76+ Years old  Completed  ? Hepatitis C Screening  Completed  ? HPV VACCINES  Aged Out  ? MAMMOGRAM  Discontinued  ? TETANUS/TDAP  Discontinued  ? ? ?Chart Review Today: ?I personally reviewed active problem list, medication list, allergies, family history, social  history, health maintenance, notes from last encounter, lab results, imaging with the patient/caregiver today. ? ? ?Review of Systems  ? ?Objective:  ? ?Vitals:  ? 08/22/21 0823 08/22/21 0902  ?BP: 136/68   ?Pulse: 74   ?Resp: 18   ?Temp: 98.4 ?F (36.9 ?C)   ?SpO2: (!) 88% 93%  ?Weight: 214 lb 4.8 oz (97.2 kg)   ?Height: 5\' 1"  (1.549 m)   ? ? ?Body mass index is 40.49 kg/m?. ? ?Physical Exam ?Vitals and nursing note reviewed.  ?Constitutional:   ?   General: She is not in acute distress. ?   Appearance: Normal appearance. She is well-developed and well-groomed. She is morbidly obese. She is ill-appearing (chronically ill). She is not toxic-appearing or diaphoretic.  ?HENT:  ?   Head: Normocephalic and atraumatic.  ?   Right Ear: External ear normal.  ?   Left Ear: External ear normal.  ?Eyes:  ?   General: No scleral icterus.    ?   Right eye: No discharge.     ?   Left eye: No discharge.  ?   Conjunctiva/sclera: Conjunctivae normal.  ?Cardiovascular:  ?   Rate and Rhythm: Normal rate and regular rhythm.  ?   Pulses: Normal pulses.  ?   Heart sounds: Normal heart sounds.  ?Pulmonary:  ?   Effort: Pulmonary effort is normal. No respiratory distress.  ?   Breath sounds: Normal breath sounds. No stridor. No wheezing or rales.  ?Abdominal:  ?   General: Bowel sounds are normal.  ?   Palpations: Abdomen is soft.  ?Skin: ?   Capillary Refill: Capillary refill takes less than 2 seconds.  ?   Coloration: Skin is  pale (slight). Skin is not jaundiced.  ?   Findings: No lesion or rash.  ?   Nails: There is clubbing.  ?Neurological:  ?   Mental Status: She is alert. Mental status is at baseline.  ?   Gait: Gait abnormal.

## 2021-08-22 NOTE — Assessment & Plan Note (Signed)
Blood pressure well controlled and at goal today, continue enalapril, carvedilol and hydrochlorothiazide ?

## 2021-08-23 LAB — CBC WITH DIFFERENTIAL/PLATELET
Absolute Monocytes: 678 cells/uL (ref 200–950)
Basophils Absolute: 62 cells/uL (ref 0–200)
Basophils Relative: 0.7 %
Eosinophils Absolute: 229 cells/uL (ref 15–500)
Eosinophils Relative: 2.6 %
HCT: 41.1 % (ref 35.0–45.0)
Hemoglobin: 13.1 g/dL (ref 11.7–15.5)
Lymphs Abs: 2429 cells/uL (ref 850–3900)
MCH: 28.2 pg (ref 27.0–33.0)
MCHC: 31.9 g/dL — ABNORMAL LOW (ref 32.0–36.0)
MCV: 88.6 fL (ref 80.0–100.0)
MPV: 10.1 fL (ref 7.5–12.5)
Monocytes Relative: 7.7 %
Neutro Abs: 5403 cells/uL (ref 1500–7800)
Neutrophils Relative %: 61.4 %
Platelets: 381 10*3/uL (ref 140–400)
RBC: 4.64 10*6/uL (ref 3.80–5.10)
RDW: 13.2 % (ref 11.0–15.0)
Total Lymphocyte: 27.6 %
WBC: 8.8 10*3/uL (ref 3.8–10.8)

## 2021-08-23 LAB — COMPLETE METABOLIC PANEL WITH GFR
AG Ratio: 1.3 (calc) (ref 1.0–2.5)
ALT: 9 U/L (ref 6–29)
AST: 13 U/L (ref 10–35)
Albumin: 4 g/dL (ref 3.6–5.1)
Alkaline phosphatase (APISO): 89 U/L (ref 37–153)
BUN/Creatinine Ratio: 19 (calc) (ref 6–22)
BUN: 29 mg/dL — ABNORMAL HIGH (ref 7–25)
CO2: 28 mmol/L (ref 20–32)
Calcium: 9.4 mg/dL (ref 8.6–10.4)
Chloride: 99 mmol/L (ref 98–110)
Creat: 1.54 mg/dL — ABNORMAL HIGH (ref 0.60–1.00)
Globulin: 3.1 g/dL (calc) (ref 1.9–3.7)
Glucose, Bld: 191 mg/dL — ABNORMAL HIGH (ref 65–99)
Potassium: 5.3 mmol/L (ref 3.5–5.3)
Sodium: 139 mmol/L (ref 135–146)
Total Bilirubin: 0.4 mg/dL (ref 0.2–1.2)
Total Protein: 7.1 g/dL (ref 6.1–8.1)
eGFR: 35 mL/min/{1.73_m2} — ABNORMAL LOW (ref >=60)

## 2021-08-23 LAB — HEMOGLOBIN A1C
Hgb A1c MFr Bld: 7.5 % of total Hgb — ABNORMAL HIGH (ref <5.7)
Mean Plasma Glucose: 169 mg/dL
eAG (mmol/L): 9.3 mmol/L

## 2021-08-26 ENCOUNTER — Other Ambulatory Visit: Payer: Self-pay

## 2021-08-26 DIAGNOSIS — M1612 Unilateral primary osteoarthritis, left hip: Secondary | ICD-10-CM | POA: Diagnosis not present

## 2021-08-26 DIAGNOSIS — N179 Acute kidney failure, unspecified: Secondary | ICD-10-CM

## 2021-08-26 DIAGNOSIS — Z96652 Presence of left artificial knee joint: Secondary | ICD-10-CM | POA: Diagnosis not present

## 2021-08-27 ENCOUNTER — Telehealth: Payer: Self-pay

## 2021-08-27 NOTE — Telephone Encounter (Signed)
Copied from [place] [id number]. Topic: General - Other ?>> Aug 27, 2021  2:16 PM Valere Dross wrote: ?Reason for CRM: Pt called in stating she is need her labs results that she did on 05/05 printed out for her to pick up, please advise. ?

## 2021-08-27 NOTE — Telephone Encounter (Signed)
Printed and placed up front for patient. Patient notified and she stated she would pick up 08/28/21.  ?

## 2021-09-04 DIAGNOSIS — M1612 Unilateral primary osteoarthritis, left hip: Secondary | ICD-10-CM | POA: Insufficient documentation

## 2021-09-16 ENCOUNTER — Ambulatory Visit: Payer: Medicare Other | Admitting: Internal Medicine

## 2021-09-16 ENCOUNTER — Encounter: Payer: Self-pay | Admitting: Internal Medicine

## 2021-09-16 VITALS — BP 120/60 | HR 61 | Temp 97.9°F | Ht 61.0 in | Wt 213.6 lb

## 2021-09-16 DIAGNOSIS — J9611 Chronic respiratory failure with hypoxia: Secondary | ICD-10-CM

## 2021-09-16 DIAGNOSIS — J449 Chronic obstructive pulmonary disease, unspecified: Secondary | ICD-10-CM | POA: Diagnosis not present

## 2021-09-16 DIAGNOSIS — R0689 Other abnormalities of breathing: Secondary | ICD-10-CM | POA: Diagnosis not present

## 2021-09-16 MED ORDER — TRELEGY ELLIPTA 200-62.5-25 MCG/ACT IN AEPB: 1.0000 | INHALATION_SPRAY | Freq: Every day | RESPIRATORY_TRACT | 0 refills | Status: DC

## 2021-09-16 MED ORDER — TRELEGY ELLIPTA 200-62.5-25 MCG/ACT IN AEPB: 1.0000 | INHALATION_SPRAY | RESPIRATORY_TRACT | 10 refills | Status: AC

## 2021-09-16 NOTE — Patient Instructions (Signed)
PATIENT WITH MODERATE TO SEVERE COPD RECOMMEND STARTING TRELEGY 200 INH THERAPY  CONTINUE OXYGEN AS PRESCRIBED  PATIENT  IS AT HIGH RISK FOR LUNG PROBLEMS WITH ANY TYPE OF SURGERY

## 2021-09-16 NOTE — Progress Notes (Signed)
[facility] [place] [facility]monary Medicine     Date: 09/16/2021  MRN# [id number] Julie Keith 1945/08/24  CC Patient with moderate to severe COPD FEV1 50% predicted Patient also with respiratory insufficiency Chronic respiratory failure with hypoxia   HPI:   76 year old obese white female with COPD Had seen several pulmonologist in the past Patient has been on chronic oxygen therapy for several years Last PFT in 2016 shows significant reduction in FEV1 to 51% predicted She has chronic shortness of breath and dyspnea on exertion 2 L of oxygen usually at rest 3 L with activity Her exercise tolerance has decreased over the last several years  No exacerbation at this time No evidence of heart failure at this time No evidence or signs of infection at this time No respiratory distress No fevers, chills, nausea, vomiting, diarrhea No evidence of lower extremity edema No evidence hemoptysis   Patient underwent left knee replacement in 2022 and seems to have done okay postoperatively but says she has had a lot of problems  Patient is to undergo left hip replacement surgery next month however I have relayed to patient she is at a moderate to high risk for pulmonary complications  She has not been on any type of inhaler therapy and I recommend starting Trelegy 200   **Personally review of PFT tracings;  08/02/14 FVC is 61%, FEV1 of 51%, with significant reversibility. RV to TLC ratio was elevated 133%. Diffusion capacity is normal at 88% Flow volume loop is consistent with obstruction. -Overall this test consistent with moderate obstructive lung disease with reversibility.  chest x-ray 2 view images from 02/18/16: There is hyperinflation, flattening of the diaphragms consistent with emphysema.  Medication:   Outpatient Encounter Medications as of 09/16/2021  Medication Sig   acetaminophen (TYLENOL) 500 MG tablet Take 1,000 mg by mouth every 8 (eight) hours as needed for moderate pain.    albuterol (VENTOLIN HFA) 108 (90 Base) MCG/ACT inhaler Inhale 2 puffs into the lungs every 6 (six) hours as needed for wheezing or shortness of breath.   Apremilast (OTEZLA) 30 MG TABS Take 30 mg by mouth in the morning and at bedtime.   aspirin 81 MG chewable tablet Chew 1 tablet (81 mg total) by mouth 2 (two) times daily.   carvedilol (COREG) 6.25 MG tablet Take 1 tablet (6.25 mg total) by mouth 2 (two) times daily.   cetirizine (ZYRTEC) 10 MG tablet Take 1 tablet (10 mg total) by mouth daily.   enalapril (VASOTEC) 20 MG tablet Take 1 tablet (20 mg total) by mouth 2 (two) times daily.   fluticasone (FLONASE) 50 MCG/ACT nasal spray Place 2 sprays into both nostrils daily.   glimepiride (AMARYL) 4 MG tablet Take 1 tablet by mouth once daily with breakfast   glucose blood (ONETOUCH ULTRA) test strip USE 1 STRIP TO CHECK GLUCOSE IN THE MORNING, AT NOON, AND AT BEDTIME.   hydrochlorothiazide (HYDRODIURIL) 25 MG tablet Take 1 tablet (25 mg total) by mouth daily.   pantoprazole (PROTONIX) 20 MG tablet Take 1 tablet (20 mg total) by mouth daily.   pravastatin (PRAVACHOL) 80 MG tablet Take 1 tablet (80 mg total) by mouth daily.   No facility-administered encounter medications on file as of 09/16/2021.     Allergies:  Amlodipine and Metformin and related     Review of Systems: Gen:  Denies  fever, sweats, chills weight loss  HEENT: Denies blurred vision, double vision, ear pain, eye pain, hearing loss, nose bleeds, sore throat Cardiac:  No dizziness, chest pain or heaviness, chest tightness,edema, No JVD Resp:   No cough, -sputum production, +shortness of breath,-wheezing, -hemoptysis,  Other:  All other systems negative  BP 120/60 (BP Location: Left Arm, Cuff Size: Normal)   Pulse 61   Temp 97.9 F (36.6 C) (Temporal)   Ht 5\' 1"  (1.549 m)   Wt 213 lb 9.6 oz (96.9 kg)   SpO2 90%   BMI 40.36 kg/m   Physical Examination:   General Appearance: No distress  EYES PERRLA, EOM intact.    NECK Supple, No JVD Pulmonary: normal breath sounds, No wheezing.  CardiovascularNormal S1,S2.  No m/r/g.   Abdomen: Benign, Soft, non-tender. 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  Recommend starting Trelegy inhaler therapy 200  Will seek medical assistance program with [employer] company   Respiratory insufficiency  Start inhaler therapy and continue oxygen therapy  Exercise as tolerated   Chronic hypoxic respiratory failure  Continue oxygen as prescribed  Patient needs this for survival      PRE-OP ASSESSMENT  FOR HIP SURGERY  Surgical preop assessment Patient is a moderate to high risk for postop and Intra-Op complications due to her Lung disease  I have discussed that there is always a increased risk Pulmonary Infection, increased chance of Respiratory Failure and Cardiac Arrest, increased chance of pneumothorax and collapsed lung, as well as increased Stroke and Death. I have explained Risks to patient   At this time, Patient is at optimal medical management.    General Risk Reduction Strategies: - All patients warrant post-operative incentive spirometry. For those with obstruction, also consider flutter valve. - Early ambulation, PT/OT - DVT prophylaxis where appropriate - Adequate pain control without oversedation   MEDICATION ADJUSTMENTS/LABS AND TESTS ORDERED: TRELEGY 200   CURRENT MEDICATIONS REVIEWED AT LENGTH WITH PATIENT TODAY   Patient  satisfied with Plan of action and management. All questions answered  Follow up  6 months  Total Time Spent  35 mins   [employer] Wallis Bamberg, M.D.  [facility]  Medical Director [facility] Medical Director [facility] Cardio-Pulmonary Department

## 2021-09-26 ENCOUNTER — Telehealth: Payer: Self-pay

## 2021-09-26 NOTE — Progress Notes (Signed)
Chronic Care Management Pharmacy Assistant   Name: Julie Keith  MRN: [id number] DOB: 08/21/1945  Reason for Encounter: Diabetes Disease State Call   Recent office visits:  05/05 Henry Russel, PA-C (PCP Office Visit) for Type II DM- Stopped: Benzonatate 200 mg, Prednisone 10 mg, Lab orders placed, Referral to Pulmonology placed, Patient to follow-up in 6 months  05/30/2021 Della Goo, FNP (PCP Office Visit) for Viral Upper RTI- Started: Azithromycin 250 mg, Benzonatate 200 mg, Cetrizine HCl 10 mg, Fluticasone Propionate 50 mcg, Prednisone 10 mg, Stopped: Ipratropium-Albuterol 0.5-2.5 mg patient not taking, No orders placed  Recent consult visits:  09/16/2021 Erin Fulling, MD (Pulmonary) for COPD- Started: Trelegy 200-62.5-25 mcg 1 puff daily, No orders placed, Patient to follow-up in 6 months  Hospital visits:  None in previous 6 months  Medications: Outpatient Encounter Medications as of 09/26/2021  Medication Sig   acetaminophen (TYLENOL) 500 MG tablet Take 1,000 mg by mouth every 8 (eight) hours as needed for moderate pain.   albuterol (VENTOLIN HFA) 108 (90 Base) MCG/ACT inhaler Inhale 2 puffs into the lungs every 6 (six) hours as needed for wheezing or shortness of breath.   Apremilast (OTEZLA) 30 MG TABS Take 30 mg by mouth in the morning and at bedtime.   aspirin 81 MG chewable tablet Chew 1 tablet (81 mg total) by mouth 2 (two) times daily.   carvedilol (COREG) 6.25 MG tablet Take 1 tablet (6.25 mg total) by mouth 2 (two) times daily.   cetirizine (ZYRTEC) 10 MG tablet Take 1 tablet (10 mg total) by mouth daily.   enalapril (VASOTEC) 20 MG tablet Take 1 tablet (20 mg total) by mouth 2 (two) times daily.   fluticasone (FLONASE) 50 MCG/ACT nasal spray Place 2 sprays into both nostrils daily.   Fluticasone-Umeclidin-Vilant (TRELEGY ELLIPTA) 200-62.5-25 MCG/ACT AEPB Inhale 1 puff into the lungs daily.   glimepiride (AMARYL) 4 MG tablet Take 1 tablet by mouth once daily with  breakfast   glucose blood (ONETOUCH ULTRA) test strip USE 1 STRIP TO CHECK GLUCOSE IN THE MORNING, AT NOON, AND AT BEDTIME.   hydrochlorothiazide (HYDRODIURIL) 25 MG tablet Take 1 tablet (25 mg total) by mouth daily.   pantoprazole (PROTONIX) 20 MG tablet Take 1 tablet (20 mg total) by mouth daily.   pravastatin (PRAVACHOL) 80 MG tablet Take 1 tablet (80 mg total) by mouth daily.   No facility-administered encounter medications on file as of 09/26/2021.   Care Gaps: None ID  Star Rating Drugs: Enalapril 20 mg last filled on 09/26/2021 for a 90-Day supply with [facility] Glimepiride 4 mg last filled on 08/06/2021 for a 90-Day supply with [facility] Pravastatin 80 mg last filled on 09/26/2021 for a 90-Day supply with [facility] Recent Relevant Labs: Lab Results  Component Value Date/Time   HGBA1C 7.5 (H) 08/22/2021 08:38 AM   HGBA1C 7.0 (H) 03/17/2021 09:40 AM   HGBA1C 7.5 01/10/2016 12:00 AM   MICROALBUR 2.1 04/15/2020 11:16 AM   MICROALBUR 2.4 12/27/2018 12:00 AM   MICROALBUR 100 03/05/2015 07:47 AM    Kidney Function Lab Results  Component Value Date/Time   CREATININE 1.54 (H) 08/22/2021 08:38 AM   CREATININE 1.09 (H) 03/17/2021 09:40 AM   GFRNONAA 59 (L) 06/18/2020 05:02 AM   GFRNONAA 47 (L) 04/15/2020 11:16 AM   GFRAA 55 (L) 04/15/2020 11:16 AM   Current antihyperglycemic regimen:  Glimepiride 4 mg 1 tablet daily  What recent interventions/DTPs have been made to improve glycemic control:  None  ID  Have there been any recent hospitalizations or ED visits since last visit with CPP? No  Patient denies hypoglycemic symptoms, including Pale, Sweaty, Shaky, Hungry, Nervous/irritable, and Vision changes Patient denies hyperglycemic symptoms, including blurry vision, excessive thirst, fatigue, polyuria, and weakness  How often are you checking your blood sugar? once daily  What are your blood sugars ranging?  Fasting: 116 Yesterday's number as she has not  taken it this morning  During the week, how often does your blood glucose drop below 70? Never Are you checking your feet daily/regularly? Yes  Adherence Review: Is the patient currently on a STATIN medication? Yes Is the patient currently on ACE/ARB medication? Yes Does the patient have >5 day gap between last estimated fill dates? No  Per patient she is doing pretty good. Patient denies any ill symptoms at this time. Patient does advise on 06/28 she will be having hip replacement surgery and is trying to get all her affairs in order. Patient stated that she lives alone and her son is assisting her as much as possible. Per patient once she discharges she will have Home healthcare in place.   I encouraged the patient that since she is having surgery and lives alone that she should contact her insurance to see if they provide meal benefits for her upon discharge. I informed her that some insurance companies provide meal deliveries if a patient has been admitted to the hospital and this may benefit her since she lives alone. I provided the patient with [employer] number as well as my direct number if she needs any assistance.  Patient has a telephone visit with Angelena Sole, CPP on 10/29/2021 @ 0830.  Adelene Idler, CPA/CMA Clinical Pharmacist Assistant Phone: [phone]

## 2021-10-06 ENCOUNTER — Telehealth: Payer: Self-pay | Admitting: Cardiovascular Disease

## 2021-10-06 ENCOUNTER — Telehealth: Payer: Self-pay | Admitting: *Deleted

## 2021-10-06 NOTE — Telephone Encounter (Signed)
Preoperative team, please contact this patient and set up a phone call appointment for further cardiac evaluation.  Thank you for your help.  Thomasene Ripple. Annelle Behrendt NP-C    10/06/2021, 3:15 PM [facility] [address] Office [phone] Fax [phone]

## 2021-10-06 NOTE — Telephone Encounter (Signed)
Pt agreeable to plan of care for tele pre op appt 10/07/21 @ 9:40 per pt request. Med rec and consent are done.     Patient Consent for Virtual Visit        Julie Keith has provided verbal consent on 10/06/2021 for a virtual visit (video or telephone).   CONSENT FOR VIRTUAL VISIT FOR:  Julie Keith  By participating in this virtual visit I agree to the following:  I hereby voluntarily request, consent and authorize [facility] and its employed or contracted physicians, physician assistants, nurse practitioners or other licensed health care professionals (the Practitioner), to provide me with telemedicine health care services (the "Services") as deemed necessary by the treating Practitioner. I acknowledge and consent to receive the Services by the Practitioner via telemedicine. I understand that the telemedicine visit will involve communicating with the Practitioner through live audiovisual communication technology and the disclosure of certain medical information by electronic transmission. I acknowledge that I have been given the opportunity to request an in-person assessment or other available alternative prior to the telemedicine visit and am voluntarily participating in the telemedicine visit.  I understand that I have the right to withhold or withdraw my consent to the use of telemedicine in the course of my care at any time, without affecting my right to future care or treatment, and that the Practitioner or I may terminate the telemedicine visit at any time. I understand that I have the right to inspect all information obtained and/or recorded in the course of the telemedicine visit and may receive copies of available information for a reasonable fee.  I understand that some of the potential risks of receiving the Services via telemedicine include:  Delay or interruption in medical evaluation due to technological equipment failure or disruption; Information transmitted may not be  sufficient (e.g. poor resolution of images) to allow for appropriate medical decision making by the Practitioner; and/or  In rare instances, security protocols could fail, causing a breach of personal health information.  Furthermore, I acknowledge that it is my responsibility to provide information about my medical history, conditions and care that is complete and accurate to the best of my ability. I acknowledge that Practitioner's advice, recommendations, and/or decision may be based on factors not within their control, such as incomplete or inaccurate data provided by me or distortions of diagnostic images or specimens that may result from electronic transmissions. I understand that the practice of medicine is not an exact science and that Practitioner makes no warranties or guarantees regarding treatment outcomes. I acknowledge that a copy of this consent can be made available to me via my patient portal [facility] MyChart), or I can request a printed copy by calling the office of [facility].    I understand that my insurance will be billed for this visit.   I have read or had this consent read to me. I understand the contents of this consent, which adequately explains the benefits and risks of the Services being provided via telemedicine.  I have been provided ample opportunity to ask questions regarding this consent and the Services and have had my questions answered to my satisfaction. I give my informed consent for the services to be provided through the use of telemedicine in my medical care

## 2021-10-06 NOTE — Telephone Encounter (Signed)
Pt agreeable to plan of care for tele pre op appt 10/07/21 @ 9:40 per pt request. Med rec and consent are done.

## 2021-10-06 NOTE — Telephone Encounter (Signed)
   Pre-operative Risk Assessment    Patient Name: Julie Keith  DOB: June 23, 1945 MRN: [id number]      Request for Surgical Clearance    Procedure:  Left Total Hip Arthroplasty  Date of Surgery:  Clearance 10/15/21                                 Surgeon:  Aggie Moats Group or Practice Name:  [facility] Phone number:  [phone] Fax number:  [phone]   Type of Clearance Requested:   - Medical    Type of Anesthesia:  Not Indicated   Additional requests/questions:    Signed, Pilar A Ham   10/06/2021, 3:01 PM

## 2021-10-07 ENCOUNTER — Ambulatory Visit (INDEPENDENT_AMBULATORY_CARE_PROVIDER_SITE_OTHER): Payer: Medicare Other | Admitting: Physician Assistant

## 2021-10-07 ENCOUNTER — Encounter: Payer: Self-pay | Admitting: Orthopedic Surgery

## 2021-10-07 ENCOUNTER — Other Ambulatory Visit: Payer: Self-pay | Admitting: Orthopedic Surgery

## 2021-10-07 DIAGNOSIS — Z01810 Encounter for preprocedural cardiovascular examination: Secondary | ICD-10-CM | POA: Diagnosis not present

## 2021-10-07 DIAGNOSIS — Z01818 Encounter for other preprocedural examination: Secondary | ICD-10-CM

## 2021-10-07 NOTE — H&P (Signed)
NAME: Julie Keith MRN:   [id number] DOB:   1945/06/19     HISTORY AND PHYSICAL  CHIEF COMPLAINT:  left hip pain  HISTORY:   Julie Fulgham Blalockis a 76 y.o. female  with left  Hip Pain Patient complains of left hip pain. Onset of the symptoms was several years ago. Inciting event: known DJD. The patient reports the hip pain is worse with weight bearing. Associated symptoms: none. Aggravating symptoms include: any weight bearing. Patient has had prior hip problems. Previous visits for this problem: multiple, this is a longstanding diagnosis. Last seen several weeks ago by me. Evaluation to date: plain films, which were abnormal  osteoarthritis . Treatment to date: OTC analgesics, which have been somewhat effective, prescription analgesics, which have been somewhat effective, and physical therapy, which has been somewhat effective.   Plan for left total hip replacement  PAST MEDICAL HISTORY:   Past Medical History:  Diagnosis Date   Allergic rhinitis    Diabetes (HCC)    Dyspnea    Emphysema lung (HCC)    GERD (gastroesophageal reflux disease) 10/10/2015   History of echocardiogram    a. 12/2020 Echo: EF 60-65%, no rwma, Nl RV size/fxn, mildly dil LA. No significant valvular dzs.   Hx of tobacco use, presenting hazards to health 04/08/2016   Hypercholesteremia    Hypertension    Palpitations    a. 07/2016 Zio: RSR, avg HR 67 (50-126), 2 SVT episodes, likely A tach - fastest 12 beats @ 154. 40,027 isolated PACs (13.5% burden).   Premature atrial contractions    Psoriasis 10/10/2015    PAST SURGICAL HISTORY:   Past Surgical History:  Procedure Laterality Date   ABDOMINAL HYSTERECTOMY     BACK SURGERY     CERVICAL SPINE SURGERY     CHOLECYSTECTOMY     KNEE CLOSED REDUCTION Left 10/14/2020   Procedure: CLOSED MANIPULATION KNEE UNDER ANESTHESIA;  Surgeon: Lyndle Herrlich, MD;  Location: [facility] ORS;  Service: Orthopedics;  Laterality: Left;   MASTECTOMY Bilateral    SPINAL CORD STIMULATOR  BATTERY EXCHANGE     Osteostimulator in back   TOTAL KNEE ARTHROPLASTY Left 06/17/2020   Procedure: TOTAL KNEE ARTHROPLASTY;  Surgeon: Lyndle Herrlich, MD;  Location: [facility] ORS;  Service: Orthopedics;  Laterality: Left;    MEDICATIONS:  (Not in a hospital admission)   ALLERGIES:   Allergies  Allergen Reactions   Amlodipine     Lower extremity swelling   Metformin And Related     headaches    REVIEW OF SYSTEMS:   Negative except HPI  FAMILY HISTORY:   Family History  Problem Relation Age of Onset   Leukemia Mother    Pneumonia Father    COPD Father    Diabetes Brother    Cancer Brother        lung cancer   Diabetes Sister     SOCIAL HISTORY:   reports that she quit smoking about 33 years ago. Her smoking use included cigarettes. She has a 60.00 pack-year smoking history. She has never used smokeless tobacco. She reports that she does not drink alcohol and does not use drugs.  PHYSICAL EXAM:  General appearance: alert, cooperative, and no distress Neck: no JVD and supple, symmetrical, trachea midline Resp: clear to auscultation bilaterally Cardio: regular rate and rhythm, S1, S2 normal, no murmur, click, rub or gallop GI: soft, non-tender; bowel sounds normal; no masses,  no organomegaly Extremities: extremities normal, atraumatic, no cyanosis or edema and Homans sign  is negative, no sign of DVT Pulses: 2+ and symmetric Skin: Skin color, texture, turgor normal. No rashes or lesions    LABORATORY STUDIES: No results for input(s): "WBC", "HGB", "HCT", "PLT" in the last 72 hours.  No results for input(s): "NA", "K", "CL", "CO2", "GLUCOSE", "BUN", "CREATININE", "CALCIUM" in the last 72 hours.  STUDIES/RESULTS:  No results found.  ASSESSMENT:  End stage osteoarthritis left hip        Active Problems:   * No active hospital problems. *    PLAN:  Left Primary Total Hip   [name] 10/07/2021. 8:40 AM

## 2021-10-07 NOTE — Progress Notes (Signed)
Virtual Visit via Telephone Note   Because of Julie Keith's co-morbid illnesses, she is at least at moderate risk for complications without adequate follow up.  This format is felt to be most appropriate for this patient at this time.  The patient did not have access to video technology/had technical difficulties with video requiring transitioning to audio format only (telephone).  All issues noted in this document were discussed and addressed.  No physical exam could be performed with this format.  Please refer to the patient's chart for her consent to telehealth for [facility].  Evaluation Performed:  Preoperative cardiovascular risk assessment _____________   Date:  10/07/2021   Patient ID:  Julie Keith, Julie Keith 09-09-1945, MRN [id number] Patient Location:  Home Provider location:   Office  Primary Care Provider:  Danelle Berry, PA-C Primary Cardiologist:  Lorine Bears, MD  Chief Complaint / Patient Profile   76 y.o. y/o female with a h/o PACs, DM, HTN, prior smoking, COPD, and atrial tachycardia who is pending Left Total Hip Arthroplasty and presents today for telephonic preoperative cardiovascular risk assessment.  Past Medical History    Past Medical History:  Diagnosis Date   Allergic rhinitis    Diabetes (HCC)    Dyspnea    Emphysema lung (HCC)    GERD (gastroesophageal reflux disease) 10/10/2015   History of echocardiogram    a. 12/2020 Echo: EF 60-65%, no rwma, Nl RV size/fxn, mildly dil LA. No significant valvular dzs.   Hx of tobacco use, presenting hazards to health 04/08/2016   Hypercholesteremia    Hypertension    Palpitations    a. 07/2016 Zio: RSR, avg HR 67 (50-126), 2 SVT episodes, likely A tach - fastest 12 beats @ 154. 40,027 isolated PACs (13.5% burden).   Premature atrial contractions    Psoriasis 10/10/2015   Past Surgical History:  Procedure Laterality Date   ABDOMINAL HYSTERECTOMY     BACK SURGERY     CERVICAL SPINE SURGERY      CHOLECYSTECTOMY     KNEE CLOSED REDUCTION Left 10/14/2020   Procedure: CLOSED MANIPULATION KNEE UNDER ANESTHESIA;  Surgeon: Lyndle Herrlich, MD;  Location: [facility] ORS;  Service: Orthopedics;  Laterality: Left;   MASTECTOMY Bilateral    SPINAL CORD STIMULATOR BATTERY EXCHANGE     Osteostimulator in back   TOTAL KNEE ARTHROPLASTY Left 06/17/2020   Procedure: TOTAL KNEE ARTHROPLASTY;  Surgeon: Lyndle Herrlich, MD;  Location: [facility] ORS;  Service: Orthopedics;  Laterality: Left;    Allergies  Allergies  Allergen Reactions   Amlodipine     Lower extremity swelling   Metformin And Related     headaches    History of Present Illness    Julie Keith is a 76 y.o. female who presents via audio/video conferencing for a telehealth visit today.  Pt was last seen in cardiology clinic on 04/24/21 by Dr. Kirke Corin.  At that time Julie Keith was doing well.  The patient is now pending procedure as outlined above. Since her last visit, she continues to do well, although she is sedentary, limited by knee and hip pain. She had TKA ~1 year ago and continues to have pain limiting her mobility. She had a reassuring echocardiogram 12/2020 with LVEF 60-65% and no significant valvular disease. Unfortunately, she is very sedentary.    Home Medications    Prior to Admission medications   Medication Sig Start Date End Date Taking? Authorizing Provider  acetaminophen (TYLENOL) 500 MG tablet Take 1,000  mg by mouth every 8 (eight) hours as needed for moderate pain.    [provider]  albuterol (VENTOLIN HFA) 108 (90 Base) MCG/ACT inhaler Inhale 2 puffs into the lungs every 6 (six) hours as needed for wheezing or shortness of breath. 07/17/20   Danelle Berry, PA-C  Apremilast (OTEZLA) 30 MG TABS Take 30 mg by mouth in the morning and at bedtime.    [provider]  aspirin 81 MG chewable tablet Chew 1 tablet (81 mg total) by mouth 2 (two) times daily. 06/18/20   [name], PA-C  carvedilol (COREG) 6.25  MG tablet Take 1 tablet (6.25 mg total) by mouth 2 (two) times daily. 01/06/21   Creig Hines, NP  cetirizine (ZYRTEC) 10 MG tablet Take 1 tablet (10 mg total) by mouth daily. 05/30/21   Berniece Salines, FNP  enalapril (VASOTEC) 20 MG tablet Take 1 tablet (20 mg total) by mouth 2 (two) times daily. 08/22/21   Danelle Berry, PA-C  fluticasone (FLONASE) 50 MCG/ACT nasal spray Place 2 sprays into both nostrils daily. 05/30/21   Berniece Salines, FNP  Fluticasone-Umeclidin-Vilant (TRELEGY ELLIPTA) 200-62.5-25 MCG/ACT AEPB Inhale 1 puff into the lungs daily. 09/16/21   Erin Fulling, MD  glimepiride (AMARYL) 4 MG tablet Take 1 tablet by mouth once daily with breakfast 07/14/21   Berniece Salines, FNP  glucose blood (ONETOUCH ULTRA) test strip USE 1 STRIP TO CHECK GLUCOSE IN THE MORNING, AT NOON, AND AT BEDTIME. 07/16/21   Berniece Salines, FNP  hydrochlorothiazide (HYDRODIURIL) 25 MG tablet Take 1 tablet (25 mg total) by mouth daily. 08/22/21   Danelle Berry, PA-C  pantoprazole (PROTONIX) 20 MG tablet Take 1 tablet (20 mg total) by mouth daily. 11/08/20   Danelle Berry, PA-C  pravastatin (PRAVACHOL) 80 MG tablet Take 1 tablet (80 mg total) by mouth daily. 11/08/20   Danelle Berry, PA-C    Physical Exam    Vital Signs:  Julie Keith does not have vital signs available for review today.  Given telephonic nature of communication, physical exam is limited. AAOx3. NAD. Normal affect.  Speech and respirations are unlabored.  Accessory Clinical Findings    None  Assessment & Plan    1.  Preoperative Cardiovascular Risk Assessment:  Prior to her knee surgery, she was able to easily complete greater than 4 METS.  Since her knee surgery, she has been limited by knee and hip pain.  She denies symptoms related to angina.  She does not have a history of ischemic heart disease.  She had a reassuring echocardiogram 12/2020.  She understands that she may have unrealized risk of Mace, but at this time remains low  risk for moderate risk procedure.  She understands her risk and wishes to proceed.  The patient was advised that if she develops new symptoms prior to surgery to contact our office to arrange for a follow-up visit, and she verbalized understanding.    A copy of this note will be routed to requesting surgeon.  Time:   Today, I have spent 10 minutes with the patient with telehealth technology discussing medical history, symptoms, and management plan.     Roe Rutherford Karn Hockey, PA  10/07/2021, 10:34 AM

## 2021-10-09 ENCOUNTER — Encounter
Admission: RE | Admit: 2021-10-09 | Discharge: 2021-10-09 | Disposition: A | Discharge status: Home / Self Care | Payer: Medicare Other | Source: Ambulatory Visit | Attending: Orthopedic Surgery | Admitting: Orthopedic Surgery

## 2021-10-09 ENCOUNTER — Other Ambulatory Visit: Payer: Self-pay

## 2021-10-09 ENCOUNTER — Other Ambulatory Visit: Payer: Self-pay | Admitting: Orthopedic Surgery

## 2021-10-09 VITALS — BP 143/55 | HR 62 | Resp 18 | Ht 65.0 in | Wt 211.2 lb

## 2021-10-09 DIAGNOSIS — Z01818 Encounter for other preprocedural examination: Secondary | ICD-10-CM

## 2021-10-09 DIAGNOSIS — Z01812 Encounter for preprocedural laboratory examination: Secondary | ICD-10-CM

## 2021-10-09 HISTORY — DX: Cardiac arrhythmia, unspecified: I49.9

## 2021-10-09 HISTORY — DX: Chronic kidney disease, unspecified: N18.9

## 2021-10-09 HISTORY — DX: Diffuse cystic mastopathy of unspecified breast: N60.19

## 2021-10-09 HISTORY — DX: Unspecified osteoarthritis, unspecified site: M19.90

## 2021-10-09 HISTORY — DX: Cardiac murmur, unspecified: R01.1

## 2021-10-09 LAB — URINALYSIS, ROUTINE W REFLEX MICROSCOPIC
Bilirubin Urine: NEGATIVE
Glucose, UA: NEGATIVE mg/dL
Hgb urine dipstick: NEGATIVE
Ketones, ur: NEGATIVE mg/dL
Leukocytes,Ua: NEGATIVE
Nitrite: NEGATIVE
Protein, ur: NEGATIVE mg/dL
Specific Gravity, Urine: 1.016 (ref 1.005–1.030)
pH: 7 (ref 5.0–8.0)

## 2021-10-09 LAB — BASIC METABOLIC PANEL
Anion gap: 9 (ref 5–15)
BUN: 24 mg/dL — ABNORMAL HIGH (ref 8–23)
CO2: 31 mmol/L (ref 22–32)
Calcium: 9.4 mg/dL (ref 8.9–10.3)
Chloride: 98 mmol/L (ref 98–111)
Creatinine, Ser: 1.19 mg/dL — ABNORMAL HIGH (ref 0.44–1.00)
GFR, Estimated: 47 mL/min — ABNORMAL LOW (ref >60)
Glucose, Bld: 137 mg/dL — ABNORMAL HIGH (ref 70–99)
Potassium: 4.2 mmol/L (ref 3.5–5.1)
Sodium: 138 mmol/L (ref 135–145)

## 2021-10-09 LAB — CBC
HCT: 41.3 % (ref 36.0–46.0)
Hemoglobin: 12.6 g/dL (ref 12.0–15.0)
MCH: 27.6 pg (ref 26.0–34.0)
MCHC: 30.5 g/dL (ref 30.0–36.0)
MCV: 90.4 fL (ref 80.0–100.0)
Platelets: 352 10*3/uL (ref 150–400)
RBC: 4.57 MIL/uL (ref 3.87–5.11)
RDW: 13.5 % (ref 11.5–15.5)
WBC: 9.6 10*3/uL (ref 4.0–10.5)
nRBC: 0 % (ref 0.0–0.2)

## 2021-10-09 LAB — SURGICAL PCR SCREEN
MRSA, PCR: NEGATIVE
Staphylococcus aureus: NEGATIVE

## 2021-10-09 LAB — TYPE AND SCREEN
ABO/RH(D): AB POS
Antibody Screen: NEGATIVE

## 2021-10-09 NOTE — Patient Instructions (Addendum)
Your procedure is scheduled on: 10/15/21 - Wednesday Report to the Registration Desk on the 1st floor of the [facility]. To find out your arrival time, please call [phone] between 1PM - 3PM on: 10/14/21 - Tuesday  If your arrival time is 6:00 am, do not arrive prior to that time as the [facility] entrance doors do not open until 6:00 am.  REMEMBER: Instructions that are not followed completely may result in serious medical risk, up to and including death; or upon the discretion of your surgeon and anesthesiologist your surgery may need to be rescheduled.  Do not eat food or drink any fluids after midnight the night before surgery.  No gum chewing, lozengers or hard candies.  TAKE ONLY THESE MEDICATIONS THE MORNING OF SURGERY WITH A SIP OF WATER:  - Apremilast (OTEZLA) 30 MG TABS  - carvedilol (COREG) 6.25 MG tablet  - pantoprazole (PROTONIX) 20 MG tablet, (take one the night before and one on the morning of surgery - helps to prevent nausea after surgery.)  Use albuterol (VENTOLIN HFA) 108 (90 Base) MCG/ACT inhaler on the day of surgery and bring to the hospital.  STOP- Aspirin 81 mg was stopped on 10/08/21.  One week prior to surgery: Stop Anti-inflammatories (NSAIDS) such as Advil, Aleve, Ibuprofen, Motrin, Naproxen, Naprosyn and Aspirin based products such as Excedrin, Goodys Powder, BC Powder.  Stop ANY OVER THE COUNTER supplements until after surgery.  You may take Tylenol if needed for pain up until the day of surgery.  No Alcohol for 24 hours before or after surgery.  No Smoking including e-cigarettes for 24 hours prior to surgery.  No chewable tobacco products for at least 6 hours prior to surgery.  No nicotine patches on the day of surgery.  Do not use any "recreational" drugs for at least a week prior to your surgery.  Please be advised that the combination of cocaine and anesthesia may have negative outcomes, up to and including death. If you test positive  for cocaine, your surgery will be cancelled.  On the morning of surgery brush your teeth with toothpaste and water, you may rinse your mouth with mouthwash if you wish. Do not swallow any toothpaste or mouthwash.  Use CHG Soap or wipes as directed on instruction sheet.  Do not wear jewelry, make-up, hairpins, clips or nail polish.  Do not wear lotions, powders, or perfumes.   Do not shave body from the neck down 48 hours prior to surgery just in case you cut yourself which could leave a site for infection.  Also, freshly shaved skin may become irritated if using the CHG soap.  Contact lenses, hearing aids and dentures may not be worn into surgery.  Do not bring valuables to the hospital. [facility] is not responsible for any missing/lost belongings or valuables.   Notify your doctor if there is any change in your medical condition (cold, fever, infection).  Wear comfortable clothing (specific to your surgery type) to the hospital.  After surgery, you can help prevent lung complications by doing breathing exercises.  Take deep breaths and cough every 1-2 hours. Your doctor may order a device called an Incentive Spirometer to help you take deep breaths. When coughing or sneezing, hold a pillow firmly against your incision with both hands. This is called "splinting." Doing this helps protect your incision. It also decreases belly discomfort.  If you are being admitted to the hospital overnight, leave your suitcase in the car. After surgery it may  be brought to your room.  If you are being discharged the day of surgery, you will not be allowed to drive home. You will need a responsible adult (18 years or older) to drive you home and stay with you that night.   If you are taking public transportation, you will need to have a responsible adult (18 years or older) with you. Please confirm with your physician that it is acceptable to use public transportation.   Please call the  [place] Dept. at [phone] if you have any questions about these instructions.  Surgery Visitation Policy:  Patients undergoing a surgery or procedure may have two family members or support persons with them as long as the person is not COVID-19 positive or experiencing its symptoms.   Inpatient Visitation:    Visiting hours are 7 a.m. to 8 p.m. Up to four visitors are allowed at one time in a patient room, including children. The visitors may rotate out with other people during the day. One designated support person (adult) may remain overnight.

## 2021-10-14 NOTE — Anesthesia Preprocedure Evaluation (Addendum)
Anesthesia Evaluation  Patient identified by MRN, date of birth, ID band Patient awake    Reviewed: Allergy & Precautions, H&P , NPO status , Patient's Chart, lab work & pertinent test results, reviewed documented beta blocker date and time   Airway Mallampati: II   Neck ROM: full    Dental  (+) Poor Dentition, Missing   Pulmonary shortness of breath, COPD,  oxygen dependent, former smoker,  Former - 60 pack years moderate to severe COPD FEV1 50% predicted 2 L at night Respiratory insufficiency   Pulmonary exam normal        Cardiovascular Exercise Tolerance: Poor hypertension, On Medications and Pt. on home beta blockers + DOE  Normal cardiovascular exam+ dysrhythmias (H/O PAC and palpitations)  Rhythm:regular Rate:Normal  12/2020 Echo: EF 60-65%, no rwma, Nl RV size/fxn, mildly dil LA. No significant valvular dzs.   Neuro/Psych negative neurological ROS  negative psych ROS   GI/Hepatic Neg liver ROS, GERD  Medicated,  Endo/Other  diabetes, Poorly Controlled, Type 2, Oral Hypoglycemic AgentsA1C 7.5  Renal/GU Renal disease  negative genitourinary   Musculoskeletal  (+) Arthritis ,   Abdominal (+) + obese,   Peds  Hematology negative hematology ROS (+)   Anesthesia Other Findings Past Medical History: Patient has non functioning spinal pain stimulator  No date: Allergic rhinitis No date: Diabetes (HCC) No date: Dyspnea No date: Emphysema lung (HCC) 10/10/2015: GERD (gastroesophageal reflux disease) 04/08/2016: Hx of tobacco use, presenting hazards to health No date: Hypercholesteremia No date: Hypertension 10/10/2015: Psoriasis Past Surgical History: No date: ABDOMINAL HYSTERECTOMY No date: BACK SURGERY No date: CERVICAL SPINE SURGERY No date: CHOLECYSTECTOMY No date: MASTECTOMY; Bilateral No date: SPINAL CORD STIMULATOR BATTERY EXCHANGE     Comment:  Osteostimulator in back 06/17/2020: TOTAL KNEE  ARTHROPLASTY; Left     Comment:  Procedure: TOTAL KNEE ARTHROPLASTY;  Surgeon: Lyndle Herrlich, MD;  Location: [facility] ORS;  Service: Orthopedics;               Laterality: Left; BMI    Body Mass Index: 34.11 kg/m     Reproductive/Obstetrics negative OB ROS                            Anesthesia Physical  Anesthesia Plan  ASA: 3  Anesthesia Plan: General   Post-op Pain Management: Tylenol PO (pre-op)* and Regional block*   Induction: Intravenous  PONV Risk Score and Plan: 4 or greater and TIVA  Airway Management Planned: Oral ETT  Additional Equipment:   Intra-op Plan:   Post-operative Plan: Extubation in OR  Informed Consent: I have reviewed the patients History and Physical, chart, labs and discussed the procedure including the risks, benefits and alternatives for the proposed anesthesia with the patient or authorized representative who has indicated his/her understanding and acceptance.     Dental Advisory Given  Plan Discussed with: CRNA  Anesthesia Plan Comments:       Anesthesia Quick Evaluation

## 2021-10-15 ENCOUNTER — Other Ambulatory Visit: Payer: Self-pay

## 2021-10-15 ENCOUNTER — Encounter
Admission: RE | Disposition: A | Discharge status: Home Health | Payer: Self-pay | Source: Home / Self Care | Attending: Orthopedic Surgery

## 2021-10-15 ENCOUNTER — Observation Stay
Admission: RE | Admit: 2021-10-15 | Discharge: 2021-10-16 | Disposition: A | Discharge status: Home Health | Payer: Medicare Other | Attending: Orthopedic Surgery | Admitting: Orthopedic Surgery

## 2021-10-15 ENCOUNTER — Ambulatory Visit: Payer: Medicare Other | Admitting: Urgent Care

## 2021-10-15 ENCOUNTER — Encounter: Payer: Self-pay | Admitting: Orthopedic Surgery

## 2021-10-15 ENCOUNTER — Ambulatory Visit: Payer: Medicare Other

## 2021-10-15 DIAGNOSIS — N189 Chronic kidney disease, unspecified: Secondary | ICD-10-CM | POA: Insufficient documentation

## 2021-10-15 DIAGNOSIS — M1612 Unilateral primary osteoarthritis, left hip: Principal | ICD-10-CM | POA: Insufficient documentation

## 2021-10-15 DIAGNOSIS — Z96642 Presence of left artificial hip joint: Secondary | ICD-10-CM

## 2021-10-15 DIAGNOSIS — Z4789 Encounter for other orthopedic aftercare: Secondary | ICD-10-CM | POA: Diagnosis not present

## 2021-10-15 DIAGNOSIS — Z471 Aftercare following joint replacement surgery: Secondary | ICD-10-CM | POA: Diagnosis not present

## 2021-10-15 DIAGNOSIS — I129 Hypertensive chronic kidney disease with stage 1 through stage 4 chronic kidney disease, or unspecified chronic kidney disease: Secondary | ICD-10-CM | POA: Diagnosis not present

## 2021-10-15 DIAGNOSIS — Z01812 Encounter for preprocedural laboratory examination: Secondary | ICD-10-CM

## 2021-10-15 DIAGNOSIS — E1122 Type 2 diabetes mellitus with diabetic chronic kidney disease: Secondary | ICD-10-CM | POA: Insufficient documentation

## 2021-10-15 DIAGNOSIS — K219 Gastro-esophageal reflux disease without esophagitis: Secondary | ICD-10-CM | POA: Diagnosis not present

## 2021-10-15 HISTORY — PX: TOTAL HIP ARTHROPLASTY: SHX124

## 2021-10-15 LAB — POCT I-STAT, CHEM 8
BUN: 28 mg/dL — ABNORMAL HIGH (ref 8–23)
Calcium, Ion: 1.13 mmol/L — ABNORMAL LOW (ref 1.15–1.40)
Chloride: 99 mmol/L (ref 98–111)
Creatinine, Ser: 1.5 mg/dL — ABNORMAL HIGH (ref 0.44–1.00)
Glucose, Bld: 119 mg/dL — ABNORMAL HIGH (ref 70–99)
HCT: 41 % (ref 36.0–46.0)
Hemoglobin: 13.9 g/dL (ref 12.0–15.0)
Potassium: 4.2 mmol/L (ref 3.5–5.1)
Sodium: 140 mmol/L (ref 135–145)
TCO2: 29 mmol/L (ref 22–32)

## 2021-10-15 LAB — GLUCOSE, CAPILLARY: Glucose-Capillary: 185 mg/dL — ABNORMAL HIGH (ref 70–99)

## 2021-10-15 SURGERY — ARTHROPLASTY, HIP, TOTAL, ANTERIOR APPROACH
Anesthesia: General | Site: Hip | Laterality: Left

## 2021-10-15 MED ORDER — OXYCODONE HCL 5 MG PO TABS
ORAL_TABLET | ORAL | Status: AC
  Filled 2021-10-15: qty 1

## 2021-10-15 MED ORDER — APREMILAST 30 MG PO TABS: 30.0000 mg | ORAL_TABLET | Freq: Two times a day (BID) | ORAL | Status: DC

## 2021-10-15 MED ORDER — CEFAZOLIN SODIUM-DEXTROSE 2-4 GM/100ML-% IV SOLN
2.0000 g | Freq: Four times a day (QID) | INTRAVENOUS | Status: AC
  Administered 2021-10-15: 2 g via INTRAVENOUS
  Filled 2021-10-15 (×2): qty 100

## 2021-10-15 MED ORDER — BISACODYL 10 MG RE SUPP: 10.0000 mg | Freq: Every day | RECTAL | Status: DC | PRN

## 2021-10-15 MED ORDER — SURGIPHOR WOUND IRRIGATION SYSTEM - OPTIME
TOPICAL | Status: DC | PRN
  Administered 2021-10-15: 450 mL via TOPICAL

## 2021-10-15 MED ORDER — LORATADINE 10 MG PO TABS
10.0000 mg | ORAL_TABLET | Freq: Every day | ORAL | Status: DC
  Administered 2021-10-15 – 2021-10-16 (×2): 10 mg via ORAL
  Filled 2021-10-15: qty 1

## 2021-10-15 MED ORDER — EPHEDRINE SULFATE (PRESSORS) 50 MG/ML IJ SOLN
INTRAMUSCULAR | Status: DC | PRN
  Administered 2021-10-15: 10 mg via INTRAVENOUS
  Administered 2021-10-15: 5 mg via INTRAVENOUS
  Administered 2021-10-15: 10 mg via INTRAVENOUS

## 2021-10-15 MED ORDER — STERILE WATER FOR IRRIGATION IR SOLN
Status: DC | PRN
  Administered 2021-10-15: 1000 mL

## 2021-10-15 MED ORDER — CARVEDILOL 3.125 MG PO TABS
6.2500 mg | ORAL_TABLET | Freq: Two times a day (BID) | ORAL | Status: DC
  Administered 2021-10-15 – 2021-10-16 (×2): 6.25 mg via ORAL
  Filled 2021-10-15 (×2): qty 2

## 2021-10-15 MED ORDER — TRANEXAMIC ACID-NACL 1000-0.7 MG/100ML-% IV SOLN
1000.0000 mg | INTRAVENOUS | Status: AC
  Administered 2021-10-15: 1000 mg via INTRAVENOUS

## 2021-10-15 MED ORDER — BUPIVACAINE-EPINEPHRINE (PF) 0.25% -1:200000 IJ SOLN
INTRAMUSCULAR | Status: AC
  Filled 2021-10-15: qty 30

## 2021-10-15 MED ORDER — LIDOCAINE HCL (CARDIAC) PF 100 MG/5ML IV SOSY
PREFILLED_SYRINGE | INTRAVENOUS | Status: DC | PRN
  Administered 2021-10-15: 100 mg via INTRAVENOUS

## 2021-10-15 MED ORDER — ACETAMINOPHEN 325 MG PO TABS: 325.0000 mg | ORAL_TABLET | Freq: Four times a day (QID) | ORAL | Status: DC | PRN

## 2021-10-15 MED ORDER — ROCURONIUM BROMIDE 100 MG/10ML IV SOLN
INTRAVENOUS | Status: DC | PRN
  Administered 2021-10-15: 70 mg via INTRAVENOUS
  Administered 2021-10-15: 30 mg via INTRAVENOUS

## 2021-10-15 MED ORDER — SODIUM CHLORIDE 0.9 % IV SOLN: INTRAVENOUS | Status: DC

## 2021-10-15 MED ORDER — CEFAZOLIN SODIUM-DEXTROSE 2-4 GM/100ML-% IV SOLN
INTRAVENOUS | Status: AC
  Administered 2021-10-15: 2 g via INTRAVENOUS
  Filled 2021-10-15: qty 100

## 2021-10-15 MED ORDER — PHENYLEPHRINE HCL-NACL 20-0.9 MG/250ML-% IV SOLN
INTRAVENOUS | Status: DC | PRN
  Administered 2021-10-15: 30 ug/min via INTRAVENOUS

## 2021-10-15 MED ORDER — ONDANSETRON HCL 4 MG PO TABS: 4.0000 mg | ORAL_TABLET | Freq: Four times a day (QID) | ORAL | Status: DC | PRN

## 2021-10-15 MED ORDER — CEFAZOLIN SODIUM-DEXTROSE 2-4 GM/100ML-% IV SOLN
2.0000 g | INTRAVENOUS | Status: AC
  Administered 2021-10-15: 2 g via INTRAVENOUS

## 2021-10-15 MED ORDER — TIOTROPIUM BROMIDE MONOHYDRATE 18 MCG IN CAPS: 1.0000 | ORAL_CAPSULE | Freq: Every day | RESPIRATORY_TRACT | Status: DC

## 2021-10-15 MED ORDER — MENTHOL 3 MG MT LOZG: 1.0000 | LOZENGE | OROMUCOSAL | Status: DC | PRN

## 2021-10-15 MED ORDER — BUPIVACAINE-EPINEPHRINE (PF) 0.25% -1:200000 IJ SOLN
INTRAMUSCULAR | Status: DC | PRN
  Administered 2021-10-15: 30 mL

## 2021-10-15 MED ORDER — OXYCODONE HCL 5 MG PO TABS
5.0000 mg | ORAL_TABLET | Freq: Once | ORAL | Status: AC | PRN
  Administered 2021-10-15: 5 mg via ORAL

## 2021-10-15 MED ORDER — METOCLOPRAMIDE HCL 5 MG PO TABS: 5.0000 mg | ORAL_TABLET | Freq: Three times a day (TID) | ORAL | Status: DC | PRN

## 2021-10-15 MED ORDER — GLYCOPYRROLATE 0.2 MG/ML IJ SOLN
INTRAMUSCULAR | Status: DC | PRN
  Administered 2021-10-15: .2 mg via INTRAVENOUS

## 2021-10-15 MED ORDER — KETOROLAC TROMETHAMINE 30 MG/ML IJ SOLN
INTRAMUSCULAR | Status: DC | PRN
  Administered 2021-10-15: 15 mg via INTRAVENOUS

## 2021-10-15 MED ORDER — ENALAPRIL MALEATE 20 MG PO TABS
20.0000 mg | ORAL_TABLET | Freq: Two times a day (BID) | ORAL | Status: DC
  Administered 2021-10-15 – 2021-10-16 (×3): 20 mg via ORAL
  Filled 2021-10-15 (×4): qty 1

## 2021-10-15 MED ORDER — POVIDONE-IODINE 10 % EX SWAB: 2.0000 | Freq: Once | CUTANEOUS | Status: DC

## 2021-10-15 MED ORDER — DEXAMETHASONE SODIUM PHOSPHATE 10 MG/ML IJ SOLN
INTRAMUSCULAR | Status: DC | PRN
  Administered 2021-10-15: 5 mg via INTRAVENOUS

## 2021-10-15 MED ORDER — DOCUSATE SODIUM 100 MG PO CAPS
100.0000 mg | ORAL_CAPSULE | Freq: Two times a day (BID) | ORAL | Status: DC
  Administered 2021-10-15 – 2021-10-16 (×3): 100 mg via ORAL
  Filled 2021-10-15 (×2): qty 1

## 2021-10-15 MED ORDER — FENTANYL CITRATE (PF) 100 MCG/2ML IJ SOLN
INTRAMUSCULAR | Status: AC
  Filled 2021-10-15: qty 2

## 2021-10-15 MED ORDER — GLIMEPIRIDE 4 MG PO TABS
4.0000 mg | ORAL_TABLET | Freq: Every day | ORAL | Status: DC
  Administered 2021-10-16: 4 mg via ORAL
  Filled 2021-10-15: qty 1

## 2021-10-15 MED ORDER — ORAL CARE MOUTH RINSE: 15.0000 mL | Freq: Once | OROMUCOSAL | Status: AC

## 2021-10-15 MED ORDER — OXYCODONE HCL 5 MG/5ML PO SOLN: 5.0000 mg | Freq: Once | ORAL | Status: AC | PRN

## 2021-10-15 MED ORDER — 0.9 % SODIUM CHLORIDE (POUR BTL) OPTIME
TOPICAL | Status: DC | PRN
  Administered 2021-10-15: 500 mL

## 2021-10-15 MED ORDER — METOCLOPRAMIDE HCL 5 MG/ML IJ SOLN: 5.0000 mg | Freq: Three times a day (TID) | INTRAMUSCULAR | Status: DC | PRN

## 2021-10-15 MED ORDER — PROPOFOL 10 MG/ML IV BOLUS
INTRAVENOUS | Status: DC | PRN
  Administered 2021-10-15: 130 mg via INTRAVENOUS

## 2021-10-15 MED ORDER — ASPIRIN 81 MG PO CHEW
81.0000 mg | CHEWABLE_TABLET | Freq: Two times a day (BID) | ORAL | Status: DC
Start: 2021-10-15 — End: 2021-10-16
  Administered 2021-10-15 – 2021-10-16 (×2): 81 mg via ORAL
  Filled 2021-10-15 (×2): qty 1

## 2021-10-15 MED ORDER — PROPOFOL 1000 MG/100ML IV EMUL
INTRAVENOUS | Status: AC
  Filled 2021-10-15: qty 100

## 2021-10-15 MED ORDER — ACETAMINOPHEN 10 MG/ML IV SOLN: 1000.0000 mg | Freq: Once | INTRAVENOUS | Status: DC | PRN

## 2021-10-15 MED ORDER — SUGAMMADEX SODIUM 200 MG/2ML IV SOLN
INTRAVENOUS | Status: DC | PRN
  Administered 2021-10-15: 400 mg via INTRAVENOUS
  Administered 2021-10-15: 200 mg via INTRAVENOUS

## 2021-10-15 MED ORDER — FLUTICASONE PROPIONATE 50 MCG/ACT NA SUSP: 2.0000 | Freq: Every day | NASAL | Status: DC

## 2021-10-15 MED ORDER — PROMETHAZINE HCL 25 MG/ML IJ SOLN: 6.2500 mg | INTRAMUSCULAR | Status: DC | PRN

## 2021-10-15 MED ORDER — DROPERIDOL 2.5 MG/ML IJ SOLN: 0.6250 mg | Freq: Once | INTRAMUSCULAR | Status: DC | PRN

## 2021-10-15 MED ORDER — LACTATED RINGERS IV SOLN
INTRAVENOUS | Status: DC
Start: 2021-10-15 — End: 2021-10-16

## 2021-10-15 MED ORDER — ONDANSETRON HCL 4 MG/2ML IJ SOLN: 4.0000 mg | Freq: Four times a day (QID) | INTRAMUSCULAR | Status: DC | PRN

## 2021-10-15 MED ORDER — MORPHINE SULFATE (PF) 4 MG/ML IV SOLN: 0.5000 mg | INTRAVENOUS | Status: DC | PRN

## 2021-10-15 MED ORDER — ONDANSETRON HCL 4 MG/2ML IJ SOLN
INTRAMUSCULAR | Status: DC | PRN
  Administered 2021-10-15: 4 mg via INTRAVENOUS

## 2021-10-15 MED ORDER — CHLORHEXIDINE GLUCONATE 0.12 % MT SOLN
OROMUCOSAL | Status: AC
  Administered 2021-10-15: 15 mL via OROMUCOSAL
  Filled 2021-10-15: qty 15

## 2021-10-15 MED ORDER — PANTOPRAZOLE SODIUM 20 MG PO TBEC
20.0000 mg | DELAYED_RELEASE_TABLET | Freq: Every day | ORAL | Status: DC
  Administered 2021-10-16: 20 mg via ORAL
  Filled 2021-10-15: qty 1

## 2021-10-15 MED ORDER — PHENYLEPHRINE HCL (PRESSORS) 10 MG/ML IV SOLN
INTRAVENOUS | Status: DC | PRN
  Administered 2021-10-15: 80 ug via INTRAVENOUS
  Administered 2021-10-15 (×2): 160 ug via INTRAVENOUS

## 2021-10-15 MED ORDER — HYDROCODONE-ACETAMINOPHEN 5-325 MG PO TABS
1.0000 | ORAL_TABLET | ORAL | Status: DC | PRN
  Administered 2021-10-16: 1 via ORAL
  Filled 2021-10-15: qty 1

## 2021-10-15 MED ORDER — HYDROCODONE-ACETAMINOPHEN 7.5-325 MG PO TABS: 1.0000 | ORAL_TABLET | ORAL | Status: DC | PRN

## 2021-10-15 MED ORDER — SODIUM CHLORIDE 0.9 % IV SOLN
INTRAVENOUS | Status: AC | PRN
  Administered 2021-10-15: 250 mL via INTRAMUSCULAR

## 2021-10-15 MED ORDER — BUPIVACAINE HCL (PF) 0.5 % IJ SOLN
INTRAMUSCULAR | Status: AC
Start: 2021-10-15 — End: ?
  Filled 2021-10-15: qty 10

## 2021-10-15 MED ORDER — KETOROLAC TROMETHAMINE 15 MG/ML IJ SOLN
7.5000 mg | Freq: Four times a day (QID) | INTRAMUSCULAR | Status: AC
  Administered 2021-10-15 – 2021-10-16 (×4): 7.5 mg via INTRAVENOUS
  Filled 2021-10-15 (×4): qty 1

## 2021-10-15 MED ORDER — FENTANYL CITRATE (PF) 100 MCG/2ML IJ SOLN
INTRAMUSCULAR | Status: DC | PRN
  Administered 2021-10-15 (×2): 50 ug via INTRAVENOUS

## 2021-10-15 MED ORDER — ACETAMINOPHEN 500 MG PO TABS
ORAL_TABLET | ORAL | Status: AC
  Administered 2021-10-15: 1000 mg via ORAL
  Filled 2021-10-15: qty 2

## 2021-10-15 MED ORDER — SODIUM CHLORIDE 0.9 % IR SOLN
Status: DC | PRN
  Administered 2021-10-15: 3000 mL

## 2021-10-15 MED ORDER — TRANEXAMIC ACID-NACL 1000-0.7 MG/100ML-% IV SOLN
INTRAVENOUS | Status: AC
  Filled 2021-10-15: qty 100

## 2021-10-15 MED ORDER — PHENOL 1.4 % MT LIQD
1.0000 | OROMUCOSAL | Status: DC | PRN
Start: 2021-10-15 — End: 2021-10-16

## 2021-10-15 MED ORDER — ALUM & MAG HYDROXIDE-SIMETH 200-200-20 MG/5ML PO SUSP: 30.0000 mL | ORAL | Status: DC | PRN

## 2021-10-15 MED ORDER — LORATADINE 10 MG PO TABS
ORAL_TABLET | ORAL | Status: AC
  Filled 2021-10-15: qty 1

## 2021-10-15 MED ORDER — PRAVASTATIN SODIUM 40 MG PO TABS
80.0000 mg | ORAL_TABLET | Freq: Every evening | ORAL | Status: DC
  Administered 2021-10-15: 80 mg via ORAL
  Filled 2021-10-15: qty 2

## 2021-10-15 MED ORDER — HYDROCHLOROTHIAZIDE 25 MG PO TABS
25.0000 mg | ORAL_TABLET | Freq: Every day | ORAL | Status: DC
  Administered 2021-10-15 – 2021-10-16 (×2): 25 mg via ORAL
  Filled 2021-10-15 (×2): qty 1

## 2021-10-15 MED ORDER — PHENYLEPHRINE HCL-NACL 20-0.9 MG/250ML-% IV SOLN
INTRAVENOUS | Status: AC
  Filled 2021-10-15: qty 250

## 2021-10-15 MED ORDER — PROPOFOL 500 MG/50ML IV EMUL
INTRAVENOUS | Status: DC | PRN
  Administered 2021-10-15: 75 ug/kg/min via INTRAVENOUS

## 2021-10-15 MED ORDER — ACETAMINOPHEN 500 MG PO TABS: 1000.0000 mg | ORAL_TABLET | Freq: Once | ORAL | Status: AC

## 2021-10-15 MED ORDER — DOCUSATE SODIUM 100 MG PO CAPS
ORAL_CAPSULE | ORAL | Status: AC
  Filled 2021-10-15: qty 1

## 2021-10-15 MED ORDER — MAGNESIUM HYDROXIDE 400 MG/5ML PO SUSP
30.0000 mL | Freq: Every day | ORAL | Status: DC | PRN
Start: 2021-10-15 — End: 2021-10-16

## 2021-10-15 MED ORDER — FENTANYL CITRATE (PF) 100 MCG/2ML IJ SOLN: 25.0000 ug | INTRAMUSCULAR | Status: DC | PRN

## 2021-10-15 MED ORDER — CHLORHEXIDINE GLUCONATE 0.12 % MT SOLN: 15.0000 mL | Freq: Once | OROMUCOSAL | Status: AC

## 2021-10-15 SURGICAL SUPPLY — 58 items
APL PRP STRL LF DISP 70% ISPRP (MISCELLANEOUS) ×1
BLADE SAGITTAL WIDE XTHICK NO (BLADE) ×2 IMPLANT
BNDG COHESIVE 4X5 TAN ST LF (GAUZE/BANDAGES/DRESSINGS) ×4 IMPLANT
BRUSH SCRUB EZ  4% CHG (MISCELLANEOUS) ×2
BRUSH SCRUB EZ 4% CHG (MISCELLANEOUS) ×1 IMPLANT
CHLORAPREP W/TINT 26 (MISCELLANEOUS) ×2 IMPLANT
COVER HOLE (Hips) ×1 IMPLANT
CUP R3 52MM (Hips) ×1 IMPLANT
DRAPE 3/4 80X56 (DRAPES) ×2 IMPLANT
DRAPE C-ARM 42X72 X-RAY (DRAPES) ×2 IMPLANT
DRAPE STERI IOBAN 125X83 (DRAPES) IMPLANT
DRAPE U-SHAPE 47X51 STRL (DRAPES) ×2 IMPLANT
DRSG AQUACEL AG ADV 3.5X10 (GAUZE/BANDAGES/DRESSINGS) ×1 IMPLANT
DRSG AQUACEL AG ADV 3.5X14 (GAUZE/BANDAGES/DRESSINGS) IMPLANT
ELECT REM PT RETURN 9FT ADLT (ELECTROSURGICAL) ×2
ELECTRODE REM PT RTRN 9FT ADLT (ELECTROSURGICAL) ×1 IMPLANT
GAUZE 4X4 16PLY ~~LOC~~+RFID DBL (SPONGE) ×2 IMPLANT
GAUZE XEROFORM 1X8 LF (GAUZE/BANDAGES/DRESSINGS) IMPLANT
GLOVE BIO SURGEON STRL SZ8 (GLOVE) ×2 IMPLANT
GLOVE BIOGEL PI IND STRL 8.5 (GLOVE) ×2 IMPLANT
GLOVE BIOGEL PI INDICATOR 8.5 (GLOVE) ×2
GLOVE SURG ORTHO 8.5 STRL (GLOVE) ×2 IMPLANT
GOWN STRL REUS W/ TWL XL LVL3 (GOWN DISPOSABLE) ×2 IMPLANT
GOWN STRL REUS W/TWL XL LVL3 (GOWN DISPOSABLE) ×4
HEAD FEMORAL TAPER 36MM P0 (Head) ×1 IMPLANT
HOLSTER ELECTROSUGICAL PENCIL (MISCELLANEOUS) ×2 IMPLANT
HOOD PEEL AWAY FLYTE STAYCOOL (MISCELLANEOUS) ×6 IMPLANT
IV NS 250ML (IV SOLUTION)
IV NS 250ML BAXH (IV SOLUTION) IMPLANT
IV NS IRRIG 3000ML ARTHROMATIC (IV SOLUTION) ×2 IMPLANT
KIT PATIENT CARE HANA TABLE (KITS) ×2 IMPLANT
KIT TURNOVER CYSTO (KITS) ×2 IMPLANT
LINER ACETAB 0 DEG (Liner) ×1 IMPLANT
MANIFOLD NEPTUNE II (INSTRUMENTS) ×2 IMPLANT
MAT ABSORB  FLUID 56X50 GRAY (MISCELLANEOUS) ×2
MAT ABSORB FLUID 56X50 GRAY (MISCELLANEOUS) ×1 IMPLANT
NDL SAFETY ECLIPSE 18X1.5 (NEEDLE) IMPLANT
NDL SPNL 20GX3.5 QUINCKE YW (NEEDLE) ×1 IMPLANT
NEEDLE HYPO 18GX1.5 SHARP (NEEDLE)
NEEDLE HYPO 22GX1.5 SAFETY (NEEDLE) ×2 IMPLANT
NEEDLE SPNL 20GX3.5 QUINCKE YW (NEEDLE) ×2 IMPLANT
PACK HIP PROSTHESIS (MISCELLANEOUS) ×2 IMPLANT
PADDING CAST BLEND 4X4 NS (MISCELLANEOUS) ×4 IMPLANT
PILLOW ABDUCTION MEDIUM (MISCELLANEOUS) ×2 IMPLANT
PULSAVAC PLUS IRRIG FAN TIP (DISPOSABLE) ×2
SCREW 6.5X25MM (Screw) ×1 IMPLANT
SOLUTION IRRIG SURGIPHOR (IV SOLUTION) ×2 IMPLANT
SPONGE T-LAP 18X18 ~~LOC~~+RFID (SPONGE) ×8 IMPLANT
STAPLER SKIN PROX 35W (STAPLE) ×2 IMPLANT
STEM LATERAL COLLAR SZ LAT 3 (Stem) ×1 IMPLANT
SUT BONE WAX W31G (SUTURE) ×2 IMPLANT
SUT DVC 2 QUILL PDO  T11 36X36 (SUTURE) ×2
SUT DVC 2 QUILL PDO T11 36X36 (SUTURE) ×1 IMPLANT
SUT VIC AB 2-0 CT1 18 (SUTURE) ×2 IMPLANT
SYR 20ML LL LF (SYRINGE) ×2 IMPLANT
TIP FAN IRRIG PULSAVAC PLUS (DISPOSABLE) ×1 IMPLANT
WAND WEREWOLF FASTSEAL 6.0 (MISCELLANEOUS) ×2 IMPLANT
WATER STERILE IRR 500ML POUR (IV SOLUTION) ×2 IMPLANT

## 2021-10-15 NOTE — Evaluation (Signed)
Physical Therapy Evaluation Patient Details Name: MARINELL IGARASHI MRN: [id number] DOB: [birth year] Today's Date: 10/15/2021  History of Present Illness  Patient is s/p L THA. PMH includes L TKR, COPD, HTN, DM, obesity, GERD  Clinical Impression  Patient received in bed, she is agreeable to PT session. Patient is mod independent with bed mobility, transfers with supervision and ambulated 25 feet with RW and min guard. She will continue to benefit from skilled PT to improve functional mobility, independence and strength.         Recommendations for follow up therapy are one component of a multi-disciplinary discharge planning process, led by the attending physician.  Recommendations may be updated based on patient status, additional functional criteria and insurance authorization.  Follow Up Recommendations Home health PT      Assistance Recommended at Discharge Intermittent Supervision/Assistance  Patient can return home with the following  A little help with walking and/or transfers;A little help with bathing/dressing/bathroom;Assist for transportation;Assistance with cooking/housework;Help with stairs or ramp for entrance    Equipment Recommendations Other (comment) (has walker and cane)  Recommendations for Other Services       Functional Status Assessment Patient has had a recent decline in their functional status and demonstrates the ability to make significant improvements in function in a reasonable and predictable amount of time.     Precautions / Restrictions Precautions Precautions: Fall Restrictions Weight Bearing Restrictions: Yes LLE Weight Bearing: Weight bearing as tolerated      Mobility  Bed Mobility Overal bed mobility: Modified Independent             General bed mobility comments: increased time and effort needed    Transfers Overall transfer level: Needs assistance Equipment used: Rolling walker (2 wheels) Transfers: Sit to/from Stand Sit to  Stand: Supervision                Ambulation/Gait Ambulation/Gait assistance: Min guard Gait Distance (Feet): 25 Feet Assistive device: Rolling walker (2 wheels) Gait Pattern/deviations: Step-to pattern, Decreased stance time - left, Decreased stride length, Antalgic Gait velocity: decr     General Gait Details: good effort, min pain with mobility this session  Stairs            Wheelchair Mobility    Modified Rankin (Stroke Patients Only)       Balance Overall balance assessment: Needs assistance Sitting-balance support: Feet supported Sitting balance-Leahy Scale: Good     Standing balance support: Bilateral upper extremity supported, During functional activity, Reliant on assistive device for balance Standing balance-Leahy Scale: Fair                               Pertinent Vitals/Pain Pain Assessment Pain Assessment: Faces Faces Pain Scale: Hurts a little bit Pain Location: L hip Pain Descriptors / Indicators: Discomfort Pain Intervention(s): Monitored during session, Premedicated before session, Repositioned    Home Living Family/patient expects to be discharged to:: Private residence Living Arrangements: Children Available Help at Discharge: Family;Available 24 hours/day Type of Home: House Home Access: Stairs to enter Entrance Stairs-Rails: Right Entrance Stairs-Number of Steps: 4   Home Layout: One level Home Equipment: Agricultural consultant (2 wheels);Cane - single point      Prior Function Prior Level of Function : Independent/Modified Independent             Mobility Comments: ambulating with cane prior to admission ADLs Comments: independent, living alone. Granddaughter will stay with her upon discharge  Hand Dominance        Extremity/Trunk Assessment   Upper Extremity Assessment Upper Extremity Assessment: Defer to OT evaluation    Lower Extremity Assessment Lower Extremity Assessment: LLE deficits/detail LLE  Deficits / Details: general post op    Cervical / Trunk Assessment Cervical / Trunk Assessment: Normal  Communication   Communication: No difficulties  Cognition Arousal/Alertness: Awake/alert Behavior During Therapy: WFL for tasks assessed/performed Overall Cognitive Status: Within Functional Limits for tasks assessed                                          General Comments      Exercises Total Joint Exercises Ankle Circles/Pumps: AROM, Both, 10 reps   Assessment/Plan    PT Assessment Patient needs continued PT services  PT Problem List Decreased strength;Decreased mobility;Decreased activity tolerance;Decreased balance;Pain;Decreased skin integrity;Obesity       PT Treatment Interventions DME instruction;Therapeutic exercise;Gait training;Balance training;Stair training;Functional mobility training;Therapeutic activities;Patient/family education    PT Goals (Current goals can be found in the Care Plan section)  Acute Rehab PT Goals Patient Stated Goal: to return home PT Goal Formulation: With patient Time For Goal Achievement: 10/22/21 Potential to Achieve Goals: Good    Frequency BID     Co-evaluation               AM-PAC PT "6 Clicks" Mobility  Outcome Measure Help needed turning from your back to your side while in a flat bed without using bedrails?: A Little Help needed moving from lying on your back to sitting on the side of a flat bed without using bedrails?: A Little Help needed moving to and from a bed to a chair (including a wheelchair)?: A Little Help needed standing up from a chair using your arms (e.g., wheelchair or bedside chair)?: A Little Help needed to walk in hospital room?: A Little Help needed climbing 3-5 steps with a railing? : A Little 6 Click Score: 18    End of Session Equipment Utilized During Treatment: Gait belt Activity Tolerance: Patient tolerated treatment well Patient left: in bed;with call bell/phone  within reach;with bed alarm set Nurse Communication: Mobility status PT Visit Diagnosis: Other abnormalities of gait and mobility (R26.89);Difficulty in walking, not elsewhere classified (R26.2);Pain Pain - Right/Left: Left Pain - part of body: Leg;Hip    Time: 1440-1458 PT Time Calculation (min) (ACUTE ONLY): 18 min   Charges:   PT Evaluation $PT Eval Moderate Complexity: 1 Mod PT Treatments $Gait Training: 8-22 mins        Ozias Dicenzo, PT, GCS 10/15/21,3:15 PM

## 2021-10-15 NOTE — Op Note (Signed)
10/15/2021  9:28 AM  PATIENT:  Julie Keith   MRN: [id number]  PRE-OPERATIVE DIAGNOSIS:  Osteoarthritis left hip   POST-OPERATIVE DIAGNOSIS: Same  Procedure: Left Total Hip Replacement  Surgeon: Dola Argyle. Odis Luster, MD   Assist: [name], PA-C  Anesthesia: Spinal   EBL: 100 mL   Specimens: None   Drains: None   Components used: A size 3 lateral Polarstem [employer]hew, R3 size 52 mm shell, and a 36 mm +0 mm head    Description of the procedure in detail: After informed consent was obtained and the appropriate extremity marked in the pre-operative holding area, the patient was taken to the operating room and placed in the supine position on the fracture table. All pressure points were well padded and bilateral lower extremities were place in traction spars. The hip was prepped and draped in standard sterile fashion. A spinal anesthetic had been delivered by the anesthesia team. The skin and subcutaneous tissues were injected with a mixture of Marcaine with epinephrine for post-operative pain. A longitudinal incision approximately 10 cm in length was carried out from the anterior superior iliac spine to the greater trochanter. The tensor fascia was divided and blunt dissection was taken down to the level of the joint capsule. The lateral circumflex vessels were cauterized. Deep retractors were placed and a portion of the anterior capsule was excised. Using fluoroscopy the neck cut was planned and carried out with a sagittal saw. The head was passed from the field with use of a corkscrew and hip skid. Deep retractors were placed along the acetabulum and the degenerative labrum and large osteophytes were removed with a Rongeur. The cup was sequentially reamed to a size 52 mm. The wound was irrigated and using fluoroscopy the size 52 mm cup was impacted in to anatomic position. A single screw was placed followed by a threaded hole cover. The final liner was impacted in to position.  Attention was then turned to the proximal femur. The leg was placed in extension and external rotation. The canal was opened and sequentially broached to a size 3 lateral. The trial components were placed and the hip relocated. The components were found to be in good position using fluoroscopy. The hip was dislocated and the trial components removed. The final components were impacted in to position and the hip relocated. The final components were again check with fluoroscopy and found to be in good position. Hemostasis was achieved with electrocautery. The deep capsule was injected with Marcaine and epinephrine. The wound was irrigated with bacitracin laced normal saline and the tensor fascia closed with #2 Quill suture. The subcutaneous tissues were closed with 2-0 vicryl and staples for the skin. A sterile dressing was applied and an abduction pillow. Patient tolerated the procedure well and there were no apparent complication. Patient was taken to the recovery room in good condition.   Cassell Smiles, MD

## 2021-10-15 NOTE — Plan of Care (Signed)

## 2021-10-15 NOTE — H&P (Signed)
The patient has been re-examined, and the chart reviewed, and there have been no interval changes to the documented history and physical.  Plan a left total hip today.  Anesthesia is not consulted regarding a peripheral nerve block for post-operative pain.  The risks, benefits, and alternatives have been discussed at length, and the patient is willing to proceed.    

## 2021-10-15 NOTE — Transfer of Care (Signed)
Immediate Anesthesia Transfer of Care Note  Patient: Julie Keith  Procedure(s) Performed: TOTAL HIP ARTHROPLASTY ANTERIOR APPROACH (Left: Hip)  Patient Location: PACU  Anesthesia Type:General  Level of Consciousness: awake and alert   Airway & Oxygen Therapy: Patient Spontanous Breathing and Patient connected to face mask oxygen  Post-op Assessment: Report given to RN and Post -op Vital signs reviewed and stable  Post vital signs: Reviewed and stable  Last Vitals:  Vitals Value Taken Time  BP 103/57 10/15/21 0938  Temp 36.4 C 10/15/21 0938  Pulse 50 10/15/21 0943  Resp 11 10/15/21 0943  SpO2 100 % 10/15/21 0943  Vitals shown include unvalidated device data.  Last Pain:  Vitals:   10/15/21 0938  TempSrc:   PainSc: 0-No pain         Complications: No notable events documented.

## 2021-10-15 NOTE — Anesthesia Procedure Notes (Addendum)
Procedure Name: Intubation Date/Time: 10/15/2021 7:49 AM  Performed by: Demetrius Charity, CRNAPre-anesthesia Checklist: Patient identified, Patient being monitored, Timeout performed, Emergency Drugs available and Suction available Patient Re-evaluated:Patient Re-evaluated prior to induction Oxygen Delivery Method: Circle system utilized Preoxygenation: Pre-oxygenation with 100% oxygen Induction Type: IV induction Ventilation: Mask ventilation without difficulty Laryngoscope Size: Mac and 3 Grade View: Grade II Tube type: Oral Tube size: 7.0 mm Number of attempts: 1 Airway Equipment and Method: Stylet Placement Confirmation: ETT inserted through vocal cords under direct vision, positive ETCO2 and breath sounds checked- equal and bilateral Secured at: 19 cm Tube secured with: Tape Dental Injury: Teeth and Oropharynx as per pre-operative assessment  Comments: Placed by Orland Mustard SRNA

## 2021-10-15 NOTE — Plan of Care (Signed)
  Problem: Clinical Measurements: Goal: Diagnostic test results will improve Outcome: Not Applicable Goal: Respiratory complications will improve Outcome: Not Applicable Goal: Cardiovascular complication will be avoided Outcome: Progressing   Problem: Activity: Goal: Risk for activity intolerance will decrease Outcome: Progressing   Problem: Nutrition: Goal: Adequate nutrition will be maintained Outcome: Progressing   Problem: Pain Managment: Goal: General experience of comfort will improve Outcome: Progressing   Problem: Safety: Goal: Ability to remain free from injury will improve Outcome: Progressing   Problem: Skin Integrity: Goal: Risk for impaired skin integrity will decrease Outcome: Progressing

## 2021-10-16 DIAGNOSIS — E1122 Type 2 diabetes mellitus with diabetic chronic kidney disease: Secondary | ICD-10-CM | POA: Diagnosis not present

## 2021-10-16 DIAGNOSIS — I129 Hypertensive chronic kidney disease with stage 1 through stage 4 chronic kidney disease, or unspecified chronic kidney disease: Secondary | ICD-10-CM | POA: Diagnosis not present

## 2021-10-16 DIAGNOSIS — N189 Chronic kidney disease, unspecified: Secondary | ICD-10-CM | POA: Diagnosis not present

## 2021-10-16 DIAGNOSIS — M1612 Unilateral primary osteoarthritis, left hip: Secondary | ICD-10-CM | POA: Diagnosis not present

## 2021-10-16 NOTE — Progress Notes (Signed)
Physical Therapy Treatment Patient Details Name: Julie Keith MRN: [id number] DOB: [birth year] Today's Date: 10/16/2021   History of Present Illness Patient is s/p L THA. PMH includes L TKR, COPD, HTN, DM, obesity, GERD    PT Comments    Patient received sitting up at edge of bed, dressed, ready to go home. Patient ambulated around nursing station ~200 feet with RW and supervision. Cues for safe proximity to rw and upright posture. Patient ambulated up/down 4 steps with B rails and supervision. She has met PT goals and is ready to discharge home with son.      Recommendations for follow up therapy are one component of a multi-disciplinary discharge planning process, led by the attending physician.  Recommendations may be updated based on patient status, additional functional criteria and insurance authorization.  Follow Up Recommendations  Home health PT     Assistance Recommended at Discharge Intermittent Supervision/Assistance  Patient can return home with the following A little help with walking and/or transfers;A little help with bathing/dressing/bathroom;Assist for transportation;Assistance with cooking/housework;Help with stairs or ramp for entrance   Equipment Recommendations  None recommended by PT    Recommendations for Other Services       Precautions / Restrictions Precautions Precautions: Fall Restrictions Weight Bearing Restrictions: Yes LLE Weight Bearing: Weight bearing as tolerated     Mobility  Bed Mobility               General bed mobility comments: patient received sitting up edge of bed with son present in room    Transfers Overall transfer level: Modified independent Equipment used: Rolling walker (2 wheels) Transfers: Sit to/from Stand Sit to Stand: Modified independent (Device/Increase time)                Ambulation/Gait Ambulation/Gait assistance: Supervision Gait Distance (Feet): 160 Feet Assistive device: Rolling walker (2  wheels) Gait Pattern/deviations: Step-through pattern, Decreased step length - right, Decreased step length - left, Trunk flexed Gait velocity: decr     General Gait Details: good ability, no real pain reported   Stairs Stairs: Yes Stairs assistance: Modified independent (Device/Increase time) Stair Management: Two rails, Step to pattern Number of Stairs: 4 General stair comments: safe on steps   Wheelchair Mobility    Modified Rankin (Stroke Patients Only)       Balance Overall balance assessment: Modified Independent Sitting-balance support: Feet supported Sitting balance-Leahy Scale: Normal     Standing balance support: Bilateral upper extremity supported, During functional activity, Reliant on assistive device for balance Standing balance-Leahy Scale: Good                              Cognition Arousal/Alertness: Awake/alert Behavior During Therapy: WFL for tasks assessed/performed Overall Cognitive Status: Within Functional Limits for tasks assessed                                          Exercises Other Exercises Other Exercises: Reviewed exercise program and gave handout. Patient is familiar with exercises as she recently had knee replacement    General Comments        Pertinent Vitals/Pain Pain Assessment Faces Pain Scale: Hurts a little bit Pain Location: L hip Pain Descriptors / Indicators: Discomfort Pain Intervention(s): Monitored during session    Home Living  Prior Function            PT Goals (current goals can now be found in the care plan section) Acute Rehab PT Goals Patient Stated Goal: to return home PT Goal Formulation: With patient Time For Goal Achievement: 10/22/21 Potential to Achieve Goals: Good Progress towards PT goals: Goals met/education completed, patient discharged from PT    Frequency    BID      PT Plan Current plan remains appropriate     Co-evaluation              AM-PAC PT "6 Clicks" Mobility   Outcome Measure  Help needed turning from your back to your side while in a flat bed without using bedrails?: A Little Help needed moving from lying on your back to sitting on the side of a flat bed without using bedrails?: A Little Help needed moving to and from a bed to a chair (including a wheelchair)?: A Little Help needed standing up from a chair using your arms (e.g., wheelchair or bedside chair)?: A Little Help needed to walk in hospital room?: A Little Help needed climbing 3-5 steps with a railing? : A Little 6 Click Score: 18    End of Session Equipment Utilized During Treatment: Gait belt Activity Tolerance: Patient tolerated treatment well Patient left: Other (comment) (seated on side of bed) Nurse Communication: Mobility status PT Visit Diagnosis: Other abnormalities of gait and mobility (R26.89);Difficulty in walking, not elsewhere classified (R26.2);Pain Pain - Right/Left: Left Pain - part of body: Leg;Hip     Time: 7494-4967 PT Time Calculation (min) (ACUTE ONLY): 19 min  Charges:  $Gait Training: 8-22 mins                     Murielle Stang, PT, GCS 10/16/21,10:21 AM

## 2021-10-16 NOTE — Progress Notes (Signed)
Met with the patient in the room at the bedside The patient lives at Home alone her son will be helping her The patient  currently has DME including a rolling walker and a 3 in 1 The patient will need no additional DME  They have transportation with her son They can afford their medication  They are set up with [facility] for Home health services

## 2021-10-16 NOTE — Anesthesia Postprocedure Evaluation (Signed)
Anesthesia Post Note  Patient: Julie Keith  Procedure(s) Performed: TOTAL HIP ARTHROPLASTY ANTERIOR APPROACH (Left: Hip)  Patient location during evaluation: PACU Anesthesia Type: General Level of consciousness: awake and alert Pain management: pain level controlled Vital Signs Assessment: post-procedure vital signs reviewed and stable Respiratory status: spontaneous breathing, nonlabored ventilation and respiratory function stable Cardiovascular status: blood pressure returned to baseline and stable Postop Assessment: no apparent nausea or vomiting Anesthetic complications: no   No notable events documented.   Last Vitals:  Vitals:   10/16/21 0449 10/16/21 0825  BP: (!) 133/53 (!) 125/49  Pulse: (!) 59 (!) 59  Resp: 17 17  Temp: 36.7 C   SpO2: 95% 94%    Last Pain:  Vitals:   10/16/21 0825  TempSrc:   PainSc: 0-No pain                 Foye Deer

## 2021-10-16 NOTE — Progress Notes (Signed)
Discharge note:  Pt given discharge instructions and verbalized understanding. Pt wheeled out by staff with all valuables and left via car driven by family.

## 2021-10-16 NOTE — Discharge Instructions (Signed)

## 2021-10-16 NOTE — Discharge Summary (Signed)
Physician Discharge Summary  Patient ID: Julie Keith MRN: [id number] DOB/AGE: Dec 14, 1945 76 y.o.  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 12/2020 Echo: EF 60-65%, no rwma, Nl RV size/fxn, mildly dil LA. No significant valvular dzs.   Hx of tobacco use, presenting hazards to health 04/08/2016   Hypercholesteremia    Hypertension    Palpitations    a. 07/2016 [facility]: RSR, avg HR 67 (50-126), 2 SVT episodes, likely A tach - fastest 12 beats @ 154. 40,027 isolated PACs (13.5% burden).   Premature atrial contractions    Psoriasis 10/10/2015    Surgeries: Procedure(s): TOTAL HIP ARTHROPLASTY ANTERIOR APPROACH on 10/15/2021   Consultants (if any):   Discharged Condition: Improved  Hospital Course: Julie Keith is an 76 y.o. 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    She was given perioperative antibiotics:  Anti-infectives (From admission, onward)    Start     Dose/Rate Route Frequency Ordered Stop   10/15/21 1400  ceFAZolin (ANCEF) IVPB 2g/100 mL premix        2 g 200 mL/hr over 30 Minutes Intravenous Every 6 hours 10/15/21 1027 10/15/21 2128   10/15/21 0620  ceFAZolin (ANCEF) 2-4 GM/100ML-% IVPB       Note to Pharmacy: Jonetta Speak E: cabinet override      10/15/21 0620 10/15/21 1403   10/15/21 0600  ceFAZolin (ANCEF) IVPB 2g/100 mL premix        2 g 200 mL/hr over 30 Minutes Intravenous On call to O.R. 10/15/21 0225 10/15/21 8916     .  She was given sequential compression devices, early ambulation, and aspirin 81 mg twice a day for 30 days for DVT prophylaxis.  She benefited maximally from the hospital stay and there were no complications.    Recent vital signs:  Vitals:   10/15/21 2054 10/16/21 0449  BP:  (!) 133/53  Pulse: 72 (!) 59  Resp:  17  Temp:  98.1 F (36.7 C)  SpO2: 93% 95%    Recent laboratory studies:  Lab Results  Component Value Date   HGB 13.9 10/15/2021   HGB 12.6 10/09/2021   HGB 13.1 08/22/2021   Lab Results  Component Value Date   WBC 9.6 10/09/2021   PLT 352 10/09/2021   Lab Results  Component Value Date   INR 1.0 06/12/2020   Lab Results  Component Value Date   NA 140 10/15/2021   K 4.2 10/15/2021   CL 99 10/15/2021   CO2 31 10/09/2021   BUN 28 (H) 10/15/2021   CREATININE 1.50 (H) 10/15/2021   GLUCOSE 119 (H) 10/15/2021    Discharge Medications:   Allergies as of 10/16/2021       Reactions  Amlodipine    Lower extremity swelling   Metformin And Related    headaches        Medication List     TAKE these medications    acetaminophen 500 MG tablet Commonly known as: TYLENOL Take 1,000 mg by mouth every 8 (eight) hours as needed for moderate pain.   aspirin 81 MG chewable tablet Chew 1 tablet (81 mg total) by mouth 2 (two) times daily.   carvedilol 6.25 MG tablet Commonly known as: COREG Take 1 tablet (6.25 mg total) by mouth 2 (two) times daily.   cetirizine 10 MG tablet Commonly known as: ZYRTEC Take 1 tablet (10 mg total) by mouth daily.   enalapril 20 MG tablet Commonly known as: VASOTEC Take 1 tablet (20 mg total) by mouth 2 (two) times daily.   fluticasone 50 MCG/ACT nasal spray Commonly known as: FLONASE Place 2 sprays  into both nostrils daily.   glimepiride 4 MG tablet Commonly known as: AMARYL Take 1 tablet by mouth once daily with breakfast   hydrochlorothiazide 25 MG tablet Commonly known as: HYDRODIURIL Take 1 tablet (25 mg total) by mouth daily.   OneTouch Ultra test strip Generic drug: glucose blood USE 1 STRIP TO CHECK GLUCOSE IN THE MORNING, AT NOON, AND AT BEDTIME.   Otezla 30 MG Tabs Generic drug: Apremilast Take 30 mg by mouth in the morning and at bedtime.   pantoprazole 20 MG tablet Commonly known as: PROTONIX Take 1 tablet (20 mg total) by mouth daily.   pravastatin 80 MG tablet Commonly known as: PRAVACHOL Take 1 tablet (80 mg total) by mouth daily.   Spiriva Respimat 2.5 MCG/ACT Aers Generic drug: Tiotropium Bromide Monohydrate Inhale 2 each into the lungs as needed.               Durable Medical Equipment  (From admission, onward)           Start     Ordered   10/16/21 [phone]  For home use only DME Walker rolling  Once       Question Answer Comment  Walker: With 5 Inch Wheels   Patient needs a walker to treat with the following condition Osteoarthritis of left hip      10/16/21 3875   10/16/21 0632  For home use only DME 3 n 1  Once        10/16/21 6433   10/15/21 1028  DME Walker rolling  Once       Question:  Patient needs a walker to treat with the following condition  Answer:  History of total hip replacement, left   10/15/21 1027   10/15/21 1028  DME 3 n 1  Once        10/15/21 1027   10/15/21 1028  DME Bedside commode  Once       Question:  Patient needs a bedside commode to treat with the following condition  Answer:  History of total hip replacement, left   10/15/21 1027            Diagnostic Studies: DG HIP UNILAT WITH PELVIS 1V LEFT  Result Date: 10/15/2021 CLINICAL DATA:  Left hip replacement EXAM: DG HIP (WITH OR WITHOUT PELVIS) 1V*L* COMPARISON:  None Available. FINDINGS: Fluoroscopic images were obtained intraoperatively and  submitted for post operative interpretation. Left total hip arthroplasty with hardware in expected position, 2 images were obtained with 9 seconds of fluoroscopy time and 1.23 mGy. Please see the performing provider's procedural report for further  detail. IMPRESSION: Fluoroscopic images demonstrate left total hip replacement. Electronically Signed   By: Allegra Lai M.D.   On: 10/15/2021 09:18   DG C-Arm 1-60 Min-No Report  Result Date: 10/15/2021 Fluoroscopy was utilized by the requesting physician.  No radiographic interpretation.   DG C-Arm 1-60 Min-No Report  Result Date: 10/15/2021 Fluoroscopy was utilized by the requesting physician.  No radiographic interpretation.    Disposition: Discharge disposition: 01-Home or Self Care            Signed: [name] ,PA-C 10/16/2021, 6:32 AM

## 2021-10-16 NOTE — Progress Notes (Signed)
  Subjective:  Patient reports pain as mild.    Objective:   VITALS:   Vitals:   10/15/21 1649 10/15/21 2017 10/15/21 2054 10/16/21 0449  BP: (!) 131/58 (!) 132/51  (!) 133/53  Pulse: 66 71 72 (!) 59  Resp: $Remo'16 18  17  'QubON$ Temp: 97.6 F (36.4 C) 98.2 F (36.8 C)  98.1 F (36.7 C)  TempSrc:      SpO2: 94% (!) 89% 93% 95%  Weight:        PHYSICAL EXAM:  Neurologically intact ABD soft Neurovascular intact Sensation intact distally Intact pulses distally Dorsiflexion/Plantar flexion intact Incision: no drainage No cellulitis present Compartment soft  LABS  Results for orders placed or performed during the hospital encounter of 10/15/21 (from the past 24 hour(s))  Glucose, capillary     Status: Abnormal   Collection Time: 10/15/21  9:46 AM  Result Value Ref Range   Glucose-Capillary 185 (H) 70 - 99 mg/dL    DG HIP UNILAT WITH PELVIS 1V LEFT  Result Date: 10/15/2021 CLINICAL DATA:  Left hip replacement EXAM: DG HIP (WITH OR WITHOUT PELVIS) 1V*L* COMPARISON:  None Available. FINDINGS: Fluoroscopic images were obtained intraoperatively and submitted for post operative interpretation. Left total hip arthroplasty with hardware in expected position, 2 images were obtained with 9 seconds of fluoroscopy time and 1.23 mGy. Please see the performing provider's procedural report for further detail. IMPRESSION: Fluoroscopic images demonstrate left total hip replacement. Electronically Signed   By: Yetta Glassman M.D.   On: 10/15/2021 09:18   DG C-Arm 1-60 Min-No Report  Result Date: 10/15/2021 Fluoroscopy was utilized by the requesting physician.  No radiographic interpretation.   DG C-Arm 1-60 Min-No Report  Result Date: 10/15/2021 Fluoroscopy was utilized by the requesting physician.  No radiographic interpretation.    Assessment/Plan: 1 Day Post-Op   Principal Problem:   History of total hip replacement, left   Advance diet Up with therapy Discharge home today if PT  goals met   Carlynn Spry , PA-C 10/16/2021, 6:29 AM

## 2021-10-17 LAB — SURGICAL PATHOLOGY

## 2021-10-18 DIAGNOSIS — N189 Chronic kidney disease, unspecified: Secondary | ICD-10-CM | POA: Diagnosis not present

## 2021-10-18 DIAGNOSIS — J439 Emphysema, unspecified: Secondary | ICD-10-CM | POA: Diagnosis not present

## 2021-10-18 DIAGNOSIS — E1122 Type 2 diabetes mellitus with diabetic chronic kidney disease: Secondary | ICD-10-CM | POA: Diagnosis not present

## 2021-10-18 DIAGNOSIS — Z87891 Personal history of nicotine dependence: Secondary | ICD-10-CM | POA: Diagnosis not present

## 2021-10-18 DIAGNOSIS — K219 Gastro-esophageal reflux disease without esophagitis: Secondary | ICD-10-CM | POA: Diagnosis not present

## 2021-10-18 DIAGNOSIS — I129 Hypertensive chronic kidney disease with stage 1 through stage 4 chronic kidney disease, or unspecified chronic kidney disease: Secondary | ICD-10-CM | POA: Diagnosis not present

## 2021-10-18 DIAGNOSIS — E7800 Pure hypercholesterolemia, unspecified: Secondary | ICD-10-CM | POA: Diagnosis not present

## 2021-10-18 DIAGNOSIS — M1990 Unspecified osteoarthritis, unspecified site: Secondary | ICD-10-CM | POA: Diagnosis not present

## 2021-10-18 DIAGNOSIS — J309 Allergic rhinitis, unspecified: Secondary | ICD-10-CM | POA: Diagnosis not present

## 2021-10-18 DIAGNOSIS — Z96652 Presence of left artificial knee joint: Secondary | ICD-10-CM | POA: Diagnosis not present

## 2021-10-18 DIAGNOSIS — Z96642 Presence of left artificial hip joint: Secondary | ICD-10-CM | POA: Diagnosis not present

## 2021-10-18 DIAGNOSIS — Z471 Aftercare following joint replacement surgery: Secondary | ICD-10-CM | POA: Diagnosis not present

## 2021-10-18 DIAGNOSIS — Z9181 History of falling: Secondary | ICD-10-CM | POA: Diagnosis not present

## 2021-10-18 DIAGNOSIS — I491 Atrial premature depolarization: Secondary | ICD-10-CM | POA: Diagnosis not present

## 2021-10-18 DIAGNOSIS — Z7984 Long term (current) use of oral hypoglycemic drugs: Secondary | ICD-10-CM | POA: Diagnosis not present

## 2021-10-18 DIAGNOSIS — L409 Psoriasis, unspecified: Secondary | ICD-10-CM | POA: Diagnosis not present

## 2021-10-28 ENCOUNTER — Telehealth: Payer: Self-pay

## 2021-10-28 NOTE — Progress Notes (Signed)
    Chronic Care Management Pharmacy Assistant   Name: NATALIYA GRAIG  MRN: [id number] DOB: 14-May-1945  Patient called to be reminded of her telephone appointment with Angelena Sole, CPP on 10/29/2021 @ 0830.  Patient aware of appointment date, time, and type of appointment (either telephone or in person). Patient aware to have/bring all medications, supplements, blood pressure and/or blood sugar logs to visit.  Star Rating Drug: Enalapril 20 mg last filled on 09/26/2021 for a 90-Day supply with [facility] Glimepiride 4 mg last filled on 08/06/2021 for a 90-Day supply with [facility] Pravastatin 80 mg last filled on 09/26/2021 for a 90-Day supply with [facility]  Any gaps in medications fill history? No  Care Gaps: COVID-19 Vaccine Booster 3    Adelene Idler, CPA/CMA Clinical Pharmacist Assistant Phone: [phone]

## 2021-10-29 ENCOUNTER — Ambulatory Visit (INDEPENDENT_AMBULATORY_CARE_PROVIDER_SITE_OTHER): Payer: Medicare Other

## 2021-10-29 DIAGNOSIS — E119 Type 2 diabetes mellitus without complications: Secondary | ICD-10-CM

## 2021-10-29 DIAGNOSIS — J439 Emphysema, unspecified: Secondary | ICD-10-CM

## 2021-10-29 NOTE — Patient Instructions (Signed)
Visit Information It was great speaking with you today!  Please let me know if you have any questions about our visit.   Goals Addressed             This Visit's Progress    Monitor and Manage My Blood Sugar-Diabetes Type 2   On track    Timeframe:  Long-Range Goal Priority:  High Start Date: 05/20/2021                             Expected End Date: 05/20/2022                      Follow Up within 90 days   - check blood sugar once daily before breakfast - check blood sugar if I feel it is too high or too low - take the blood sugar log to all doctor visits    Why is this important?   Checking your blood sugar at home helps to keep it from getting very high or very low.  Writing the results in a diary or log helps the doctor know how to care for you.  Your blood sugar log should have the time, date and the results.  Also, write down the amount of insulin or other medicine that you take.  Other information, like what you ate, exercise done and how you were feeling, will also be helpful.     Notes:         Patient Care Plan: General Pharmacy (Adult)     Problem Identified: Hypertension, Hyperlipidemia, Diabetes, GERD, Chronic Kidney Disease, and Osteoarthritis   Priority: High     Long-Range Goal: Patient-Specific Goal   Start Date: 05/20/2021  Expected End Date: 05/20/2022  This Visit's Progress: On track  Recent Progress: On track  Priority: High  Note:   Current Barriers:  Suboptimal control of COPD   Pharmacist Clinical Goal(s):  Patient will maintain control of blood pressure as evidenced by BP less than 140/90  through collaboration with PharmD and provider.   Interventions: 1:1 collaboration with Danelle Berry, PA-C regarding development and update of comprehensive plan of care as evidenced by provider attestation and co-signature Inter-disciplinary care team collaboration (see longitudinal plan of care) Comprehensive medication review performed; medication  list updated in electronic medical record  Hypertension (BP goal <140/90) -Controlled -Current treatment: Carvedilol 6.25 mg twice daily: Appropriate, Effective, Safe, Accessible Enalapril 20 mg twice daily: Appropriate, Effective, Safe, Accessible  HCTZ 25 mg daily: Appropriate, Effective, Safe, Accessible  -Medications previously tried: NA  -History of tachycardia  -Current home readings: 120s/70s  -Denies hypotensive/hypertensive symptoms -Recommended to continue current medication  Hyperlipidemia: (LDL goal < 70) -Uncontrolled -Current treatment: Pravastatin 80 mg daily: Appropriate, Effective, Safe, Accessible  -Current treatment: Aspirin 81 mg twice daily: Appropriate, Effective, Safe, Accessible  -Medications previously tried: NA  -Recommended to continue current medication  Diabetes (A1c goal <7%) -Controlled -Current medications: Glimepiride 4 mg daily: Appropriate, Effective, Safe, Accessible  -Medications previously tried: Actos, Metformin (headaches)   -Current home glucose readings fasting glucose: 116,  -Denies hypoglycemic/hyperglycemic symptoms -Recommended to continue current medication  GERD (Goal: Prevent heartburn) -Controlled -Current treatment  Pantoprazole 20 mg daily: Appropriate, Effective, Safe, Accessible  -Medications previously tried: NA  -Hasn't had symptoms, could consider taper.  -Recommended to continue current medication  COPD (Goal: control symptoms and prevent exacerbations) -Not ideally controlled -O2 dependent at night.  -Current treatment  Albuterol HFA: Appropriate,  Effective, Safe, Accessible  -Medications previously tried: NA  -Gold Grade: Gold 2 (FEV1 50-79%), Group B likely  -MMRC/CAT score: 2 -Pulmonary function testing: FEV 50% predicted (2016) -Exacerbations requiring treatment in last 6 months: Feb 2023, requiring abx and PO steroids  -Discussed at length potential benefits of long-acting inhalers, but patient does not  feel like she would benefit at this time. Given blood eos < 300, would recommend LABA + LAMA combination for patient. She is very concerned about the cost of a potential inhaler. -Patient to discuss following with pulmonology:  -Recommend Bevespi 2 puffs twice daily. Will apply for PAP if necessary.  -Recommend PCV20  -Recommend PFTs at pulmonology follow-up.    Psoriatic Arthritis  (Goal: Minimize pain symptoms) -Controlled -managed by Dr. Cheree Ditto -Current treatment  Acetaminophen 500 mg 2-5 tablets twice daily as needed: Appropriate, Effective, Safe, Accessible Apremilast 30 mg twice daily: Appropriate, Effective, Safe, Accessible -Medications previously tried:   -Hasn't needed tylenol recently. -Recommended to continue current medication  Chronic Kidney Disease Stage 3a  -All medications assessed for renal dosing and appropriateness in chronic kidney disease. -Recommended to continue current medication  Patient Goals/Self-Care Activities Patient will:  - check glucose daily before breakfast, document, and provide at future appointments  Follow Up Plan: Telephone follow up appointment with care management team member scheduled for:  02/04/2022 at 8:30 AM      Patient agreed to services and verbal consent obtained.   Patient verbalizes understanding of instructions and care plan provided today and agrees to view in MyChart. Active MyChart status and patient understanding of how to access instructions and care plan via MyChart confirmed with patient.     Cheyenne Adas, CPP Clinical Pharmacist Practitioner  [facility] [phone]

## 2021-10-29 NOTE — Progress Notes (Signed)
 Chronic Care Management Pharmacy Note  10/29/2021 Name:  Julie Keith MRN:  [id number] DOB:  01/31/1946  Summary: Patient presents for CCM follow-up.   -Discussed at length potential benefits of long-acting inhalers, but patient does not feel like she would benefit at this time. Given blood eos < 300, would recommend LABA + LAMA combination for patient. She is very concerned about the cost of a potential inhaler.  Recommendations/Changes made from today's visit: Continue current medications -Patient to discuss following with pulmonology:  -Recommend Bevespi 2 puffs twice daily. Will apply for PAP if necessary.  -Recommend PCV20  -Recommend PFTs at pulmonology follow-up.    Plan: CPP follow-up 6 months  Subjective: Julie Keith is an 76 y.o. year old female who is a primary patient of Tapia, Leisa, PA-C.  The CCM team was consulted for assistance with disease management and care coordination needs.    Engaged with patient by telephone for follow up visit in response to provider referral for pharmacy case management and/or care coordination services.   Consent to Services:  The patient was given information about Chronic Care Management services, agreed to services, and gave verbal consent prior to initiation of services.  Please see initial visit note for detailed documentation.   Patient Care Team: Tapia, Leisa, PA-C as PCP - General (Family Medicine) Arida, Muhammad A, MD as PCP - Cardiology (Cardiology) Benitez-Graham, Ana, MD (Dermatology) Ramachandran, Pradeep, MD as Consulting Physician (Pulmonary Disease) Arida, Muhammad A, MD as Consulting Physician (Cardiology) Fleury, Alexandre A, RPH (Pharmacist)  Recent office visits: 08/22/21: Patient presented to Leisa Tapia, PA-C for follow-up. 7.5%. eGFR 35   Recent consult visits: 09/16/21: Patient presented to Dr. Kasa (Pulmonology) for follow-up. Trelegy.   Hospital visits: None in previous 6 months  Recommended  Problem List Changes:   Modify:  Type 2 diabetes mellitus with stage 3a chronic kidney disease, without long-term current use of insulin (HCC)   Objective:  Lab Results  Component Value Date   CREATININE 1.50 (H) 10/15/2021   BUN 28 (H) 10/15/2021   GFRNONAA 47 (L) 10/09/2021   GFRAA 55 (L) 04/15/2020   NA 140 10/15/2021   K 4.2 10/15/2021   CALCIUM 9.4 10/09/2021   CO2 31 10/09/2021   GLUCOSE 119 (H) 10/15/2021    Lab Results  Component Value Date/Time   HGBA1C 7.5 (H) 08/22/2021 08:38 AM   HGBA1C 7.0 (H) 03/17/2021 09:40 AM   HGBA1C 7.5 01/10/2016 12:00 AM   MICROALBUR 2.1 04/15/2020 11:16 AM   MICROALBUR 2.4 12/27/2018 12:00 AM   MICROALBUR 100 03/05/2015 07:47 AM    Last diabetic Eye exam:  Lab Results  Component Value Date/Time   HMDIABEYEEXA No Retinopathy 01/14/2021 12:00 AM    Last diabetic Foot exam: No results found for: "HMDIABFOOTEX"   Lab Results  Component Value Date   CHOL 152 11/08/2020   HDL 58 11/08/2020   LDLCALC 75 11/08/2020   TRIG 107 11/08/2020   CHOLHDL 2.6 11/08/2020       Latest Ref Rng & Units 08/22/2021    8:38 AM 03/17/2021    9:40 AM 11/08/2020   10:25 AM  Hepatic Function  Total Protein 6.1 - 8.1 g/dL 7.1  7.4  7.4   AST 10 - 35 U/L 13  12  11   ALT 6 - 29 U/L 9  8  4   Total Bilirubin 0.2 - 1.2 mg/dL 0.4  0.5  0.5     Lab Results  Component Value Date/Time     TSH 1.63 11/08/2020 10:25 AM   TSH 1.45 07/29/2016 08:42 AM       Latest Ref Rng & Units 10/15/2021    6:20 AM 10/09/2021   10:30 AM 08/22/2021    8:38 AM  CBC  WBC 4.0 - 10.5 K/uL  9.6  8.8   Hemoglobin 12.0 - 15.0 g/dL 13.9  12.6  13.1   Hematocrit 36.0 - 46.0 % 41.0  41.3  41.1   Platelets 150 - 400 K/uL  352  381     No results found for: "VD25OH"  Clinical ASCVD: No  The 10-year ASCVD risk score (Arnett DK, et al., 2019) is: 42.2%   Values used to calculate the score:     Age: 76 years     Sex: Female     Is Non-Hispanic African American: No      Diabetic: Yes     Tobacco smoker: No     Systolic Blood Pressure: 125 mmHg     Is BP treated: Yes     HDL Cholesterol: 58 mg/dL     Total Cholesterol: 152 mg/dL       08/22/2021    8:28 AM 05/30/2021    1:58 PM 03/11/2021    8:47 AM  Depression screen PHQ 2/9  Decreased Interest 0 0 0  Down, Depressed, Hopeless 0 0 0  PHQ - 2 Score 0 0 0  Altered sleeping 0  0  Tired, decreased energy 0  0  Change in appetite 0  0  Feeling bad or failure about yourself  0  0  Trouble concentrating 0  0  Moving slowly or fidgety/restless 0  0  Suicidal thoughts 0  0  PHQ-9 Score 0  0  Difficult doing work/chores Not difficult at all  Not difficult at all    Social History   Tobacco Use  Smoking Status Former   Packs/day: 2.00   Years: 30.00   Total pack years: 60.00   Types: Cigarettes   Quit date: 1990   Years since quitting: 33.5  Smokeless Tobacco Never   BP Readings from Last 3 Encounters:  10/16/21 (!) 125/49  10/09/21 (!) 143/55  09/16/21 120/60   Pulse Readings from Last 3 Encounters:  10/16/21 (!) 59  10/09/21 62  09/16/21 61   Wt Readings from Last 3 Encounters:  10/15/21 211 lb 3.2 oz (95.8 kg)  10/09/21 211 lb 3.2 oz (95.8 kg)  09/16/21 213 lb 9.6 oz (96.9 kg)   BMI Readings from Last 3 Encounters:  10/15/21 35.15 kg/m  10/09/21 35.15 kg/m  09/16/21 40.36 kg/m    Assessment/Interventions: Review of patient past medical history, allergies, medications, health status, including review of consultants reports, laboratory and other test data, was performed as part of comprehensive evaluation and provision of chronic care management services.   SDOH:  (Social Determinants of Health) assessments and interventions performed: Yes   SDOH Screenings   Alcohol Screen: Low Risk  (05/30/2021)   Alcohol Screen    Last Alcohol Screening Score (AUDIT): 0  Depression (PHQ2-9): Low Risk  (08/22/2021)   Depression (PHQ2-9)    PHQ-2 Score: 0  Financial Resource Strain: Low  Risk  (05/20/2021)   Overall Financial Resource Strain (CARDIA)    Difficulty of Paying Living Expenses: Not hard at all  Food Insecurity: Not on file  Housing: Not on file  Physical Activity: Not on file  Social Connections: Not on file  Stress: Not on file  Tobacco Use: Medium Risk (10/15/2021)     Patient History    Smoking Tobacco Use: Former    Smokeless Tobacco Use: Never    Passive Exposure: Not on file  Transportation Needs: Not on file    [place]  Allergies  Allergen Reactions   Amlodipine     Lower extremity swelling   Metformin And Related     headaches    Medications Reviewed Today     Reviewed by Doreen Beam, RN (Registered Nurse) on 10/15/21 at [phone]  Med List Status: <None>   Medication Order Taking? Sig Documenting Provider Last Dose Status Informant  acetaminophen (TYLENOL) 500 MG tablet [id number] Yes Take 1,000 mg by mouth every 8 (eight) hours as needed for moderate pain. [provider] Past Month Active Self  Apremilast (OTEZLA) 30 MG TABS [id number] Yes Take 30 mg by mouth in the morning and at bedtime. [provider] 10/14/2021 Active   aspirin 81 MG chewable tablet [id number] Yes Chew 1 tablet (81 mg total) by mouth 2 (two) times daily. Carlynn Spry, PA-C Past Week Active Self  carvedilol (COREG) 6.25 MG tablet [id number] Yes Take 1 tablet (6.25 mg total) by mouth 2 (two) times daily. Theora Gianotti, NP 10/15/2021 Active   cetirizine (ZYRTEC) 10 MG tablet [id number] Yes Take 1 tablet (10 mg total) by mouth daily. Bo Merino, FNP 10/14/2021 Active   enalapril (VASOTEC) 20 MG tablet [id number] Yes Take 1 tablet (20 mg total) by mouth 2 (two) times daily. Delsa Grana, PA-C 10/14/2021 Active   fluticasone (FLONASE) 50 MCG/ACT nasal spray [id number] No Place 2 sprays into both nostrils daily.  Patient not taking: Reported on 10/15/2021   Bo Merino, FNP Not Taking Active Self  glimepiride (AMARYL) 4 MG tablet [id number]  Yes Take 1 tablet by mouth once daily with breakfast Bo Merino, FNP 10/14/2021 Active   glucose blood (ONETOUCH ULTRA) test strip [id number] Yes USE 1 STRIP TO CHECK GLUCOSE IN THE MORNING, AT NOON, AND AT BEDTIME. Bo Merino, FNP 10/15/2021 Active   hydrochlorothiazide (HYDRODIURIL) 25 MG tablet [id number] Yes Take 1 tablet (25 mg total) by mouth daily. Delsa Grana, PA-C 10/14/2021 Active   pantoprazole (PROTONIX) 20 MG tablet [id number] Yes Take 1 tablet (20 mg total) by mouth daily. Delsa Grana, PA-C 10/15/2021 Active   pravastatin (PRAVACHOL) 80 MG tablet [id number] Yes Take 1 tablet (80 mg total) by mouth daily. Delsa Grana, PA-C 10/14/2021 Active   Tiotropium Bromide Monohydrate (SPIRIVA RESPIMAT) 2.5 MCG/ACT AERS [id number] Yes Inhale 2 each into the lungs as needed. [provider] 10/15/2021 Active Self            Patient Active Problem List   Diagnosis Date Noted   History of total hip replacement, left 10/15/2021   Leukocytosis 08/22/2021   Immunosuppression due to drug therapy ([place]) 03/11/2021   History of total knee replacement, left 06/17/2020   Osteoarthritis of knee 03/09/2020   Osteoarthritis of left knee 02/28/2020   Stage 3 chronic kidney disease ([place]) 09/21/2019   Thrombocytosis 04/07/2017   Psoriasis 10/10/2015   GERD (gastroesophageal reflux disease) 10/10/2015   Type 2 diabetes mellitus without complication, without long-term current use of insulin ([place]) 10/31/2014   Essential hypertension 10/31/2014   Hyperlipidemia 10/31/2014   Class 3 severe obesity with serious comorbidity and body mass index (BMI) of 40.0 to 44.9 in adult [facility]) 10/31/2014   Pulmonary emphysema ([place]) 06/28/2014   Chronic respiratory failure ([place]) 06/28/2014    Immunization History  Administered Date(s) Administered   Fluad  Quad(high Dose 65+) 12/27/2018, 01/23/2020, 12/26/2020   Influenza, High Dose Seasonal PF 02/14/2015, 01/27/2016, 01/18/2017, 01/26/2018    Influenza-Unspecified 01/31/2014   PFIZER(Purple Top)SARS-COV-2 Vaccination 03/21/2020, 04/10/2020   Pneumococcal Conjugate-13 02/19/2014   Pneumococcal Polysaccharide-23 04/01/2010   Zoster, Live 10/14/2010    Conditions to be addressed/monitored:  Hypertension, Hyperlipidemia, Diabetes, GERD, Chronic Kidney Disease, and Osteoarthritis  Care Plan : General Pharmacy (Adult)  Updates made by Fleury, Alexandre A, RPH since 10/29/2021 12:00 AM     Problem: Hypertension, Hyperlipidemia, Diabetes, GERD, Chronic Kidney Disease, and Osteoarthritis   Priority: High     Long-Range Goal: Patient-Specific Goal   Start Date: 05/20/2021  Expected End Date: 05/20/2022  This Visit's Progress: On track  Recent Progress: On track  Priority: High  Note:   Current Barriers:  Suboptimal control of COPD   Pharmacist Clinical Goal(s):  Patient will maintain control of blood pressure as evidenced by BP less than 140/90  through collaboration with PharmD and provider.   Interventions: 1:1 collaboration with Tapia, Leisa, PA-C regarding development and update of comprehensive plan of care as evidenced by provider attestation and co-signature Inter-disciplinary care team collaboration (see longitudinal plan of care) Comprehensive medication review performed; medication list updated in electronic medical record  Hypertension (BP goal <140/90) -Controlled -Current treatment: Carvedilol 6.25 mg twice daily: Appropriate, Effective, Safe, Accessible Enalapril 20 mg twice daily: Appropriate, Effective, Safe, Accessible  HCTZ 25 mg daily: Appropriate, Effective, Safe, Accessible  -Medications previously tried: NA  -History of tachycardia  -Current home readings: 120s/70s  -Denies hypotensive/hypertensive symptoms -Recommended to continue current medication  Hyperlipidemia: (LDL goal < 70) -Uncontrolled -Current treatment: Pravastatin 80 mg daily: Appropriate, Effective, Safe, Accessible  -Current  treatment: Aspirin 81 mg twice daily: Appropriate, Effective, Safe, Accessible  -Medications previously tried: NA  -Recommended to continue current medication  Diabetes (A1c goal <7%) -Controlled -Current medications: Glimepiride 4 mg daily: Appropriate, Effective, Safe, Accessible  -Medications previously tried: Actos, Metformin (headaches)   -Current home glucose readings fasting glucose: 116,  -Denies hypoglycemic/hyperglycemic symptoms -Recommended to continue current medication  GERD (Goal: Prevent heartburn) -Controlled -Current treatment  Pantoprazole 20 mg daily: Appropriate, Effective, Safe, Accessible  -Medications previously tried: NA  -Hasn't had symptoms, could consider taper.  -Recommended to continue current medication  COPD (Goal: control symptoms and prevent exacerbations) -Not ideally controlled -O2 dependent at night.  -Current treatment  Albuterol HFA: Appropriate, Effective, Safe, Accessible  -Medications previously tried: NA  -Gold Grade: Gold 2 (FEV1 50-79%), Group B likely  -MMRC/CAT score: 2 -Pulmonary function testing: FEV 50% predicted (2016) -Exacerbations requiring treatment in last 6 months: Feb 2023, requiring abx and PO steroids  -Discussed at length potential benefits of long-acting inhalers, but patient does not feel like she would benefit at this time. Given blood eos < 300, would recommend LABA + LAMA combination for patient. She is very concerned about the cost of a potential inhaler. -Patient to discuss following with pulmonology:  -Recommend Bevespi 2 puffs twice daily. Will apply for PAP if necessary.  -Recommend PCV20  -Recommend PFTs at pulmonology follow-up.    Psoriatic Arthritis  (Goal: Minimize pain symptoms) -Controlled -managed by Dr. Graham -Current treatment  Acetaminophen 500 mg 2-5 tablets twice daily as needed: Appropriate, Effective, Safe, Accessible Apremilast 30 mg twice daily: Appropriate, Effective, Safe,  Accessible -Medications previously tried:   -Hasn't needed tylenol recently. -Recommended to continue current medication  Chronic Kidney Disease Stage 3a  -All medications assessed for renal dosing and appropriateness in chronic kidney disease. -  Recommended to continue current medication  Patient Goals/Self-Care Activities Patient will:  - check glucose daily before breakfast, document, and provide at future appointments  Follow Up Plan: Telephone follow up appointment with care management team member scheduled for:  02/04/2022 at 8:30 AM       Medication Assistance: None required.  Patient affirms current coverage meets needs.  Compliance/Adherence/Medication fill history: Care Gaps: Tdap Covid booster  Star-Rating Drugs: Enalapril 20 mg: Last filled 09/26/21 for 90-DS Glimepiride 4 mg: Last filled 08/06/21 for 90-DS Pravastatin 80 mg: Last filled 09/26/21 for 90-DS  Patient's preferred pharmacy is:  [id number] - [place], [place] - [address] [place] [address] [place] [address] [place]  [address] Phone: [phone] Fax: [phone]  Uses pill box? Yes Pt endorses 100% compliance  We discussed: Current pharmacy is preferred with insurance plan and patient is satisfied with pharmacy services Patient decided to: Continue current medication management strategy  Care Plan and Follow Up Patient Decision:  Patient agrees to Care Plan and Follow-up.  Plan: Telephone follow up appointment with care management team member scheduled for:  02/04/2022 at 8:30 AM  Alex Fleury,PharmD, BCACP, CPP Clinical Pharmacist Practitioner  [facility] [phone] 

## 2021-11-05 NOTE — H&P (Signed)
NAME: Julie Keith MRN:   [id number] DOB:   09/29/1945     HISTORY AND PHYSICAL      HISTORY AND PHYSICAL   CHIEF COMPLAINT:  left hip pain   HISTORY:   Julie Klemmer Blalockis a 76 y.o. female  with left  Hip Pain Patient complains of left hip pain. Onset of the symptoms was several years ago. Inciting event: known DJD. The patient reports the hip pain is worse with weight bearing. Associated symptoms: none. Aggravating symptoms include: any weight bearing. Patient has had prior hip problems. Previous visits for this problem: multiple, this is a longstanding diagnosis. Last seen several weeks ago by me. Evaluation to date: plain films, which were abnormal  osteoarthritis . Treatment to date: OTC analgesics, which have been somewhat effective, prescription analgesics, which have been somewhat effective, and physical therapy, which has been somewhat effective.   Plan for left total hip replacement   PAST MEDICAL HISTORY:       Past Medical History:  Diagnosis Date   Allergic rhinitis     Diabetes (HCC)     Dyspnea     Emphysema lung (HCC)     GERD (gastroesophageal reflux disease) 10/10/2015   History of echocardiogram      a. 12/2020 Echo: EF 60-65%, no rwma, Nl RV size/fxn, mildly dil LA. No significant valvular dzs.   Hx of tobacco use, presenting hazards to health 04/08/2016   Hypercholesteremia     Hypertension     Palpitations      a. 07/2016 Zio: RSR, avg HR 67 (50-126), 2 SVT episodes, likely A tach - fastest 12 beats @ 154. 40,027 isolated PACs (13.5% burden).   Premature atrial contractions     Psoriasis 10/10/2015      PAST SURGICAL HISTORY:        Past Surgical History:  Procedure Laterality Date   ABDOMINAL HYSTERECTOMY       BACK SURGERY       CERVICAL SPINE SURGERY       CHOLECYSTECTOMY       KNEE CLOSED REDUCTION Left 10/14/2020    Procedure: CLOSED MANIPULATION KNEE UNDER ANESTHESIA;  Surgeon: Lyndle Herrlich, MD;  Location: [facility] ORS;  Service: Orthopedics;   Laterality: Left;   MASTECTOMY Bilateral     SPINAL CORD STIMULATOR BATTERY EXCHANGE        Osteostimulator in back   TOTAL KNEE ARTHROPLASTY Left 06/17/2020    Procedure: TOTAL KNEE ARTHROPLASTY;  Surgeon: Lyndle Herrlich, MD;  Location: [facility] ORS;  Service: Orthopedics;  Laterality: Left;      MEDICATIONS:  (Not in a hospital admission)     ALLERGIES:        Allergies  Allergen Reactions   Amlodipine        Lower extremity swelling   Metformin And Related        headaches      REVIEW OF SYSTEMS:   Negative except HPI   FAMILY HISTORY:        Family History  Problem Relation Age of Onset   Leukemia Mother     Pneumonia Father     COPD Father     Diabetes Brother     Cancer Brother          lung cancer   Diabetes Sister        SOCIAL HISTORY:   reports that she quit smoking about 33 years ago. Her smoking use included cigarettes. She has a 60.00 pack-year smoking history.  She has never used smokeless tobacco. She reports that she does not drink alcohol and does not use drugs.   PHYSICAL EXAM:  General appearance: alert, cooperative, and no distress Neck: no JVD and supple, symmetrical, trachea midline Resp: clear to auscultation bilaterally Cardio: regular rate and rhythm, S1, S2 normal, no murmur, click, rub or gallop GI: soft, non-tender; bowel sounds normal; no masses,  no organomegaly Extremities: extremities normal, atraumatic, no cyanosis or edema and Homans sign is negative, no sign of DVT Pulses: 2+ and symmetric Skin: Skin color, texture, turgor normal. No rashes or lesions      LABORATORY STUDIES: Recent Labs (last 2 labs)   No results for input(s): "WBC", "HGB", "HCT", "PLT" in the last 72 hours.                Recent Labs (last 2 labs)   No results for input(s): "NA", "K", "CL", "CO2", "GLUCOSE", "BUN", "CREATININE", "CALCIUM" in the last 72 hours.     STUDIES/RESULTS:              Imaging Results  No results found.     ASSESSMENT:  End stage  osteoarthritis left hip                              Active Problems:   * No active hospital problems. *     PLAN:  Left Primary Total Hip    [name] 11/05/2021. 11:29 AM

## 2021-11-07 LAB — HM DIABETES FOOT EXAM: HM Diabetic Foot Exam: NORMAL

## 2021-11-11 DIAGNOSIS — B372 Candidiasis of skin and nail: Secondary | ICD-10-CM | POA: Insufficient documentation

## 2021-11-13 ENCOUNTER — Ambulatory Visit
Admission: RE | Admit: 2021-11-13 | Discharge: 2021-11-13 | Disposition: A | Discharge status: Home / Self Care | Payer: Medicare Other | Source: Ambulatory Visit | Attending: Internal Medicine | Admitting: Internal Medicine

## 2021-11-13 ENCOUNTER — Ambulatory Visit: Payer: Medicare Other | Admitting: Internal Medicine

## 2021-11-13 ENCOUNTER — Ambulatory Visit
Admission: RE | Admit: 2021-11-13 | Discharge: 2021-11-13 | Disposition: A | Discharge status: Home / Self Care | Payer: Medicare Other | Attending: Internal Medicine | Admitting: Internal Medicine

## 2021-11-13 VITALS — BP 124/52 | HR 47 | Temp 98.3°F | Resp 16 | Ht 65.0 in | Wt 209.4 lb

## 2021-11-13 DIAGNOSIS — J449 Chronic obstructive pulmonary disease, unspecified: Secondary | ICD-10-CM | POA: Insufficient documentation

## 2021-11-13 DIAGNOSIS — K219 Gastro-esophageal reflux disease without esophagitis: Secondary | ICD-10-CM | POA: Diagnosis not present

## 2021-11-13 DIAGNOSIS — J9611 Chronic respiratory failure with hypoxia: Secondary | ICD-10-CM | POA: Diagnosis not present

## 2021-11-13 DIAGNOSIS — R0602 Shortness of breath: Secondary | ICD-10-CM

## 2021-11-13 MED ORDER — TRELEGY ELLIPTA 100-62.5-25 MCG/ACT IN AEPB: 1.0000 | INHALATION_SPRAY | Freq: Every day | RESPIRATORY_TRACT | 11 refills | Status: DC

## 2021-11-13 MED ORDER — ALBUTEROL SULFATE HFA 108 (90 BASE) MCG/ACT IN AERS: 2.0000 | INHALATION_SPRAY | Freq: Four times a day (QID) | RESPIRATORY_TRACT | 0 refills | Status: DC | PRN

## 2021-11-13 NOTE — Progress Notes (Signed)
6 min walk completed on patient and resulted in pt having to be placed on 2 liters of oxygen 24 hours a day.  Sent community message to Maralyn Sago with [employer] [facility] that order for oxygen in Epic and results for 6 min walk with and without oxygen.  Pt advised that she had only been using oxygen at night when sleeping.  Pt uses [employer] at the moment but want to switch companies.  Sent to [facility].  Pt given samples of Trelegy inhaler  100-62.5-25
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
## 2021-11-14 ENCOUNTER — Telehealth: Payer: Self-pay

## 2021-11-14 NOTE — Telephone Encounter (Signed)
Faxed oxygen order to [facility]ah [phone]

## 2021-11-17 DIAGNOSIS — Z96652 Presence of left artificial knee joint: Secondary | ICD-10-CM | POA: Diagnosis not present

## 2021-11-17 DIAGNOSIS — Z471 Aftercare following joint replacement surgery: Secondary | ICD-10-CM | POA: Diagnosis not present

## 2021-11-17 DIAGNOSIS — J439 Emphysema, unspecified: Secondary | ICD-10-CM

## 2021-11-17 DIAGNOSIS — E1122 Type 2 diabetes mellitus with diabetic chronic kidney disease: Secondary | ICD-10-CM | POA: Diagnosis not present

## 2021-11-17 DIAGNOSIS — Z87891 Personal history of nicotine dependence: Secondary | ICD-10-CM | POA: Diagnosis not present

## 2021-11-17 DIAGNOSIS — Z7984 Long term (current) use of oral hypoglycemic drugs: Secondary | ICD-10-CM | POA: Diagnosis not present

## 2021-11-17 DIAGNOSIS — Z96642 Presence of left artificial hip joint: Secondary | ICD-10-CM | POA: Diagnosis not present

## 2021-11-17 DIAGNOSIS — M1990 Unspecified osteoarthritis, unspecified site: Secondary | ICD-10-CM | POA: Diagnosis not present

## 2021-11-17 DIAGNOSIS — I491 Atrial premature depolarization: Secondary | ICD-10-CM | POA: Diagnosis not present

## 2021-11-17 DIAGNOSIS — E7800 Pure hypercholesterolemia, unspecified: Secondary | ICD-10-CM | POA: Diagnosis not present

## 2021-11-17 DIAGNOSIS — I129 Hypertensive chronic kidney disease with stage 1 through stage 4 chronic kidney disease, or unspecified chronic kidney disease: Secondary | ICD-10-CM | POA: Diagnosis not present

## 2021-11-17 DIAGNOSIS — E119 Type 2 diabetes mellitus without complications: Secondary | ICD-10-CM

## 2021-11-17 DIAGNOSIS — N189 Chronic kidney disease, unspecified: Secondary | ICD-10-CM | POA: Diagnosis not present

## 2021-11-17 DIAGNOSIS — J309 Allergic rhinitis, unspecified: Secondary | ICD-10-CM | POA: Diagnosis not present

## 2021-11-17 DIAGNOSIS — L409 Psoriasis, unspecified: Secondary | ICD-10-CM | POA: Diagnosis not present

## 2021-11-17 DIAGNOSIS — K219 Gastro-esophageal reflux disease without esophagitis: Secondary | ICD-10-CM | POA: Diagnosis not present

## 2021-11-17 DIAGNOSIS — Z9181 History of falling: Secondary | ICD-10-CM | POA: Diagnosis not present

## 2021-11-18 ENCOUNTER — Telehealth: Payer: Self-pay

## 2021-11-18 NOTE — Telephone Encounter (Signed)
Per Tresa Endo from [facility] we can't change companies for pt's oxygen due to her insurance will not give authorization bc pt is in the 2 year grace period and is not eligible til Jan 2025. I informed pt that I will send oxygen order to [facility].

## 2021-11-26 ENCOUNTER — Encounter: Payer: Self-pay | Admitting: Family Medicine

## 2021-11-26 ENCOUNTER — Ambulatory Visit: Payer: Medicare Other | Admitting: Internal Medicine

## 2021-11-27 ENCOUNTER — Telehealth: Payer: Self-pay

## 2021-11-27 DIAGNOSIS — E119 Type 2 diabetes mellitus without complications: Secondary | ICD-10-CM

## 2021-11-27 MED ORDER — GLIMEPIRIDE 4 MG PO TABS: 4.0000 mg | ORAL_TABLET | Freq: Every day | ORAL | 1 refills | Status: DC

## 2021-11-27 NOTE — Progress Notes (Signed)
Chronic Care Management Pharmacy Assistant   Name: Julie Keith  MRN: [id number] DOB: Sep 19, 1945  Reason for Encounter: Diabetes Disease State Call   Recent office visits:  None ID  Recent consult visits:  11/17/2021  [name], PA-C ([facility])- for wound check- No medication changes noted, XR order placed, Patient to follow-up in 2 weeks for reevaluation  11/13/2021 [name], MD (Pulmonology) for Initial Consult- Started: Fluticasone-Umeclidine-Vilant 100-62.5-25 mcg, Albuterol Sulfate 108 base 2 puffs q6h prn, Chest XR ordered, DME Oxygen ordered, Pulmonary Function Test ordered, and 6 minute walk ordered, Patient will be placed on 2L O2 24 hours daily, Patient given samples of Trelegy Inhaler 100-62.5-25, No follow-up noted  11/11/2021 [name], PA-C ([facility]) for Post Op- The patient will be given nystatin cream for probable candidiasis and is advised to keep the area clean and dry., No orders placed, Patient to follow-up on that following Monday for wound check  10/29/2021 [name], PA-C ([facility]) for Post-Op- No medication changes noted, No orders placed, patient to follow-up in 4 weeks.  Hospital visits:  None in previous 6 months  Medications: Outpatient Encounter Medications as of 11/27/2021  Medication Sig   acetaminophen (TYLENOL) 500 MG tablet Take 1,000 mg by mouth every 8 (eight) hours as needed for moderate pain.   albuterol (VENTOLIN HFA) 108 (90 Base) MCG/ACT inhaler Inhale 2 puffs into the lungs every 6 (six) hours as needed for wheezing or shortness of breath.   Apremilast (OTEZLA) 30 MG TABS Take 30 mg by mouth in the morning and at bedtime.   aspirin 81 MG chewable tablet Chew 1 tablet (81 mg total) by mouth 2 (two) times daily.   carvedilol (COREG) 6.25 MG tablet Take 1 tablet (6.25 mg total) by mouth 2 (two) times daily.   cetirizine (ZYRTEC) 10 MG tablet Take 1 tablet (10 mg total) by mouth daily.   enalapril (VASOTEC) 20 MG tablet  Take 1 tablet (20 mg total) by mouth 2 (two) times daily.   EQUATE STOOL SOFTENER 100 MG capsule Take 100 mg by mouth 2 (two) times daily.   fluticasone (FLONASE) 50 MCG/ACT nasal spray Place 2 sprays into both nostrils daily.   Fluticasone-Umeclidin-Vilant (TRELEGY ELLIPTA) 100-62.5-25 MCG/ACT AEPB Inhale 1 puff into the lungs daily.   glimepiride (AMARYL) 4 MG tablet Take 1 tablet by mouth once daily with breakfast   glucose blood (ONETOUCH ULTRA) test strip USE 1 STRIP TO CHECK GLUCOSE IN THE MORNING, AT NOON, AND AT BEDTIME.   hydrochlorothiazide (HYDRODIURIL) 25 MG tablet Take 1 tablet (25 mg total) by mouth daily.   nystatin cream (MYCOSTATIN) Apply topically.   OXYGEN Inhale 2 L into the lungs at bedtime. Uses APRIA at night   pantoprazole (PROTONIX) 20 MG tablet Take 1 tablet (20 mg total) by mouth daily.   pravastatin (PRAVACHOL) 80 MG tablet Take 1 tablet (80 mg total) by mouth daily.   No facility-administered encounter medications on file as of 11/27/2021.   Care Gaps: Zoster Vaccine COVID-19 Vaccine Booster 3 Influenza Vaccine   Star Rating Drugs: Enalapril 20 mg last filled on 09/26/2021 for a 90-Day supply with [facility] Glimepiride 4 mg last filled on 08/06/2021 for a 90-Day supply with [facility] Pravastatin 80 mg last filled on 09/26/2021 for a 90-Day supply with [facility]  Recent Relevant Labs: Lab Results  Component Value Date/Time   HGBA1C 7.5 (H) 08/22/2021 08:38 AM   HGBA1C 7.0 (H) 03/17/2021 09:40 AM   HGBA1C  7.5 01/10/2016 12:00 AM   MICROALBUR 2.1 04/15/2020 11:16 AM   MICROALBUR 2.4 12/27/2018 12:00 AM   MICROALBUR 100 03/05/2015 07:47 AM    Kidney Function Lab Results  Component Value Date/Time   CREATININE 1.50 (H) 10/15/2021 06:20 AM   CREATININE 1.19 (H) 10/09/2021 10:30 AM   CREATININE 1.54 (H) 08/22/2021 08:38 AM   CREATININE 1.09 (H) 03/17/2021 09:40 AM   GFRNONAA 47 (L) 10/09/2021 10:30 AM   GFRNONAA 47 (L) 04/15/2020  11:16 AM   GFRAA 55 (L) 04/15/2020 11:16 AM   Current antihyperglycemic regimen:  Glimepiride 4 mg 1 tablet daily  What recent interventions/DTPs have been made to improve glycemic control:  None ID  Have there been any recent hospitalizations or ED visits since last visit with CPP? No  Patient denies hypoglycemic symptoms, including Pale, Sweaty, Shaky, Hungry, Nervous/irritable, and Vision changes Patient denies hyperglycemic symptoms, including blurry vision, excessive thirst, fatigue, polyuria, and weakness  How often are you checking your blood sugar? once daily What are your blood sugars ranging?  Fasting: 112  During the week, how often does your blood glucose drop below 70? Never Are you checking your feet daily/regularly? Yes  Adherence Review: Is the patient currently on a STATIN medication? Yes Is the patient currently on ACE/ARB medication? Yes Does the patient have >5 day gap between last estimated fill dates? Yes patient has a Gap in her Glimepiride 4 mg  I spoke to patient and she is recovering from Hip surgery. Per patient she is doing well after her surgery and has had no complications. Patient is currently still active with PT. Per patient her blood sugar numbers are doing well, but she is out of her Glimepiride. I informed patient that I would send a request to CPP to have him send refill to her [facility] on file.   Per patient she is also out of her fluid pills in which she is unable to provide the name for them and advised that she thinks her Orthopedic provider was the one who prescribed the medication. I don't see any fluid medication listed in her current medication list on her chart with [place] nor in her notes with Ortho. Patient denies any ill symptoms at this time, and has no additional concerns or issues.    I was able to contact Dr. Odis Luster office and spoke with the nurse who doesn't see this type of medication being prescribed by this provider so  therefore was unable to assist.   I tried calling the patient back to see if she could get me the name of the medication but there was no answer and the patient does not have a VM setup for me to leave a message.   Patient has a telephone follow-up appointment with Angelena Sole, CPP on 02/04/2022 @ 0830.  Adelene Idler, CPA/CMA Clinical Pharmacist Assistant Phone: [phone]

## 2021-11-27 NOTE — Addendum Note (Signed)
Addended by: Julious Payer A on: 11/27/2021 01:57 PM   Modules accepted: Orders

## 2021-11-28 ENCOUNTER — Ambulatory Visit: Payer: Self-pay

## 2021-11-28 ENCOUNTER — Encounter: Payer: Self-pay | Admitting: Family Medicine

## 2021-11-28 ENCOUNTER — Telehealth (INDEPENDENT_AMBULATORY_CARE_PROVIDER_SITE_OTHER): Payer: Medicare Other | Admitting: Family Medicine

## 2021-11-28 DIAGNOSIS — J209 Acute bronchitis, unspecified: Secondary | ICD-10-CM | POA: Diagnosis not present

## 2021-11-28 MED ORDER — PREDNISONE 20 MG PO TABS: 40.0000 mg | ORAL_TABLET | Freq: Every day | ORAL | 0 refills | Status: DC

## 2021-11-28 MED ORDER — BENZONATATE 100 MG PO CAPS: 100.0000 mg | ORAL_CAPSULE | Freq: Three times a day (TID) | ORAL | 1 refills | Status: DC | PRN

## 2021-11-28 MED ORDER — IPRATROPIUM-ALBUTEROL 0.5-2.5 (3) MG/3ML IN SOLN: 3.0000 mL | Freq: Four times a day (QID) | RESPIRATORY_TRACT | 1 refills | Status: AC | PRN

## 2021-11-28 NOTE — Telephone Encounter (Signed)
Patient said she has been experiencing a bad dry cough for 2-3 days. Patient says she has COPD. Patient would like to have an antibiotic called in. Please advise patient.     Chief Complaint: Non-productive cough x 3 days. PT at her home today and said chest sounded clear, per pt. Declines OV due to hip replacement. Asking for antibiotic and cough medicine. Symptoms: Above Frequency: 3 days ago Pertinent Negatives: Patient denies fever, no wheezing Disposition: [] ED /[] Urgent Care (no appt availability in office) / [] Appointment(In office/virtual)/ []  [facility]/ [] Home Care/ [] Refused Recommended Disposition /[] [place] [facility]/ [x]  Follow-up with PCP Additional Notes: Please advise pt.  Answer Assessment - Initial Assessment Questions 1. ONSET: "When did the cough begin?"      3 days ago 2. SEVERITY: "How bad is the cough today?"      Severe 3. SPUTUM: "Describe the color of your sputum" (none, dry cough; clear, white, yellow, green)     None 4. HEMOPTYSIS: "Are you coughing up any blood?" If so ask: "How much?" (flecks, streaks, tablespoons, etc.)     No 5. DIFFICULTY BREATHING: "Are you having difficulty breathing?" If Yes, ask: "How bad is it?" (e.g., mild, moderate, severe)    - MILD: No SOB at rest, mild SOB with walking, speaks normally in sentences, can lie down, no retractions, pulse < 100.    - MODERATE: SOB at rest, SOB with minimal exertion and prefers to sit, cannot lie down flat, speaks in phrases, mild retractions, audible wheezing, pulse 100-120.    - SEVERE: Very SOB at rest, speaks in single words, struggling to breathe, sitting hunched forward, retractions, pulse > 120      No 6. FEVER: "Do you have a fever?" If Yes, ask: "What is your temperature, how was it measured, and when did it start?"     No 7. CARDIAC HISTORY: "Do you have any history of heart disease?" (e.g., heart attack, congestive heart failure)      No 8. LUNG HISTORY: "Do you have any  history of lung disease?"  (e.g., pulmonary embolus, asthma, emphysema)     COPD 9. PE RISK FACTORS: "Do you have a history of blood clots?" (or: recent major surgery, recent prolonged travel, bedridden)     No 10. OTHER SYMPTOMS: "Do you have any other symptoms?" (e.g., runny nose, wheezing, chest pain)       No 11. PREGNANCY: "Is there any chance you are pregnant?" "When was your last menstrual period?"       No 12. TRAVEL: "Have you traveled out of the country in the last month?" (e.g., travel history, exposures)       No  Protocols used: Cough - Acute Non-Productive-A-AH

## 2021-11-28 NOTE — Telephone Encounter (Signed)
Pt has an appt for this afternoon virtual at 3:40 with leisa

## 2021-11-28 NOTE — Progress Notes (Signed)
Name: Julie Keith   MRN: [id number]    DOB: 09-24-1945   Date:11/28/2021       Progress Note  Subjective:    Chief Complaint  Chief Complaint  Patient presents with   Cough    Dry cough onset for 3 days, not done COVID testing, no other sx related. Unable to give vital signs    I connected with  [name] on 11/28/21 at  3:40 PM EDT by telephone and verified that I am speaking with the correct person using two identifiers.   I discussed the limitations, risks, security and privacy concerns of performing an evaluation and management service by telephone and the availability of in person appointments. Staff also discussed with the patient that there may be a patient responsible charge related to this service.  Patient verbalized understanding and agreed to proceed with encounter. Patient Location: home Provider Location: [facility] Additional Individuals present: none  HPI Pt presents via telephone for 2-3 d of non-productive coughing fits with audible wheeze, sometimes makes her nearly vomit.  She states she has not had any fever, chills, sweats, chest pain, shortness of breath, increased oxygen requirement, nasal congestion, nasal discharge.  She states that the change in weather with rain and humidity she believes has caused some of her symptoms.  She has not been around anyone sick She has history of COPD with chronic respiratory failure maintained on 2 L of oxygen 24 hours a day was recently assessed by pulmonology and she is pending PFTs.  She was also put on Trelegy inhaler and she states she has a Trelegy inhaler is using daily and also has an albuterol inhaler and nebulizer breathing treatments at home.  She has tried over-the-counter cough medications without improvement, she has not tried a breathing treatment or her rescue inhaler at all in the past 3 days. 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/2020   Osteoarthritis of knee 03/09/2020   Osteoarthritis of left knee 02/28/2020   Stage 3 chronic kidney disease ([place]) 09/21/2019   Thrombocytosis 04/07/2017   Psoriasis 10/10/2015   GERD (gastroesophageal reflux disease) 10/10/2015   Type 2 diabetes mellitus without complication, without long-term current use of insulin ([place]) 10/31/2014   Essential hypertension 10/31/2014   Hyperlipidemia 10/31/2014   Class 3 severe obesity with serious comorbidity and body mass index (BMI) of 40.0 to 44.9 in adult [facility]) 10/31/2014   Pulmonary emphysema ([place]) 06/28/2014   Chronic respiratory failure ([place]) 06/28/2014    Social History   Tobacco Use   Smoking status: Former    Packs/day: 2.00    Years: 30.00    Total pack years: 60.00    Types: Cigarettes    Quit date: 1990    Years since quitting: 33.6   Smokeless tobacco: Never  Substance Use Topics   Alcohol use: No    Alcohol/week: 0.0 standard drinks of alcohol     Current Outpatient Medications:    acetaminophen (TYLENOL) 500 MG tablet, Take 1,000 mg by mouth every 8 (eight) hours as needed for moderate pain., Disp: , Rfl:    albuterol (VENTOLIN HFA) 108 (90 Base) MCG/ACT  inhaler, Inhale 2 puffs into the lungs every 6 (six) hours as needed for wheezing or shortness of breath., Disp: 8 g, Rfl: 0   Apremilast (OTEZLA) 30 MG TABS, Take 30 mg by mouth in the morning and at bedtime., Disp: , Rfl:    aspirin 81 MG chewable tablet, Chew 1 tablet (81 mg total) by mouth 2 (two) times daily., Disp: 60 tablet, Rfl: 0   carvedilol (COREG) 6.25 MG tablet, Take 1 tablet (6.25 mg total) by mouth 2 (two) times daily., Disp: 180 tablet, Rfl: 3   cetirizine (ZYRTEC) 10 MG tablet, Take 1 tablet (10 mg total) by  mouth daily., Disp: 30 tablet, Rfl: 11   enalapril (VASOTEC) 20 MG tablet, Take 1 tablet (20 mg total) by mouth 2 (two) times daily., Disp: 180 tablet, Rfl: 2   EQUATE STOOL SOFTENER 100 MG capsule, Take 100 mg by mouth 2 (two) times daily., Disp: , Rfl:    fluticasone (FLONASE) 50 MCG/ACT nasal spray, Place 2 sprays into both nostrils daily., Disp: 16 g, Rfl: 6   Fluticasone-Umeclidin-Vilant (TRELEGY ELLIPTA) 100-62.5-25 MCG/ACT AEPB, Inhale 1 puff into the lungs daily., Disp: 1 each, Rfl: 11   glimepiride (AMARYL) 4 MG tablet, Take 1 tablet (4 mg total) by mouth daily with breakfast., Disp: 90 tablet, Rfl: 1   glucose blood (ONETOUCH ULTRA) test strip, USE 1 STRIP TO CHECK GLUCOSE IN THE MORNING, AT NOON, AND AT BEDTIME., Disp: 100 each, Rfl: 1   hydrochlorothiazide (HYDRODIURIL) 25 MG tablet, Take 1 tablet (25 mg total) by mouth daily., Disp: 90 tablet, Rfl: 3   nystatin cream (MYCOSTATIN), Apply topically., Disp: , Rfl:    OXYGEN, Inhale 2 L into the lungs at bedtime. Uses APRIA at night, Disp: , Rfl:    pantoprazole (PROTONIX) 20 MG tablet, Take 1 tablet (20 mg total) by mouth daily., Disp: 90 tablet, Rfl: 3   pravastatin (PRAVACHOL) 80 MG tablet, Take 1 tablet (80 mg total) by mouth daily., Disp: 90 tablet, Rfl: 3  Allergies  Allergen Reactions   Amlodipine     Lower extremity swelling   Metformin And Related     headaches    Chart Review: I personally reviewed active problem list, medication list, allergies, family history, social history, health maintenance, notes from last encounter, lab results, imaging with the patient/caregiver today.   Review of Systems  Constitutional: Negative.   HENT: Negative.    Eyes: Negative.   Respiratory: Negative.    Cardiovascular: Negative.   Gastrointestinal: Negative.   Endocrine: Negative.   Genitourinary: Negative.   Musculoskeletal: Negative.   Skin: Negative.   Allergic/Immunologic: Negative.   Neurological: Negative.    Hematological: Negative.   Psychiatric/Behavioral: Negative.    All other systems reviewed and are negative.    Objective:    Virtual encounter, vitals limited, only able to obtain the following There were no vitals filed for this visit. There is no height or weight on file to calculate BMI. Nursing Note and Vital Signs reviewed.  Physical Exam Vitals reviewed.  Pulmonary:     Comments: Coughing fits during encounter with audible wheeze when coughing, in between coughing fits she is able to speak in full and complete sentences and there was no audible respiratory distress or tachypnea    PE limited by telephone encounter  No results found for this or any previous visit (from the past 72 hour(s)).  Assessment and Plan:     ICD-10-CM   1. Bronchospasm with bronchitis, acute  J20.9  ipratropium-albuterol (DUONEB) 0.5-2.5 (3) MG/3ML SOLN    predniSONE (DELTASONE) 20 MG tablet    benzonatate (TESSALON) 100 MG capsule   non productive, coughing fits, no CP, no increased O2 demand, afebrile, tx wtih nebs, prednisone burst, cough meds, f/up if worsening    No sick contacts, denies nasal symptoms or congestion, afebrile, no chest pain, shortness of breath, fatigue, sweats 2 to 3 days of symptoms with chronic respiratory failure and COPD, recently established with pulmonology She has tried a few over-the-counter medications but they were not helpful She has not tried her albuterol rescue inhaler or doing a breathing treatment at home -unclear why she has not done this She does state that [employer] have been effective for her in the past  I recommended monitoring her pulse Ox on her 2 L O2 via [place], doing DuoNeb breathing treatments several times a day for the first couple days, starting prednisone, using over-the-counter cough medication such as Delsym, Coricidin, Mucinex  Hopefully this will help her start to feel better in the next couple days, did urged her to go to the ER if  she has any new chest pain, shortness of breath, any worsening symptoms despite doing breathing treatments or rescue inhalers, or if her oxygen demand increases to maintain her over 88-90%  -Red flags and when to present for emergency care or RTC including but not limited to new/worsening/un-resolving symptoms, reviewed with patient at time of visit. Follow up and care instructions discussed and provided in AVS. - I discussed the assessment and treatment plan with the patient. The patient was provided an opportunity to ask questions and all were answered. The patient agreed with the plan and demonstrated an understanding of the instructions.  - The patient was advised to call back or seek an in-person evaluation if the symptoms worsen or if the condition fails to improve as anticipated.  I provided 20 minutes of non-face-to-face time during this encounter.  Danelle Berry, PA-C 11/28/21 3:57 PM

## 2021-11-29 ENCOUNTER — Emergency Department: Payer: Medicare Other

## 2021-11-29 ENCOUNTER — Observation Stay
Admission: EM | Admit: 2021-11-29 | Discharge: 2021-11-30 | Disposition: A | Discharge status: Home / Self Care | Payer: Medicare Other | Attending: Family Medicine | Admitting: Family Medicine

## 2021-11-29 DIAGNOSIS — R443 Hallucinations, unspecified: Secondary | ICD-10-CM | POA: Diagnosis not present

## 2021-11-29 DIAGNOSIS — R059 Cough, unspecified: Secondary | ICD-10-CM | POA: Diagnosis present

## 2021-11-29 DIAGNOSIS — J441 Chronic obstructive pulmonary disease with (acute) exacerbation: Secondary | ICD-10-CM | POA: Diagnosis present

## 2021-11-29 DIAGNOSIS — R0689 Other abnormalities of breathing: Secondary | ICD-10-CM | POA: Diagnosis not present

## 2021-11-29 DIAGNOSIS — G9340 Encephalopathy, unspecified: Secondary | ICD-10-CM | POA: Diagnosis not present

## 2021-11-29 DIAGNOSIS — U071 COVID-19: Principal | ICD-10-CM | POA: Diagnosis present

## 2021-11-29 DIAGNOSIS — R112 Nausea with vomiting, unspecified: Secondary | ICD-10-CM | POA: Insufficient documentation

## 2021-11-29 DIAGNOSIS — R4182 Altered mental status, unspecified: Secondary | ICD-10-CM | POA: Insufficient documentation

## 2021-11-29 DIAGNOSIS — Z9981 Dependence on supplemental oxygen: Secondary | ICD-10-CM | POA: Insufficient documentation

## 2021-11-29 DIAGNOSIS — G9341 Metabolic encephalopathy: Secondary | ICD-10-CM

## 2021-11-29 DIAGNOSIS — R410 Disorientation, unspecified: Secondary | ICD-10-CM | POA: Insufficient documentation

## 2021-11-29 DIAGNOSIS — R739 Hyperglycemia, unspecified: Secondary | ICD-10-CM | POA: Diagnosis not present

## 2021-11-29 DIAGNOSIS — D649 Anemia, unspecified: Secondary | ICD-10-CM

## 2021-11-29 DIAGNOSIS — N1832 Chronic kidney disease, stage 3b: Secondary | ICD-10-CM | POA: Diagnosis present

## 2021-11-29 DIAGNOSIS — I10 Essential (primary) hypertension: Secondary | ICD-10-CM | POA: Diagnosis present

## 2021-11-29 DIAGNOSIS — E1122 Type 2 diabetes mellitus with diabetic chronic kidney disease: Secondary | ICD-10-CM | POA: Diagnosis not present

## 2021-11-29 DIAGNOSIS — J9610 Chronic respiratory failure, unspecified whether with hypoxia or hypercapnia: Secondary | ICD-10-CM | POA: Diagnosis present

## 2021-11-29 DIAGNOSIS — I700 Atherosclerosis of aorta: Secondary | ICD-10-CM | POA: Diagnosis not present

## 2021-11-29 DIAGNOSIS — R109 Unspecified abdominal pain: Secondary | ICD-10-CM | POA: Diagnosis not present

## 2021-11-29 DIAGNOSIS — J439 Emphysema, unspecified: Secondary | ICD-10-CM | POA: Diagnosis present

## 2021-11-29 DIAGNOSIS — E669 Obesity, unspecified: Secondary | ICD-10-CM | POA: Insufficient documentation

## 2021-11-29 DIAGNOSIS — E785 Hyperlipidemia, unspecified: Secondary | ICD-10-CM

## 2021-11-29 DIAGNOSIS — N189 Chronic kidney disease, unspecified: Secondary | ICD-10-CM | POA: Insufficient documentation

## 2021-11-29 DIAGNOSIS — K219 Gastro-esophageal reflux disease without esophagitis: Secondary | ICD-10-CM | POA: Diagnosis present

## 2021-11-29 DIAGNOSIS — E119 Type 2 diabetes mellitus without complications: Secondary | ICD-10-CM

## 2021-11-29 DIAGNOSIS — R0902 Hypoxemia: Secondary | ICD-10-CM | POA: Diagnosis not present

## 2021-11-29 DIAGNOSIS — J209 Acute bronchitis, unspecified: Secondary | ICD-10-CM

## 2021-11-29 LAB — URINALYSIS, ROUTINE W REFLEX MICROSCOPIC
Bacteria, UA: NONE SEEN
Bilirubin Urine: NEGATIVE
Glucose, UA: 150 mg/dL — AB
Hgb urine dipstick: NEGATIVE
Ketones, ur: NEGATIVE mg/dL
Leukocytes,Ua: NEGATIVE
Nitrite: NEGATIVE
Protein, ur: 30 mg/dL — AB
Specific Gravity, Urine: 1.018 (ref 1.005–1.030)
pH: 7 (ref 5.0–8.0)

## 2021-11-29 LAB — CBC WITH DIFFERENTIAL/PLATELET
Abs Immature Granulocytes: 0.03 10*3/uL (ref 0.00–0.07)
Basophils Absolute: 0 10*3/uL (ref 0.0–0.1)
Basophils Relative: 0 %
Eosinophils Absolute: 0 10*3/uL (ref 0.0–0.5)
Eosinophils Relative: 0 %
HCT: 35.9 % — ABNORMAL LOW (ref 36.0–46.0)
Hemoglobin: 11 g/dL — ABNORMAL LOW (ref 12.0–15.0)
Immature Granulocytes: 1 %
Lymphocytes Relative: 19 %
Lymphs Abs: 1 10*3/uL (ref 0.7–4.0)
MCH: 27.4 pg (ref 26.0–34.0)
MCHC: 30.6 g/dL (ref 30.0–36.0)
MCV: 89.3 fL (ref 80.0–100.0)
Monocytes Absolute: 0.2 10*3/uL (ref 0.1–1.0)
Monocytes Relative: 3 %
Neutro Abs: 3.8 10*3/uL (ref 1.7–7.7)
Neutrophils Relative %: 77 %
Platelets: 348 10*3/uL (ref 150–400)
RBC: 4.02 MIL/uL (ref 3.87–5.11)
RDW: 14 % (ref 11.5–15.5)
WBC: 5 10*3/uL (ref 4.0–10.5)
nRBC: 0 % (ref 0.0–0.2)

## 2021-11-29 LAB — COMPREHENSIVE METABOLIC PANEL
ALT: 16 U/L (ref 0–44)
AST: 31 U/L (ref 15–41)
Albumin: 3.4 g/dL — ABNORMAL LOW (ref 3.5–5.0)
Alkaline Phosphatase: 97 U/L (ref 38–126)
Anion gap: 9 (ref 5–15)
BUN: 24 mg/dL — ABNORMAL HIGH (ref 8–23)
CO2: 29 mmol/L (ref 22–32)
Calcium: 8.8 mg/dL — ABNORMAL LOW (ref 8.9–10.3)
Chloride: 97 mmol/L — ABNORMAL LOW (ref 98–111)
Creatinine, Ser: 1.31 mg/dL — ABNORMAL HIGH (ref 0.44–1.00)
GFR, Estimated: 42 mL/min — ABNORMAL LOW (ref >60)
Glucose, Bld: 282 mg/dL — ABNORMAL HIGH (ref 70–99)
Potassium: 4.2 mmol/L (ref 3.5–5.1)
Sodium: 135 mmol/L (ref 135–145)
Total Bilirubin: 0.6 mg/dL (ref 0.3–1.2)
Total Protein: 7.8 g/dL (ref 6.5–8.1)

## 2021-11-29 LAB — BLOOD GAS, VENOUS
Acid-Base Excess: 9.1 mmol/L — ABNORMAL HIGH (ref 0.0–2.0)
Bicarbonate: 34.2 mmol/L — ABNORMAL HIGH (ref 20.0–28.0)
O2 Saturation: 93.4 %
Patient temperature: 37
pCO2, Ven: 47 mmHg (ref 44–60)
pH, Ven: 7.47 — ABNORMAL HIGH (ref 7.25–7.43)
pO2, Ven: 61 mmHg — ABNORMAL HIGH (ref 32–45)

## 2021-11-29 LAB — CBG MONITORING, ED: Glucose-Capillary: 295 mg/dL — ABNORMAL HIGH (ref 70–99)

## 2021-11-29 LAB — LACTIC ACID, PLASMA: Lactic Acid, Venous: 2.5 mmol/L (ref 0.5–1.9)

## 2021-11-29 MED ORDER — IPRATROPIUM-ALBUTEROL 0.5-2.5 (3) MG/3ML IN SOLN
3.0000 mL | Freq: Once | RESPIRATORY_TRACT | Status: AC
  Administered 2021-11-30: 3 mL via RESPIRATORY_TRACT
  Filled 2021-11-29: qty 3

## 2021-11-29 MED ORDER — IOHEXOL 300 MG/ML  SOLN
80.0000 mL | Freq: Once | INTRAMUSCULAR | Status: AC | PRN
  Administered 2021-11-29: 80 mL via INTRAVENOUS

## 2021-11-29 MED ORDER — IPRATROPIUM-ALBUTEROL 0.5-2.5 (3) MG/3ML IN SOLN
3.0000 mL | Freq: Once | RESPIRATORY_TRACT | Status: AC
  Administered 2021-11-29: 3 mL via RESPIRATORY_TRACT
  Filled 2021-11-29: qty 3

## 2021-11-29 MED ORDER — SODIUM CHLORIDE 0.9 % IV BOLUS
1000.0000 mL | Freq: Once | INTRAVENOUS | Status: AC
  Administered 2021-11-30: 1000 mL via INTRAVENOUS

## 2021-11-29 NOTE — ED Provider Notes (Signed)
[facility] Provider Note    Event Date/Time   First MD Initiated Contact with Patient 11/29/21 2202     (approximate)   History   Altered Mental Status and Hypertension (Patient C/O AMS, hypertension, nausea, vomiting, and diarrhea that began 4 days ago. )   HPI  Julie Keith is a 76 y.o. female here with altered mental status, nausea, vomiting, and diarrhea.  The patient reportedly has had a cough, nausea, vomiting, and diarrhea for the last several days.  She has been treated as an outpatient for this.  She was also recently started on an oral antibiotic by her PCP and has been taking a breathing treatment.  Over the last 24 hours, patient has had worsening nausea, vomiting, diarrhea.  Family came to check on her today and she was also confused, hallucinating, and asking for family members that had already died.  She has never anything like this before.  Currently, she states she feels unwell but is able to tell me that she is at the hospital.  She denies any headache or neck pain or neck stiffness.     Physical Exam   Triage Vital Signs: ED Triage Vitals [11/29/21 2157]  Enc Vitals Group     BP 127/73     Pulse Rate 71     Resp 19     Temp 97.8 F (36.6 C)     Temp Source Oral     SpO2 99 %     Weight      Height      Head Circumference      Peak Flow      Pain Score      Pain Loc      Pain Edu?      Excl. in GC?     Most recent vital signs: Vitals:   11/29/21 2200 11/30/21 0100  BP: (!) 147/135 (!) 142/118  Pulse: 75 69  Resp: 17   Temp:    SpO2: 99% 99%     General: Awake, no distress.  CV:  Good peripheral perfusion.  Regular rate and rhythm.  No murmurs. Resp:  Normal effort.  Lungs clear to auscultation bilaterally. Abd:  No distention.  Moderate diffuse tenderness. Other:  Oriented person but not place or time.  Moves all extremities with 5 and 5 strength.  No focal deficits.   ED Results / Procedures / Treatments    Labs (all labs ordered are listed, but only abnormal results are displayed) Labs Reviewed  SARS CORONAVIRUS 2 BY RT PCR - Abnormal; Notable for the following components:      Result Value   SARS Coronavirus 2 by RT PCR POSITIVE (*)    All other components within normal limits  CBC WITH DIFFERENTIAL/PLATELET - Abnormal; Notable for the following components:   Hemoglobin 11.0 (*)    HCT 35.9 (*)    All other components within normal limits  COMPREHENSIVE METABOLIC PANEL - Abnormal; Notable for the following components:   Chloride 97 (*)    Glucose, Bld 282 (*)    BUN 24 (*)    Creatinine, Ser 1.31 (*)    Calcium 8.8 (*)    Albumin 3.4 (*)    GFR, Estimated 42 (*)    All other components within normal limits  LACTIC ACID, PLASMA - Abnormal; Notable for the following components:   Lactic Acid, Venous 2.5 (*)    All other components within normal limits  BLOOD GAS, VENOUS -  Abnormal; Notable for the following components:   pH, Ven 7.47 (*)    pO2, Ven 61 (*)    Bicarbonate 34.2 (*)    Acid-Base Excess 9.1 (*)    All other components within normal limits  URINALYSIS, ROUTINE W REFLEX MICROSCOPIC - Abnormal; Notable for the following components:   Color, Urine YELLOW (*)    APPearance CLEAR (*)    Glucose, UA 150 (*)    Protein, ur 30 (*)    All other components within normal limits  CBG MONITORING, ED - Abnormal; Notable for the following components:   Glucose-Capillary 295 (*)    All other components within normal limits  CULTURE, BLOOD (ROUTINE X 2)  CULTURE, BLOOD (ROUTINE X 2)  URINE CULTURE  LACTIC ACID, PLASMA  CBC  C-REACTIVE PROTEIN  D-DIMER, QUANTITATIVE  FERRITIN  LACTATE DEHYDROGENASE  PROCALCITONIN  TROPONIN I (HIGH SENSITIVITY)     EKG Normal sinus rhythm, ventricular rate 67.  PR 136, QRS 112, QTc 453.  No acute ST elevations or depressions.   RADIOLOGY CT head: No acute intracranial abnormality Chest x-ray: Clear   I also independently  reviewed and agree with radiologist interpretations.   PROCEDURES:  Critical Care performed: No   MEDICATIONS ORDERED IN ED: Medications  Apremilast TABS 30 mg (has no administration in time range)  aspirin chewable tablet 81 mg (has no administration in time range)  carvedilol (COREG) tablet 6.25 mg (has no administration in time range)  enalapril (VASOTEC) tablet 20 mg (has no administration in time range)  hydrochlorothiazide (HYDRODIURIL) tablet 25 mg (has no administration in time range)  glimepiride (AMARYL) tablet 4 mg (has no administration in time range)  pantoprazole (PROTONIX) EC tablet 20 mg (has no administration in time range)  albuterol (VENTOLIN HFA) 108 (90 Base) MCG/ACT inhaler 2 puff (has no administration in time range)  benzonatate (TESSALON) capsule 100-200 mg (has no administration in time range)  loratadine (CLARITIN) tablet 10 mg (has no administration in time range)  fluticasone (FLONASE) 50 MCG/ACT nasal spray 2 spray (has no administration in time range)  ipratropium-albuterol (DUONEB) 0.5-2.5 (3) MG/3ML nebulizer solution 3 mL (has no administration in time range)  nystatin cream (MYCOSTATIN) (has no administration in time range)  cefTRIAXone (ROCEPHIN) 1 g in sodium chloride 0.9 % 100 mL IVPB (has no administration in time range)  enoxaparin (LOVENOX) injection 40 mg (has no administration in time range)  0.9 %  sodium chloride infusion (has no administration in time range)  acetaminophen (TYLENOL) tablet 650 mg (has no administration in time range)    Or  acetaminophen (TYLENOL) suppository 650 mg (has no administration in time range)  magnesium hydroxide (MILK OF MAGNESIA) suspension 30 mL (has no administration in time range)  traZODone (DESYREL) tablet 25 mg (has no administration in time range)  ondansetron (ZOFRAN) tablet 4 mg (has no administration in time range)    Or  ondansetron (ZOFRAN) injection 4 mg (has no administration in time range)   guaiFENesin-dextromethorphan (ROBITUSSIN DM) 100-10 MG/5ML syrup 10 mL (has no administration in time range)  chlorpheniramine-HYDROcodone 10-8 MG/5ML suspension 5 mL (has no administration in time range)  ascorbic acid (VITAMIN C) tablet 500 mg (has no administration in time range)  zinc sulfate capsule 220 mg (has no administration in time range)  methylPREDNISolone sodium succinate (SOLU-MEDROL) injection 0.5 mg/kg (has no administration in time range)    Followed by  predniSONE (DELTASONE) tablet 50 mg (has no administration in time range)  nirmatrelvir/ritonavir EUA (PAXLOVID)  3 tablet (has no administration in time range)  guaiFENesin (MUCINEX) 12 hr tablet 600 mg (has no administration in time range)  pravastatin (PRAVACHOL) tablet 80 mg (has no administration in time range)  ipratropium-albuterol (DUONEB) 0.5-2.5 (3) MG/3ML nebulizer solution 3 mL (3 mLs Nebulization Given 11/29/21 2236)  ipratropium-albuterol (DUONEB) 0.5-2.5 (3) MG/3ML nebulizer solution 3 mL (3 mLs Nebulization Given 11/29/21 2231)  sodium chloride 0.9 % bolus 1,000 mL (0 mLs Intravenous Stopped 11/30/21 0207)  ipratropium-albuterol (DUONEB) 0.5-2.5 (3) MG/3ML nebulizer solution 3 mL (3 mLs Nebulization Given 11/30/21 0032)  iohexol (OMNIPAQUE) 300 MG/ML solution 80 mL (80 mLs Intravenous Contrast Given 11/29/21 2336)     IMPRESSION / MDM / ASSESSMENT AND PLAN / ED COURSE  I reviewed the triage vital signs and the nursing notes.                               The patient is on the cardiac monitor to evaluate for evidence of arrhythmia and/or significant heart rate changes.   Ddx:  Differential includes the following, with pertinent life- or limb-threatening emergencies considered:  Acute encephalopathy 2/2 sepsis, occult UTI or PNA, COPD exacerbation, COVID-19, hypercapnia, polypharmacy  Patient's presentation is most consistent with acute presentation with potential threat to life or bodily function.  MDM:   76 yo F here with acute encephalopathy in setting of suspected sepsis/infection, with cough, n/v/d and general fatigue. Pt has diffuse wheezing on exam, for which she was given duonebs x 3. COVID-19 positive which is likely etiology. LA elevated likely from WOB and COVID. UA negative. CBC unremarkable. CMP with likely mild dehydration and hyperglycemia. CT head negative - suspect her AMS is more so delirium. CT A/P obtained initially given abd pain, n/v/d and is unremarkable. CXR clear.  Will admit for AMS, weakness likely 2/2 COVID-19 with concomitant COPD exacerbation.   MEDICATIONS GIVEN IN ED: Medications  Apremilast TABS 30 mg (has no administration in time range)  aspirin chewable tablet 81 mg (has no administration in time range)  carvedilol (COREG) tablet 6.25 mg (has no administration in time range)  enalapril (VASOTEC) tablet 20 mg (has no administration in time range)  hydrochlorothiazide (HYDRODIURIL) tablet 25 mg (has no administration in time range)  glimepiride (AMARYL) tablet 4 mg (has no administration in time range)  pantoprazole (PROTONIX) EC tablet 20 mg (has no administration in time range)  albuterol (VENTOLIN HFA) 108 (90 Base) MCG/ACT inhaler 2 puff (has no administration in time range)  benzonatate (TESSALON) capsule 100-200 mg (has no administration in time range)  loratadine (CLARITIN) tablet 10 mg (has no administration in time range)  fluticasone (FLONASE) 50 MCG/ACT nasal spray 2 spray (has no administration in time range)  ipratropium-albuterol (DUONEB) 0.5-2.5 (3) MG/3ML nebulizer solution 3 mL (has no administration in time range)  nystatin cream (MYCOSTATIN) (has no administration in time range)  cefTRIAXone (ROCEPHIN) 1 g in sodium chloride 0.9 % 100 mL IVPB (has no administration in time range)  enoxaparin (LOVENOX) injection 40 mg (has no administration in time range)  0.9 %  sodium chloride infusion (has no administration in time range)  acetaminophen  (TYLENOL) tablet 650 mg (has no administration in time range)    Or  acetaminophen (TYLENOL) suppository 650 mg (has no administration in time range)  magnesium hydroxide (MILK OF MAGNESIA) suspension 30 mL (has no administration in time range)  traZODone (DESYREL) tablet 25 mg (has no administration in time range)  ondansetron (ZOFRAN) tablet 4 mg (has no administration in time range)    Or  ondansetron (ZOFRAN) injection 4 mg (has no administration in time range)  guaiFENesin-dextromethorphan (ROBITUSSIN DM) 100-10 MG/5ML syrup 10 mL (has no administration in time range)  chlorpheniramine-HYDROcodone 10-8 MG/5ML suspension 5 mL (has no administration in time range)  ascorbic acid (VITAMIN C) tablet 500 mg (has no administration in time range)  zinc sulfate capsule 220 mg (has no administration in time range)  methylPREDNISolone sodium succinate (SOLU-MEDROL) injection 0.5 mg/kg (has no administration in time range)    Followed by  predniSONE (DELTASONE) tablet 50 mg (has no administration in time range)  nirmatrelvir/ritonavir EUA (PAXLOVID) 3 tablet (has no administration in time range)  guaiFENesin (MUCINEX) 12 hr tablet 600 mg (has no administration in time range)  pravastatin (PRAVACHOL) tablet 80 mg (has no administration in time range)  ipratropium-albuterol (DUONEB) 0.5-2.5 (3) MG/3ML nebulizer solution 3 mL (3 mLs Nebulization Given 11/29/21 2236)  ipratropium-albuterol (DUONEB) 0.5-2.5 (3) MG/3ML nebulizer solution 3 mL (3 mLs Nebulization Given 11/29/21 2231)  sodium chloride 0.9 % bolus 1,000 mL (0 mLs Intravenous Stopped 11/30/21 0207)  ipratropium-albuterol (DUONEB) 0.5-2.5 (3) MG/3ML nebulizer solution 3 mL (3 mLs Nebulization Given 11/30/21 0032)  iohexol (OMNIPAQUE) 300 MG/ML solution 80 mL (80 mLs Intravenous Contrast Given 11/29/21 2336)     Consults:     EMR reviewed       FINAL CLINICAL IMPRESSION(S) / ED DIAGNOSES   Final diagnoses:  Acute encephalopathy   COVID-19     Rx / DC Orders   ED Discharge Orders     None        Note:  This document was prepared using Dragon voice recognition software and may include unintentional dictation errors.   Duffy Bruce, MD 11/30/21 [id number]

## 2021-11-30 DIAGNOSIS — D649 Anemia, unspecified: Secondary | ICD-10-CM

## 2021-11-30 DIAGNOSIS — J441 Chronic obstructive pulmonary disease with (acute) exacerbation: Secondary | ICD-10-CM | POA: Diagnosis not present

## 2021-11-30 DIAGNOSIS — K219 Gastro-esophageal reflux disease without esophagitis: Secondary | ICD-10-CM

## 2021-11-30 DIAGNOSIS — U071 COVID-19: Secondary | ICD-10-CM

## 2021-11-30 DIAGNOSIS — E785 Hyperlipidemia, unspecified: Secondary | ICD-10-CM | POA: Diagnosis not present

## 2021-11-30 DIAGNOSIS — G9340 Encephalopathy, unspecified: Secondary | ICD-10-CM | POA: Diagnosis not present

## 2021-11-30 DIAGNOSIS — I10 Essential (primary) hypertension: Secondary | ICD-10-CM

## 2021-11-30 DIAGNOSIS — G9341 Metabolic encephalopathy: Secondary | ICD-10-CM

## 2021-11-30 LAB — CBC
HCT: 31.8 % — ABNORMAL LOW (ref 36.0–46.0)
Hemoglobin: 9.7 g/dL — ABNORMAL LOW (ref 12.0–15.0)
MCH: 27.3 pg (ref 26.0–34.0)
MCHC: 30.5 g/dL (ref 30.0–36.0)
MCV: 89.6 fL (ref 80.0–100.0)
Platelets: 282 10*3/uL (ref 150–400)
RBC: 3.55 MIL/uL — ABNORMAL LOW (ref 3.87–5.11)
RDW: 14 % (ref 11.5–15.5)
WBC: 5.6 10*3/uL (ref 4.0–10.5)
nRBC: 0 % (ref 0.0–0.2)

## 2021-11-30 LAB — CBG MONITORING, ED
Glucose-Capillary: 260 mg/dL — ABNORMAL HIGH (ref 70–99)
Glucose-Capillary: 184 mg/dL — ABNORMAL HIGH (ref 70–99)

## 2021-11-30 LAB — LACTATE DEHYDROGENASE: LDH: 131 U/L (ref 98–192)

## 2021-11-30 LAB — TROPONIN I (HIGH SENSITIVITY)
Troponin I (High Sensitivity): 27 ng/L — ABNORMAL HIGH (ref <18)
Troponin I (High Sensitivity): 26 ng/L — ABNORMAL HIGH (ref <18)

## 2021-11-30 LAB — FERRITIN: Ferritin: 104 ng/mL (ref 11–307)

## 2021-11-30 LAB — LACTIC ACID, PLASMA: Lactic Acid, Venous: 0.9 mmol/L (ref 0.5–1.9)

## 2021-11-30 LAB — PROCALCITONIN: Procalcitonin: <0.1 ng/mL

## 2021-11-30 LAB — C-REACTIVE PROTEIN: CRP: 2.6 mg/dL — ABNORMAL HIGH (ref <1.0)

## 2021-11-30 LAB — SARS CORONAVIRUS 2 BY RT PCR: SARS Coronavirus 2 by RT PCR: POSITIVE — AB

## 2021-11-30 LAB — D-DIMER, QUANTITATIVE: D-Dimer, Quant: 1.49 ug/mL-FEU — ABNORMAL HIGH (ref 0.00–0.50)

## 2021-11-30 MED ORDER — PANTOPRAZOLE SODIUM 20 MG PO TBEC
20.0000 mg | DELAYED_RELEASE_TABLET | Freq: Every day | ORAL | Status: DC
  Filled 2021-11-30: qty 1

## 2021-11-30 MED ORDER — INSULIN ASPART 100 UNIT/ML IJ SOLN: 0.0000 [IU] | Freq: Every day | INTRAMUSCULAR | Status: DC

## 2021-11-30 MED ORDER — PREDNISONE 20 MG PO TABS: 40.0000 mg | ORAL_TABLET | Freq: Every day | ORAL | Status: DC

## 2021-11-30 MED ORDER — FLUTICASONE PROPIONATE 50 MCG/ACT NA SUSP: 2.0000 | Freq: Every day | NASAL | Status: DC | PRN

## 2021-11-30 MED ORDER — BENZONATATE 100 MG PO CAPS: 100.0000 mg | ORAL_CAPSULE | Freq: Three times a day (TID) | ORAL | Status: DC | PRN

## 2021-11-30 MED ORDER — PRAVASTATIN SODIUM 20 MG PO TABS: 80.0000 mg | ORAL_TABLET | Freq: Every day | ORAL | Status: DC

## 2021-11-30 MED ORDER — ASCORBIC ACID 500 MG PO TABS
500.0000 mg | ORAL_TABLET | Freq: Every day | ORAL | Status: DC
  Administered 2021-11-30: 500 mg via ORAL
  Filled 2021-11-30: qty 1

## 2021-11-30 MED ORDER — BENZONATATE 100 MG PO CAPS: 100.0000 mg | ORAL_CAPSULE | Freq: Three times a day (TID) | ORAL | 1 refills | Status: DC | PRN

## 2021-11-30 MED ORDER — ENALAPRIL MALEATE 10 MG PO TABS
20.0000 mg | ORAL_TABLET | Freq: Two times a day (BID) | ORAL | Status: DC
  Administered 2021-11-30: 20 mg via ORAL
  Filled 2021-11-30: qty 2

## 2021-11-30 MED ORDER — PREDNISONE 20 MG PO TABS
40.0000 mg | ORAL_TABLET | Freq: Every day | ORAL | Status: DC
Start: 2021-11-30 — End: 2021-11-30

## 2021-11-30 MED ORDER — SODIUM CHLORIDE 0.9 % IV SOLN: INTRAVENOUS | Status: DC

## 2021-11-30 MED ORDER — PREDNISONE 20 MG PO TABS: 50.0000 mg | ORAL_TABLET | Freq: Every day | ORAL | Status: DC

## 2021-11-30 MED ORDER — HYDROCHLOROTHIAZIDE 25 MG PO TABS
25.0000 mg | ORAL_TABLET | Freq: Every day | ORAL | Status: DC
  Administered 2021-11-30: 25 mg via ORAL
  Filled 2021-11-30: qty 1

## 2021-11-30 MED ORDER — GUAIFENESIN-DM 100-10 MG/5ML PO SYRP: 10.0000 mL | ORAL_SOLUTION | ORAL | Status: DC | PRN

## 2021-11-30 MED ORDER — NYSTATIN 100000 UNIT/GM EX CREA
TOPICAL_CREAM | Freq: Two times a day (BID) | CUTANEOUS | Status: DC
  Filled 2021-11-30: qty 30

## 2021-11-30 MED ORDER — ENOXAPARIN SODIUM 40 MG/0.4ML IJ SOSY
40.0000 mg | PREFILLED_SYRINGE | INTRAMUSCULAR | Status: DC
  Administered 2021-11-30: 40 mg via SUBCUTANEOUS
  Filled 2021-11-30: qty 0.4

## 2021-11-30 MED ORDER — ALBUTEROL SULFATE HFA 108 (90 BASE) MCG/ACT IN AERS: 2.0000 | INHALATION_SPRAY | Freq: Four times a day (QID) | RESPIRATORY_TRACT | Status: DC | PRN

## 2021-11-30 MED ORDER — ACETAMINOPHEN 325 MG PO TABS: 650.0000 mg | ORAL_TABLET | Freq: Four times a day (QID) | ORAL | Status: DC | PRN

## 2021-11-30 MED ORDER — APREMILAST 30 MG PO TABS
30.0000 mg | ORAL_TABLET | Freq: Two times a day (BID) | ORAL | Status: DC
Start: 2021-11-30 — End: 2021-11-30

## 2021-11-30 MED ORDER — NIRMATRELVIR/RITONAVIR (PAXLOVID)TABLET: 3.0000 | ORAL_TABLET | Freq: Two times a day (BID) | ORAL | Status: DC

## 2021-11-30 MED ORDER — ZINC SULFATE 220 (50 ZN) MG PO CAPS
220.0000 mg | ORAL_CAPSULE | Freq: Every day | ORAL | Status: DC
  Administered 2021-11-30: 220 mg via ORAL
  Filled 2021-11-30: qty 1

## 2021-11-30 MED ORDER — ACETAMINOPHEN 325 MG RE SUPP: 650.0000 mg | Freq: Four times a day (QID) | RECTAL | Status: DC | PRN

## 2021-11-30 MED ORDER — METHYLPREDNISOLONE SODIUM SUCC 40 MG IJ SOLR
40.0000 mg | Freq: Once | INTRAMUSCULAR | Status: AC
  Administered 2021-11-30: 40 mg via INTRAVENOUS
  Filled 2021-11-30: qty 1

## 2021-11-30 MED ORDER — CARVEDILOL 6.25 MG PO TABS
6.2500 mg | ORAL_TABLET | Freq: Two times a day (BID) | ORAL | Status: DC
  Administered 2021-11-30: 6.25 mg via ORAL
  Filled 2021-11-30: qty 1

## 2021-11-30 MED ORDER — NIRMATRELVIR/RITONAVIR (PAXLOVID) TABLET (RENAL DOSING)
2.0000 | ORAL_TABLET | Freq: Two times a day (BID) | ORAL | Status: DC
  Administered 2021-11-30: 2 via ORAL
  Filled 2021-11-30: qty 20

## 2021-11-30 MED ORDER — MAGNESIUM HYDROXIDE 400 MG/5ML PO SUSP: 30.0000 mL | Freq: Every day | ORAL | Status: DC | PRN

## 2021-11-30 MED ORDER — ASPIRIN 81 MG PO CHEW
81.0000 mg | CHEWABLE_TABLET | Freq: Two times a day (BID) | ORAL | Status: DC
  Administered 2021-11-30: 81 mg via ORAL
  Filled 2021-11-30: qty 1

## 2021-11-30 MED ORDER — METHYLPREDNISOLONE SODIUM SUCC 40 MG IJ SOLR: 40.0000 mg | Freq: Two times a day (BID) | INTRAMUSCULAR | Status: DC

## 2021-11-30 MED ORDER — ENOXAPARIN SODIUM 60 MG/0.6ML IJ SOSY: 0.5000 mg/kg | PREFILLED_SYRINGE | INTRAMUSCULAR | Status: DC

## 2021-11-30 MED ORDER — GUAIFENESIN ER 600 MG PO TB12
600.0000 mg | ORAL_TABLET | Freq: Two times a day (BID) | ORAL | Status: DC
  Administered 2021-11-30: 600 mg via ORAL
  Filled 2021-11-30: qty 1

## 2021-11-30 MED ORDER — IPRATROPIUM-ALBUTEROL 0.5-2.5 (3) MG/3ML IN SOLN
3.0000 mL | Freq: Four times a day (QID) | RESPIRATORY_TRACT | Status: DC | PRN
Start: 2021-11-30 — End: 2021-11-30

## 2021-11-30 MED ORDER — HYDROCOD POLI-CHLORPHE POLI ER 10-8 MG/5ML PO SUER: 5.0000 mL | Freq: Two times a day (BID) | ORAL | Status: DC | PRN

## 2021-11-30 MED ORDER — GLIMEPIRIDE 4 MG PO TABS
4.0000 mg | ORAL_TABLET | Freq: Every day | ORAL | Status: DC
  Administered 2021-11-30: 4 mg via ORAL
  Filled 2021-11-30: qty 1

## 2021-11-30 MED ORDER — LORATADINE 10 MG PO TABS
10.0000 mg | ORAL_TABLET | Freq: Every day | ORAL | Status: DC
  Administered 2021-11-30: 10 mg via ORAL
  Filled 2021-11-30: qty 1

## 2021-11-30 MED ORDER — TRAZODONE HCL 50 MG PO TABS: 25.0000 mg | ORAL_TABLET | Freq: Every evening | ORAL | Status: DC | PRN

## 2021-11-30 MED ORDER — INSULIN ASPART 100 UNIT/ML IJ SOLN
0.0000 [IU] | Freq: Three times a day (TID) | INTRAMUSCULAR | Status: DC
  Administered 2021-11-30: 8 [IU] via SUBCUTANEOUS
  Filled 2021-11-30 (×2): qty 1

## 2021-11-30 MED ORDER — ONDANSETRON HCL 4 MG/2ML IJ SOLN: 4.0000 mg | Freq: Four times a day (QID) | INTRAMUSCULAR | Status: DC | PRN

## 2021-11-30 MED ORDER — ONDANSETRON HCL 4 MG PO TABS: 4.0000 mg | ORAL_TABLET | Freq: Four times a day (QID) | ORAL | Status: DC | PRN

## 2021-11-30 MED ORDER — SODIUM CHLORIDE 0.9 % IV SOLN
1.0000 g | Freq: Every day | INTRAVENOUS | Status: DC
  Administered 2021-11-30: 1 g via INTRAVENOUS
  Filled 2021-11-30: qty 10

## 2021-11-30 MED ORDER — NIRMATRELVIR/RITONAVIR (PAXLOVID) TABLET (RENAL DOSING)
2.0000 | ORAL_TABLET | Freq: Two times a day (BID) | ORAL | 0 refills | Status: AC
Start: 2021-11-30 — End: 2021-12-05

## 2021-11-30 NOTE — Progress Notes (Signed)
PHARMACIST - PHYSICIAN COMMUNICATION  CONCERNING:  Enoxaparin (Lovenox) for DVT Prophylaxis    RECOMMENDATION: Patient was prescribed enoxaprin 40mg  q24 hours for VTE prophylaxis.   Filed Weights   11/30/21 0200  Weight: 95 kg (209 lb 7 oz)    Body mass index is 34.85 kg/m.  Estimated Creatinine Clearance: 41 mL/min (A) (by C-G formula based on SCr of 1.31 mg/dL (H)).   Based on [facility] policy patient is candidate for enoxaparin 0.5mg /kg TBW SQ every 24 hours based on BMI being >30.  DESCRIPTION: Pharmacy has adjusted enoxaparin dose per [facility].  Patient is now receiving enoxaparin 0.5 mg/kg every 24 hours   [facility], PharmD, [facility] 11/30/2021 2:30 AM

## 2021-11-30 NOTE — Assessment & Plan Note (Signed)
-   We will continue statin therapy. 

## 2021-11-30 NOTE — Care Management CC44 (Signed)
Condition Code 44 Documentation Completed  Patient Details  Name: Julie Keith MRN: [id number] Date of Birth: 02-24-1946   Condition Code 44 given:  Yes Patient signature on Condition Code 44 notice:  Yes Documentation of 2 MD's agreement:  Yes Code 44 added to claim:  Yes  Reviewed by phone due to isolation status. Copy printed for patient's records.   Sheila Gervasi E Julie Lo, LCSW 11/30/2021, 2:31 PM

## 2021-11-30 NOTE — Assessment & Plan Note (Signed)
-   The patient will be placed on supplement coverage with NovoLog. - We will continue Amaryl. 

## 2021-11-30 NOTE — Assessment & Plan Note (Addendum)
-   We will continue her antihypertensives. 

## 2021-11-30 NOTE — ED Notes (Signed)
Pt ambulated without assistance from this tech. Pt steady with walker and had no complaints. Nurse made aware.

## 2021-11-30 NOTE — Assessment & Plan Note (Signed)
-   This is manifested by respiratory symptoms as well as GI symptoms with nausea, vomiting and diarrhea. - She will be placed on antiretroviral therapy with Paxlovid. - We will follow inflammatory markers. - She will be placed on vitamin C, vitamin D3 and zinc sulfate. - Management otherwise as above for respiratory symptoms.

## 2021-11-30 NOTE — Hospital Course (Signed)
Julie Keith is a 76 y.o. F with DM, emphysema and chronic respiratory failure on 2L at home, and HTN who presented with cough, wheezing and dyspnea.  In the ER, tested positive for COVID.  CXR clear.

## 2021-11-30 NOTE — Assessment & Plan Note (Addendum)
FEV1 55% predicted 5 years ago.

## 2021-11-30 NOTE — Discharge Summary (Signed)
Physician Discharge Summary   Patient: Julie Keith MRN: [id number] DOB: 07/28/1945  Admit date:     11/29/2021  Discharge date: 11/30/21  Discharge Physician: Edwin Dada   PCP: Delsa Grana, PA-C     Recommendations at discharge:  Take Paxlovid for 4 more days for COVID Finish prednisone burst Follow up with PCP Delsa Grana, PA-C in 10 days     Discharge Diagnoses: Principal Problem:   COVID-19 virus infection Active Problems:   COPD exacerbation ([place])   Acute metabolic encephalopathy   Pulmonary emphysema (HCC)   Chronic respiratory failure (HCC)   Type 2 diabetes mellitus without complication, without long-term current use of insulin (HCC)   Essential hypertension   Hyperlipidemia   Obesity (BMI 30-39.9)   GERD (gastroesophageal reflux disease)   Stage 3b chronic kidney disease (CKD) (HCC)   Dyslipidemia   Normocytic anemia      Hospital Course: Julie Keith is a 76 y.o. F with DM, emphysema and chronic respiratory failure on 2L at home, and HTN who presented with cough, wheezing and dyspnea.  In the ER, tested positive for COVID.  CXR clear.     * COVID-19 virus infection Patient presented with cough, malaise, and GI upset.  She was started on Paxlovid, and felt considerably better in the morning.  She had no hypoxia or pulmonary infiltrates, and was on her home oxygen.  She was able to ambulate with nursing and felt no symptoms, was stable for discharge home, to finish the course of Paxlovid.     COPD exacerbation Chronic respiratory failure FEV1 55% predicted 5 years ago.   Acute respiratory failure ruled out, here she was not wheezing, and felt no dyspnea on exertion.  She was discharged to finish the prednisone taper she had started 3 days before admission, and using her albuterol for the next few days, then back off to as needed use.  Continue home Trelegy.      Acute metabolic encephalopathy At baseline she has no cognitive  impairment, lives alone.  At presentation in the ER, she was oriented to self only.  Family reported she was disoriented and hallucinating at home.     This morning she is oriented to person, place, time, and situation, her encephalopathy resolved.  It was purely due to COVID.  CT head unremarkable.  Obesity (BMI 30-39.9) BMI 34           The [place] Controlled Substances Registry was reviewed for this patient prior to discharge.     Disposition: Home   DISCHARGE MEDICATION: Allergies as of 11/30/2021       Reactions   Amlodipine    Lower extremity swelling   Metformin And Related    headaches        Medication List     STOP taking these medications    predniSONE 20 MG tablet Commonly known as: DELTASONE       TAKE these medications    acetaminophen 500 MG tablet Commonly known as: TYLENOL Take 1,000 mg by mouth every 8 (eight) hours as needed for moderate pain.   albuterol 108 (90 Base) MCG/ACT inhaler Commonly known as: VENTOLIN HFA Inhale 2 puffs into the lungs every 6 (six) hours as needed for wheezing or shortness of breath.   aspirin 81 MG chewable tablet Chew 1 tablet (81 mg total) by mouth 2 (two) times daily.   benzonatate 100 MG capsule Commonly known as: TESSALON Take 1-2 capsules (100-200 mg total) by mouth 3 (  three) times daily as needed for cough.   carvedilol 6.25 MG tablet Commonly known as: COREG Take 1 tablet (6.25 mg total) by mouth 2 (two) times daily.   cetirizine 10 MG tablet Commonly known as: ZYRTEC Take 1 tablet (10 mg total) by mouth daily.   enalapril 20 MG tablet Commonly known as: VASOTEC Take 1 tablet (20 mg total) by mouth 2 (two) times daily.   EQUATE STOOL SOFTENER 100 MG capsule Generic drug: docusate sodium Take 100 mg by mouth 2 (two) times daily.   fluticasone 50 MCG/ACT nasal spray Commonly known as: FLONASE Place 2 sprays into both nostrils daily.   glimepiride 4 MG tablet Commonly known  as: AMARYL Take 1 tablet (4 mg total) by mouth daily with breakfast.   hydrochlorothiazide 25 MG tablet Commonly known as: HYDRODIURIL Take 1 tablet (25 mg total) by mouth daily.   ipratropium-albuterol 0.5-2.5 (3) MG/3ML Soln Commonly known as: DUONEB Take 3 mLs by nebulization every 6 (six) hours as needed (cough, wheeze, shortness of breath).   nirmatrelvir/ritonavir EUA (renal dosing) 10 x 150 MG & 10 x 100MG  Tabs Commonly known as: PAXLOVID Take 2 tablets by mouth 2 (two) times daily for 5 days. Patient GFR is 41. Take nirmatrelvir (150 mg) one tablet twice daily for 5 days and ritonavir (100 mg) one tablet twice daily for 5 days.   nystatin cream Commonly known as: MYCOSTATIN Apply topically.   OneTouch Ultra test strip Generic drug: glucose blood USE 1 STRIP TO CHECK GLUCOSE IN THE MORNING, AT NOON, AND AT BEDTIME.   Otezla 30 MG Tabs Generic drug: Apremilast Take 30 mg by mouth in the morning and at bedtime.   OXYGEN Inhale 2 L into the lungs at bedtime. Uses APRIA at night   pantoprazole 20 MG tablet Commonly known as: PROTONIX Take 1 tablet (20 mg total) by mouth daily.   pravastatin 80 MG tablet Commonly known as: PRAVACHOL Take 1 tablet (80 mg total) by mouth daily.   Trelegy Ellipta 100-62.5-25 MCG/ACT Aepb Generic drug: Fluticasone-Umeclidin-Vilant Inhale 1 puff into the lungs daily.        Follow-up Information     Delsa Grana, PA-C. Schedule an appointment as soon as possible for a visit in 2 week(s).   Specialty: Family Medicine Contact information: [address] [place]  [address] [phone]                 Discharge Instructions     Discharge instructions   Complete by: As directed    From Dr. Loleta Books: You were admitted with COVID. Here, it looked like you were getting better. You were started on the anti-COVID medicine called Paxlovid. Continue Paxlovid for 4 more days Take 2 pills twice daily, as it says  on the package (1 pill of nirmatrelvir, 1 pill of ritonavir, each, morning and night, continuing tonight when you get home)  Finish the prednisone that Delsa Grana prescribed for you this week  Use your albuterol three times a day for the next 3 days, then back off to as needed use   I like the idea of using Tessalon perles as a cough suppressant  Continue your home Trelegy  Go see Delsa Grana in 10 days   Increase activity slowly   Complete by: As directed        Discharge Exam: Filed Weights   11/30/21 0200  Weight: 95 kg    General: Pt is alert, awake, not in acute distress, sitting up  in bed, eating breakfast, no acute distress, nasal cannula in place Cardiovascular: RRR, nl S1-S2, no murmurs appreciated.   No LE edema.   Respiratory: Normal respiratory rate and rhythm.  CTAB without rales or wheezes. Abdominal: Abdomen soft and non-tender.  No distension or HSM.   Neuro/Psych: Strength symmetric in upper and lower extremities.  Judgment and insight appear normal.   Condition at discharge: good  The results of significant diagnostics from this hospitalization (including imaging, microbiology, ancillary and laboratory) are listed below for reference.   Imaging Studies: CT ABDOMEN PELVIS W CONTRAST  Result Date: 11/29/2021 CLINICAL DATA:  Abdominal pain. EXAM: CT ABDOMEN AND PELVIS WITH CONTRAST TECHNIQUE: Multidetector CT imaging of the abdomen and pelvis was performed using the standard protocol following bolus administration of intravenous contrast. RADIATION DOSE REDUCTION: This exam was performed according to the departmental dose-optimization program which includes automated exposure control, adjustment of the mA and/or kV according to patient size and/or use of iterative reconstruction technique. CONTRAST:  44mL OMNIPAQUE IOHEXOL 300 MG/ML  SOLN COMPARISON:  CT abdomen and pelvis 09/18/2005 FINDINGS: Lower chest: No acute abnormality. Hepatobiliary: No focal liver  abnormality is seen. Status post cholecystectomy. No biliary dilatation. Pancreas: Unremarkable. No pancreatic ductal dilatation or surrounding inflammatory changes. Spleen: Normal in size without focal abnormality. Adrenals/Urinary Tract: Adrenal glands are unremarkable. Kidneys are normal, without renal calculi, focal lesion, or hydronephrosis. Bladder is unremarkable. There is limited evaluation of the distal ureters and bladder secondary to streak artifact in the pelvis. Stomach/Bowel: Stomach is within normal limits. Appendix appears normal. No evidence of bowel wall thickening, distention, or inflammatory changes. There is sigmoid colon diverticulosis. Vascular/Lymphatic: Aortic atherosclerosis. No enlarged abdominal or pelvic lymph nodes. Reproductive: Status post hysterectomy. No adnexal masses. Other: No abdominal wall hernia or abnormality. No abdominopelvic ascites. Musculoskeletal: Left hip arthroplasty is present. There is some subcutaneous scarring and stranding anterior to the left hip L4-S1 posterior fusion hardware is present. Stimulator device and catheter are partially visualized in this region in the posterior soft tissues. Evaluation is difficult secondary to streak artifact. IMPRESSION: 1. No acute localizing process in the abdomen or pelvis. 2. Sigmoid colon diverticulosis. 3.  Aortic Atherosclerosis (ICD10-I70.0). Electronically Signed   By: Ronney Asters M.D.   On: 11/29/2021 23:55   CT HEAD WO CONTRAST (5MM)  Result Date: 11/29/2021 CLINICAL DATA:  Mental status change EXAM: CT HEAD WITHOUT CONTRAST TECHNIQUE: Contiguous axial images were obtained from the base of the skull through the vertex without intravenous contrast. RADIATION DOSE REDUCTION: This exam was performed according to the departmental dose-optimization program which includes automated exposure control, adjustment of the mA and/or kV according to patient size and/or use of iterative reconstruction technique. COMPARISON:   CT 12/23/2006 FINDINGS: Brain: No intracranial hemorrhage, mass effect, or evidence of acute infarct. No hydrocephalus. No extra-axial fluid collection. Generalized cerebral atrophy. Ill-defined hypoattenuation within the cerebral white matter is nonspecific but consistent with chronic small vessel ischemic disease. Vascular: No hyperdense vessel. Calcification of the intracranial internal carotid arteries. Skull: No fracture or focal lesion. Sinuses/Orbits: No acute finding. Other: None. IMPRESSION: No acute intracranial abnormality. Chronic small vessel ischemic change. Electronically Signed   By: Placido Sou M.D.   On: 11/29/2021 23:10   DG Chest Portable 1 View  Result Date: 11/29/2021 CLINICAL DATA:  Altered mental status EXAM: PORTABLE CHEST 1 VIEW COMPARISON:  11/13/2021 FINDINGS: Cardiac shadow is within normal limits. The lungs are well aerated bilaterally. No focal infiltrate or effusion is seen. No bony  abnormality is noted. IMPRESSION: No active disease. Electronically Signed   By: Alcide Clever M.D.   On: 11/29/2021 22:28   DG Chest 2 View  Result Date: 11/14/2021 CLINICAL DATA:  Short of breath. History of hypertension and smoking. EXAM: CHEST - 2 VIEW COMPARISON:  02/18/2016. FINDINGS: Cardiac silhouette is normal in size. No mediastinal or hilar masses. No adenopathy. Clear lungs. No pleural effusion or pneumothorax. Skeletal structures are intact. IMPRESSION: No active cardiopulmonary disease. Electronically Signed   By: Amie Portland M.D.   On: 11/14/2021 13:02    Microbiology: Results for orders placed or performed during the hospital encounter of 11/29/21  Blood culture (routine x 2)     Status: None (Preliminary result)   Collection Time: 11/29/21 10:16 PM   Specimen: BLOOD  Result Value Ref Range Status   Specimen Description BLOOD LEFT FA  Final   Special Requests   Final    BOTTLES DRAWN AEROBIC AND ANAEROBIC Blood Culture results may not be optimal due to an inadequate  volume of blood received in culture bottles   Culture   Final    NO GROWTH < 12 HOURS Performed at [facility], [address] [place], [place] [address]    Report Status PENDING  Incomplete  Blood culture (routine x 2)     Status: None (Preliminary result)   Collection Time: 11/29/21 10:17 PM   Specimen: BLOOD  Result Value Ref Range Status   Specimen Description BLOOD RIGHT FA  Final   Special Requests   Final    BOTTLES DRAWN AEROBIC AND ANAEROBIC Blood Culture results may not be optimal due to an inadequate volume of blood received in culture bottles   Culture   Final    NO GROWTH < 12 HOURS Performed at [facility], [address], [place], [place] [address]    Report Status PENDING  Incomplete  SARS Coronavirus 2 by RT PCR (hospital order, performed in [facility] hospital lab) *cepheid single result test* Anterior Nasal Swab     Status: Abnormal   Collection Time: 11/30/21 12:27 AM   Specimen: Anterior Nasal Swab  Result Value Ref Range Status   SARS Coronavirus 2 by RT PCR POSITIVE (A) NEGATIVE Final    Comment: (NOTE) SARS-CoV-2 target nucleic acids are DETECTED  SARS-CoV-2 RNA is generally detectable in upper respiratory specimens  during the acute phase of infection.  Positive results are indicative  of the presence of the identified virus, but do not rule out bacterial infection or co-infection with other pathogens not detected by the test.  Clinical correlation with patient history and  other diagnostic information is necessary to determine patient infection status.  The expected result is negative.  Fact Sheet for Patients:   RoadLapTop.co.za   Fact Sheet for Healthcare Providers:   http://kim-miller.com/    This test is not yet approved or cleared by the [place] FDA and  has been authorized for detection and/or diagnosis of SARS-CoV-2 by FDA under an Emergency Use Authorization (EUA).   This EUA will remain in effect (meaning this test can be used) for the duration of  the COVID-19 declaration under Section 564(b)(1)  of the Act, 21 U.S.C. section 360-bbb-3(b)(1), unless the authorization is terminated or revoked sooner.   Performed at [facility], [address] [place], [place] [address]     Labs: CBC: Recent Labs  Lab 11/29/21 2205 11/30/21 0540  WBC 5.0 5.6  NEUTROABS 3.8  --   HGB  11.0* 9.7*  HCT 35.9* 31.8*  MCV 89.3 89.6  PLT 348 282   Basic Metabolic Panel: Recent Labs  Lab 11/29/21 2205  NA 135  K 4.2  CL 97*  CO2 29  GLUCOSE 282*  BUN 24*  CREATININE 1.31*  CALCIUM 8.8*   Liver Function Tests: Recent Labs  Lab 11/29/21 2205  AST 31  ALT 16  ALKPHOS 97  BILITOT 0.6  PROT 7.8  ALBUMIN 3.4*   CBG: Recent Labs  Lab 11/29/21 2154 11/30/21 0820 11/30/21 1204  GLUCAP 295* 260* 184*    Discharge time spent: approximately 35 minutes spent on discharge counseling, evaluation of patient on day of discharge, and coordination of discharge planning with nursing, social work, pharmacy and case management  Signed: Alberteen Sam, MD [facility] 11/30/2021

## 2021-11-30 NOTE — Assessment & Plan Note (Signed)
BMI 34 

## 2021-11-30 NOTE — Assessment & Plan Note (Addendum)
At baseline she has no cognitive impairment, lives alone.  At presentation in the ER, she was oreinted to self only.  Family reported she was disoriented and hallucinating at home.
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
## 2021-11-30 NOTE — Assessment & Plan Note (Signed)
-   We will continue PPI therapy 

## 2021-12-01 LAB — URINE CULTURE: Culture: 40000 — AB

## 2021-12-04 LAB — CULTURE, BLOOD (ROUTINE X 2)
Culture: NO GROWTH
Culture: NO GROWTH

## 2021-12-08 ENCOUNTER — Telehealth: Payer: Self-pay

## 2021-12-08 NOTE — Progress Notes (Signed)
Chronic Care Management Pharmacy Assistant   Name: Julie Keith  MRN: [id number] DOB: 27-Dec-1945  Reason for Encounter: ER Follow-up   I contacted the patient as she was recently in the ER due to COVID. Per the ER note patient was experiencing some Hypertension, nausea, vomiting, diarrhea, confusion, and some hallucinations that landed her in the ER. Patient was diagnosed with COVID and given  Nirmatrelvir-Ritonavir 10 x 150 MG & 10 x 100MG  2 tablets Oral 2 times daily, Patient GFR is 41. Take nirmatrelvir (150 mg) one tablet twice daily for 5 days and ritonavir (100 mg) one tablet twice daily for 5 days.  I spoke with the patient today, and she reports that she is feeling good. Per patient she denies any ill symptoms or any of the symptoms that landed her in the ER. Patient reports that she is doing much better. Patient advised she has resumed her Physical Therapy, and they do check her vitals and all her vitals have been normal. Patient advised that the medication she was given upon discharge from the ER she has completed. Patient stated she does have a PCP follow-up on the 12/15/2021. Patient hasn't rechecked to see if she is now negative, but she doesn't have any symptoms regarding COVID. Patient has no concerns or issues today.   Medications: Outpatient Encounter Medications as of 12/08/2021  Medication Sig   acetaminophen (TYLENOL) 500 MG tablet Take 1,000 mg by mouth every 8 (eight) hours as needed for moderate pain.   albuterol (VENTOLIN HFA) 108 (90 Base) MCG/ACT inhaler Inhale 2 puffs into the lungs every 6 (six) hours as needed for wheezing or shortness of breath.   Apremilast (OTEZLA) 30 MG TABS Take 30 mg by mouth in the morning and at bedtime.   aspirin 81 MG chewable tablet Chew 1 tablet (81 mg total) by mouth 2 (two) times daily.   benzonatate (TESSALON) 100 MG capsule Take 1-2 capsules (100-200 mg total) by mouth 3 (three) times daily as needed for cough.   carvedilol  (COREG) 6.25 MG tablet Take 1 tablet (6.25 mg total) by mouth 2 (two) times daily.   cetirizine (ZYRTEC) 10 MG tablet Take 1 tablet (10 mg total) by mouth daily.   enalapril (VASOTEC) 20 MG tablet Take 1 tablet (20 mg total) by mouth 2 (two) times daily.   EQUATE STOOL SOFTENER 100 MG capsule Take 100 mg by mouth 2 (two) times daily.   fluticasone (FLONASE) 50 MCG/ACT nasal spray Place 2 sprays into both nostrils daily.   Fluticasone-Umeclidin-Vilant (TRELEGY ELLIPTA) 100-62.5-25 MCG/ACT AEPB Inhale 1 puff into the lungs daily.   glimepiride (AMARYL) 4 MG tablet Take 1 tablet (4 mg total) by mouth daily with breakfast.   glucose blood (ONETOUCH ULTRA) test strip USE 1 STRIP TO CHECK GLUCOSE IN THE MORNING, AT NOON, AND AT BEDTIME.   hydrochlorothiazide (HYDRODIURIL) 25 MG tablet Take 1 tablet (25 mg total) by mouth daily.   ipratropium-albuterol (DUONEB) 0.5-2.5 (3) MG/3ML SOLN Take 3 mLs by nebulization every 6 (six) hours as needed (cough, wheeze, shortness of breath).   nystatin cream (MYCOSTATIN) Apply topically.   OXYGEN Inhale 2 L into the lungs at bedtime. Uses APRIA at night   pantoprazole (PROTONIX) 20 MG tablet Take 1 tablet (20 mg total) by mouth daily.   pravastatin (PRAVACHOL) 80 MG tablet Take 1 tablet (80 mg total) by mouth daily.   No facility-administered encounter medications on file as of 12/08/2021.    12/10/2021, CPA/CMA Clinical  Pharmacist Assistant Phone: [phone]

## 2021-12-08 NOTE — Progress Notes (Signed)
Note opened in Error please void

## 2021-12-09 ENCOUNTER — Ambulatory Visit: Payer: Medicare Other | Admitting: Internal Medicine

## 2021-12-10 ENCOUNTER — Ambulatory Visit: Payer: Medicare Other | Admitting: Internal Medicine

## 2021-12-16 ENCOUNTER — Ambulatory Visit (INDEPENDENT_AMBULATORY_CARE_PROVIDER_SITE_OTHER): Payer: Medicare Other | Admitting: Nurse Practitioner

## 2021-12-16 ENCOUNTER — Encounter: Payer: Self-pay | Admitting: Nurse Practitioner

## 2021-12-16 VITALS — BP 134/78 | HR 87 | Temp 98.3°F | Resp 16 | Ht 65.0 in | Wt 207.0 lb

## 2021-12-16 DIAGNOSIS — G9341 Metabolic encephalopathy: Secondary | ICD-10-CM

## 2021-12-16 DIAGNOSIS — J441 Chronic obstructive pulmonary disease with (acute) exacerbation: Secondary | ICD-10-CM | POA: Diagnosis not present

## 2021-12-16 DIAGNOSIS — Z09 Encounter for follow-up examination after completed treatment for conditions other than malignant neoplasm: Secondary | ICD-10-CM

## 2021-12-16 DIAGNOSIS — U071 COVID-19: Secondary | ICD-10-CM

## 2021-12-16 DIAGNOSIS — I10 Essential (primary) hypertension: Secondary | ICD-10-CM | POA: Diagnosis not present

## 2021-12-16 DIAGNOSIS — R21 Rash and other nonspecific skin eruption: Secondary | ICD-10-CM

## 2021-12-16 MED ORDER — FAMOTIDINE 20 MG PO TABS: 20.0000 mg | ORAL_TABLET | Freq: Two times a day (BID) | ORAL | 0 refills | Status: DC

## 2021-12-16 NOTE — Assessment & Plan Note (Signed)
Blood pressure today is 134/78.  Patient is to continue taking her blood pressure medication which includes hydrochlorothiazide 25 mg daily, Vasotec 20 mg 2 times daily, Coreg 6.25 mg 2 times a day.

## 2021-12-16 NOTE — Progress Notes (Signed)
BP 134/78   Pulse 87   Temp 98.3 F (36.8 C) (Oral)   Resp 16   Ht 5\' 5"  (1.651 m)   Wt 207 lb (93.9 kg)   SpO2 95%   BMI 34.45 kg/m    Subjective:    Patient ID: , female    DOB: [birth year], 76 y.o.   MRN: [id number]  HPI: Julie Keith is a 76 y.o. female  Chief Complaint  Patient presents with   Hospitalization Follow-up    COVID   Rash    Arms itches bad   Hospital follow up/Covid/hypertension/acute encephalopathy: Patient went to the emergency department on 11/29/2021 at [facility].  She was seen for altered mental status, nausea, vomiting, diarrhea, cough over the last several days.  Prior to her visit at the emergency department she was seen by [facility] her PCP.  She was initially being treated for bronchitis.  She was started on a steroid taper and given Tessalon Perles.  Patient reports after seeing PCP she had gotten worse.  Her family came to check on her and said that she was confused, hallucinating, asking for family members that had already died.  So they took her to the hospital.  She did test positive for COVID.  They did a CT of her head which showedNo acute intracranial abnormality. Chronic small vessel ischemic change.  Also did a CT of her abdomen which showed1. No acute localizing process in the abdomen or pelvis.2. Sigmoid colon diverticulosis.3.  Aortic Atherosclerosis (ICD10-I70.0).  Chest x-ray showed No active disease. Upon presenting to the emergency room, BP was 127/73 and later 147/135 with pulse oximetry of 99% on 2 L of O2 by nasal cannula with otherwise normal vital signs.  Labs revealed BUN of 24 with creatinine 1.31 better than previous levels with chloride of 97 and glucose of 282, albumin of 3.4.  Lactic acid was 2.5 and CBC showed mild anemia worse than previous levels.  EKG showed normal sinus rhythm with a rate of 67 with borderline intraventricular conduction delay.  Patient was given 3 DuoNebs in the emergency department  and a liter of normal saline.  She was also started on Paxlovid steroid therapy.  She was admitted to the telemetry unit.  She was discharged on 11/30/2021.  She was instructed to complete Paxlovid and steroid pack. Patient reports she is feeling much better. She says she has completed her Paxlovid and steroid taper.  Patient continues to wear nasal cannula 2 L of oxygen.  She states she does have a pulmonology appointment coming up in September.  Rash:  She says she has had a rash for a few weeks.  She has a rash on her face by her hair line, arms and abdomen.  She says she has not changed any soaps or detergents.  She says this has never happened to her before.  She has tried hydrocortisone cream and benadryl cream.  Patient has a prescription for Zyrtec but has not been taking it.  Discussed with patient to taking Zyrtec and Pepcid.  Rash does look allergic in nature.   Relevant past medical, surgical, family and social history reviewed and updated as indicated. Interim medical history since our last visit reviewed. Allergies and medications reviewed and updated.  Review of Systems  Constitutional: Negative for fever or weight change.  Respiratory: Negative for cough and shortness of breath.   Cardiovascular: Negative for chest pain or palpitations.  Gastrointestinal: Negative for abdominal pain, no  bowel changes.  Musculoskeletal: Negative for gait problem or joint swelling.  Skin: Positive for rash.  Neurological: Negative for dizziness or headache.  No other specific complaints in a complete review of systems (except as listed in HPI above).      Objective:    BP 134/78   Pulse 87   Temp 98.3 F (36.8 C) (Oral)   Resp 16   Ht 5\' 5"  (1.651 m)   Wt 207 lb (93.9 kg)   SpO2 95%   BMI 34.45 kg/m   Wt Readings from Last 3 Encounters:  12/16/21 207 lb (93.9 kg)  11/30/21 209 lb 7 oz (95 kg)  11/13/21 209 lb 6.4 oz (95 kg)    Physical Exam  Constitutional: Patient appears  well-developed and well-nourished. Obese  No distress.  HEENT: head atraumatic, normocephalic, pupils equal and reactive to light, neck supple Cardiovascular: Normal rate, regular rhythm and normal heart sounds.  No murmur heard. No BLE edema. Pulmonary/Chest: Effort normal and breath sounds normal. No respiratory distress. Abdominal: Soft.  There is no tenderness. Skin: rash noted to face ( at hair line), arms and abdomen Psychiatric: Patient has a normal mood and affect. behavior is normal. Judgment and thought content normal.  Results for orders placed or performed during the hospital encounter of 11/29/21  Blood culture (routine x 2)   Specimen: BLOOD  Result Value Ref Range   Specimen Description BLOOD LEFT FA    Special Requests      BOTTLES DRAWN AEROBIC AND ANAEROBIC Blood Culture results may not be optimal due to an inadequate volume of blood received in culture bottles   Culture      NO GROWTH 5 DAYS Performed at [facility], [address], [place], [place]    Report Status 12/04/2021 FINAL   Blood culture (routine x 2)   Specimen: BLOOD  Result Value Ref Range   Specimen Description BLOOD RIGHT FA    Special Requests      BOTTLES DRAWN AEROBIC AND ANAEROBIC Blood Culture results may not be optimal due to an inadequate volume of blood received in culture bottles   Culture      NO GROWTH 5 DAYS Performed at [facility], [address], [place], [place]    Report Status 12/04/2021 FINAL   Urine Culture   Specimen: Urine, Clean Catch  Result Value Ref Range   Specimen Description      URINE, CLEAN CATCH Performed at [facility], [address], [place], [place]    Special Requests      NONE Performed at [facility], [address] [place], [place]    Culture (A)     40,000 COLONIES/mL MULTIPLE SPECIES PRESENT, SUGGEST RECOLLECTION   Report Status 12/01/2021 FINAL   SARS Coronavirus  2 by RT PCR (hospital order, performed in [facility] hospital lab) *cepheid single result test* Anterior Nasal Swab   Specimen: Anterior Nasal Swab  Result Value Ref Range   SARS Coronavirus 2 by RT PCR POSITIVE (A) NEGATIVE  CBC with Differential  Result Value Ref Range   WBC 5.0 4.0 - 10.5 K/uL   RBC 4.02 3.87 - 5.11 MIL/uL   Hemoglobin 11.0 (L) 12.0 - 15.0 g/dL   H[facility] (L) 65.7 - 84.6 %   MCV 89.3 80.0 - 100.0 fL   MCH 27.4 26.0 - 34.0 pg   MCHC 30.6 30.0 - 36.0 g/dL   RDW 96.2 95.2 -  15.5 %   Platelets 348 150 - 400 K/uL   nRBC 0.0 0.0 - 0.2 %   Neutrophils Relative % 77 %   Neutro Abs 3.8 1.7 - 7.7 K/uL   Lymphocytes Relative 19 %   Lymphs Abs 1.0 0.7 - 4.0 K/uL   Monocytes Relative 3 %   Monocytes Absolute 0.2 0.1 - 1.0 K/uL   Eosinophils Relative 0 %   Eosinophils Absolute 0.0 0.0 - 0.5 K/uL   Basophils Relative 0 %   Basophils Absolute 0.0 0.0 - 0.1 K/uL   Immature Granulocytes 1 %   Abs Immature Granulocytes 0.03 0.00 - 0.07 K/uL  Comprehensive metabolic panel  Result Value Ref Range   Sodium 135 135 - 145 mmol/L   Potassium 4.2 3.5 - 5.1 mmol/L   Chloride 97 (L) 98 - 111 mmol/L   CO2 29 22 - 32 mmol/L   Glucose, Bld 282 (H) 70 - 99 mg/dL   BUN 24 (H) 8 - 23 mg/dL   Creatinine, Ser 0.10 (H) 0.44 - 1.00 mg/dL   Calcium 8.8 (L) 8.9 - 10.3 mg/dL   Total Protein 7.8 6.5 - 8.1 g/dL   Albumin 3.4 (L) 3.5 - 5.0 g/dL   AST 31 15 - 41 U/L   ALT 16 0 - 44 U/L   Alkaline Phosphatase 97 38 - 126 U/L   Total Bilirubin 0.6 0.3 - 1.2 mg/dL   GFR, Estimated 42 (L) >60 mL/min   Anion gap 9 5 - 15  Lactic acid, plasma  Result Value Ref Range   Lactic Acid, Venous 2.5 (HH) 0.5 - 1.9 mmol/L  Lactic acid, plasma  Result Value Ref Range   Lactic Acid, Venous 0.9 0.5 - 1.9 mmol/L  Blood gas, venous  Result Value Ref Range   pH, Ven 7.47 (H) 7.25 - 7.43   pCO2, Ven 47 44 - 60 mmHg   pO2, Ven 61 (H) 32 - 45 mmHg   Bicarbonate 34.2 (H) 20.0 - 28.0 mmol/L   Acid-Base  Excess 9.1 (H) 0.0 - 2.0 mmol/L   O2 Saturation 93.4 %   Patient temperature 37.0    Collection site VEIN    Drawn by VENOUS   Urinalysis, Routine w reflex microscopic  Result Value Ref Range   Color, Urine YELLOW (A) YELLOW   APPearance CLEAR (A) CLEAR   Specific Gravity, Urine 1.018 1.005 - 1.030   pH 7.0 5.0 - 8.0   Glucose, UA 150 (A) NEGATIVE mg/dL   Hgb urine dipstick NEGATIVE NEGATIVE   Bilirubin Urine NEGATIVE NEGATIVE   Ketones, ur NEGATIVE NEGATIVE mg/dL   Protein, ur 30 (A) NEGATIVE mg/dL   Nitrite NEGATIVE NEGATIVE   Leukocytes,Ua NEGATIVE NEGATIVE   RBC / HPF 0-5 0 - 5 RBC/hpf   WBC, UA 0-5 0 - 5 WBC/hpf   Bacteria, UA NONE SEEN NONE SEEN   Squamous Epithelial / LPF 0-5 0 - 5   Hyaline Casts, UA PRESENT   CBC  Result Value Ref Range   WBC 5.6 4.0 - 10.5 K/uL   RBC 3.55 (L) 3.87 - 5.11 MIL/uL   Hemoglobin 9.7 (L) 12.0 - 15.0 g/dL   HCT 27.2 (L) 53.6 - 64.4 %   MCV 89.6 80.0 - 100.0 fL   MCH 27.3 26.0 - 34.0 pg   MCHC 30.5 30.0 - 36.0 g/dL   RDW 03.4 74.2 - 59.5 %   Platelets 282 150 - 400 K/uL   nRBC 0.0 0.0 - 0.2 %  C-reactive  protein  Result Value Ref Range   CRP 2.6 (H) <1.0 mg/dL  D-dimer, quantitative  Result Value Ref Range   D-Dimer, Quant 1.49 (H) 0.00 - 0.50 ug/mL-FEU  Ferritin  Result Value Ref Range   Ferritin 104 11 - 307 ng/mL  Lactate dehydrogenase  Result Value Ref Range   LDH 131 98 - 192 U/L  Procalcitonin  Result Value Ref Range   Procalcitonin <0.10 ng/mL  CBG monitoring, ED  Result Value Ref Range   Glucose-Capillary 295 (H) 70 - 99 mg/dL  CBG monitoring, ED  Result Value Ref Range   Glucose-Capillary 260 (H) 70 - 99 mg/dL  CBG monitoring, ED  Result Value Ref Range   Glucose-Capillary 184 (H) 70 - 99 mg/dL  Troponin I (High Sensitivity)  Result Value Ref Range   Troponin I (High Sensitivity) 27 (H) <18 ng/L  Troponin I (High Sensitivity)  Result Value Ref Range   Troponin I (High Sensitivity) 26 (H) <18 ng/L       Assessment & Plan:   Problem List Items Addressed This Visit       Cardiovascular and Mediastinum   Essential hypertension (Chronic)    Blood pressure today is 134/78.  Patient is to continue taking her blood pressure medication which includes hydrochlorothiazide 25 mg daily, Vasotec 20 mg 2 times daily, Coreg 6.25 mg 2 times a day.        Nervous and Auditory   Acute metabolic encephalopathy   Other Visit Diagnoses     COVID-19    -  Primary   She reports she is feeling much better.  She completed Paxlovid and steroid pack.   COPD with acute exacerbation (HCC)       She reports she is feeling much better.  She completed Paxlovid and steroid pack.  Continue using Trelegy daily.  Keep appointment with pulmonology.   Hospital discharge follow-up       Patient completed discharge treatment.  Patient reports she is feeling much better.   Rash       Discussed with patient this is likely allergic in nature.  Recommend she take Zyrtec and Pepcid.   Relevant Medications   famotidine (PEPCID) 20 MG tablet        Follow up plan: No follow-ups on file.

## 2021-12-17 DIAGNOSIS — I129 Hypertensive chronic kidney disease with stage 1 through stage 4 chronic kidney disease, or unspecified chronic kidney disease: Secondary | ICD-10-CM | POA: Diagnosis not present

## 2021-12-17 DIAGNOSIS — L409 Psoriasis, unspecified: Secondary | ICD-10-CM | POA: Diagnosis not present

## 2021-12-17 DIAGNOSIS — I491 Atrial premature depolarization: Secondary | ICD-10-CM | POA: Diagnosis not present

## 2021-12-17 DIAGNOSIS — Z7984 Long term (current) use of oral hypoglycemic drugs: Secondary | ICD-10-CM | POA: Diagnosis not present

## 2021-12-17 DIAGNOSIS — J439 Emphysema, unspecified: Secondary | ICD-10-CM | POA: Diagnosis not present

## 2021-12-17 DIAGNOSIS — Z87891 Personal history of nicotine dependence: Secondary | ICD-10-CM | POA: Diagnosis not present

## 2021-12-17 DIAGNOSIS — Z96642 Presence of left artificial hip joint: Secondary | ICD-10-CM | POA: Diagnosis not present

## 2021-12-17 DIAGNOSIS — E7800 Pure hypercholesterolemia, unspecified: Secondary | ICD-10-CM | POA: Diagnosis not present

## 2021-12-17 DIAGNOSIS — E1122 Type 2 diabetes mellitus with diabetic chronic kidney disease: Secondary | ICD-10-CM | POA: Diagnosis not present

## 2021-12-17 DIAGNOSIS — Z96652 Presence of left artificial knee joint: Secondary | ICD-10-CM | POA: Diagnosis not present

## 2021-12-17 DIAGNOSIS — Z471 Aftercare following joint replacement surgery: Secondary | ICD-10-CM | POA: Diagnosis not present

## 2021-12-17 DIAGNOSIS — K219 Gastro-esophageal reflux disease without esophagitis: Secondary | ICD-10-CM | POA: Diagnosis not present

## 2021-12-17 DIAGNOSIS — N189 Chronic kidney disease, unspecified: Secondary | ICD-10-CM | POA: Diagnosis not present

## 2021-12-17 DIAGNOSIS — J309 Allergic rhinitis, unspecified: Secondary | ICD-10-CM | POA: Diagnosis not present

## 2021-12-17 DIAGNOSIS — Z9181 History of falling: Secondary | ICD-10-CM | POA: Diagnosis not present

## 2021-12-17 DIAGNOSIS — M1990 Unspecified osteoarthritis, unspecified site: Secondary | ICD-10-CM | POA: Diagnosis not present

## 2021-12-25 ENCOUNTER — Ambulatory Visit: Payer: Medicare Other | Admitting: Internal Medicine

## 2021-12-30 DIAGNOSIS — L309 Dermatitis, unspecified: Secondary | ICD-10-CM | POA: Diagnosis not present

## 2021-12-30 DIAGNOSIS — L400 Psoriasis vulgaris: Secondary | ICD-10-CM | POA: Diagnosis not present

## 2021-12-31 ENCOUNTER — Telehealth: Payer: Self-pay | Admitting: Internal Medicine

## 2021-12-31 NOTE — Telephone Encounter (Signed)
Left vm and sent mychart message to confirm 01/07/22 appointment-Toni 

## 2022-01-07 ENCOUNTER — Ambulatory Visit: Payer: Medicare Other | Admitting: Internal Medicine

## 2022-01-07 DIAGNOSIS — R0602 Shortness of breath: Secondary | ICD-10-CM

## 2022-01-08 ENCOUNTER — Ambulatory Visit (INDEPENDENT_AMBULATORY_CARE_PROVIDER_SITE_OTHER): Payer: Medicare Other

## 2022-01-08 DIAGNOSIS — Z0000 Encounter for general adult medical examination without abnormal findings: Secondary | ICD-10-CM
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
## 2022-01-10 NOTE — Procedures (Signed)
[facility] [address] [place], [address]    Complete Pulmonary Function Testing Interpretation:  FINDINGS:  Forced vital capacity is moderately decreased FEV1 is 0.96 L which is 47% of predicted and is severely decreased.  Postbronchodilator no significant change in FEV1 is noted.  FEV1 FVC ratio was moderately decreased.  Total lung capacity is normal residual volume is increased residual volume total lung capacity ratio is increased FRC is decreased.  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

## 2022-01-16 LAB — PULMONARY FUNCTION TEST

## 2022-01-19 ENCOUNTER — Ambulatory Visit: Payer: Medicare Other | Admitting: Internal Medicine

## 2022-01-19 ENCOUNTER — Encounter: Payer: Self-pay | Admitting: Internal Medicine

## 2022-01-19 VITALS — BP 174/62 | HR 67 | Temp 98.1°F | Resp 16 | Ht 65.0 in | Wt 204.2 lb

## 2022-01-19 DIAGNOSIS — J4489 Other specified chronic obstructive pulmonary disease: Secondary | ICD-10-CM | POA: Diagnosis not present

## 2022-01-19 DIAGNOSIS — J9611 Chronic respiratory failure with hypoxia: Secondary | ICD-10-CM | POA: Diagnosis not present

## 2022-01-19 NOTE — Patient Instructions (Signed)
Chronic Obstructive Pulmonary Disease  Chronic obstructive pulmonary disease (COPD) is a long-term (chronic) lung problem. When you have COPD, it is hard for air to get in and out of your lungs. Usually the condition gets worse over time, and your lungs will never return to normal. There are things you can do to keep yourself as healthy as possible. What are the causes? Smoking. This is the most common cause. Certain genes passed from parent to child (inherited). What increases the risk? Being exposed to secondhand smoke from cigarettes, pipes, or cigars. Being exposed to chemicals and other irritants, such as fumes and dust in the work environment. Having chronic lung conditions or infections. What are the signs or symptoms? Shortness of breath, especially during physical activity. A long-term cough with a large amount of thick mucus. Sometimes, the cough may not have any mucus (dry cough). Wheezing. Breathing quickly. Skin that looks gray or blue, especially in the fingers, toes, or lips. Feeling tired (fatigue). Weight loss. Chest tightness. Having infections often. Episodes when breathing symptoms become much worse (exacerbations). At the later stages of this disease, you may have swelling in the ankles, feet, or legs. How is this treated? Taking medicines. Quitting smoking, if you smoke. Rehabilitation. This includes steps to make your body work better. It may involve a team of specialists. Doing exercises. Making changes to your diet. Using oxygen. Lung surgery. Lung transplant. Comfort measures (palliative care). Follow these instructions at home: Medicines Take over-the-counter and prescription medicines only as told by your doctor. Talk to your doctor before taking any cough or allergy medicines. You may need to avoid medicines that cause your lungs to be dry. Lifestyle If you smoke, stop smoking. Smoking makes the problem worse. Do not smoke or use any products that  contain nicotine or tobacco. If you need help quitting, ask your doctor. Avoid being around things that make your breathing worse. This may include smoke, chemicals, and fumes. Stay active, but remember to rest as well. Learn and use tips on how to manage stress and control your breathing. Make sure you get enough sleep. Most adults need at least 7 hours of sleep every night. Eat healthy foods. Eat smaller meals more often. Rest before meals. Controlled breathing Learn and use tips on how to control your breathing as told by your doctor. Try: Breathing in (inhaling) through your nose for 1 second. Then, pucker your lips and breath out (exhale) through your lips for 2 seconds. Putting one hand on your belly (abdomen). Breathe in slowly through your nose for 1 second. Your hand on your belly should move out. Pucker your lips and breathe out slowly through your lips. Your hand on your belly should move in as you breathe out.  Controlled coughing Learn and use controlled coughing to clear mucus from your lungs. Follow these steps: Lean your head a little forward. Breathe in deeply. Try to hold your breath for 3 seconds. Keep your mouth slightly open while coughing 2 times. Spit any mucus out into a tissue. Rest and do the steps again 1 or 2 times as needed. General instructions Make sure you get all the shots (vaccines) that your doctor recommends. Ask your doctor about a flu shot and a pneumonia shot. Use oxygen therapy and pulmonary rehabilitation if told by your doctor. If you need home oxygen therapy, ask your doctor if you should buy a tool to measure your oxygen level (oximeter). Make a COPD action plan with your doctor. This helps you   to know what to do if you feel worse than usual. Manage any other conditions you have as told by your doctor. Avoid going outside when it is very hot, cold, or humid. Avoid people who have a sickness you can catch (contagious). Keep all follow-up  visits. Contact a doctor if: You cough up more mucus than usual. There is a change in the color or thickness of the mucus. It is harder to breathe than usual. Your breathing is faster than usual. You have trouble sleeping. You need to use your medicines more often than usual. You have trouble doing your normal activities such as getting dressed or walking around the house. Get help right away if: You have shortness of breath while resting. You have shortness of breath that stops you from: Being able to talk. Doing normal activities. Your chest hurts for longer than 5 minutes. Your skin color is more blue than usual. Your pulse oximeter shows that you have low oxygen for longer than 5 minutes. You have a fever. You feel too tired to breathe normally. These symptoms may represent a serious problem that is an emergency. Do not wait to see if the symptoms will go away. Get medical help right away. Call your local emergency services (911 in the [place] Do not drive yourself to the hospital. Summary Chronic obstructive pulmonary disease (COPD) is a long-term lung problem. The way your lungs work will never return to normal. Usually the condition gets worse over time. There are things you can do to keep yourself as healthy as possible. Take over-the-counter and prescription medicines only as told by your doctor. If you smoke, stop. Smoking makes the problem worse. This information is not intended to replace advice given to you by your health care provider. Make sure you discuss any questions you have with your health care provider. Document Revised: 02/13/2020 Document Reviewed: 02/13/2020 [employer] Patient Education  2023 [employer]  

## 2022-01-19 NOTE — Progress Notes (Signed)
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   Diagnosis Date Noted   COPD exacerbation (HCC) 11/30/2021   COVID-19 virus infection 11/30/2021   Dyslipidemia 11/30/2021   Acute metabolic encephalopathy 11/30/2021   Normocytic anemia 11/30/2021   COVID 11/30/2021   Candidiasis of skin 11/11/2021   History of total hip  replacement, left 10/15/2021   History of revision of total replacement of left hip joint 10/15/2021   Osteoarthritis of left hip 09/04/2021   Leukocytosis 08/22/2021   Immunosuppression due to drug therapy (HCC) 03/11/2021   History of total knee replacement, left 06/17/2020   Osteoarthritis of knee 03/09/2020   Osteoarthritis of left knee 02/28/2020   Stage 3b chronic kidney disease (CKD) (HCC) 09/21/2019   Thrombocytosis 04/07/2017   Psoriasis 10/10/2015   GERD (gastroesophageal reflux disease) 10/10/2015   Type 2 diabetes mellitus without complication, without long-term current use of insulin (HCC) 10/31/2014   Essential hypertension 10/31/2014   Hyperlipidemia 10/31/2014   Obesity (BMI 30-39.9) 10/31/2014   Pulmonary emphysema (HCC) 06/28/2014   Chronic respiratory failure (HCC) 06/28/2014    1. Obstructive chronic bronchitis without exacerbation Patient has severe obstructive lung disease reviewed previous pulmonary functions also.  The patient appears to be relatively stable.  Plan is going to be to continue with current inhaler regimen.  In addition to that we will titrate oxygen as needed.  2. Chronic respiratory failure with hypoxia (HCC) She uses 2lpm of oxygen mainly at night  3. Obesity, morbid (HCC) Obesity Counseling: Had a lengthy discussion regarding patients BMI and weight issues. Patient was instructed on portion control as well as increased activity. Also discussed caloric restrictions with trying to maintain intake less than 2000 Kcal. Discussions were made in accordance  with the 5As of weight management. Simple actions such as not eating late and if able to, taking a walk is suggested.    General Counseling: I have discussed the findings of the evaluation and examination with [facility].  I have also discussed any further diagnostic evaluation thatmay be needed or ordered today. Aramis verbalizes understanding of the findings of todays visit. We also reviewed her  medications today and discussed drug interactions and side effects including but not limited excessive drowsiness and altered mental states. We also discussed that there is always a risk not just to her but also people around her. she has been encouraged to call the office with any questions or concerns that should arise related to todays visit.  No orders of the defined types were placed in this encounter.    Time spent: 39  I have personally obtained a history, examined the patient, evaluated laboratory and imaging results, formulated the assessment and plan and placed orders.    Allyne Gee, MD [facility] Sleep medicine

## 2022-02-04 ENCOUNTER — Telehealth: Payer: Medicare Other

## 2022-02-04 DIAGNOSIS — Z96652 Presence of left artificial knee joint: Secondary | ICD-10-CM | POA: Diagnosis not present

## 2022-02-04 DIAGNOSIS — M1712 Unilateral primary osteoarthritis, left knee: Secondary | ICD-10-CM | POA: Diagnosis not present

## 2022-02-11 ENCOUNTER — Ambulatory Visit (INDEPENDENT_AMBULATORY_CARE_PROVIDER_SITE_OTHER): Payer: Medicare Other

## 2022-02-11 DIAGNOSIS — I10 Essential (primary) hypertension: Secondary | ICD-10-CM

## 2022-02-11 DIAGNOSIS — J439 Emphysema, unspecified: Secondary | ICD-10-CM

## 2022-02-11 NOTE — Progress Notes (Signed)
Chronic Care Management Pharmacy Note  02/12/2022 Name:  Julie Keith MRN:  [id number] DOB:  [birth year]  Summary: Patient presents for CCM follow-up.   -Patient denies consistent use of maintenance inhaler. She was prescribed Trelegy by pulmonology and picked up one inhaler back in July, but has not started using the medicine due to concerns with the cost. We discussed at length the benefits of daily maintenance use. Unfortunately, she will likely have a hard time meeting the requirements for patient assistance for trelegy.   Recommendations/Changes made from today's visit: -START using Trelegy 1 puff daily. Will reach out to pulmonology to discuss switching patient to [facility] once patient finishes current supply of Trelegy.    Plan: CPP follow-up 3 months  Subjective: Julie Keith is an 76 y.o. year old female who is a primary patient of Delsa Grana, [place].  The CCM team was consulted for assistance with disease management and care coordination needs.    Engaged with patient by telephone for follow up visit in response to provider referral for pharmacy case management and/or care coordination services.   Consent to Services:  The patient was given information about Chronic Care Management services, agreed to services, and gave verbal consent prior to initiation of services.  Please see initial visit note for detailed documentation.   Patient Care Team: Delsa Grana, PA-C as PCP - General (Family Medicine) Wellington Hampshire, MD as PCP - Cardiology (Cardiology) Ree Edman, MD (Dermatology) Wellington Hampshire, MD as Consulting Physician (Cardiology) Germaine Pomfret, [facility] (Pharmacist) Allyne Gee, MD as Consulting Physician (Pulmonary Disease)  Recent office visits: 12/16/21: Patient presented to Serafina Royals, FNP for ED follow-up. Famotidine 20 mg twice daily.  08/22/21: Patient presented to Delsa Grana, PA-C for follow-up. 7.5%. eGFR 35   Recent consult  visits: 01/19/22: Patient presented to Dr. Humphrey Rolls (Pulmonlogy) for follow-up.  09/16/21: Patient presented to Dr. Mortimer Fries (Pulmonology) for follow-up. Trelegy.   Hospital visits: 11/29/21: Patient presented to ED for Covid-19  Recommended Problem List Changes:   Modify:  Type 2 diabetes mellitus with stage 3a chronic kidney disease, without long-term current use of insulin ([place])   Objective:  Lab Results  Component Value Date   CREATININE 1.31 (H) 11/29/2021   BUN 24 (H) 11/29/2021   GFRNONAA 42 (L) 11/29/2021   GFRAA 55 (L) 04/15/2020   NA 135 11/29/2021   K 4.2 11/29/2021   CALCIUM 8.8 (L) 11/29/2021   CO2 29 11/29/2021   GLUCOSE 282 (H) 11/29/2021    Lab Results  Component Value Date/Time   HGBA1C 7.5 (H) 08/22/2021 08:38 AM   HGBA1C 7.0 (H) 03/17/2021 09:40 AM   HGBA1C 7.5 01/10/2016 12:00 AM   MICROALBUR 2.1 04/15/2020 11:16 AM   MICROALBUR 2.4 12/27/2018 12:00 AM   MICROALBUR 100 03/05/2015 07:47 AM    Last diabetic Eye exam:  Lab Results  Component Value Date/Time   HMDIABEYEEXA No Retinopathy 01/14/2021 12:00 AM    Last diabetic Foot exam:  Lab Results  Component Value Date/Time   HMDIABFOOTEX Normal 11/07/2021 12:00 AM     Lab Results  Component Value Date   CHOL 152 11/08/2020   HDL 58 11/08/2020   LDLCALC 75 11/08/2020   TRIG 107 11/08/2020   CHOLHDL 2.6 11/08/2020       Latest Ref Rng & Units 11/29/2021   10:05 PM 08/22/2021    8:38 AM 03/17/2021    9:40 AM  Hepatic Function  Total Protein 6.5 - 8.1 g/dL 7.8  7.1  7.4   Albumin 3.5 - 5.0 g/dL 3.4     AST 15 - 41 U/L _0 ALT 0 - 44 U/L _1 Alk Phosphatase 38 - 126 U/L 97     Total Bilirubin 0.3 - 1.2 mg/dL 0.6  0.4  0.5     Lab Results  Component Value Date/Time   TSH 1.63 11/08/2020 10:25 AM   TSH 1.45 07/29/2016 08:42 AM       Latest Ref Rng & Units 11/30/2021    5:40 AM 11/29/2021   10:05 PM 10/15/2021    6:20 AM  CBC  WBC 4.0 - 10.5 K/uL 5.6  5.0    Hemoglobin 12.0 -  15.0 g/dL 9.7  11.0  13.9   Hematocrit 36.0 - 46.0 % 31.8  35.9  41.0   Platelets 150 - 400 K/uL 282  348      No results found for: "VD25OH"  Clinical ASCVD: No  The 10-year ASCVD risk score (Arnett DK, et al., 2019) is: 65.7%   Values used to calculate the score:     Age: 76 years     Sex: Female     Is Non-Hispanic African American: No     Diabetic: Yes     Tobacco smoker: No     Systolic Blood Pressure: 376 mmHg     Is BP treated: Yes     HDL Cholesterol: 58 mg/dL     Total Cholesterol: 152 mg/dL       01/08/2022    9:43 AM 12/16/2021    1:33 PM 11/28/2021    3:07 PM  Depression screen PHQ 2/9  Decreased Interest 0 0 0  Down, Depressed, Hopeless 0 0 0  PHQ - 2 Score 0 0 0  Altered sleeping 0 0 0  Tired, decreased energy 0 0 0  Change in appetite 0 0 0  Feeling bad or failure about yourself  0 0 0  Trouble concentrating 0 0 0  Moving slowly or fidgety/restless 0 0 0  Suicidal thoughts 0 0 0  PHQ-9 Score 0 0 0  Difficult doing work/chores  Not difficult at all Not difficult at all    Social History   Tobacco Use  Smoking Status Former   Packs/day: 2.00   Years: 30.00   Total pack years: 60.00   Types: Cigarettes   Quit date: 1990   Years since quitting: 33.8  Smokeless Tobacco Never   BP Readings from Last 3 Encounters:  01/19/22 (!) 174/62  12/16/21 134/78  11/30/21 (!) 157/56   Pulse Readings from Last 3 Encounters:  01/19/22 67  12/16/21 87  11/30/21 (!) 51   Wt Readings from Last 3 Encounters:  01/19/22 204 lb 3.2 oz (92.6 kg)  12/16/21 207 lb (93.9 kg)  11/30/21 209 lb 7 oz (95 kg)   BMI Readings from Last 3 Encounters:  01/19/22 33.98 kg/m  12/16/21 34.45 kg/m  11/30/21 34.85 kg/m    Assessment/Interventions: Review of patient past medical history, allergies, medications, health status, including review of consultants reports, laboratory and other test data, was performed as part of comprehensive evaluation and provision of chronic care  management services.   SDOH:  (Social Determinants of Health) assessments and interventions performed: Yes SDOH Interventions    Flowsheet Row Clinical Support from 01/08/2022 in [place] Management from 05/07/2021 in [facility]  SDOH Interventions  Food Insecurity Interventions Intervention Not Indicated --  Housing Interventions Intervention Not Indicated --  Transportation Interventions Intervention Not Indicated --  Utilities Interventions Intervention Not Indicated --  Alcohol Usage Interventions Intervention Not Indicated (Score <7) --  Financial Strain Interventions Intervention Not Indicated Intervention Not Indicated  Physical Activity Interventions Intervention Not Indicated --  Stress Interventions Intervention Not Indicated --  Social Connections Interventions Intervention Not Indicated --       SDOH Screenings   Food Insecurity: No Food Insecurity (01/08/2022)  Housing: Low Risk  (01/08/2022)  Transportation Needs: No Transportation Needs (01/08/2022)  Utilities: Not At Risk (01/08/2022)  Alcohol Screen: Low Risk  (05/30/2021)  Depression (PHQ2-9): Low Risk  (01/08/2022)  Financial Resource Strain: Low Risk  (01/08/2022)  Physical Activity: Insufficiently Active (01/08/2022)  Social Connections: Socially Isolated (01/08/2022)  Stress: No Stress Concern Present (01/08/2022)  Tobacco Use: Medium Risk (01/19/2022)    [facility]  Allergies  Allergen Reactions   Amlodipine     Lower extremity swelling   Metformin And Related     headaches    Medications Reviewed Today     Reviewed by Tyron Russell, [place] (Certified Medical Assistant) on 01/19/22 at 1023  Med List Status: <None>   Medication Order Taking? Sig Documenting Provider Last Dose Status Informant  acetaminophen (TYLENOL) 500 MG tablet [id number]  Take 1,000 mg by mouth every 8 (eight) hours as needed for moderate pain. [provider]  Active  Self, Pharmacy Records, Multiple Informants, Other  albuterol (VENTOLIN HFA) 108 (90 Base) MCG/ACT inhaler [id number]  Inhale 2 puffs into the lungs every 6 (six) hours as needed for wheezing or shortness of breath. Allyne Gee, MD  Active Pharmacy Records, Multiple Informants, Other, Self  Apremilast (OTEZLA) 30 MG TABS [id number]  Take 30 mg by mouth in the morning and at bedtime. [provider]  Active Pharmacy Records, Multiple Informants, Other, Self  aspirin 81 MG chewable tablet [id number]  Chew 1 tablet (81 mg total) by mouth 2 (two) times daily. Carlynn Spry, PA-C  Active Self, Pharmacy Records, Multiple Informants, Other  benzonatate (TESSALON) 100 MG capsule [id number]  Take 1-2 capsules (100-200 mg total) by mouth 3 (three) times daily as needed for cough.  Patient not taking: Reported on 01/08/2022   Edwin Dada, MD  Active   carvedilol (COREG) 6.25 MG tablet [id number]  Take 1 tablet (6.25 mg total) by mouth 2 (two) times daily. Theora Gianotti, NP  Active Pharmacy Records, Multiple Informants, Other, Self  cetirizine (ZYRTEC) 10 MG tablet [id number]  Take 1 tablet (10 mg total) by mouth daily. Bo Merino, FNP  Active Pharmacy Records, Multiple Informants, Other, Self  clobetasol (TEMOVATE) 0.05 % external solution [id number]  Apply topically 2 (two) times daily. [provider]  Active   doxepin (SINEQUAN) 50 MG capsule [id number]  Take 50 mg by mouth at bedtime. [provider]  Active   enalapril (VASOTEC) 20 MG tablet [id number]  Take 1 tablet (20 mg total) by mouth 2 (two) times daily. Delsa Grana, PA-C  Active Pharmacy Records, Multiple Informants, Other, Self  EQUATE STOOL SOFTENER 100 MG capsule [id number]  Take 100 mg by mouth 2 (two) times daily. [provider]  Active Pharmacy Records, Multiple Informants, Other, Self  famotidine (PEPCID) 20 MG tablet [id number]  Take 1 tablet (20 mg total) by mouth 2 (two) times daily.  Bo Merino, FNP  Active   fluticasone [facility]) 50 MCG/ACT nasal spray [id number]  Place  2 sprays into both nostrils daily. Bo Merino, FNP  Active Self, Pharmacy Records, Multiple Informants, Other  Fluticasone-Umeclidin-[facility] ELLIPTA) 100-62.5-25 MCG/ACT AEPB [id number]  Inhale 1 puff into the lungs daily. Allyne Gee, MD  Active Pharmacy Records, Multiple Informants, Other, Self  glimepiride (AMARYL) 4 MG tablet [id number]  Take 1 tablet (4 mg total) by mouth daily with breakfast. Delsa Grana, PA-C  Active Pharmacy Records, Multiple Informants, Other, Self  glucose blood (ONETOUCH ULTRA) test strip [id number]  USE 1 STRIP TO CHECK GLUCOSE IN THE MORNING, AT NOON, AND AT BEDTIME. Bo Merino, FNP  Active Pharmacy Records, Multiple Informants, Other, Self  hydrochlorothiazide (HYDRODIURIL) 25 MG tablet [id number]  Take 1 tablet (25 mg total) by mouth daily. Delsa Grana, PA-C  Active Pharmacy Records, Multiple Informants, Other, Self  hydrOXYzine (ATARAX) 25 MG tablet [id number]  Take 25 mg by mouth daily. [provider]  Active   ipratropium-albuterol (DUONEB) 0.5-2.5 (3) MG/3ML SOLN [id number]  Take 3 mLs by nebulization every 6 (six) hours as needed (cough, wheeze, shortness of breath). Delsa Grana, PA-C  Active Pharmacy Records, Multiple Informants, Other, Self  nystatin cream (MYCOSTATIN) [id number]  Apply topically. [provider]  Active Pharmacy Records, Multiple Informants, Other, Self  OXYGEN [id number]  Inhale 2 L into the lungs at bedtime. Uses APRIA at night [provider]  Active Pharmacy Records, Multiple Informants, Other, Self  pantoprazole (PROTONIX) 20 MG tablet [id number]  Take 1 tablet (20 mg total) by mouth daily. Delsa Grana, PA-C  Active Pharmacy Records, Multiple Informants, Other, Self  pravastatin (PRAVACHOL) 80 MG tablet [id number]  Take 1 tablet (80 mg total) by mouth daily. Delsa Grana, PA-C  Active Pharmacy Records, Multiple  Informants, Other, Self  triamcinolone (KENALOG) 0.025 % cream [id number]  Apply topically. [provider]  Active             Patient Active Problem List   Diagnosis Date Noted   COPD exacerbation ([place]) 11/30/2021   COVID-19 virus infection 11/30/2021   Dyslipidemia 99/37/1696   Acute metabolic encephalopathy 78/93/8101   Normocytic anemia 11/30/2021   COVID 11/30/2021   Candidiasis of skin 11/11/2021   History of total hip replacement, left 10/15/2021   History of revision of total replacement of left hip joint 10/15/2021   Osteoarthritis of left hip 09/04/2021   Leukocytosis 08/22/2021   Immunosuppression due to drug therapy ([place]) 03/11/2021   History of total knee replacement, left 06/17/2020   Osteoarthritis of knee 03/09/2020   Osteoarthritis of left knee 02/28/2020   Stage 3b chronic kidney disease (CKD) ([place]) 09/21/2019   Thrombocytosis 04/07/2017   Psoriasis 10/10/2015   GERD (gastroesophageal reflux disease) 10/10/2015   Type 2 diabetes mellitus without complication, without long-term current use of insulin ([place]) 10/31/2014   Essential hypertension 10/31/2014   Hyperlipidemia 10/31/2014   Obesity (BMI 30-39.9) 10/31/2014   Pulmonary emphysema ([place]) 06/28/2014   Chronic respiratory failure ([place]) 06/28/2014    Immunization History  Administered Date(s) Administered   Fluad Quad(high Dose 65+) 12/27/2018, 01/23/2020, 12/26/2020   Influenza, High Dose Seasonal PF 02/14/2015, 01/27/2016, 01/18/2017, 01/26/2018   Influenza-Unspecified 01/31/2014   PFIZER(Purple Top)SARS-COV-2 Vaccination 03/21/2020, 04/10/2020   Pneumococcal Conjugate-13 02/19/2014   Pneumococcal Polysaccharide-23 04/01/2010   Zoster, Live 10/14/2010    Conditions to be addressed/monitored:  Hypertension, Hyperlipidemia, Diabetes, GERD, Chronic Kidney Disease, and Osteoarthritis  Care Plan : General Pharmacy (Adult)  Updates made by Germaine Pomfret, [place] since 02/12/2022 12:00 AM  Problem: Hypertension, Hyperlipidemia, Diabetes, GERD, Chronic Kidney Disease, and Osteoarthritis   Priority: High     Long-Range Goal: Patient-Specific Goal   Start Date: 05/20/2021  Expected End Date: 05/20/2022  This Visit's Progress: On track  Recent Progress: On track  Priority: High  Note:   Current Barriers:  Suboptimal control of COPD   Pharmacist Clinical Goal(s):  Patient will maintain control of blood pressure as evidenced by BP less than 140/90  through collaboration with PharmD and provider.   Interventions: 1:1 collaboration with Delsa Grana, PA-C regarding development and update of comprehensive plan of care as evidenced by provider attestation and co-signature Inter-disciplinary care team collaboration (see longitudinal plan of care) Comprehensive medication review performed; medication list updated in electronic medical record  Hypertension (BP goal <140/90) -Controlled -Current treatment: Carvedilol 6.25 mg twice daily: Appropriate, Effective, Safe, Accessible Enalapril 20 mg twice daily: Appropriate, Effective, Safe, Accessible  HCTZ 25 mg daily: Appropriate, Effective, Safe, Accessible  -Medications previously tried: NA  -History of tachycardia  -Current home readings: 120s/70s  -Denies hypotensive/hypertensive symptoms -Recommended to continue current medication  Hyperlipidemia: (LDL goal < 70) -Uncontrolled -Current treatment: Pravastatin 80 mg daily: Appropriate, Effective, Safe, Accessible  -Current treatment: Aspirin 81 mg twice daily: Appropriate, Effective, Safe, Accessible  -Medications previously tried: NA  -Recommended to continue current medication  Diabetes (A1c goal <7%) -Controlled -Current medications: Glimepiride 4 mg daily: Appropriate, Effective, Safe, Accessible  -Medications previously tried: Actos, Metformin (headaches)   -Current home glucose readings fasting glucose: 116,  -Denies hypoglycemic/hyperglycemic  symptoms -Recommended to continue current medication  GERD (Goal: Prevent heartburn) -Controlled -Current treatment  Pantoprazole 20 mg daily: Appropriate, Effective, Safe, Accessible  -Medications previously tried: NA  -Hasn't had symptoms, could consider taper.  -Recommended to continue current medication  COPD (Goal: control symptoms and prevent exacerbations) -Not ideally controlled -O2 dependent at night.  -Current treatment  Albuterol HFA: Appropriate, Effective, Safe, Accessible  Trelegy 1 puff daily  -Medications previously tried: NA  -Gold Grade: Gold 3 (FEV1 30-49%), group B  -MMRC/CAT score: 2 -Does report intermittent cough.  -Pulmonary function testing: FEV 47% (2023) -Exacerbations requiring treatment in last 6 months: Feb 2023 -Patient denies consistent use of maintenance inhaler. She was prescribed Trelegy by pulmonology and picked up one inhaler back in July, but has not started using the medicine due to concerns with the cost. We discussed at length the benefits of daily maintenance use. Unfortunately, she will likely have a hard time meeting the requirements for patient assistance for trelegy.   -START using Trelegy 1 puff daily. Will reach out to pulmonology to discuss switching patient to [facility] once patient finishes current supply of Trelegy.   Psoriatic Arthritis  (Goal: Minimize pain symptoms) -Controlled -managed by Dr. Phillip Heal -Current treatment  Acetaminophen 500 mg 2-5 tablets twice daily as needed: Appropriate, Effective, Safe, Accessible Apremilast 30 mg twice daily: Appropriate, Effective, Safe, Accessible -Medications previously tried:   -Hasn't needed tylenol recently. -Recommended to continue current medication  Chronic Kidney Disease Stage 3a  -All medications assessed for renal dosing and appropriateness in chronic kidney disease. -Recommended to continue current medication  Patient Goals/Self-Care Activities Patient will:  - check  glucose daily before breakfast, document, and provide at future appointments  Follow Up Plan: Telephone follow up appointment with care management team member scheduled for:  05/13/2022 at 10:00 AM    Medication Assistance: None required.  Patient affirms current coverage meets needs.  Compliance/Adherence/Medication fill history: Care Gaps: Tdap Covid booster  Star-Rating  Drugs: Enalapril 20 mg: Last filled 09/26/21 for 90-DS Glimepiride 4 mg: Last filled 08/06/21 for 90-DS Pravastatin 80 mg: Last filled 09/26/21 for 90-DS  Patient's preferred pharmacy is:  [facility] [address] [place] [address] Phone: [phone] Fax: [phone]  Uses pill box? Yes Pt endorses 100% compliance  We discussed: Current pharmacy is preferred with insurance plan and patient is satisfied with pharmacy services Patient decided to: Continue current medication management strategy  Care Plan and Follow Up Patient Decision:  Patient agrees to Care Plan and Follow-up.  Plan: Telephone follow up appointment with care management team member scheduled for:  05/13/2022 at 10:00 AM  [name], [place] Pharmacist Practitioner  [facility] [phone]

## 2022-02-12 NOTE — Patient Instructions (Signed)
Visit Information It was great speaking with you today!  Please let me know if you have any questions about our visit.   Goals Addressed   None     Patient Care Plan: General Pharmacy (Adult)     Problem Identified: Hypertension, Hyperlipidemia, Diabetes, GERD, Chronic Kidney Disease, and Osteoarthritis   Priority: High     Long-Range Goal: Patient-Specific Goal   Start Date: 05/20/2021  Expected End Date: 05/20/2022  This Visit's Progress: On track  Recent Progress: On track  Priority: High  Note:   Current Barriers:  Suboptimal control of COPD   Pharmacist Clinical Goal(s):  Patient will maintain control of blood pressure as evidenced by BP less than 140/90  through collaboration with PharmD and provider.   Interventions: 1:1 collaboration with Delsa Grana, PA-C regarding development and update of comprehensive plan of care as evidenced by provider attestation and co-signature Inter-disciplinary care team collaboration (see longitudinal plan of care) Comprehensive medication review performed; medication list updated in electronic medical record  Hypertension (BP goal <140/90) -Controlled -Current treatment: Carvedilol 6.25 mg twice daily: Appropriate, Effective, Safe, Accessible Enalapril 20 mg twice daily: Appropriate, Effective, Safe, Accessible  HCTZ 25 mg daily: Appropriate, Effective, Safe, Accessible  -Medications previously tried: NA  -History of tachycardia  -Current home readings: 120s/70s  -Denies hypotensive/hypertensive symptoms -Recommended to continue current medication  Hyperlipidemia: (LDL goal < 70) -Uncontrolled -Current treatment: Pravastatin 80 mg daily: Appropriate, Effective, Safe, Accessible  -Current treatment: Aspirin 81 mg twice daily: Appropriate, Effective, Safe, Accessible  -Medications previously tried: NA  -Recommended to continue current medication  Diabetes (A1c goal <7%) -Controlled -Current medications: Glimepiride 4 mg  daily: Appropriate, Effective, Safe, Accessible  -Medications previously tried: Actos, Metformin (headaches)   -Current home glucose readings fasting glucose: 116,  -Denies hypoglycemic/hyperglycemic symptoms -Recommended to continue current medication  GERD (Goal: Prevent heartburn) -Controlled -Current treatment  Pantoprazole 20 mg daily: Appropriate, Effective, Safe, Accessible  -Medications previously tried: NA  -Hasn't had symptoms, could consider taper.  -Recommended to continue current medication  COPD (Goal: control symptoms and prevent exacerbations) -Not ideally controlled -O2 dependent at night.  -Current treatment  Albuterol HFA: Appropriate, Effective, Safe, Accessible  Trelegy 1 puff daily  -Medications previously tried: NA  -Gold Grade: Gold 3 (FEV1 30-49%), group B  -MMRC/CAT score: 2 -Does report intermittent cough.  -Pulmonary function testing: FEV 47% (2023) -Exacerbations requiring treatment in last 6 months: Feb 2023 -Patient denies consistent use of maintenance inhaler. She was prescribed Trelegy by pulmonology and picked up one inhaler back in July, but has not started using the medicine due to concerns with the cost. We discussed at length the benefits of daily maintenance use. Unfortunately, she will likely have a hard time meeting the requirements for patient assistance for trelegy.   -START using Trelegy 1 puff daily. Will reach out to pulmonology to discuss switching patient to [facility] once patient finishes current supply of Trelegy.   Psoriatic Arthritis  (Goal: Minimize pain symptoms) -Controlled -managed by Dr. Phillip Heal -Current treatment  Acetaminophen 500 mg 2-5 tablets twice daily as needed: Appropriate, Effective, Safe, Accessible Apremilast 30 mg twice daily: Appropriate, Effective, Safe, Accessible -Medications previously tried:   -Hasn't needed tylenol recently. -Recommended to continue current medication  Chronic Kidney Disease Stage 3a   -All medications assessed for renal dosing and appropriateness in chronic kidney disease. -Recommended to continue current medication  Patient Goals/Self-Care Activities Patient will:  - check glucose daily before breakfast, document, and provide at future appointments  Follow Up Plan: Telephone follow up appointment with care management team member scheduled for:  05/13/2022 at 10:00 AM    Patient agreed to services and verbal consent obtained.   Patient verbalizes understanding of instructions and care plan provided today and agrees to view in [facility]. Active MyChart status and patient understanding of how to access instructions and care plan via MyChart confirmed with patient.     [name], [place] Pharmacist Practitioner  [facility] [phone]

## 2022-02-16 DIAGNOSIS — H35371 Puckering of macula, right eye: Secondary | ICD-10-CM | POA: Diagnosis not present

## 2022-02-16 LAB — HM DIABETES EYE EXAM

## 2022-02-17 DIAGNOSIS — E785 Hyperlipidemia, unspecified: Secondary | ICD-10-CM | POA: Diagnosis not present

## 2022-02-17 DIAGNOSIS — I129 Hypertensive chronic kidney disease with stage 1 through stage 4 chronic kidney disease, or unspecified chronic kidney disease: Secondary | ICD-10-CM | POA: Diagnosis not present

## 2022-02-17 DIAGNOSIS — E1122 Type 2 diabetes mellitus with diabetic chronic kidney disease: Secondary | ICD-10-CM

## 2022-02-17 DIAGNOSIS — J449 Chronic obstructive pulmonary disease, unspecified: Secondary | ICD-10-CM

## 2022-02-17 DIAGNOSIS — E1159 Type 2 diabetes mellitus with other circulatory complications: Secondary | ICD-10-CM

## 2022-02-17 DIAGNOSIS — Z7984 Long term (current) use of oral hypoglycemic drugs: Secondary | ICD-10-CM

## 2022-02-17 DIAGNOSIS — N1831 Chronic kidney disease, stage 3a: Secondary | ICD-10-CM

## 2022-02-17 DIAGNOSIS — L4050 Arthropathic psoriasis, unspecified: Secondary | ICD-10-CM

## 2022-02-19 ENCOUNTER — Other Ambulatory Visit: Payer: Self-pay | Admitting: Family Medicine

## 2022-02-19 DIAGNOSIS — E782 Mixed hyperlipidemia: Secondary | ICD-10-CM

## 2022-02-19 DIAGNOSIS — E119 Type 2 diabetes mellitus without complications: Secondary | ICD-10-CM

## 2022-02-19 NOTE — Telephone Encounter (Signed)
Requested medication (s) are due for refill today: expired medication  Requested medication (s) are on the active medication list: yes  Last refill:  11/08/20 #90 3 refills  Future visit scheduled: yes in 4 days  Notes to clinic:  expired medication, protocol failed. Last labs 11/08/20 . Do you want to renew Rx?      Requested Prescriptions  Pending Prescriptions Disp Refills   pravastatin (PRAVACHOL) 80 MG tablet [Pharmacy Med Name: Pravastatin Sodium 80 MG Oral Tablet] 90 tablet 0    Sig: Take 1 tablet by mouth once daily     Cardiovascular:  Antilipid - Statins Failed - 02/19/2022  2:39 PM      Failed - Lipid Panel in normal range within the last 12 months    Cholesterol, Total  Date Value Ref Range Status  03/13/2015 205 (H) 100 - 199 mg/dL Final   Cholesterol  Date Value Ref Range Status  11/08/2020 152 <200 mg/dL Final   LDL Cholesterol (Calc)  Date Value Ref Range Status  11/08/2020 75 mg/dL (calc) Final    Comment:    Reference range: <100 . Desirable range <100 mg/dL for primary prevention;   <70 mg/dL for patients with CHD or diabetic patients  with > or = 2 CHD risk factors. Marland Kitchen LDL-C is now calculated using the Martin-Hopkins  calculation, which is a validated novel method providing  better accuracy than the Friedewald equation in the  estimation of LDL-C.  Cresenciano Genre et al. Annamaria Helling. 7253;664(40): 2061-2068  (http://education.[facility].com/faq/FAQ164)    HDL  Date Value Ref Range Status  11/08/2020 58 > OR = 50 mg/dL Final  03/13/2015 52 >39 mg/dL Final   Triglycerides  Date Value Ref Range Status  11/08/2020 107 <150 mg/dL Final         Passed - Patient is not pregnant      Passed - Valid encounter within last 12 months    Recent Outpatient Visits           2 months ago COVID-19   [facility] Serafina Royals F, FNP   2 months ago Bronchospasm with bronchitis, acute   [facility] Delsa Grana, PA-C   6  months ago Type 2 diabetes mellitus without complication, without long-term current use of insulin [facility])   [facility] Delsa Grana, PA-C   8 months ago Viral upper respiratory tract infection   [facility] Bo Merino, FNP   11 months ago Essential hypertension   [facility] Delsa Grana, PA-C       Future Appointments             In 4 days Delsa Grana, PA-C [place], [place]   In 10 months  [facility], [facility]

## 2022-02-23 ENCOUNTER — Encounter: Payer: Self-pay | Admitting: Family Medicine

## 2022-02-23 ENCOUNTER — Ambulatory Visit (INDEPENDENT_AMBULATORY_CARE_PROVIDER_SITE_OTHER): Payer: Medicare Other | Admitting: Family Medicine

## 2022-02-23 ENCOUNTER — Ambulatory Visit: Payer: Medicare Other | Admitting: Nurse Practitioner

## 2022-02-23 VITALS — BP 126/68 | HR 70 | Temp 97.8°F | Resp 16 | Ht 65.0 in | Wt 206.0 lb

## 2022-02-23 DIAGNOSIS — E782 Mixed hyperlipidemia: Secondary | ICD-10-CM | POA: Diagnosis not present

## 2022-02-23 DIAGNOSIS — N1832 Chronic kidney disease, stage 3b: Secondary | ICD-10-CM

## 2022-02-23 DIAGNOSIS — E1121 Type 2 diabetes mellitus with diabetic nephropathy: Secondary | ICD-10-CM

## 2022-02-23 DIAGNOSIS — I10 Essential (primary) hypertension: Secondary | ICD-10-CM

## 2022-02-23 DIAGNOSIS — J439 Emphysema, unspecified: Secondary | ICD-10-CM

## 2022-02-23 DIAGNOSIS — L281 Prurigo nodularis: Secondary | ICD-10-CM | POA: Diagnosis not present

## 2022-02-23 DIAGNOSIS — Z5181 Encounter for therapeutic drug level monitoring: Secondary | ICD-10-CM

## 2022-02-23 DIAGNOSIS — K219 Gastro-esophageal reflux disease without esophagitis: Secondary | ICD-10-CM

## 2022-02-23 DIAGNOSIS — E119 Type 2 diabetes mellitus without complications: Secondary | ICD-10-CM

## 2022-02-23 DIAGNOSIS — Z780 Asymptomatic menopausal state: Secondary | ICD-10-CM

## 2022-02-23 DIAGNOSIS — Z23 Encounter for immunization: Secondary | ICD-10-CM

## 2022-02-23 DIAGNOSIS — L400 Psoriasis vulgaris: Secondary | ICD-10-CM | POA: Diagnosis not present

## 2022-02-23 DIAGNOSIS — Z79899 Other long term (current) drug therapy: Secondary | ICD-10-CM | POA: Diagnosis not present
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
## 2022-02-24 ENCOUNTER — Other Ambulatory Visit: Payer: Self-pay | Admitting: Family Medicine

## 2022-02-24 ENCOUNTER — Encounter: Payer: Self-pay | Admitting: Family Medicine

## 2022-02-24 DIAGNOSIS — E875 Hyperkalemia: Secondary | ICD-10-CM

## 2022-02-24 LAB — HEMOGLOBIN A1C
Hgb A1c MFr Bld: 7.3 % of total Hgb — ABNORMAL HIGH (ref <5.7)
Mean Plasma Glucose: 163 mg/dL
eAG (mmol/L): 9 mmol/L

## 2022-02-24 LAB — COMPLETE METABOLIC PANEL WITH GFR
AG Ratio: 1.3 (calc) (ref 1.0–2.5)
ALT: 11 U/L (ref 6–29)
AST: 13 U/L (ref 10–35)
Albumin: 4 g/dL (ref 3.6–5.1)
Alkaline phosphatase (APISO): 103 U/L (ref 37–153)
BUN/Creatinine Ratio: 23 (calc) — ABNORMAL HIGH (ref 6–22)
BUN: 26 mg/dL — ABNORMAL HIGH (ref 7–25)
CO2: 32 mmol/L (ref 20–32)
Calcium: 9.9 mg/dL (ref 8.6–10.4)
Chloride: 98 mmol/L (ref 98–110)
Creat: 1.15 mg/dL — ABNORMAL HIGH (ref 0.60–1.00)
Globulin: 3.2 g/dL (calc) (ref 1.9–3.7)
Glucose, Bld: 149 mg/dL — ABNORMAL HIGH (ref 65–99)
Potassium: 5.8 mmol/L — ABNORMAL HIGH (ref 3.5–5.3)
Sodium: 138 mmol/L (ref 135–146)
Total Bilirubin: 0.4 mg/dL (ref 0.2–1.2)
Total Protein: 7.2 g/dL (ref 6.1–8.1)
eGFR: 49 mL/min/{1.73_m2} — ABNORMAL LOW (ref >=60)

## 2022-02-24 LAB — CBC WITH DIFFERENTIAL/PLATELET
Absolute Monocytes: 695 cells/uL (ref 200–950)
Basophils Absolute: 46 cells/uL (ref 0–200)
Basophils Relative: 0.4 %
Eosinophils Absolute: 80 cells/uL (ref 15–500)
Eosinophils Relative: 0.7 %
HCT: 41 % (ref 35.0–45.0)
Hemoglobin: 13.2 g/dL (ref 11.7–15.5)
Lymphs Abs: 2326 cells/uL (ref 850–3900)
MCH: 28.7 pg (ref 27.0–33.0)
MCHC: 32.2 g/dL (ref 32.0–36.0)
MCV: 89.1 fL (ref 80.0–100.0)
MPV: 9.4 fL (ref 7.5–12.5)
Monocytes Relative: 6.1 %
Neutro Abs: 8254 cells/uL — ABNORMAL HIGH (ref 1500–7800)
Neutrophils Relative %: 72.4 %
Platelets: 390 10*3/uL (ref 140–400)
RBC: 4.6 10*6/uL (ref 3.80–5.10)
RDW: 13.6 % (ref 11.0–15.0)
Total Lymphocyte: 20.4 %
WBC: 11.4 10*3/uL — ABNORMAL HIGH (ref 3.8–10.8)

## 2022-02-24 LAB — MICROALBUMIN / CREATININE URINE RATIO
Creatinine, Urine: 171 mg/dL (ref 20–275)
Microalb Creat Ratio: 6 mcg/mg creat (ref <30)
Microalb, Ur: 1.1 mg/dL

## 2022-02-24 LAB — LIPID PANEL
Cholesterol: 188 mg/dL (ref <200)
HDL: 64 mg/dL (ref >=50)
LDL Cholesterol (Calc): 106 mg/dL (calc) — ABNORMAL HIGH
Non-HDL Cholesterol (Calc): 124 mg/dL (calc) (ref <130)
Total CHOL/HDL Ratio: 2.9 (calc) (ref <5.0)
Triglycerides: 89 mg/dL (ref <150)

## 2022-02-24 NOTE — Assessment & Plan Note (Signed)
Per pulmonary, sx do seem improved since the last time I saw her

## 2022-02-24 NOTE — Assessment & Plan Note (Signed)
A1C has not been at goal but overall for her age A1C goal of 7.5 is likely acceptable She is using amaryl and we do not want hypoglycemia, so will be careful with that med or any dose adjustments, pt should focus on diet efforts She does not tolerate metformin - states HA on 500 mg dose May need to try other meds if A1C worsening Currently on ACEI and statin, foot exam done today, due for labs and DM eye exam

## 2022-02-24 NOTE — Assessment & Plan Note (Signed)
Pt not using PPI or pepcid daily, sx improved and are well controlled  Continue diet/lifestyle efforts for GERD, can use meds prn

## 2022-02-24 NOTE — Assessment & Plan Note (Signed)
Overdue for labs - her last routine OV were complicated by dyspnea/hypoxia and she is little behind for f/up Lipids today, previously managed with pravastatin, with refills/med review she may be out of meds for a few months although she does not report this. Will recheck labs, refill meds

## 2022-02-24 NOTE — Assessment & Plan Note (Signed)
BP currently at goal and well controlled on enalapril, coreg, HCTZ - due for labs

## 2022-03-10 ENCOUNTER — Ambulatory Visit (INDEPENDENT_AMBULATORY_CARE_PROVIDER_SITE_OTHER): Payer: Medicare Other | Admitting: Internal Medicine

## 2022-03-10 ENCOUNTER — Ambulatory Visit: Payer: Self-pay | Admitting: *Deleted

## 2022-03-10 ENCOUNTER — Encounter: Payer: Self-pay | Admitting: Internal Medicine

## 2022-03-10 VITALS — BP 132/68 | HR 74 | Temp 98.2°F | Resp 18 | Ht 65.0 in | Wt 204.2 lb

## 2022-03-10 DIAGNOSIS — J441 Chronic obstructive pulmonary disease with (acute) exacerbation: Secondary | ICD-10-CM | POA: Diagnosis not present

## 2022-03-10 DIAGNOSIS — R051 Acute cough: Secondary | ICD-10-CM | POA: Diagnosis not present

## 2022-03-10 LAB — POCT INFLUENZA A/B
Influenza A, POC: NEGATIVE
Influenza B, POC: NEGATIVE

## 2022-03-10 MED ORDER — ALBUTEROL SULFATE HFA 108 (90 BASE) MCG/ACT IN AERS: 2.0000 | INHALATION_SPRAY | Freq: Four times a day (QID) | RESPIRATORY_TRACT | 0 refills | Status: DC | PRN

## 2022-03-10 MED ORDER — PREDNISONE 20 MG PO TABS: 20.0000 mg | ORAL_TABLET | Freq: Two times a day (BID) | ORAL | 0 refills | Status: AC

## 2022-03-10 MED ORDER — DOXYCYCLINE HYCLATE 100 MG PO TABS: 100.0000 mg | ORAL_TABLET | Freq: Two times a day (BID) | ORAL | 0 refills | Status: AC

## 2022-03-10 NOTE — Telephone Encounter (Signed)
Attempted to reach pt x 3.  Recording "Not acceptiing your call, please hang up for two attempts.  Last attempt pt yelled" You got the wrong number, don't call no more!" Attempted to let her know this was the practice calling, but pt had already disconnected.

## 2022-03-10 NOTE — Telephone Encounter (Signed)
Spoke with patient and scheduled her to see Dr. Caralee Ates for today, 03/10/2022 @ 3pm.

## 2022-03-10 NOTE — Telephone Encounter (Signed)
Pt has congestion and asked if something could be called in for it/ she doesn't want it to get worse since she has COPD/ pt does have an appt tomorrow / please advise    Attempted to reach pt, recording "The number you are calling is not accepting your call."

## 2022-03-10 NOTE — Progress Notes (Signed)
Acute Office Visit  Subjective:     Patient ID: Julie Keith, female    DOB: June 21, 1945, 76 y.o.   MRN: [id number]  Chief Complaint  Patient presents with   URI    congestion    HPI Patient is in today for nasal congestion. Symptoms started 2 days ago. Does have a history of COPD and is on Trelegy and Albuterol. Not using rescue inhaler more frequently.   URI Compliant:  -Fever: no -Cough: yes, dry  -Shortness of breath: no -Wheezing: no -Chest tightness: no -Chest congestion: yes -Nasal congestion: yes -Runny nose: yes -Post nasal drip: yes -Sneezing: yes -Sore throat: yes -Sinus pressure: no -Headache: no -Face pain: no -Ear pain: no  -Ear pressure: no  -Sick contacts: no -Context: worse -Treatments attempted: Mucinex,Trelegy and Albuterol   Review of Systems  Constitutional:  Negative for chills and fever.  HENT:  Positive for congestion and sore throat. Negative for sinus pain.   Respiratory:  Positive for cough. Negative for shortness of breath and wheezing.   Cardiovascular:  Positive for chest pain.  Neurological:  Negative for headaches.      Objective:    BP 132/68   Pulse 74   Temp 98.2 F (36.8 C)   Resp 18   Ht 5\' 5"  (1.651 m)   Wt 204 lb 3.2 oz (92.6 kg)   SpO2 95%   BMI 33.98 kg/m  BP Readings from Last 3 Encounters:  03/10/22 132/68  02/23/22 126/68  01/19/22 (!) 174/62   Wt Readings from Last 3 Encounters:  03/10/22 204 lb 3.2 oz (92.6 kg)  02/23/22 206 lb (93.4 kg)  01/19/22 204 lb 3.2 oz (92.6 kg)      Physical Exam Constitutional:      Appearance: Normal appearance.  HENT:     Head: Normocephalic and atraumatic.     Right Ear: Tympanic membrane, ear canal and external ear normal.     Left Ear: Tympanic membrane, ear canal and external ear normal.     Nose: Congestion present.     Mouth/Throat:     Mouth: Mucous membranes are moist.     Pharynx: Posterior oropharyngeal erythema present.  Eyes:      Conjunctiva/sclera: Conjunctivae normal.  Cardiovascular:     Rate and Rhythm: Normal rate and regular rhythm.  Pulmonary:     Effort: Pulmonary effort is normal.     Comments: Decreased air movement throughout  Skin:    General: Skin is warm and dry.  Neurological:     General: No focal deficit present.     Mental Status: She is alert. Mental status is at baseline.  Psychiatric:        Mood and Affect: Mood normal.        Behavior: Behavior normal.     Results for orders placed or performed in visit on 03/10/22  POCT Influenza A/B  Result Value Ref Range   Influenza A, POC Negative Negative   Influenza B, POC Negative Negative        Assessment & Plan:   1. Chronic obstructive pulmonary disease with acute exacerbation (HCC): Treat as COPD exacerbation, sounds like allergies triggered her symptoms.  Tested for flu and COVID today.  Recommend she start an over-the-counter antihistamine for allergic symptoms.  Continue Trelegy daily, will refill albuterol to use as needed.  Treat COPD exacerbation with prednisone 40 mg x 5 days and doxycycline 100 mg twice daily x5 days.  Patient will follow-up if symptoms  worsen or fail to improve.  - POCT Influenza A/B - Novel Coronavirus, NAA ([facility]) - albuterol (VENTOLIN HFA) 108 (90 Base) MCG/ACT inhaler; Inhale 2 puffs into the lungs every 6 (six) hours as needed for wheezing or shortness of breath.  Dispense: 8 g; Refill: 0 - predniSONE (DELTASONE) 20 MG tablet; Take 1 tablet (20 mg total) by mouth 2 (two) times daily with a meal for 5 days.  Dispense: 10 tablet; Refill: 0 - doxycycline (VIBRA-TABS) 100 MG tablet; Take 1 tablet (100 mg total) by mouth 2 (two) times daily for 5 days.  Dispense: 10 tablet; Refill: 0   Return if symptoms worsen or fail to improve.  Margarita Mail, DO

## 2022-03-11 ENCOUNTER — Ambulatory Visit: Payer: Medicare Other | Admitting: Nurse Practitioner

## 2022-03-11 LAB — SPECIMEN STATUS REPORT

## 2022-03-11 LAB — NOVEL CORONAVIRUS, NAA: SARS-CoV-2, NAA: NOT DETECTED
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
## 2022-04-23 ENCOUNTER — Encounter: Payer: Self-pay | Admitting: Physician Assistant

## 2022-04-23 ENCOUNTER — Ambulatory Visit: Payer: BC Managed Care – PPO | Attending: Physician Assistant | Admitting: Physician Assistant

## 2022-04-23 VITALS — BP 120/48 | HR 59 | Ht 65.0 in | Wt 206.0 lb

## 2022-04-23 DIAGNOSIS — I10 Essential (primary) hypertension: Secondary | ICD-10-CM | POA: Diagnosis not present

## 2022-04-23 DIAGNOSIS — I491 Atrial premature depolarization: Secondary | ICD-10-CM | POA: Diagnosis not present

## 2022-04-23 DIAGNOSIS — R002 Palpitations: Secondary | ICD-10-CM | POA: Diagnosis not present

## 2022-04-23 DIAGNOSIS — E1121 Type 2 diabetes mellitus with diabetic nephropathy: Secondary | ICD-10-CM

## 2022-04-23 DIAGNOSIS — I471 Supraventricular tachycardia, unspecified: Secondary | ICD-10-CM | POA: Diagnosis not present

## 2022-04-23 DIAGNOSIS — E782 Mixed hyperlipidemia: Secondary | ICD-10-CM

## 2022-04-23 DIAGNOSIS — J9611 Chronic respiratory failure with hypoxia: Secondary | ICD-10-CM

## 2022-04-23 DIAGNOSIS — E875 Hyperkalemia: Secondary | ICD-10-CM

## 2022-04-23 NOTE — Patient Instructions (Signed)
Medication Instructions:  No changes at this time.   *If you need a refill on your cardiac medications before your next appointment, please call your pharmacy*   Lab Work: None  If you have labs (blood work) drawn today and your tests are completely normal, you will receive your results only by: MyChart Message (if you have MyChart) OR A paper copy in the mail If you have any lab test that is abnormal or we need to change your treatment, we will call you to review the results.   Testing/Procedures: None   Follow-Up: At [facility], you and your health needs are our priority.  As part of our continuing mission to provide you with exceptional heart care, we have created designated Provider Care Teams.  These Care Teams include your primary Cardiologist (physician) and Advanced Practice Providers (APPs -  Physician Assistants and Nurse Practitioners) who all work together to provide you with the care you need, when you need it.   Your next appointment:   1 year(s)  The format for your next appointment:   In Person  Provider:   Muhammad Arida, MD or Ryan Dunn, PA-C     Important Information About Sugar       

## 2022-05-06 ENCOUNTER — Telehealth: Payer: Self-pay | Admitting: *Deleted

## 2022-05-06 NOTE — Patient Instructions (Signed)
Visit Information  Thank you for taking time to visit with me today. Please don't hesitate to contact me if I can be of assistance to you.  Following are the goals we discussed today:  Continue monitoring blood sugar and blood pressure daily. Remember call from pharmacy team on 1/24  Please call the Suicide and Crisis Lifeline: 988 call the [place] National Suicide Prevention Lifeline: 512-267-8305 or TTY: 801-010-6342 TTY 813 366 2018) to talk to a trained counselor call 1-800-273-TALK (toll free, 24 hour hotline) call 911 if you are experiencing a Mental Health or [place] or need someone to talk to.  Patient verbalizes understanding of instructions and care plan provided today and agrees to view in [place]. Active MyChart status and patient understanding of how to access instructions and care plan via MyChart confirmed with patient.     The patient has been provided with contact information for the care management team and has been advised to call with any health related questions or concerns.   Valente David, RN, MSN, [place] Care Management Care Management Coordinator [phone]

## 2022-05-06 NOTE — Patient Outreach (Signed)
  Care Coordination   Initial Visit Note   05/06/2022 Name: Julie Keith MRN: [id number] DOB: August 14, 1945  Julie Keith is a 77 y.o. year old female who sees Delsa Grana, [place] for primary care. I spoke with  [name] by phone today.  What matters to the patients health and wellness today?  Ongoing management of chronic conditions in effort to maintain independence. Does not feel follow up  is indicated at this time, will call with questions.     Goals Addressed             This Visit's Progress    COMPLETED: Care Coordination Activities - No follow up needed       Care Coordination Interventions: Provided education to patient about basic DM disease process Reviewed medications with patient and discussed importance of medication adherence Counseled on importance of regular laboratory monitoring as prescribed Reviewed scheduled/upcoming provider appointments including: Pharmacy outreach on 1/24, pulmonary on 4/1 and PCP visit on 5/7 Advised patient, providing education and rationale, to check cbg daily and record, calling provider for findings outside established parameters Screening for signs and symptoms of depression related to chronic disease state  Assessed social determinant of health barriers Advised patient, providing education and rationale, to monitor blood pressure daily and record, calling PCP for findings outside established parameters Discussed complications of poorly controlled blood pressure such as heart disease, stroke, circulatory complications, vision complications, kidney impairment, sexual dysfunction Monitors blood pressure and blood sugar daily, readings today were 120/68 and 116. Reviewed most recent A1C of 7.3, state provider is satisfied with this result         SDOH assessments and interventions completed:  Yes  SDOH Interventions Today    Flowsheet Row Most Recent Value  SDOH Interventions   Food Insecurity Interventions Intervention Not  Indicated  Housing Interventions Intervention Not Indicated  Transportation Interventions Intervention Not Indicated        Care Coordination Interventions:  Yes, provided   Follow up plan: No further intervention required.   Encounter Outcome:  Pt. Visit Completed   Valente David, RN, MSN, [place] Care Management Care Management Coordinator [phone]

## 2022-05-20 ENCOUNTER — Telehealth: Payer: Self-pay

## 2022-05-20 NOTE — Telephone Encounter (Signed)
Care Management & Coordination Services Outreach Note  05/20/2022 Name: Julie Keith MRN: [id number] DOB: Jun 19, 1945  Referred by: Delsa Grana, PA-C  Patient had a phone appointment scheduled with clinical pharmacist today.  An unsuccessful telephone outreach was attempted today. The patient was referred to the pharmacist for assistance with medications, care management and care coordination.   Patient will NOT be penalized in any way for missing a Care Management & Coordination Services appointment. The no-show fee does not apply.  [name], [place] Pharmacist Practitioner  [facility] [phone]

## 2022-05-20 NOTE — Progress Notes (Unsigned)
Care Management & Coordination Services Pharmacy Note  05/20/2022 Name:  Julie Keith MRN:  [id number] DOB:  1946/02/17  Summary: ***  Recommendations/Changes made from today's visit: ***  Follow up plan: ***   Subjective: Julie Keith is an 77 y.o. year old female who is a primary patient of Julie Keith, [place].  The care coordination team was consulted for assistance with disease management and care coordination needs.    Engaged with patient by telephone for follow up visit.  Recent office visits: 03/10/22: Patient presented to Julie Keith for COPD Exacerbation. Doxycycline + prednisone.   Recent consult visits: 04/23/2022: Patient presented to Julie Faith, PA-C (Cardiology)   Hospital visits: {Hospital DC Yes/No:25215}   Objective:  Lab Results  Component Value Date   CREATININE 1.15 (H) 02/23/2022   BUN 26 (H) 02/23/2022   EGFR 49 (L) 02/23/2022   GFRNONAA 42 (L) 11/29/2021   GFRAA 55 (L) 04/15/2020   NA 138 02/23/2022   K 5.8 (H) 02/23/2022   CALCIUM 9.9 02/23/2022   CO2 32 02/23/2022   GLUCOSE 149 (H) 02/23/2022    Lab Results  Component Value Date/Time   HGBA1C 7.3 (H) 02/23/2022 09:32 AM   HGBA1C 7.5 (H) 08/22/2021 08:38 AM   HGBA1C 7.5 01/10/2016 12:00 AM   MICROALBUR 1.1 02/23/2022 09:32 AM   MICROALBUR 2.1 04/15/2020 11:16 AM   MICROALBUR 100 03/05/2015 07:47 AM    Last diabetic Eye exam:  Lab Results  Component Value Date/Time   HMDIABEYEEXA No Retinopathy 02/16/2022 12:00 AM    Last diabetic Foot exam:  Lab Results  Component Value Date/Time   HMDIABFOOTEX Normal 11/07/2021 12:00 AM     Lab Results  Component Value Date   CHOL 188 02/23/2022   HDL 64 02/23/2022   LDLCALC 106 (H) 02/23/2022   TRIG 89 02/23/2022   CHOLHDL 2.9 02/23/2022       Latest Ref Rng & Units 02/23/2022    9:32 AM 11/29/2021   10:05 PM 08/22/2021    8:38 AM  Hepatic Function  Total Protein 6.1 - 8.1 g/dL 7.2  7.8  7.1   Albumin 3.5 - 5.0 g/dL  3.4    AST  10 - 35 U/L 13  31  13    ALT 6 - 29 U/L 11  16  9    Alk Phosphatase 38 - 126 U/L  97    Total Bilirubin 0.2 - 1.2 mg/dL 0.4  0.6  0.4     Lab Results  Component Value Date/Time   TSH 1.63 11/08/2020 10:25 AM   TSH 1.45 07/29/2016 08:42 AM       Latest Ref Rng & Units 02/23/2022    9:32 AM 11/30/2021    5:40 AM 11/29/2021   10:05 PM  CBC  WBC 3.8 - 10.8 Thousand/uL 11.4  5.6  5.0   Hemoglobin 11.7 - 15.5 g/dL 13.2  9.7  11.0   Hematocrit 35.0 - 45.0 % 41.0  31.8  35.9   Platelets 140 - 400 Thousand/uL 390  282  348     No results found for: "VD25OH", "VITAMINB12"  Clinical ASCVD: {YES/NO:21197} The 10-year ASCVD risk score (Arnett DK, et al., 2019) is: 43.9%   Values used to calculate the score:     Age: 60 years     Sex: Female     Is Non-Hispanic African American: No     Diabetic: Yes     Tobacco smoker: No     Systolic Blood Pressure: 427  mmHg     Is BP treated: Yes     HDL Cholesterol: 64 mg/dL     Total Cholesterol: 188 mg/dL    ***Other: (CHADS2VASc if Afib, MMRC or CAT for COPD, ACT, DEXA)     03/10/2022    2:39 PM 02/23/2022    8:44 AM 01/08/2022    9:43 AM  Depression screen PHQ 2/9  Decreased Interest 0 0 0  Down, Depressed, Hopeless 0 0 0  PHQ - 2 Score 0 0 0  Altered sleeping 0 0 0  Tired, decreased energy 0 0 0  Change in appetite 0 0 0  Feeling bad or failure about yourself  0 0 0  Trouble concentrating 0 0 0  Moving slowly or fidgety/restless 0 0 0  Suicidal thoughts 0 0 0  PHQ-9 Score 0 0 0  Difficult doing work/chores Not difficult at all Not difficult at all      Social History   Tobacco Use  Smoking Status Former   Packs/day: 2.00   Years: 30.00   Total pack years: 60.00   Types: Cigarettes   Quit date: 1990   Years since quitting: 34.1  Smokeless Tobacco Never   BP Readings from Last 3 Encounters:  04/23/22 (!) 120/48  03/10/22 132/68  02/23/22 126/68   Pulse Readings from Last 3 Encounters:  04/23/22 (!) 59  03/10/22 74   02/23/22 70   Wt Readings from Last 3 Encounters:  04/23/22 206 lb (93.4 kg)  03/10/22 204 lb 3.2 oz (92.6 kg)  02/23/22 206 lb (93.4 kg)   BMI Readings from Last 3 Encounters:  04/23/22 34.28 kg/m  03/10/22 33.98 kg/m  02/23/22 34.28 kg/m    Allergies  Allergen Reactions   Amlodipine     Lower extremity swelling   Metformin And Related     headaches    Medications Reviewed Today     Reviewed by Julie Keith (Physician Assistant Certified) on 54/00/86 at 1  Med List Status: <None>   Medication Order Taking? Sig Documenting Provider Last Dose Status Informant  acetaminophen (TYLENOL) 500 MG tablet [id number] Yes Take 1,000 mg by mouth every 8 (eight) hours as needed for moderate pain. [provider] Taking Active Self, Pharmacy Records, Multiple Informants, Other  albuterol (VENTOLIN HFA) 108 (90 Base) MCG/ACT inhaler [id number] Yes Inhale 2 puffs into the lungs every 6 (six) hours as needed for wheezing or shortness of breath. Julie Medici, DO Taking Active   Apremilast (OTEZLA) 30 MG TABS [id number] Yes Take 30 mg by mouth in the morning and at bedtime. [provider] Taking Active Pharmacy Records, Multiple Informants, Other, Self  aspirin 81 MG chewable tablet [id number] Yes Chew 1 tablet (81 mg total) by mouth 2 (two) times daily. Julie Spry, PA-C Taking Active Self, Pharmacy Records, Multiple Informants, Other  carvedilol (COREG) 6.25 MG tablet [id number] Yes Take 1 tablet (6.25 mg total) by mouth 2 (two) times daily. Julie Gianotti, NP Taking Active Pharmacy Records, Multiple Informants, Other, Self  doxepin (SINEQUAN) 50 MG capsule [id number] Yes Take 50 mg by mouth at bedtime. [provider] Taking Active   enalapril (VASOTEC) 20 MG tablet [id number] Yes Take 1 tablet (20 mg total) by mouth 2 (two) times daily. Julie Grana, PA-C Taking Active Pharmacy Records, Multiple Informants, Other, Self  EQUATE STOOL SOFTENER  100 MG capsule [id number] Yes Take 100 mg by mouth 2 (two) times daily. [provider] Taking Active Pharmacy Records, Multiple Informants, Other, Self  fluticasone (FLONASE) 50 MCG/ACT nasal spray [id number] Yes Place 2 sprays into both nostrils daily. Julie Merino, FNP Taking Active Self, Pharmacy Records, Multiple Informants, Other  Fluticasone-Umeclidin-[facility] ELLIPTA) 100-62.5-25 MCG/ACT AEPB [id number] Yes Inhale 1 puff into the lungs daily. Allyne Gee, MD Taking Active Pharmacy Records, Multiple Informants, Other, Self  glimepiride (AMARYL) 4 MG tablet [id number] Yes Take 1 tablet (4 mg total) by mouth daily with breakfast. Julie Grana, PA-C Taking Active Pharmacy Records, Multiple Informants, Other, Self  glucose blood (ONETOUCH ULTRA) test strip [id number] Yes USE 1 STRIP TO CHECK GLUCOSE IN THE MORNING, AT NOON, AND AT BEDTIME. Julie Merino, FNP Taking Active Pharmacy Records, Multiple Informants, Other, Self  hydrochlorothiazide (HYDRODIURIL) 25 MG tablet [id number] Yes Take 1 tablet (25 mg total) by mouth daily. Julie Grana, PA-C Taking Active Pharmacy Records, Multiple Informants, Other, Self  hydrOXYzine (ATARAX) 25 MG tablet [id number] Yes Take 25 mg by mouth daily. [provider] Taking Active   ipratropium-albuterol (DUONEB) 0.5-2.5 (3) MG/3ML SOLN [id number] Yes Take 3 mLs by nebulization every 6 (six) hours as needed (cough, wheeze, shortness of breath). Julie Grana, PA-C Taking Active Pharmacy Records, Multiple Informants, Other, Self  nystatin cream (MYCOSTATIN) [id number] Yes Apply topically. [provider] Taking Active Pharmacy Records, Multiple Informants, Other, Self  OXYGEN [id number] Yes Inhale 2 L into the lungs at bedtime. Uses APRIA at night [provider] Taking Active Pharmacy Records, Multiple Informants, Other, Self  pantoprazole (PROTONIX) 20 MG tablet [id number] Yes Take 1 tablet (20 mg total) by mouth daily. Julie Grana, PA-C Taking Active Pharmacy Records, Multiple Informants, Other, Self  pravastatin (PRAVACHOL) 80 MG tablet [id number] Yes Take 1 tablet by mouth once daily Julie Grana, PA-C Taking Active             SDOH:  (Social Determinants of Health) assessments and interventions performed: {yes/no:20286} SDOH Interventions    Flowsheet Row Telephone from 05/06/2022 in [place] from 01/08/2022 in [place] Management from 05/07/2021 in [facility]  SDOH Interventions     Food Insecurity Interventions Intervention Not Indicated Intervention Not Indicated --  Housing Interventions Intervention Not Indicated Intervention Not Indicated --  Transportation Interventions Intervention Not Indicated Intervention Not Indicated --  Utilities Interventions -- Intervention Not Indicated --  Alcohol Usage Interventions -- Intervention Not Indicated (Score <7) --  Financial Strain Interventions -- Intervention Not Indicated Intervention Not Indicated  Physical Activity Interventions -- Intervention Not Indicated --  Stress Interventions -- Intervention Not Indicated --  Social Connections Interventions -- Intervention Not Indicated --       Medication Assistance: {MEDASSISTANCEINFO:25044}  Medication Access: Within the past 30 days, how often has patient missed a dose of medication? *** Is a pillbox or other method used to improve adherence? {YES/NO:21197} Factors that may affect medication adherence? {CHL DESC; BARRIERS:21522} Are meds synced by current pharmacy? {YES/NO:21197} Are meds delivered by current pharmacy? {YES/NO:21197} Does patient experience delays in picking up medications due to transportation concerns? {YES/NO:21197}  [facility] Reviewed: Is patient disadvantaged to use [facility]?: {YES/NO:21197} Current Rx insurance plan: *** Name and  location of Current pharmacy:  [facility] [place], [place] - [place] [address] Phone: [phone] Fax: [phone]  [facility] services reviewed with patient today?: {YES/NO:21197} Patient requests to transfer care to [facility]?: {YES/NO:21197} Reason patient declined to change pharmacies: {US patient  preference:27474}  Compliance/Adherence/Medication fill history: Care Gaps: ***  Star-Rating Drugs: ***   Assessment/Plan  Hypertension (BP goal <140/90) -Controlled -Current treatment: Carvedilol 6.25 mg twice daily: Appropriate, Effective, Safe, Accessible Enalapril 20 mg twice daily: Appropriate, Effective, Safe, Accessible  HCTZ 25 mg daily: Appropriate, Effective, Safe, Accessible  -Medications previously tried: NA  -History of tachycardia  -Current home readings: 120s/70s  -Denies hypotensive/hypertensive symptoms -Recommended to continue current medication  Hyperlipidemia: (LDL goal < 70) -Uncontrolled -Current treatment: Pravastatin 80 mg daily: Appropriate, Effective, Safe, Accessible  -Current treatment: Aspirin 81 mg twice daily: Appropriate, Effective, Safe, Accessible  -Medications previously tried: NA  -Recommended to continue current medication  Diabetes (A1c goal <7%) -Controlled -Current medications: Glimepiride 4 mg daily: Appropriate, Effective, Safe, Accessible  -Medications previously tried: Actos, Metformin (headaches)   -Current home glucose readings fasting glucose: 116,  -Denies hypoglycemic/hyperglycemic symptoms -Recommended to continue current medication  GERD (Goal: Prevent heartburn) -Controlled -Current treatment  Pantoprazole 20 mg daily: Appropriate, Effective, Safe, Accessible  -Medications previously tried: NA  -Hasn't had symptoms, could consider taper.  -Recommended to continue current medication  COPD (Goal: control symptoms and prevent exacerbations) -Not  ideally controlled -O2 dependent at night.  -Current treatment  Albuterol HFA: Appropriate, Effective, Safe, Accessible  Trelegy 1 puff daily  -Medications previously tried: NA  -Gold Grade: Gold 3 (FEV1 30-49%), group B  -MMRC/CAT score: 2 -Does report intermittent cough.  -Pulmonary function testing: FEV 47% (2023) -Exacerbations requiring treatment in last 6 months: Feb 2023 -Patient denies consistent use of maintenance inhaler. She was prescribed Trelegy by pulmonology and picked up one inhaler back in July, but has not started using the medicine due to concerns with the cost. We discussed at length the benefits of daily maintenance use. Unfortunately, she will likely have a hard time meeting the requirements for patient assistance for trelegy.   -START using Trelegy 1 puff daily. Will reach out to pulmonology to discuss switching patient to [facility] once patient finishes current supply of Trelegy.   Psoriatic Arthritis  (Goal: Minimize pain symptoms) -Controlled -managed by Dr. Cheree Ditto -Current treatment  Acetaminophen 500 mg 2-5 tablets twice daily as needed: Appropriate, Effective, Safe, Accessible Apremilast 30 mg twice daily: Appropriate, Effective, Safe, Accessible -Medications previously tried:   -Hasn't needed tylenol recently. -Recommended to continue current medication  Chronic Kidney Disease Stage 3a  -All medications assessed for renal dosing and appropriateness in chronic kidney disease. -Recommended to continue current medication  ***

## 2022-07-20 ENCOUNTER — Ambulatory Visit: Payer: Medicare Other | Admitting: Internal Medicine

## 2022-08-07 ENCOUNTER — Other Ambulatory Visit: Payer: Self-pay

## 2022-08-07 ENCOUNTER — Other Ambulatory Visit: Payer: Self-pay | Admitting: Family Medicine

## 2022-08-07 DIAGNOSIS — K219 Gastro-esophageal reflux disease without esophagitis: Secondary | ICD-10-CM

## 2022-08-07 MED ORDER — PANTOPRAZOLE SODIUM 20 MG PO TBEC: 20.0000 mg | DELAYED_RELEASE_TABLET | Freq: Every day | ORAL | 3 refills | Status: DC

## 2022-08-07 MED ORDER — CARVEDILOL 6.25 MG PO TABS: 6.2500 mg | ORAL_TABLET | Freq: Two times a day (BID) | ORAL | 2 refills | Status: DC

## 2022-08-07 NOTE — Telephone Encounter (Signed)
Medication Refill - Medication: pantoprazole (PROTONIX) 20 MG tablet carvedilol (COREG) 6.25 MG tablet   Pt states that she would like any other medication that is needing to be called in to send it over to the pharmacy as well.  Pt states she has two or three days left of medication.   Has the patient contacted their pharmacy? Yes.   Pharmacy advised pt to reach out to PCP.   Preferred Pharmacy (with phone number or street name): [facility] [address], [place] - [address] Phone: [phone] Fax: [phone]  Has the patient been seen for an appointment in the last year OR does the patient have an upcoming appointment? Yes.    Agent: Please be advised that RX refills may take up to 3 business days. We ask that you follow-up with your pharmacy.

## 2022-08-25 ENCOUNTER — Ambulatory Visit: Payer: Medicare Other | Admitting: Family Medicine

## 2022-09-16 DIAGNOSIS — L409 Psoriasis, unspecified: Secondary | ICD-10-CM | POA: Diagnosis not present

## 2022-09-16 DIAGNOSIS — J9610 Chronic respiratory failure, unspecified whether with hypoxia or hypercapnia: Secondary | ICD-10-CM | POA: Diagnosis not present

## 2022-09-18 ENCOUNTER — Other Ambulatory Visit: Payer: Self-pay | Admitting: Family Medicine

## 2022-09-18 DIAGNOSIS — I10 Essential (primary) hypertension: Secondary | ICD-10-CM

## 2022-09-18 DIAGNOSIS — E119 Type 2 diabetes mellitus without complications: Secondary | ICD-10-CM

## 2022-09-18 MED ORDER — ENALAPRIL MALEATE 20 MG PO TABS: 20.0000 mg | ORAL_TABLET | Freq: Two times a day (BID) | ORAL | 0 refills | Status: DC

## 2022-09-18 MED ORDER — GLIMEPIRIDE 4 MG PO TABS: 4.0000 mg | ORAL_TABLET | Freq: Every day | ORAL | 0 refills | Status: DC

## 2022-09-18 NOTE — Telephone Encounter (Signed)
Requested Prescriptions  Pending Prescriptions Disp Refills   glimepiride (AMARYL) 4 MG tablet 90 tablet 0    Sig: Take 1 tablet (4 mg total) by mouth daily with breakfast.     Endocrinology:  Diabetes - Sulfonylureas Failed - 09/18/2022  3:47 PM      Failed - HBA1C is between 0 and 7.9 and within 180 days    Hemoglobin A1C  Date Value Ref Range Status  01/10/2016 7.5  Final   Hgb A1c MFr Bld  Date Value Ref Range Status  02/23/2022 7.3 (H) <5.7 % of total Hgb Final    Comment:    For someone without known diabetes, a hemoglobin A1c value of 6.5% or greater indicates that they may have  diabetes and this should be confirmed with a follow-up  test. . For someone with known diabetes, a value <7% indicates  that their diabetes is well controlled and a value  greater than or equal to 7% indicates suboptimal  control. A1c targets should be individualized based on  duration of diabetes, age, comorbid conditions, and  other considerations. . Currently, no consensus exists regarding use of hemoglobin A1c for diagnosis of diabetes for children. .          Failed - Cr in normal range and within 360 days    Creat  Date Value Ref Range Status  02/23/2022 1.15 (H) 0.60 - 1.00 mg/dL Final   Creatinine, Urine  Date Value Ref Range Status  02/23/2022 171 20 - 275 mg/dL Final         Failed - Valid encounter within last 6 months    Recent Outpatient Visits           6 months ago Chronic obstructive pulmonary disease with acute exacerbation [facility])   [facility] Margarita Mail, DO   6 months ago Essential hypertension   [place] [facility] Danelle Berry, PA-C   9 months ago COVID-19   [facility] Berniece Salines, FNP   9 months ago Bronchospasm with bronchitis, acute   [facility] Danelle Berry, PA-C   1 year ago Type 2 diabetes mellitus without complication, without long-term  current use of insulin [facility])   [place] [facility] Danelle Berry, PA-C       Future Appointments             In 6 days Danelle Berry, PA-C [facility]   In 3 months  [facility]             enalapril (VASOTEC) 20 MG tablet 180 tablet 0    Sig: Take 1 tablet (20 mg total) by mouth 2 (two) times daily.     Cardiovascular:  ACE Inhibitors Failed - 09/18/2022  3:47 PM      Failed - Cr in normal range and within 180 days    Creat  Date Value Ref Range Status  02/23/2022 1.15 (H) 0.60 - 1.00 mg/dL Final   Creatinine, Urine  Date Value Ref Range Status  02/23/2022 171 20 - 275 mg/dL Final         Failed - K in normal range and within 180 days    Potassium  Date Value Ref Range Status  02/23/2022 5.8 (H) 3.5 - 5.3 mmol/L Final  03/30/2013 4.4 3.5 - 5.1 mmol/L Final         Failed - Last BP in  normal range    BP Readings from Last 1 Encounters:  04/23/22 (!) 120/48         Failed - Valid encounter within last 6 months    Recent Outpatient Visits           6 months ago Chronic obstructive pulmonary disease with acute exacerbation [facility])   [place] [facility] Margarita Mail, DO   6 months ago Essential hypertension   [place] [facility] Danelle Berry, PA-C   9 months ago COVID-19   [facility] Berniece Salines, FNP   9 months ago Bronchospasm with bronchitis, acute   [facility] Danelle Berry, PA-C   1 year ago Type 2 diabetes mellitus without complication, without long-term current use of insulin [facility])   [place] Danelle Berry, PA-C       Future Appointments             In 6 days Danelle Berry, PA-C [facility], [facility]   In 3 months  [facility]            Passed - Patient is not pregnant

## 2022-09-18 NOTE — Telephone Encounter (Signed)
Medication Refill - Medication: glimepiride (AMARYL) 4 MG tablet enalapril (VASOTEC) 20 MG tablet   Has the patient contacted their pharmacy? No. Huntley Dec with [facility] is calling in requesting the refills for the pt.    Preferred Pharmacy (with phone number or street name):  [facility]) [facility] - [place], [place] - [address]  Phone: [phone] Fax: [phone]  Has the patient been seen for an appointment in the last year OR does the patient have an upcoming appointment? Yes.    Agent: Please be advised that RX refills may take up to 3 business days. We ask that you follow-up with your pharmacy.

## 2022-09-22 LAB — BASIC METABOLIC PANEL: Creatinine: 1 (ref 0.5–1.1)

## 2022-09-22 LAB — COMPREHENSIVE METABOLIC PANEL: eGFR: 59

## 2022-09-22 IMAGING — DX DG KNEE 1-2V PORT*L*
2 series · 2 of 2 positions shown · non-contrast
Comparison: 03/09/2019

CLINICAL DATA: Status post total knee replacement.

EXAM:
PORTABLE LEFT KNEE - 1-2 VIEW

[knee ap]
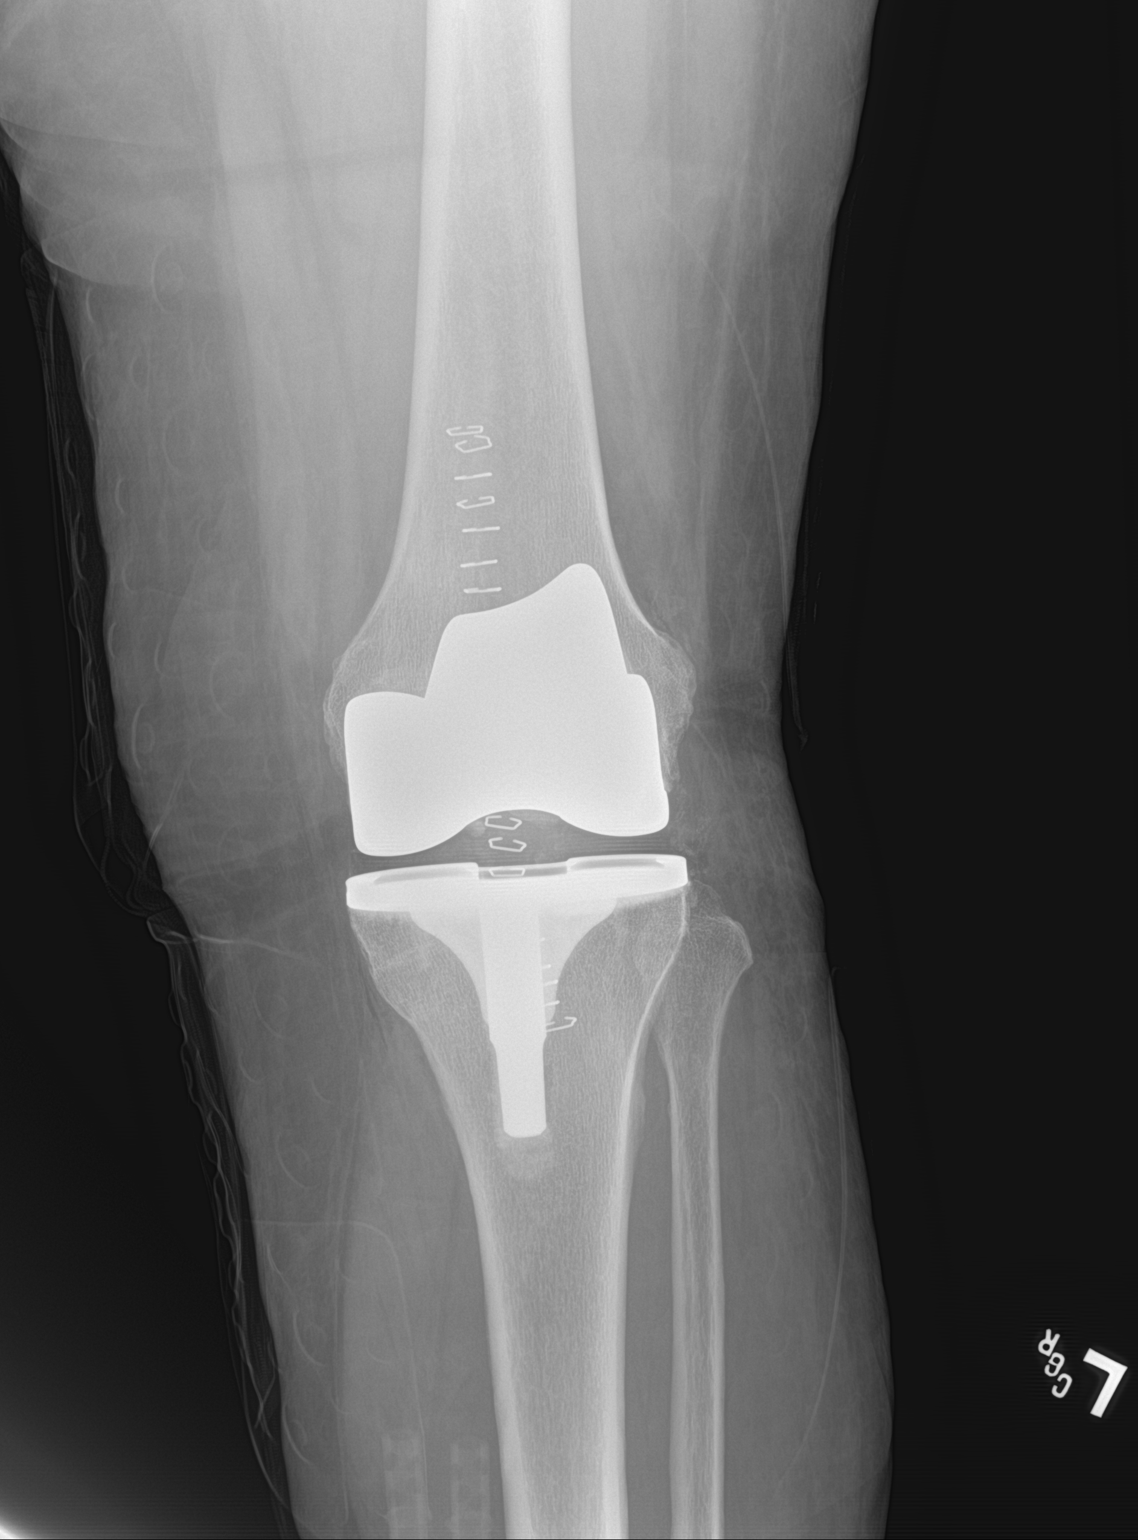

[knee lat]
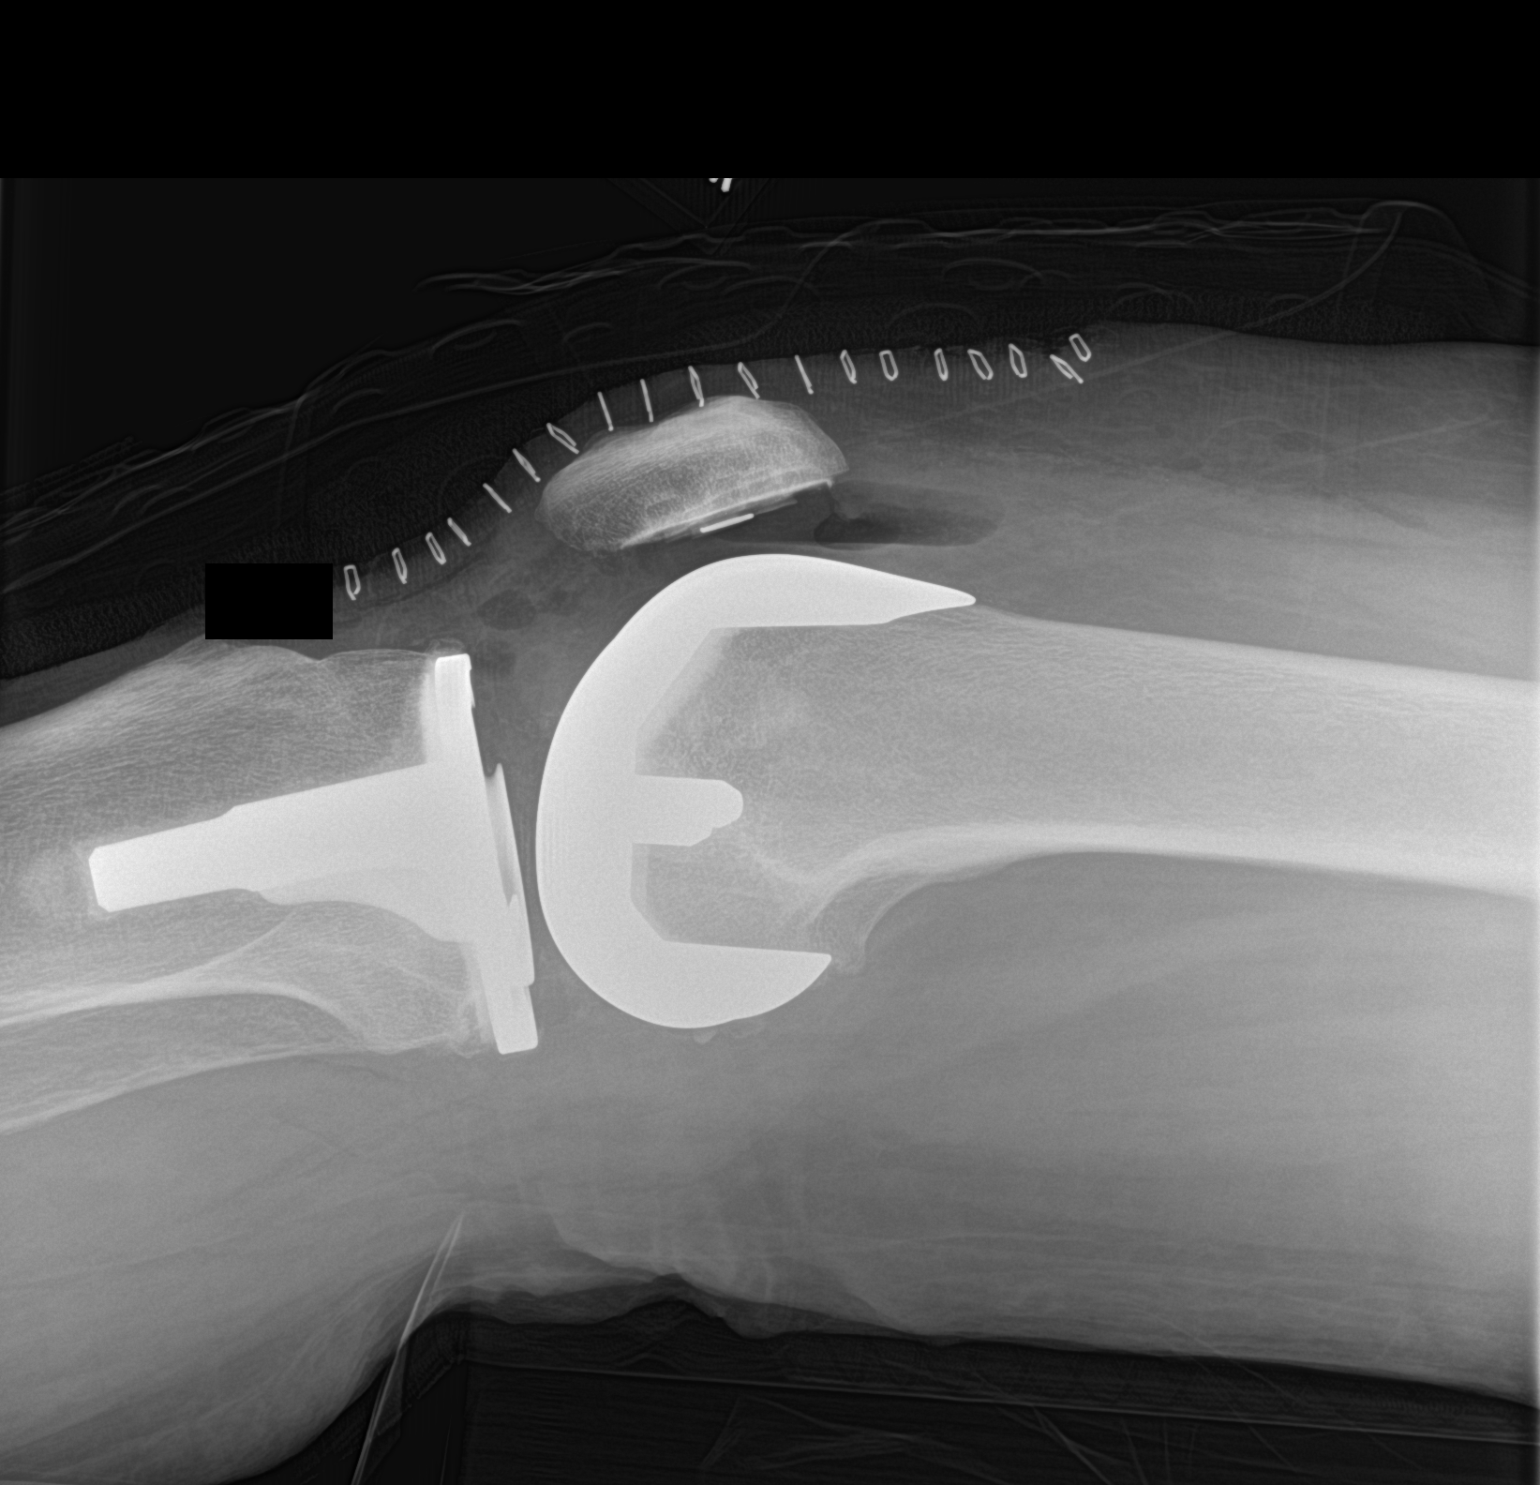

[2 of 2 positions shown; findings below may reference images not displayed]

FINDINGS: Two views of the left knee demonstrate a total knee arthroplasty.
The knee is located without a periprosthetic fracture. There is
expected gas in the soft tissues and suprapatellar region. Skin
staples along the anterior knee.
IMPRESSION: Expected changes from a left total knee arthroplasty. No
complicating features.
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
## 2022-09-24 ENCOUNTER — Ambulatory Visit (INDEPENDENT_AMBULATORY_CARE_PROVIDER_SITE_OTHER): Payer: Medicare Other | Admitting: Family Medicine

## 2022-09-24 ENCOUNTER — Encounter: Payer: Self-pay | Admitting: Family Medicine

## 2022-09-24 VITALS — BP 136/72 | HR 82 | Temp 98.4°F | Resp 18 | Ht 65.0 in | Wt 219.3 lb

## 2022-09-24 DIAGNOSIS — E119 Type 2 diabetes mellitus without complications: Secondary | ICD-10-CM | POA: Diagnosis not present

## 2022-09-24 DIAGNOSIS — K219 Gastro-esophageal reflux disease without esophagitis: Secondary | ICD-10-CM | POA: Diagnosis not present

## 2022-09-24 DIAGNOSIS — E782 Mixed hyperlipidemia: Secondary | ICD-10-CM | POA: Diagnosis not present

## 2022-09-24 DIAGNOSIS — Z5181 Encounter for therapeutic drug level monitoring: Secondary | ICD-10-CM | POA: Diagnosis not present

## 2022-09-24 DIAGNOSIS — J9610 Chronic respiratory failure, unspecified whether with hypoxia or hypercapnia: Secondary | ICD-10-CM

## 2022-09-24 DIAGNOSIS — I10 Essential (primary) hypertension: Secondary | ICD-10-CM | POA: Diagnosis not present

## 2022-09-24 DIAGNOSIS — J439 Emphysema, unspecified: Secondary | ICD-10-CM

## 2022-09-24 DIAGNOSIS — Z6836 Body mass index (BMI) 36.0-36.9, adult: Secondary | ICD-10-CM

## 2022-09-24 DIAGNOSIS — L409 Psoriasis, unspecified: Secondary | ICD-10-CM

## 2022-09-24 DIAGNOSIS — N1832 Chronic kidney disease, stage 3b: Secondary | ICD-10-CM

## 2022-09-24 DIAGNOSIS — Z7984 Long term (current) use of oral hypoglycemic drugs: Secondary | ICD-10-CM

## 2022-09-24 DIAGNOSIS — J441 Chronic obstructive pulmonary disease with (acute) exacerbation: Secondary | ICD-10-CM

## 2022-09-24 LAB — CBC WITH DIFFERENTIAL/PLATELET
Absolute Monocytes: 621 cells/uL (ref 200–950)
Basophils Relative: 0.6 %
Eosinophils Relative: 2 %
HCT: 38.1 % (ref 35.0–45.0)
Hemoglobin: 12.1 g/dL (ref 11.7–15.5)
Lymphs Abs: 1950 cells/uL (ref 850–3900)
MCV: 89.2 fL (ref 80.0–100.0)
Monocytes Relative: 6.4 %
Neutro Abs: 6877 cells/uL (ref 1500–7800)

## 2022-09-25 ENCOUNTER — Ambulatory Visit: Payer: Medicare Other | Admitting: Family Medicine

## 2022-09-25 LAB — COMPLETE METABOLIC PANEL WITH GFR
AG Ratio: 1.3 (calc) (ref 1.0–2.5)
ALT: 10 U/L (ref 6–29)
AST: 14 U/L (ref 10–35)
Albumin: 3.9 g/dL (ref 3.6–5.1)
Alkaline phosphatase (APISO): 95 U/L (ref 37–153)
BUN: 14 mg/dL (ref 7–25)
CO2: 32 mmol/L (ref 20–32)
Calcium: 9.4 mg/dL (ref 8.6–10.4)
Chloride: 101 mmol/L (ref 98–110)
Creat: 0.97 mg/dL (ref 0.60–1.00)
Globulin: 2.9 g/dL (calc) (ref 1.9–3.7)
Glucose, Bld: 147 mg/dL — ABNORMAL HIGH (ref 65–99)
Potassium: 5.2 mmol/L (ref 3.5–5.3)
Sodium: 142 mmol/L (ref 135–146)
Total Bilirubin: 0.3 mg/dL (ref 0.2–1.2)
Total Protein: 6.8 g/dL (ref 6.1–8.1)
eGFR: 60 mL/min/{1.73_m2} (ref >=60)

## 2022-09-25 LAB — HEMOGLOBIN A1C
Hgb A1c MFr Bld: 7.8 % of total Hgb — ABNORMAL HIGH (ref <5.7)
Mean Plasma Glucose: 177 mg/dL
eAG (mmol/L): 9.8 mmol/L

## 2022-09-25 LAB — CBC WITH DIFFERENTIAL/PLATELET
Basophils Absolute: 58 cells/uL (ref 0–200)
Eosinophils Absolute: 194 cells/uL (ref 15–500)
MCH: 28.3 pg (ref 27.0–33.0)
MCHC: 31.8 g/dL — ABNORMAL LOW (ref 32.0–36.0)
MPV: 9.6 fL (ref 7.5–12.5)
Neutrophils Relative %: 70.9 %
Platelets: 384 10*3/uL (ref 140–400)
RBC: 4.27 10*6/uL (ref 3.80–5.10)
RDW: 12.7 % (ref 11.0–15.0)
Total Lymphocyte: 20.1 %
WBC: 9.7 10*3/uL (ref 3.8–10.8)

## 2022-09-25 LAB — LIPID PANEL
Cholesterol: 202 mg/dL — ABNORMAL HIGH (ref <200)
HDL: 64 mg/dL (ref >=50)
LDL Cholesterol (Calc): 113 mg/dL (calc) — ABNORMAL HIGH
Non-HDL Cholesterol (Calc): 138 mg/dL (calc) — ABNORMAL HIGH (ref <130)
Total CHOL/HDL Ratio: 3.2 (calc) (ref <5.0)
Triglycerides: 137 mg/dL (ref <150)

## 2022-10-01 ENCOUNTER — Encounter: Payer: Self-pay | Admitting: Family Medicine

## 2022-10-01 ENCOUNTER — Other Ambulatory Visit: Payer: Self-pay | Admitting: Family Medicine

## 2022-10-01 DIAGNOSIS — I10 Essential (primary) hypertension: Secondary | ICD-10-CM

## 2022-10-01 MED ORDER — PANTOPRAZOLE SODIUM 20 MG PO TBEC: 20.0000 mg | DELAYED_RELEASE_TABLET | Freq: Every day | ORAL | 3 refills | Status: DC

## 2022-10-01 MED ORDER — CARVEDILOL 6.25 MG PO TABS
6.2500 mg | ORAL_TABLET | Freq: Two times a day (BID) | ORAL | 1 refills | Status: DC
Start: 2022-10-01 — End: 2022-10-06

## 2022-10-01 MED ORDER — ROSUVASTATIN CALCIUM 10 MG PO TABS: 10.0000 mg | ORAL_TABLET | Freq: Every day | ORAL | 3 refills | Status: DC

## 2022-10-01 MED ORDER — HYDROCHLOROTHIAZIDE 25 MG PO TABS: 25.0000 mg | ORAL_TABLET | Freq: Every day | ORAL | 1 refills | Status: DC

## 2022-10-01 MED ORDER — PRAVASTATIN SODIUM 80 MG PO TABS: 80.0000 mg | ORAL_TABLET | Freq: Every day | ORAL | 0 refills | Status: DC

## 2022-10-01 NOTE — Assessment & Plan Note (Signed)
Overdue for recheck of labs, managed on glimepiride which we would want to get her off of to avoid hypoglycemia Previously fairly well-controlled for her age She is on an ACE inhibitor and statin, is up-to-date on diabetic foot exam and eye exam Her morning blood sugars are at goal 100-120's Recheck labs

## 2022-10-01 NOTE — Assessment & Plan Note (Signed)
Bp currently well controlled and near goal today, has been stable on current meds On enalapril, carvedilol and hydrochlorothiazide BP Readings from Last 3 Encounters:  09/24/22 136/72  04/23/22 (!) 120/48  03/10/22 132/68  Due for labs

## 2022-10-01 NOTE — Assessment & Plan Note (Signed)
Managed by specialist, on Otezla, symptoms stable and currently fairly well-controlled

## 2022-10-01 NOTE — Assessment & Plan Note (Signed)
Patient reports a excess of pravastatin refills and extra medication at home which does not match our med adherence records in the chart she reports having about 3 bottles at home and reports taking medications daily without any side effects or concerns Due for lipids

## 2022-10-01 NOTE — Assessment & Plan Note (Signed)
Currently on 2 L oxygen

## 2022-10-01 NOTE — Assessment & Plan Note (Signed)
Per pulmonology she has required the same amount of oxygen but is having more episodes of bronchitis recently -Per pulmonology/Dr. Jayme Cloud

## 2022-10-01 NOTE — Assessment & Plan Note (Signed)
She reports symptoms are stable and well-controlled on pantoprazole

## 2022-10-01 NOTE — Assessment & Plan Note (Signed)
Recheck renal function today, avoid NSAIDs

## 2022-10-06 ENCOUNTER — Ambulatory Visit (INDEPENDENT_AMBULATORY_CARE_PROVIDER_SITE_OTHER): Payer: Medicare Other | Admitting: Family Medicine

## 2022-10-06 ENCOUNTER — Telehealth: Payer: Self-pay

## 2022-10-06 ENCOUNTER — Encounter: Payer: Self-pay | Admitting: Family Medicine

## 2022-10-06 VITALS — BP 126/72 | HR 76 | Temp 97.7°F | Resp 16 | Ht 65.0 in | Wt 214.6 lb

## 2022-10-06 DIAGNOSIS — I10 Essential (primary) hypertension: Secondary | ICD-10-CM | POA: Diagnosis not present

## 2022-10-06 DIAGNOSIS — E782 Mixed hyperlipidemia: Secondary | ICD-10-CM

## 2022-10-06 DIAGNOSIS — E119 Type 2 diabetes mellitus without complications: Secondary | ICD-10-CM | POA: Diagnosis not present

## 2022-10-06 DIAGNOSIS — Z5181 Encounter for therapeutic drug level monitoring: Secondary | ICD-10-CM

## 2022-10-06 DIAGNOSIS — E1165 Type 2 diabetes mellitus with hyperglycemia: Secondary | ICD-10-CM

## 2022-10-06 DIAGNOSIS — Z79899 Other long term (current) drug therapy: Secondary | ICD-10-CM

## 2022-10-06 DIAGNOSIS — K219 Gastro-esophageal reflux disease without esophagitis: Secondary | ICD-10-CM

## 2022-10-06 DIAGNOSIS — Z7984 Long term (current) use of oral hypoglycemic drugs: Secondary | ICD-10-CM

## 2022-10-06 DIAGNOSIS — D84821 Immunodeficiency due to drugs: Secondary | ICD-10-CM

## 2022-10-06 MED ORDER — ENALAPRIL MALEATE 20 MG PO TABS
20.0000 mg | ORAL_TABLET | Freq: Two times a day (BID) | ORAL | 1 refills | Status: DC
Start: 2022-10-06 — End: 2023-02-03

## 2022-10-06 MED ORDER — GLIMEPIRIDE 4 MG PO TABS
4.0000 mg | ORAL_TABLET | Freq: Every day | ORAL | 0 refills | Status: DC
Start: 2022-10-06 — End: 2023-02-03

## 2022-10-06 MED ORDER — HYDROCHLOROTHIAZIDE 25 MG PO TABS
25.0000 mg | ORAL_TABLET | Freq: Every day | ORAL | 1 refills | Status: DC
Start: 2022-10-06 — End: 2023-02-03

## 2022-10-06 MED ORDER — PANTOPRAZOLE SODIUM 20 MG PO TBEC
20.0000 mg | DELAYED_RELEASE_TABLET | Freq: Every day | ORAL | 1 refills | Status: DC
Start: 2022-10-06 — End: 2023-02-03

## 2022-10-06 MED ORDER — ROSUVASTATIN CALCIUM 10 MG PO TABS
10.0000 mg | ORAL_TABLET | Freq: Every day | ORAL | 1 refills | Status: DC
Start: 2022-10-06 — End: 2023-02-03

## 2022-10-06 MED ORDER — CARVEDILOL 6.25 MG PO TABS: 6.2500 mg | ORAL_TABLET | Freq: Two times a day (BID) | ORAL | 1 refills | Status: DC

## 2022-10-06 NOTE — Telephone Encounter (Signed)
Called pharmacy to d/c (cancel) all rx with them per patient preference.
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
## 2022-10-06 NOTE — Assessment & Plan Note (Signed)
Reviewed past and current meds, allergies and discussed possible med options to improve glycemic control Jardiance/farxiga is a good option GLP-1 low dose may also help Past metformin intolerance - SE  Pt decided to keep meds the same and work with RD/diabetes educator and recheck in 3 months Reviewed risk of hypoglycemia with glipizide/sulfonylureas  Patient should follow-up sooner if morning fasting blood sugars are elevated often above 180+

## 2022-10-06 NOTE — Patient Instructions (Addendum)
Prescription still available to patient and pharmacy  Ensure the patient knows not to take or fill this prescription.  To stop the pharmacy from filling this prescription, you must contact it directly: [phone].    We will keep your diabetes medication the same for right now and recheck labs in 3 months Work with the registered dietician and continue the glipizide the same dose right now   Lab Results  Component Value Date   HGBA1C 7.8 (H) 09/24/2022   Lab Results  Component Value Date   CHOL 202 (H) 09/24/2022   HDL 64 09/24/2022   LDLCALC 113 (H) 09/24/2022   TRIG 137 09/24/2022   CHOLHDL 3.2 09/24/2022   We want to get your A1C down closer to 7.0 and your cholesterol and LDL improved

## 2022-10-06 NOTE — Assessment & Plan Note (Signed)
Lipids poorly controlled on pravastatin 40 (has been on 80 mg in the past) I have recommended trial of rosuvastatin 10 with recheck of lipids and tolerance in about 3-4 months

## 2022-10-08 ENCOUNTER — Encounter: Payer: Self-pay | Admitting: Family Medicine

## 2022-10-08 NOTE — Assessment & Plan Note (Signed)
On Otezla per rheumatology Recent viral illnesses have been prolonged and requiring several office visits or repeated antibiotic doses We have discussed this at length and we have also reviewed her recent lab work

## 2022-10-08 NOTE — Assessment & Plan Note (Signed)
Bp currently well controlled and near goal today, has been stable on current meds On enalapril, carvedilol and hydrochlorothiazide BP Readings from Last 3 Encounters:  10/06/22 126/72  09/24/22 136/72  04/23/22 (!) 120/48

## 2022-11-04 ENCOUNTER — Ambulatory Visit: Payer: Self-pay | Admitting: *Deleted

## 2022-11-04 NOTE — Telephone Encounter (Signed)
  Chief Complaint: [facility] calling in abnormal lab rsults Symptoms: N/A Frequency: N/A Pertinent Negatives: Patient denies N/A Disposition: [] ED /[] Urgent Care (no appt availability in office) / [] Appointment(In office/virtual)/ []  [facility]/ [] Home Care/ [] Refused Recommended Disposition /[]  Mobile Bus/ [x]  Follow-up with PCP Additional Notes: [facility] is required to call in any abnormal lab results.  See triage notes for values

## 2022-11-04 NOTE — Telephone Encounter (Signed)
Abnormal lab results through her health plan. [facility].   EGFR is abnormal.   EGRR 59 is low.    Creatinein 0.98  mg/dl    A letter was sent regarding this.      Reason for Disposition  Health Information question, no triage required and triager able to answer question  Answer Assessment - Initial Assessment Questions 1. REASON FOR CALL or QUESTION: "What is your reason for calling today?" or "How can I best help you?" or "What question do you have that I can help answer?"     Julie Keith with [employer] calling in abnormal labs.   [facility] is a service provided through her health insurance.   They come out yearly and do a check up their clients as part of the plan.  Her eGFR is 59.     Cr. Is 0.98  [facility] also sent a letter to the practice with this information in it also.   I let him know I would pass this information along to the provider Julie Acosta, PA-C  Protocols used: Information Only Call - No Triage-A-AH

## 2022-11-05 ENCOUNTER — Ambulatory Visit: Payer: Medicare Other | Admitting: Dietician

## 2022-12-20 DIAGNOSIS — E119 Type 2 diabetes mellitus without complications: Secondary | ICD-10-CM | POA: Diagnosis not present

## 2023-01-04 DIAGNOSIS — M1611 Unilateral primary osteoarthritis, right hip: Secondary | ICD-10-CM | POA: Diagnosis not present

## 2023-01-06 ENCOUNTER — Other Ambulatory Visit: Payer: Self-pay

## 2023-01-06 DIAGNOSIS — R0602 Shortness of breath: Secondary | ICD-10-CM

## 2023-01-08 DIAGNOSIS — E669 Obesity, unspecified: Secondary | ICD-10-CM | POA: Diagnosis not present

## 2023-01-08 DIAGNOSIS — E119 Type 2 diabetes mellitus without complications: Secondary | ICD-10-CM | POA: Diagnosis not present

## 2023-01-08 DIAGNOSIS — Z7982 Long term (current) use of aspirin: Secondary | ICD-10-CM | POA: Diagnosis not present

## 2023-01-08 DIAGNOSIS — I10 Essential (primary) hypertension: Secondary | ICD-10-CM | POA: Diagnosis not present

## 2023-01-08 DIAGNOSIS — F419 Anxiety disorder, unspecified: Secondary | ICD-10-CM | POA: Diagnosis not present

## 2023-01-08 DIAGNOSIS — Z87891 Personal history of nicotine dependence: Secondary | ICD-10-CM | POA: Diagnosis not present

## 2023-01-08 DIAGNOSIS — Z96643 Presence of artificial hip joint, bilateral: Secondary | ICD-10-CM | POA: Diagnosis not present

## 2023-01-08 DIAGNOSIS — Z7984 Long term (current) use of oral hypoglycemic drugs: Secondary | ICD-10-CM | POA: Diagnosis not present

## 2023-01-08 DIAGNOSIS — Z96652 Presence of left artificial knee joint: Secondary | ICD-10-CM | POA: Diagnosis not present

## 2023-01-08 DIAGNOSIS — Z471 Aftercare following joint replacement surgery: Secondary | ICD-10-CM | POA: Diagnosis not present

## 2023-01-08 DIAGNOSIS — Z9981 Dependence on supplemental oxygen: Secondary | ICD-10-CM | POA: Diagnosis not present

## 2023-01-08 DIAGNOSIS — J449 Chronic obstructive pulmonary disease, unspecified: Secondary | ICD-10-CM | POA: Diagnosis not present

## 2023-01-12 ENCOUNTER — Ambulatory Visit: Payer: Medicare Other

## 2023-01-13 ENCOUNTER — Telehealth: Payer: Self-pay | Admitting: Internal Medicine

## 2023-01-13 DIAGNOSIS — Z0189 Encounter for other specified special examinations: Secondary | ICD-10-CM | POA: Diagnosis not present

## 2023-01-13 NOTE — Telephone Encounter (Signed)
Left vm and sent mychart message to confirm 01/20/23 appointment & schedule f/u-Toni

## 2023-01-14 ENCOUNTER — Ambulatory Visit: Payer: Medicare Other | Admitting: Dietician

## 2023-01-14 DIAGNOSIS — Z87891 Personal history of nicotine dependence: Secondary | ICD-10-CM | POA: Diagnosis not present

## 2023-01-14 DIAGNOSIS — J449 Chronic obstructive pulmonary disease, unspecified: Secondary | ICD-10-CM | POA: Diagnosis not present

## 2023-01-14 DIAGNOSIS — E669 Obesity, unspecified: Secondary | ICD-10-CM | POA: Diagnosis not present

## 2023-01-14 DIAGNOSIS — Z7982 Long term (current) use of aspirin: Secondary | ICD-10-CM | POA: Diagnosis not present

## 2023-01-14 DIAGNOSIS — I10 Essential (primary) hypertension: Secondary | ICD-10-CM | POA: Diagnosis not present

## 2023-01-14 DIAGNOSIS — Z96643 Presence of artificial hip joint, bilateral: Secondary | ICD-10-CM | POA: Diagnosis not present

## 2023-01-14 DIAGNOSIS — E119 Type 2 diabetes mellitus without complications: Secondary | ICD-10-CM | POA: Diagnosis not present

## 2023-01-14 DIAGNOSIS — Z96652 Presence of left artificial knee joint: Secondary | ICD-10-CM | POA: Diagnosis not present

## 2023-01-14 DIAGNOSIS — Z9981 Dependence on supplemental oxygen: Secondary | ICD-10-CM | POA: Diagnosis not present

## 2023-01-14 DIAGNOSIS — Z471 Aftercare following joint replacement surgery: Secondary | ICD-10-CM | POA: Diagnosis not present

## 2023-01-14 DIAGNOSIS — Z7984 Long term (current) use of oral hypoglycemic drugs: Secondary | ICD-10-CM | POA: Diagnosis not present

## 2023-01-14 DIAGNOSIS — F419 Anxiety disorder, unspecified: Secondary | ICD-10-CM | POA: Diagnosis not present

## 2023-01-19 DIAGNOSIS — E119 Type 2 diabetes mellitus without complications: Secondary | ICD-10-CM | POA: Diagnosis not present

## 2023-01-20 ENCOUNTER — Ambulatory Visit: Payer: Medicare Other | Admitting: Internal Medicine

## 2023-01-20 DIAGNOSIS — Z7984 Long term (current) use of oral hypoglycemic drugs: Secondary | ICD-10-CM | POA: Diagnosis not present

## 2023-01-20 DIAGNOSIS — E119 Type 2 diabetes mellitus without complications: Secondary | ICD-10-CM | POA: Diagnosis not present

## 2023-01-20 DIAGNOSIS — J449 Chronic obstructive pulmonary disease, unspecified: Secondary | ICD-10-CM | POA: Diagnosis not present

## 2023-01-20 DIAGNOSIS — F419 Anxiety disorder, unspecified: Secondary | ICD-10-CM | POA: Diagnosis not present

## 2023-01-20 DIAGNOSIS — Z96652 Presence of left artificial knee joint: Secondary | ICD-10-CM | POA: Diagnosis not present

## 2023-01-20 DIAGNOSIS — Z9981 Dependence on supplemental oxygen: Secondary | ICD-10-CM | POA: Diagnosis not present

## 2023-01-20 DIAGNOSIS — Z7982 Long term (current) use of aspirin: Secondary | ICD-10-CM | POA: Diagnosis not present

## 2023-01-20 DIAGNOSIS — I10 Essential (primary) hypertension: Secondary | ICD-10-CM | POA: Diagnosis not present

## 2023-01-20 DIAGNOSIS — E669 Obesity, unspecified: Secondary | ICD-10-CM | POA: Diagnosis not present

## 2023-01-20 DIAGNOSIS — Z96643 Presence of artificial hip joint, bilateral: Secondary | ICD-10-CM | POA: Diagnosis not present

## 2023-01-20 DIAGNOSIS — Z87891 Personal history of nicotine dependence: Secondary | ICD-10-CM | POA: Diagnosis not present

## 2023-01-20 DIAGNOSIS — Z471 Aftercare following joint replacement surgery: Secondary | ICD-10-CM | POA: Diagnosis not present

## 2023-01-21 ENCOUNTER — Ambulatory Visit (INDEPENDENT_AMBULATORY_CARE_PROVIDER_SITE_OTHER): Payer: Medicare Other

## 2023-01-21 VITALS — Ht 65.0 in | Wt 214.0 lb

## 2023-01-21 DIAGNOSIS — Z0000 Encounter for general adult medical examination without abnormal findings: Secondary | ICD-10-CM | POA: Diagnosis not present

## 2023-01-21 DIAGNOSIS — Z23 Encounter for immunization: Secondary | ICD-10-CM

## 2023-01-21 NOTE — Patient Instructions (Signed)
Julie Keith , Thank you for taking time to come for your Medicare Wellness Visit. I appreciate your ongoing commitment to your health goals. Please review the following plan we discussed and let me know if I can assist you in the future.   Referrals/Orders/Follow-Ups/Clinician Recommendations: none  This is a list of the screening recommended for you and due dates:  Health Maintenance  Topic Date Due   DEXA scan (bone density measurement)  Never done   COVID-19 Vaccine (3 - Pfizer risk series) 02/06/2023*   Zoster (Shingles) Vaccine (1 of 2) 04/23/2023*   Flu Shot  07/19/2023*   Eye exam for diabetics  02/17/2023   Yearly kidney health urinalysis for diabetes  02/24/2023   Complete foot exam   02/24/2023   Hemoglobin A1C  03/26/2023   Yearly kidney function blood test for diabetes  09/24/2023   Medicare Annual Wellness Visit  01/21/2024   Pneumonia Vaccine  Completed   Hepatitis C Screening  Completed   HPV Vaccine  Aged Out   DTaP/Tdap/Td vaccine  Discontinued   Mammogram  Discontinued  *Topic was postponed. The date shown is not the original due date.    Advanced directives: (Copy Requested) Please bring a copy of your health care power of attorney and living will to the office to be added to your chart at your convenience.  Next Medicare Annual Wellness Visit scheduled for next year: Yes 01/27/24 @2 :20pm telephone
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
## 2023-01-27 DIAGNOSIS — F419 Anxiety disorder, unspecified: Secondary | ICD-10-CM | POA: Diagnosis not present

## 2023-01-27 DIAGNOSIS — Z7984 Long term (current) use of oral hypoglycemic drugs: Secondary | ICD-10-CM | POA: Diagnosis not present

## 2023-01-27 DIAGNOSIS — Z87891 Personal history of nicotine dependence: Secondary | ICD-10-CM | POA: Diagnosis not present

## 2023-01-27 DIAGNOSIS — I10 Essential (primary) hypertension: Secondary | ICD-10-CM | POA: Diagnosis not present

## 2023-01-27 DIAGNOSIS — Z96643 Presence of artificial hip joint, bilateral: Secondary | ICD-10-CM | POA: Diagnosis not present

## 2023-01-27 DIAGNOSIS — E669 Obesity, unspecified: Secondary | ICD-10-CM | POA: Diagnosis not present

## 2023-01-27 DIAGNOSIS — Z9981 Dependence on supplemental oxygen: Secondary | ICD-10-CM | POA: Diagnosis not present

## 2023-01-27 DIAGNOSIS — E119 Type 2 diabetes mellitus without complications: Secondary | ICD-10-CM | POA: Diagnosis not present

## 2023-01-27 DIAGNOSIS — J449 Chronic obstructive pulmonary disease, unspecified: Secondary | ICD-10-CM | POA: Diagnosis not present

## 2023-01-27 DIAGNOSIS — Z7982 Long term (current) use of aspirin: Secondary | ICD-10-CM | POA: Diagnosis not present

## 2023-01-27 DIAGNOSIS — Z96652 Presence of left artificial knee joint: Secondary | ICD-10-CM | POA: Diagnosis not present

## 2023-01-27 DIAGNOSIS — Z471 Aftercare following joint replacement surgery: Secondary | ICD-10-CM | POA: Diagnosis not present

## 2023-02-01 ENCOUNTER — Ambulatory Visit: Payer: Self-pay

## 2023-02-01 NOTE — Telephone Encounter (Signed)
3rd attempt, called Julie Keith,  at [place], [facility] to discuss medication regarding mutal pt. Unable to reach patient after 3 attempts by [facility] NT, routing to the provider for resolution per protocol.   Summary: Medication Management   Pharmacist unable to connect with patient and would like to confirm if patient is actively taking rosuvastatin (CRESTOR) 10 MG tablet

## 2023-02-01 NOTE — Telephone Encounter (Signed)
[employer] called, left VM for Julie Keith., Clinical Pharmacist to return the call to the office to speak to any nurse.   Summary: Medication Management   Pharmacist unable to connect with patient and would like to confirm if patient is actively taking rosuvastatin (CRESTOR) 10 MG tablet

## 2023-02-01 NOTE — Telephone Encounter (Signed)
Attempted to call Julie Keith, [employer]- no answer- left message on VM to contact office   I did speak with patient to confirm what she has in hand- she is not where she can actively check- but feels sure that is the medication/dosing she is currently taking- see lab note. She confirms recent change in medication.

## 2023-02-03 ENCOUNTER — Ambulatory Visit (INDEPENDENT_AMBULATORY_CARE_PROVIDER_SITE_OTHER): Payer: Medicare Other | Admitting: Physician Assistant

## 2023-02-03 ENCOUNTER — Encounter: Payer: Self-pay | Admitting: Physician Assistant

## 2023-02-03 VITALS — BP 126/72 | HR 70 | Temp 97.8°F | Resp 16 | Ht 65.0 in | Wt 205.0 lb

## 2023-02-03 DIAGNOSIS — I10 Essential (primary) hypertension: Secondary | ICD-10-CM

## 2023-02-03 DIAGNOSIS — N1832 Chronic kidney disease, stage 3b: Secondary | ICD-10-CM

## 2023-02-03 DIAGNOSIS — Z23 Encounter for immunization: Secondary | ICD-10-CM | POA: Diagnosis not present

## 2023-02-03 DIAGNOSIS — E1165 Type 2 diabetes mellitus with hyperglycemia: Secondary | ICD-10-CM

## 2023-02-03 DIAGNOSIS — J441 Chronic obstructive pulmonary disease with (acute) exacerbation: Secondary | ICD-10-CM

## 2023-02-03 DIAGNOSIS — E782 Mixed hyperlipidemia: Secondary | ICD-10-CM

## 2023-02-03 DIAGNOSIS — K219 Gastro-esophageal reflux disease without esophagitis: Secondary | ICD-10-CM

## 2023-02-03 DIAGNOSIS — J439 Emphysema, unspecified: Secondary | ICD-10-CM | POA: Diagnosis not present

## 2023-02-03 DIAGNOSIS — E119 Type 2 diabetes mellitus without complications: Secondary | ICD-10-CM

## 2023-02-03 DIAGNOSIS — Z7984 Long term (current) use of oral hypoglycemic drugs: Secondary | ICD-10-CM

## 2023-02-03 MED ORDER — PANTOPRAZOLE SODIUM 20 MG PO TBEC
20.0000 mg | DELAYED_RELEASE_TABLET | Freq: Every day | ORAL | 1 refills | Status: DC
Start: 2023-02-03 — End: 2023-08-04

## 2023-02-03 MED ORDER — ENALAPRIL MALEATE 20 MG PO TABS
20.0000 mg | ORAL_TABLET | Freq: Two times a day (BID) | ORAL | 1 refills | Status: DC
Start: 2023-02-03 — End: 2023-08-04

## 2023-02-03 MED ORDER — ROSUVASTATIN CALCIUM 10 MG PO TABS
10.0000 mg | ORAL_TABLET | Freq: Every day | ORAL | 1 refills | Status: DC
Start: 2023-02-03 — End: 2023-08-04

## 2023-02-03 MED ORDER — GLIMEPIRIDE 4 MG PO TABS
4.0000 mg | ORAL_TABLET | Freq: Every day | ORAL | 0 refills | Status: DC
Start: 2023-02-03 — End: 2023-05-20

## 2023-02-03 MED ORDER — CARVEDILOL 6.25 MG PO TABS
6.2500 mg | ORAL_TABLET | Freq: Two times a day (BID) | ORAL | 1 refills | Status: DC
Start: 2023-02-03 — End: 2023-08-04

## 2023-02-03 MED ORDER — HYDROCHLOROTHIAZIDE 25 MG PO TABS
25.0000 mg | ORAL_TABLET | Freq: Every day | ORAL | 1 refills | Status: DC
Start: 2023-02-03 — End: 2023-08-04
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
## 2023-02-04 DIAGNOSIS — Z471 Aftercare following joint replacement surgery: Secondary | ICD-10-CM | POA: Diagnosis not present

## 2023-02-04 DIAGNOSIS — E669 Obesity, unspecified: Secondary | ICD-10-CM | POA: Diagnosis not present

## 2023-02-04 DIAGNOSIS — Z7982 Long term (current) use of aspirin: Secondary | ICD-10-CM | POA: Diagnosis not present

## 2023-02-04 DIAGNOSIS — Z7984 Long term (current) use of oral hypoglycemic drugs: Secondary | ICD-10-CM | POA: Diagnosis not present

## 2023-02-04 DIAGNOSIS — I10 Essential (primary) hypertension: Secondary | ICD-10-CM | POA: Diagnosis not present

## 2023-02-04 DIAGNOSIS — Z87891 Personal history of nicotine dependence: Secondary | ICD-10-CM | POA: Diagnosis not present

## 2023-02-04 DIAGNOSIS — J449 Chronic obstructive pulmonary disease, unspecified: Secondary | ICD-10-CM | POA: Diagnosis not present

## 2023-02-04 DIAGNOSIS — Z96643 Presence of artificial hip joint, bilateral: Secondary | ICD-10-CM | POA: Diagnosis not present

## 2023-02-04 DIAGNOSIS — E119 Type 2 diabetes mellitus without complications: Secondary | ICD-10-CM | POA: Diagnosis not present

## 2023-02-04 DIAGNOSIS — Z9981 Dependence on supplemental oxygen: Secondary | ICD-10-CM | POA: Diagnosis not present

## 2023-02-04 DIAGNOSIS — F419 Anxiety disorder, unspecified: Secondary | ICD-10-CM | POA: Diagnosis not present

## 2023-02-04 DIAGNOSIS — Z96652 Presence of left artificial knee joint: Secondary | ICD-10-CM | POA: Diagnosis not present

## 2023-02-04 NOTE — Assessment & Plan Note (Signed)
Chronic, historic condition Appears well managed with current regimen comprised of hydrochlorothiazide 25 mg po every day, enalapril 20 mg po BID, carvedilol 6.25 mg PO BID Continue current regimen Refills provided today  Follow up in 3 months or sooner if concerns arise

## 2023-02-04 NOTE — Assessment & Plan Note (Signed)
Chronic, historic condition Appears to be tolerating regimen comprised of Glimepiride 4 mg PO every day but reports some lows when checking glucose Most recent A1c was 7.8  Recheck today  Results to dictate further management  Would like to switch her to something with lower risk of hypoglycemia - potentially  farxiga or jardiance for kidney health as well Follow up in 3 months or sooner if concerns arise

## 2023-02-04 NOTE — Assessment & Plan Note (Signed)
Chronic, historic condition Appears well managed on current regimen comprised of Rosuvastatin 10 mg POQD Continue current regimen Recheck lipid panel today Follow up in 6 months or sooner if concerns arise

## 2023-02-04 NOTE — Assessment & Plan Note (Signed)
Most recent eGFR was 60  Recheck labs today Would prefer to manage diabetes with kidney protective agent such as Marcelline Deist or Jardiance  Results to dictate further management

## 2023-02-04 NOTE — Assessment & Plan Note (Signed)
Chronic, historic, stable Appears well controlled on current regimen comprised of protonix 20 mg PO every day  Continue current regimen Recommend to follow GERD- conscious diet and avoid trigger foods Follow up in 6 months or sooner if concerns arise

## 2023-02-04 NOTE — Assessment & Plan Note (Signed)
Chronic, historic condition Appears to be doing well today- she is not using trelegy or rescue inhaler but states she has supply of both Recommend she restarts Trelegy due to lung sounds, especially going into respiratory season She states she has not used rescue inhaler in over a year Is using o2 at night Follow up in 3 months or sooner if concerns arise

## 2023-02-05 ENCOUNTER — Ambulatory Visit: Payer: Medicare Other | Admitting: Family Medicine

## 2023-02-19 DIAGNOSIS — E119 Type 2 diabetes mellitus without complications: Secondary | ICD-10-CM | POA: Diagnosis not present

## 2023-02-23 DIAGNOSIS — J449 Chronic obstructive pulmonary disease, unspecified: Secondary | ICD-10-CM | POA: Diagnosis not present

## 2023-02-23 DIAGNOSIS — N183 Chronic kidney disease, stage 3 unspecified: Secondary | ICD-10-CM | POA: Diagnosis not present

## 2023-02-23 DIAGNOSIS — E119 Type 2 diabetes mellitus without complications: Secondary | ICD-10-CM | POA: Diagnosis not present

## 2023-02-23 DIAGNOSIS — M1611 Unilateral primary osteoarthritis, right hip: Secondary | ICD-10-CM | POA: Diagnosis not present

## 2023-02-24 DIAGNOSIS — M1611 Unilateral primary osteoarthritis, right hip: Secondary | ICD-10-CM | POA: Diagnosis not present

## 2023-02-24 DIAGNOSIS — Z7984 Long term (current) use of oral hypoglycemic drugs: Secondary | ICD-10-CM | POA: Diagnosis not present

## 2023-02-24 DIAGNOSIS — Z96642 Presence of left artificial hip joint: Secondary | ICD-10-CM | POA: Diagnosis not present

## 2023-02-24 DIAGNOSIS — Z7982 Long term (current) use of aspirin: Secondary | ICD-10-CM | POA: Diagnosis not present

## 2023-02-24 DIAGNOSIS — Z96652 Presence of left artificial knee joint: Secondary | ICD-10-CM | POA: Diagnosis not present

## 2023-02-24 DIAGNOSIS — Z23 Encounter for immunization: Secondary | ICD-10-CM | POA: Diagnosis not present

## 2023-03-12 DIAGNOSIS — Z87891 Personal history of nicotine dependence: Secondary | ICD-10-CM | POA: Diagnosis not present

## 2023-03-12 DIAGNOSIS — Z791 Long term (current) use of non-steroidal anti-inflammatories (NSAID): Secondary | ICD-10-CM | POA: Diagnosis not present

## 2023-03-12 DIAGNOSIS — E782 Mixed hyperlipidemia: Secondary | ICD-10-CM | POA: Diagnosis not present

## 2023-03-12 DIAGNOSIS — J439 Emphysema, unspecified: Secondary | ICD-10-CM | POA: Diagnosis not present

## 2023-03-12 DIAGNOSIS — Z9981 Dependence on supplemental oxygen: Secondary | ICD-10-CM | POA: Diagnosis not present

## 2023-03-12 DIAGNOSIS — D631 Anemia in chronic kidney disease: Secondary | ICD-10-CM | POA: Diagnosis not present

## 2023-03-12 DIAGNOSIS — E1122 Type 2 diabetes mellitus with diabetic chronic kidney disease: Secondary | ICD-10-CM | POA: Diagnosis not present

## 2023-03-12 DIAGNOSIS — L409 Psoriasis, unspecified: Secondary | ICD-10-CM | POA: Diagnosis not present

## 2023-03-12 DIAGNOSIS — Z471 Aftercare following joint replacement surgery: Secondary | ICD-10-CM | POA: Diagnosis not present

## 2023-03-12 DIAGNOSIS — Z6832 Body mass index (BMI) 32.0-32.9, adult: Secondary | ICD-10-CM | POA: Diagnosis not present

## 2023-03-12 DIAGNOSIS — J9610 Chronic respiratory failure, unspecified whether with hypoxia or hypercapnia: Secondary | ICD-10-CM | POA: Diagnosis not present

## 2023-03-12 DIAGNOSIS — Z96643 Presence of artificial hip joint, bilateral: Secondary | ICD-10-CM | POA: Diagnosis not present

## 2023-03-12 DIAGNOSIS — I129 Hypertensive chronic kidney disease with stage 1 through stage 4 chronic kidney disease, or unspecified chronic kidney disease: Secondary | ICD-10-CM | POA: Diagnosis not present

## 2023-03-12 DIAGNOSIS — Z7984 Long term (current) use of oral hypoglycemic drugs: Secondary | ICD-10-CM | POA: Diagnosis not present

## 2023-03-12 DIAGNOSIS — Z96652 Presence of left artificial knee joint: Secondary | ICD-10-CM | POA: Diagnosis not present

## 2023-03-12 DIAGNOSIS — E669 Obesity, unspecified: Secondary | ICD-10-CM | POA: Diagnosis not present

## 2023-03-12 DIAGNOSIS — Z7982 Long term (current) use of aspirin: Secondary | ICD-10-CM | POA: Diagnosis not present

## 2023-03-12 DIAGNOSIS — N1832 Chronic kidney disease, stage 3b: Secondary | ICD-10-CM | POA: Diagnosis not present

## 2023-03-12 DIAGNOSIS — K219 Gastro-esophageal reflux disease without esophagitis: Secondary | ICD-10-CM | POA: Diagnosis not present

## 2023-03-15 DIAGNOSIS — Z7982 Long term (current) use of aspirin: Secondary | ICD-10-CM | POA: Diagnosis not present

## 2023-03-15 DIAGNOSIS — D631 Anemia in chronic kidney disease: Secondary | ICD-10-CM | POA: Diagnosis not present

## 2023-03-15 DIAGNOSIS — I129 Hypertensive chronic kidney disease with stage 1 through stage 4 chronic kidney disease, or unspecified chronic kidney disease: Secondary | ICD-10-CM | POA: Diagnosis not present

## 2023-03-15 DIAGNOSIS — Z87891 Personal history of nicotine dependence: Secondary | ICD-10-CM | POA: Diagnosis not present

## 2023-03-15 DIAGNOSIS — Z6832 Body mass index (BMI) 32.0-32.9, adult: Secondary | ICD-10-CM | POA: Diagnosis not present

## 2023-03-15 DIAGNOSIS — E1122 Type 2 diabetes mellitus with diabetic chronic kidney disease: Secondary | ICD-10-CM | POA: Diagnosis not present

## 2023-03-15 DIAGNOSIS — J439 Emphysema, unspecified: Secondary | ICD-10-CM | POA: Diagnosis not present

## 2023-03-15 DIAGNOSIS — Z471 Aftercare following joint replacement surgery: Secondary | ICD-10-CM | POA: Diagnosis not present

## 2023-03-15 DIAGNOSIS — L409 Psoriasis, unspecified: Secondary | ICD-10-CM | POA: Diagnosis not present

## 2023-03-15 DIAGNOSIS — Z7984 Long term (current) use of oral hypoglycemic drugs: Secondary | ICD-10-CM | POA: Diagnosis not present

## 2023-03-15 DIAGNOSIS — Z791 Long term (current) use of non-steroidal anti-inflammatories (NSAID): Secondary | ICD-10-CM | POA: Diagnosis not present

## 2023-03-15 DIAGNOSIS — J9610 Chronic respiratory failure, unspecified whether with hypoxia or hypercapnia: Secondary | ICD-10-CM | POA: Diagnosis not present

## 2023-03-15 DIAGNOSIS — E669 Obesity, unspecified: Secondary | ICD-10-CM | POA: Diagnosis not present

## 2023-03-15 DIAGNOSIS — E782 Mixed hyperlipidemia: Secondary | ICD-10-CM | POA: Diagnosis not present

## 2023-03-15 DIAGNOSIS — K219 Gastro-esophageal reflux disease without esophagitis: Secondary | ICD-10-CM | POA: Diagnosis not present

## 2023-03-15 DIAGNOSIS — N1832 Chronic kidney disease, stage 3b: Secondary | ICD-10-CM | POA: Diagnosis not present

## 2023-03-21 DIAGNOSIS — E119 Type 2 diabetes mellitus without complications: Secondary | ICD-10-CM | POA: Diagnosis not present

## 2023-03-22 DIAGNOSIS — Z791 Long term (current) use of non-steroidal anti-inflammatories (NSAID): Secondary | ICD-10-CM | POA: Diagnosis not present

## 2023-03-22 DIAGNOSIS — D631 Anemia in chronic kidney disease: Secondary | ICD-10-CM | POA: Diagnosis not present

## 2023-03-22 DIAGNOSIS — L409 Psoriasis, unspecified: Secondary | ICD-10-CM | POA: Diagnosis not present

## 2023-03-22 DIAGNOSIS — I129 Hypertensive chronic kidney disease with stage 1 through stage 4 chronic kidney disease, or unspecified chronic kidney disease: Secondary | ICD-10-CM | POA: Diagnosis not present

## 2023-03-22 DIAGNOSIS — K219 Gastro-esophageal reflux disease without esophagitis: Secondary | ICD-10-CM | POA: Diagnosis not present

## 2023-03-22 DIAGNOSIS — Z7982 Long term (current) use of aspirin: Secondary | ICD-10-CM | POA: Diagnosis not present

## 2023-03-22 DIAGNOSIS — Z7984 Long term (current) use of oral hypoglycemic drugs: Secondary | ICD-10-CM | POA: Diagnosis not present

## 2023-03-22 DIAGNOSIS — N1832 Chronic kidney disease, stage 3b: Secondary | ICD-10-CM | POA: Diagnosis not present

## 2023-03-22 DIAGNOSIS — Z87891 Personal history of nicotine dependence: Secondary | ICD-10-CM | POA: Diagnosis not present

## 2023-03-22 DIAGNOSIS — J439 Emphysema, unspecified: Secondary | ICD-10-CM | POA: Diagnosis not present

## 2023-03-22 DIAGNOSIS — Z471 Aftercare following joint replacement surgery: Secondary | ICD-10-CM | POA: Diagnosis not present

## 2023-03-22 DIAGNOSIS — E669 Obesity, unspecified: Secondary | ICD-10-CM | POA: Diagnosis not present

## 2023-03-22 DIAGNOSIS — Z6832 Body mass index (BMI) 32.0-32.9, adult: Secondary | ICD-10-CM | POA: Diagnosis not present

## 2023-03-22 DIAGNOSIS — E1122 Type 2 diabetes mellitus with diabetic chronic kidney disease: Secondary | ICD-10-CM | POA: Diagnosis not present

## 2023-03-22 DIAGNOSIS — J9610 Chronic respiratory failure, unspecified whether with hypoxia or hypercapnia: Secondary | ICD-10-CM | POA: Diagnosis not present

## 2023-03-22 DIAGNOSIS — E782 Mixed hyperlipidemia: Secondary | ICD-10-CM | POA: Diagnosis not present

## 2023-03-31 ENCOUNTER — Ambulatory Visit: Payer: Medicare Other | Admitting: Family Medicine

## 2023-04-02 DIAGNOSIS — E1122 Type 2 diabetes mellitus with diabetic chronic kidney disease: Secondary | ICD-10-CM | POA: Diagnosis not present

## 2023-04-02 DIAGNOSIS — K219 Gastro-esophageal reflux disease without esophagitis: Secondary | ICD-10-CM | POA: Diagnosis not present

## 2023-04-02 DIAGNOSIS — Z471 Aftercare following joint replacement surgery: Secondary | ICD-10-CM | POA: Diagnosis not present

## 2023-04-02 DIAGNOSIS — E782 Mixed hyperlipidemia: Secondary | ICD-10-CM | POA: Diagnosis not present

## 2023-04-02 DIAGNOSIS — Z791 Long term (current) use of non-steroidal anti-inflammatories (NSAID): Secondary | ICD-10-CM | POA: Diagnosis not present

## 2023-04-02 DIAGNOSIS — E669 Obesity, unspecified: Secondary | ICD-10-CM | POA: Diagnosis not present

## 2023-04-02 DIAGNOSIS — Z7984 Long term (current) use of oral hypoglycemic drugs: Secondary | ICD-10-CM | POA: Diagnosis not present

## 2023-04-02 DIAGNOSIS — L409 Psoriasis, unspecified: Secondary | ICD-10-CM | POA: Diagnosis not present

## 2023-04-02 DIAGNOSIS — Z7982 Long term (current) use of aspirin: Secondary | ICD-10-CM | POA: Diagnosis not present

## 2023-04-02 DIAGNOSIS — D631 Anemia in chronic kidney disease: Secondary | ICD-10-CM | POA: Diagnosis not present

## 2023-04-02 DIAGNOSIS — J9610 Chronic respiratory failure, unspecified whether with hypoxia or hypercapnia: Secondary | ICD-10-CM | POA: Diagnosis not present

## 2023-04-02 DIAGNOSIS — J439 Emphysema, unspecified: Secondary | ICD-10-CM | POA: Diagnosis not present

## 2023-04-02 DIAGNOSIS — Z87891 Personal history of nicotine dependence: Secondary | ICD-10-CM | POA: Diagnosis not present

## 2023-04-02 DIAGNOSIS — Z6832 Body mass index (BMI) 32.0-32.9, adult: Secondary | ICD-10-CM | POA: Diagnosis not present

## 2023-04-02 DIAGNOSIS — I129 Hypertensive chronic kidney disease with stage 1 through stage 4 chronic kidney disease, or unspecified chronic kidney disease: Secondary | ICD-10-CM | POA: Diagnosis not present

## 2023-04-02 DIAGNOSIS — N1832 Chronic kidney disease, stage 3b: Secondary | ICD-10-CM | POA: Diagnosis not present

## 2023-04-05 DIAGNOSIS — Z96641 Presence of right artificial hip joint: Secondary | ICD-10-CM | POA: Diagnosis not present

## 2023-04-06 DIAGNOSIS — K219 Gastro-esophageal reflux disease without esophagitis: Secondary | ICD-10-CM | POA: Diagnosis not present

## 2023-04-06 DIAGNOSIS — Z6832 Body mass index (BMI) 32.0-32.9, adult: Secondary | ICD-10-CM | POA: Diagnosis not present

## 2023-04-06 DIAGNOSIS — E782 Mixed hyperlipidemia: Secondary | ICD-10-CM | POA: Diagnosis not present

## 2023-04-06 DIAGNOSIS — J439 Emphysema, unspecified: Secondary | ICD-10-CM | POA: Diagnosis not present

## 2023-04-06 DIAGNOSIS — Z471 Aftercare following joint replacement surgery: Secondary | ICD-10-CM | POA: Diagnosis not present

## 2023-04-06 DIAGNOSIS — Z7984 Long term (current) use of oral hypoglycemic drugs: Secondary | ICD-10-CM | POA: Diagnosis not present

## 2023-04-06 DIAGNOSIS — E1122 Type 2 diabetes mellitus with diabetic chronic kidney disease: Secondary | ICD-10-CM | POA: Diagnosis not present

## 2023-04-06 DIAGNOSIS — I129 Hypertensive chronic kidney disease with stage 1 through stage 4 chronic kidney disease, or unspecified chronic kidney disease: Secondary | ICD-10-CM | POA: Diagnosis not present

## 2023-04-06 DIAGNOSIS — D631 Anemia in chronic kidney disease: Secondary | ICD-10-CM | POA: Diagnosis not present

## 2023-04-06 DIAGNOSIS — J9610 Chronic respiratory failure, unspecified whether with hypoxia or hypercapnia: Secondary | ICD-10-CM | POA: Diagnosis not present

## 2023-04-06 DIAGNOSIS — E669 Obesity, unspecified: Secondary | ICD-10-CM | POA: Diagnosis not present

## 2023-04-06 DIAGNOSIS — Z87891 Personal history of nicotine dependence: Secondary | ICD-10-CM | POA: Diagnosis not present

## 2023-04-06 DIAGNOSIS — L409 Psoriasis, unspecified: Secondary | ICD-10-CM | POA: Diagnosis not present

## 2023-04-06 DIAGNOSIS — N1832 Chronic kidney disease, stage 3b: Secondary | ICD-10-CM | POA: Diagnosis not present

## 2023-04-06 DIAGNOSIS — Z7982 Long term (current) use of aspirin: Secondary | ICD-10-CM | POA: Diagnosis not present

## 2023-04-06 DIAGNOSIS — Z791 Long term (current) use of non-steroidal anti-inflammatories (NSAID): Secondary | ICD-10-CM | POA: Diagnosis not present

## 2023-04-11 DIAGNOSIS — E669 Obesity, unspecified: Secondary | ICD-10-CM | POA: Diagnosis not present

## 2023-04-11 DIAGNOSIS — Z471 Aftercare following joint replacement surgery: Secondary | ICD-10-CM | POA: Diagnosis not present

## 2023-04-11 DIAGNOSIS — Z791 Long term (current) use of non-steroidal anti-inflammatories (NSAID): Secondary | ICD-10-CM | POA: Diagnosis not present

## 2023-04-11 DIAGNOSIS — Z6832 Body mass index (BMI) 32.0-32.9, adult: Secondary | ICD-10-CM | POA: Diagnosis not present

## 2023-04-11 DIAGNOSIS — Z87891 Personal history of nicotine dependence: Secondary | ICD-10-CM | POA: Diagnosis not present

## 2023-04-11 DIAGNOSIS — I129 Hypertensive chronic kidney disease with stage 1 through stage 4 chronic kidney disease, or unspecified chronic kidney disease: Secondary | ICD-10-CM | POA: Diagnosis not present

## 2023-04-11 DIAGNOSIS — Z96652 Presence of left artificial knee joint: Secondary | ICD-10-CM | POA: Diagnosis not present

## 2023-04-11 DIAGNOSIS — Z9981 Dependence on supplemental oxygen: Secondary | ICD-10-CM | POA: Diagnosis not present

## 2023-04-11 DIAGNOSIS — K219 Gastro-esophageal reflux disease without esophagitis: Secondary | ICD-10-CM | POA: Diagnosis not present

## 2023-04-11 DIAGNOSIS — N1832 Chronic kidney disease, stage 3b: Secondary | ICD-10-CM | POA: Diagnosis not present

## 2023-04-11 DIAGNOSIS — E1122 Type 2 diabetes mellitus with diabetic chronic kidney disease: Secondary | ICD-10-CM | POA: Diagnosis not present

## 2023-04-11 DIAGNOSIS — Z96643 Presence of artificial hip joint, bilateral: Secondary | ICD-10-CM | POA: Diagnosis not present

## 2023-04-11 DIAGNOSIS — J9610 Chronic respiratory failure, unspecified whether with hypoxia or hypercapnia: Secondary | ICD-10-CM | POA: Diagnosis not present

## 2023-04-11 DIAGNOSIS — E782 Mixed hyperlipidemia: Secondary | ICD-10-CM | POA: Diagnosis not present

## 2023-04-11 DIAGNOSIS — D631 Anemia in chronic kidney disease: Secondary | ICD-10-CM | POA: Diagnosis not present

## 2023-04-11 DIAGNOSIS — J439 Emphysema, unspecified: Secondary | ICD-10-CM | POA: Diagnosis not present

## 2023-04-11 DIAGNOSIS — L409 Psoriasis, unspecified: Secondary | ICD-10-CM | POA: Diagnosis not present

## 2023-04-11 DIAGNOSIS — Z7982 Long term (current) use of aspirin: Secondary | ICD-10-CM | POA: Diagnosis not present

## 2023-04-11 DIAGNOSIS — Z7984 Long term (current) use of oral hypoglycemic drugs: Secondary | ICD-10-CM | POA: Diagnosis not present

## 2023-04-12 DIAGNOSIS — N1832 Chronic kidney disease, stage 3b: Secondary | ICD-10-CM | POA: Diagnosis not present

## 2023-04-12 DIAGNOSIS — Z7982 Long term (current) use of aspirin: Secondary | ICD-10-CM | POA: Diagnosis not present

## 2023-04-12 DIAGNOSIS — D631 Anemia in chronic kidney disease: Secondary | ICD-10-CM | POA: Diagnosis not present

## 2023-04-12 DIAGNOSIS — Z471 Aftercare following joint replacement surgery: Secondary | ICD-10-CM | POA: Diagnosis not present

## 2023-04-12 DIAGNOSIS — Z87891 Personal history of nicotine dependence: Secondary | ICD-10-CM | POA: Diagnosis not present

## 2023-04-12 DIAGNOSIS — Z7984 Long term (current) use of oral hypoglycemic drugs: Secondary | ICD-10-CM | POA: Diagnosis not present

## 2023-04-12 DIAGNOSIS — Z791 Long term (current) use of non-steroidal anti-inflammatories (NSAID): Secondary | ICD-10-CM | POA: Diagnosis not present

## 2023-04-12 DIAGNOSIS — L409 Psoriasis, unspecified: Secondary | ICD-10-CM | POA: Diagnosis not present

## 2023-04-12 DIAGNOSIS — J439 Emphysema, unspecified: Secondary | ICD-10-CM | POA: Diagnosis not present

## 2023-04-12 DIAGNOSIS — E669 Obesity, unspecified: Secondary | ICD-10-CM | POA: Diagnosis not present

## 2023-04-12 DIAGNOSIS — I129 Hypertensive chronic kidney disease with stage 1 through stage 4 chronic kidney disease, or unspecified chronic kidney disease: Secondary | ICD-10-CM | POA: Diagnosis not present

## 2023-04-12 DIAGNOSIS — Z6832 Body mass index (BMI) 32.0-32.9, adult: Secondary | ICD-10-CM | POA: Diagnosis not present

## 2023-04-12 DIAGNOSIS — E782 Mixed hyperlipidemia: Secondary | ICD-10-CM | POA: Diagnosis not present

## 2023-04-12 DIAGNOSIS — E1122 Type 2 diabetes mellitus with diabetic chronic kidney disease: Secondary | ICD-10-CM | POA: Diagnosis not present

## 2023-04-12 DIAGNOSIS — J9610 Chronic respiratory failure, unspecified whether with hypoxia or hypercapnia: Secondary | ICD-10-CM | POA: Diagnosis not present

## 2023-04-12 DIAGNOSIS — K219 Gastro-esophageal reflux disease without esophagitis: Secondary | ICD-10-CM | POA: Diagnosis not present

## 2023-04-19 DIAGNOSIS — Z7982 Long term (current) use of aspirin: Secondary | ICD-10-CM | POA: Diagnosis not present

## 2023-04-19 DIAGNOSIS — I129 Hypertensive chronic kidney disease with stage 1 through stage 4 chronic kidney disease, or unspecified chronic kidney disease: Secondary | ICD-10-CM | POA: Diagnosis not present

## 2023-04-19 DIAGNOSIS — J9610 Chronic respiratory failure, unspecified whether with hypoxia or hypercapnia: Secondary | ICD-10-CM | POA: Diagnosis not present

## 2023-04-19 DIAGNOSIS — Z791 Long term (current) use of non-steroidal anti-inflammatories (NSAID): Secondary | ICD-10-CM | POA: Diagnosis not present

## 2023-04-19 DIAGNOSIS — Z471 Aftercare following joint replacement surgery: Secondary | ICD-10-CM | POA: Diagnosis not present

## 2023-04-19 DIAGNOSIS — J439 Emphysema, unspecified: Secondary | ICD-10-CM | POA: Diagnosis not present

## 2023-04-19 DIAGNOSIS — E669 Obesity, unspecified: Secondary | ICD-10-CM | POA: Diagnosis not present

## 2023-04-19 DIAGNOSIS — Z87891 Personal history of nicotine dependence: Secondary | ICD-10-CM | POA: Diagnosis not present

## 2023-04-19 DIAGNOSIS — Z7984 Long term (current) use of oral hypoglycemic drugs: Secondary | ICD-10-CM | POA: Diagnosis not present

## 2023-04-19 DIAGNOSIS — L409 Psoriasis, unspecified: Secondary | ICD-10-CM | POA: Diagnosis not present

## 2023-04-19 DIAGNOSIS — D631 Anemia in chronic kidney disease: Secondary | ICD-10-CM | POA: Diagnosis not present

## 2023-04-19 DIAGNOSIS — N1832 Chronic kidney disease, stage 3b: Secondary | ICD-10-CM | POA: Diagnosis not present

## 2023-04-19 DIAGNOSIS — K219 Gastro-esophageal reflux disease without esophagitis: Secondary | ICD-10-CM | POA: Diagnosis not present

## 2023-04-19 DIAGNOSIS — E782 Mixed hyperlipidemia: Secondary | ICD-10-CM | POA: Diagnosis not present

## 2023-04-19 DIAGNOSIS — E1122 Type 2 diabetes mellitus with diabetic chronic kidney disease: Secondary | ICD-10-CM | POA: Diagnosis not present

## 2023-04-19 DIAGNOSIS — Z6832 Body mass index (BMI) 32.0-32.9, adult: Secondary | ICD-10-CM | POA: Diagnosis not present

## 2023-04-21 DIAGNOSIS — E119 Type 2 diabetes mellitus without complications: Secondary | ICD-10-CM | POA: Diagnosis not present

## 2023-05-20 ENCOUNTER — Encounter: Payer: Self-pay | Admitting: Internal Medicine

## 2023-05-20 ENCOUNTER — Other Ambulatory Visit: Payer: Self-pay

## 2023-05-20 ENCOUNTER — Ambulatory Visit (INDEPENDENT_AMBULATORY_CARE_PROVIDER_SITE_OTHER): Payer: Medicare Other | Admitting: Internal Medicine

## 2023-05-20 VITALS — BP 124/68 | HR 69 | Temp 98.2°F | Resp 16 | Ht 65.0 in | Wt 202.9 lb

## 2023-05-20 DIAGNOSIS — K219 Gastro-esophageal reflux disease without esophagitis: Secondary | ICD-10-CM

## 2023-05-20 DIAGNOSIS — I10 Essential (primary) hypertension: Secondary | ICD-10-CM | POA: Diagnosis not present

## 2023-05-20 DIAGNOSIS — J4489 Other specified chronic obstructive pulmonary disease: Secondary | ICD-10-CM

## 2023-05-20 DIAGNOSIS — E1169 Type 2 diabetes mellitus with other specified complication: Secondary | ICD-10-CM | POA: Diagnosis not present

## 2023-05-20 DIAGNOSIS — E119 Type 2 diabetes mellitus without complications: Secondary | ICD-10-CM

## 2023-05-20 DIAGNOSIS — E782 Mixed hyperlipidemia: Secondary | ICD-10-CM | POA: Diagnosis not present

## 2023-05-20 LAB — POCT GLYCOSYLATED HEMOGLOBIN (HGB A1C): Hemoglobin A1C: 6.8 % — AB (ref 4.0–5.6)

## 2023-05-20 MED ORDER — TRELEGY ELLIPTA 100-62.5-25 MCG/ACT IN AEPB: 1.0000 | INHALATION_SPRAY | Freq: Every day | RESPIRATORY_TRACT | 11 refills | Status: AC

## 2023-05-20 MED ORDER — GLIMEPIRIDE 4 MG PO TABS: 4.0000 mg | ORAL_TABLET | Freq: Every day | ORAL | 0 refills | Status: DC

## 2023-05-20 MED ORDER — ALBUTEROL SULFATE HFA 108 (90 BASE) MCG/ACT IN AERS: 2.0000 | INHALATION_SPRAY | Freq: Four times a day (QID) | RESPIRATORY_TRACT | 0 refills | Status: AC | PRN
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
## 2023-05-20 NOTE — Assessment & Plan Note (Signed)
A1c controlled today at 6.8%, discussed switching from Glimepiride to something else due to risk of hypoglycemia, however patient cannot tolerate metformin and cannot afford other medications. No low sugars in the last year, will continue to monitor. Foot exam today.

## 2023-05-20 NOTE — Assessment & Plan Note (Signed)
Stable on PPI

## 2023-05-20 NOTE — Assessment & Plan Note (Signed)
Cannot afford Trelegy, samples given today. Refill Albuterol. Using 2L [place] with exertion and sleep.

## 2023-05-20 NOTE — Assessment & Plan Note (Signed)
Blood pressure stable here today, no changes made to medications and appropriate refills sent to pharmacy.

## 2023-05-20 NOTE — Assessment & Plan Note (Signed)
Plan to recheck labs at follow up, continue statin.

## 2023-05-21 ENCOUNTER — Encounter: Payer: Self-pay | Admitting: Internal Medicine

## 2023-05-21 LAB — MICROALBUMIN / CREATININE URINE RATIO
Creatinine, Urine: 273 mg/dL (ref 20–275)
Microalb Creat Ratio: 11 mg/g{creat} (ref <30)
Microalb, Ur: 3.1 mg/dL

## 2023-05-22 DIAGNOSIS — E119 Type 2 diabetes mellitus without complications: Secondary | ICD-10-CM | POA: Diagnosis not present

## 2023-06-19 DIAGNOSIS — E119 Type 2 diabetes mellitus without complications: Secondary | ICD-10-CM | POA: Diagnosis not present

## 2023-07-14 DIAGNOSIS — E119 Type 2 diabetes mellitus without complications: Secondary | ICD-10-CM | POA: Diagnosis not present

## 2023-07-20 DIAGNOSIS — E119 Type 2 diabetes mellitus without complications: Secondary | ICD-10-CM | POA: Diagnosis not present

## 2023-07-30 ENCOUNTER — Other Ambulatory Visit: Payer: Self-pay | Admitting: Emergency Medicine

## 2023-07-30 DIAGNOSIS — K219 Gastro-esophageal reflux disease without esophagitis: Secondary | ICD-10-CM

## 2023-07-30 DIAGNOSIS — E782 Mixed hyperlipidemia: Secondary | ICD-10-CM

## 2023-07-30 DIAGNOSIS — E119 Type 2 diabetes mellitus without complications: Secondary | ICD-10-CM

## 2023-07-30 DIAGNOSIS — I10 Essential (primary) hypertension: Secondary | ICD-10-CM

## 2023-08-04 ENCOUNTER — Other Ambulatory Visit: Payer: Self-pay | Admitting: Emergency Medicine

## 2023-08-04 ENCOUNTER — Other Ambulatory Visit: Payer: Self-pay | Admitting: Family Medicine

## 2023-08-04 DIAGNOSIS — E119 Type 2 diabetes mellitus without complications: Secondary | ICD-10-CM

## 2023-08-04 DIAGNOSIS — K219 Gastro-esophageal reflux disease without esophagitis: Secondary | ICD-10-CM

## 2023-08-04 DIAGNOSIS — E782 Mixed hyperlipidemia: Secondary | ICD-10-CM

## 2023-08-04 DIAGNOSIS — I10 Essential (primary) hypertension: Secondary | ICD-10-CM

## 2023-08-04 NOTE — Telephone Encounter (Signed)
 Copied from CRM [id number]. Topic: Clinical - Medication Refill >> Aug 04, 2023  2:04 PM Felizardo Hotter wrote: Most Recent Primary Care Visit:  Provider: Rockney Cid  Department: [facility]  Visit Type: OFFICE VISIT  Date: 05/20/2023  Medication: enalapril (VASOTEC) 20 MG tablet, carvedilol (COREG) 6.25 MG tablet, glimepiride (AMARYL) 4 MG tablet, rosuvastatin (CRESTOR) 10 MG tablet, hydrochlorothiazide (HYDRODIURIL) 25 MG tablet, pantoprazole (PROTONIX) 20 MG tablet  Has the patient contacted their pharmacy? Yes (Agent: If no, request that the patient contact the pharmacy for the refill. If patient does not wish to contact the pharmacy document the reason why and proceed with request.) (Agent: If yes, when and what did the pharmacy advise?)Pharmacy need approval from PCP.  Is this the correct pharmacy for this prescription? Yes If no, delete pharmacy and type the correct one.  This is the patient's preferred pharmacy:  [facility] - [place], [place] - [address] [place] [address] Phone: [phone] Fax: [phone]   Has the prescription been filled recently? Yes  Is the patient out of the medication? Yes  Has the patient been seen for an appointment in the last year OR does the patient have an upcoming appointment? Yes  Can we respond through MyChart? Yes  Agent: Please be advised that Rx refills may take up to 3 business days. We ask that you follow-up with your pharmacy.

## 2023-08-05 ENCOUNTER — Ambulatory Visit
Admission: RE | Admit: 2023-08-05 | Discharge: 2023-08-05 | Disposition: A | Discharge status: Home / Self Care | Source: Ambulatory Visit | Attending: Internal Medicine | Admitting: Internal Medicine

## 2023-08-05 ENCOUNTER — Ambulatory Visit (INDEPENDENT_AMBULATORY_CARE_PROVIDER_SITE_OTHER): Admitting: Internal Medicine

## 2023-08-05 ENCOUNTER — Ambulatory Visit: Attending: Cardiology | Admitting: Cardiology

## 2023-08-05 ENCOUNTER — Ambulatory Visit
Admission: RE | Admit: 2023-08-05 | Discharge: 2023-08-05 | Disposition: A | Discharge status: Home / Self Care | Attending: Internal Medicine | Admitting: Internal Medicine

## 2023-08-05 ENCOUNTER — Other Ambulatory Visit: Payer: Self-pay | Admitting: Internal Medicine

## 2023-08-05 VITALS — BP 136/82 | HR 83 | Resp 18 | Ht 65.0 in | Wt 208.3 lb

## 2023-08-05 DIAGNOSIS — M25562 Pain in left knee: Secondary | ICD-10-CM | POA: Insufficient documentation

## 2023-08-05 DIAGNOSIS — G8929 Other chronic pain: Secondary | ICD-10-CM

## 2023-08-05 DIAGNOSIS — Z96652 Presence of left artificial knee joint: Secondary | ICD-10-CM | POA: Diagnosis not present

## 2023-08-05 MED ORDER — ROSUVASTATIN CALCIUM 10 MG PO TABS: 10.0000 mg | ORAL_TABLET | Freq: Every day | ORAL | 0 refills | Status: DC

## 2023-08-05 MED ORDER — ENALAPRIL MALEATE 20 MG PO TABS: 20.0000 mg | ORAL_TABLET | Freq: Two times a day (BID) | ORAL | 0 refills | Status: DC

## 2023-08-05 MED ORDER — PANTOPRAZOLE SODIUM 20 MG PO TBEC: 20.0000 mg | DELAYED_RELEASE_TABLET | Freq: Every day | ORAL | 1 refills | Status: AC

## 2023-08-05 MED ORDER — HYDROCHLOROTHIAZIDE 25 MG PO TABS
25.0000 mg | ORAL_TABLET | Freq: Every day | ORAL | 0 refills | Status: DC
Start: 2023-08-05 — End: 2024-02-11

## 2023-08-05 MED ORDER — GLIMEPIRIDE 4 MG PO TABS: 4.0000 mg | ORAL_TABLET | Freq: Every day | ORAL | 0 refills | Status: DC

## 2023-08-05 MED ORDER — CARVEDILOL 6.25 MG PO TABS: 6.2500 mg | ORAL_TABLET | Freq: Two times a day (BID) | ORAL | 0 refills | Status: AC

## 2023-08-05 MED ORDER — CELECOXIB 50 MG PO CAPS: 50.0000 mg | ORAL_CAPSULE | Freq: Every day | ORAL | 0 refills | Status: DC | PRN

## 2023-08-05 NOTE — Telephone Encounter (Signed)
 Requested Prescriptions  Pending Prescriptions Disp Refills   enalapril (VASOTEC) 20 MG tablet 180 tablet 0    Sig: Take 1 tablet (20 mg total) by mouth 2 (two) times daily.     Cardiovascular:  ACE Inhibitors Failed - 08/05/2023 12:59 PM      Failed - Cr in normal range and within 180 days    Creat  Date Value Ref Range Status  09/24/2022 0.97 0.60 - 1.00 mg/dL Final   Creatinine, Urine  Date Value Ref Range Status  05/20/2023 273 20 - 275 mg/dL Final         Failed - K in normal range and within 180 days    Potassium  Date Value Ref Range Status  09/24/2022 5.2 3.5 - 5.3 mmol/L Final  03/30/2013 4.4 3.5 - 5.1 mmol/L Final         Failed - Valid encounter within last 6 months    Recent Outpatient Visits           Today Chronic pain of left knee   [facility] Rockney Cid, [place]              Passed - Patient is not pregnant      Passed - Last BP in normal range    BP Readings from Last 1 Encounters:  08/05/23 136/82          carvedilol (COREG) 6.25 MG tablet 180 tablet 0    Sig: Take 1 tablet (6.25 mg total) by mouth 2 (two) times daily.     Cardiovascular: Beta Blockers 3 Failed - 08/05/2023 12:59 PM      Failed - Valid encounter within last 6 months    Recent Outpatient Visits           Today Chronic pain of left knee   [facility] Rockney Cid, [place]              Passed - Cr in normal range and within 360 days    Creat  Date Value Ref Range Status  09/24/2022 0.97 0.60 - 1.00 mg/dL Final   Creatinine, Urine  Date Value Ref Range Status  05/20/2023 273 20 - 275 mg/dL Final         Passed - AST in normal range and within 360 days    AST  Date Value Ref Range Status  09/24/2022 14 10 - 35 U/L Final         Passed - ALT in normal range and within 360 days    ALT  Date Value Ref Range Status  09/24/2022 10 6 - 29 U/L Final         Passed - Last BP in normal range    BP Readings  from Last 1 Encounters:  08/05/23 136/82         Passed - Last Heart Rate in normal range    Pulse Readings from Last 1 Encounters:  08/05/23 83          glimepiride (AMARYL) 4 MG tablet 90 tablet 0    Sig: Take 1 tablet (4 mg total) by mouth daily with breakfast.     Endocrinology:  Diabetes - Sulfonylureas Failed - 08/05/2023 12:59 PM      Failed - Valid encounter within last 6 months    Recent Outpatient Visits           Today Chronic pain of left knee   [facility] Rockney Cid[place]              Passed - HBA1C is between 0 and 7.9 and within 180 days    Hemoglobin A1C  Date Value Ref Range Status  05/20/2023 6.8 (A) 4.0 - 5.6 % Final  01/10/2016 7.5  Final   Hgb A1c MFr Bld  Date Value Ref Range Status  09/24/2022 7.8 (H) <5.7 % of total Hgb Final    Comment:    For someone without known diabetes, a hemoglobin A1c value of 6.5% or greater indicates that they may have  diabetes and this should be confirmed with a follow-up  test. . For someone with known diabetes, a value <7% indicates  that their diabetes is well controlled and a value  greater than or equal to 7% indicates suboptimal  control. A1c targets should be individualized based on  duration of diabetes, age, comorbid conditions, and  other considerations. . Currently, no consensus exists regarding use of hemoglobin A1c for diagnosis of diabetes for children. .          Passed - Cr in normal range and within 360 days    Creat  Date Value Ref Range Status  09/24/2022 0.97 0.60 - 1.00 mg/dL Final   Creatinine, Urine  Date Value Ref Range Status  05/20/2023 273 20 - 275 mg/dL Final          rosuvastatin (CRESTOR) 10 MG tablet 90 tablet 0    Sig: Take 1 tablet (10 mg total) by mouth daily.     Cardiovascular:  Antilipid - Statins 2 Failed - 08/05/2023 12:59 PM      Failed - Valid encounter within last 12 months    Recent Outpatient Visits           Today  Chronic pain of left knee   [facility] Rockney Cid, [place]              Failed - Lipid Panel in normal range within the last 12 months    Cholesterol, Total  Date Value Ref Range Status  03/13/2015 205 (H) 100 - 199 mg/dL Final   Cholesterol  Date Value Ref Range Status  09/24/2022 202 (H) <200 mg/dL Final   LDL Cholesterol (Calc)  Date Value Ref Range Status  09/24/2022 113 (H) mg/dL (calc) Final    Comment:    Reference range: <100 . Desirable range <100 mg/dL for primary prevention;   <70 mg/dL for patients with CHD or diabetic patients  with > or = 2 CHD risk factors. Aaron Aas LDL-C is now calculated using the Martin-Hopkins  calculation, which is a validated novel method providing  better accuracy than the Friedewald equation in the  estimation of LDL-C.  Melinda Sprawls et al. Erroll Heard. 9629;528(41): 2061-2068  (http://education.[facility].com/faq/FAQ164)    HDL  Date Value Ref Range Status  09/24/2022 64 > OR = 50 mg/dL Final  32/44/0102 52 >72 mg/dL Final   Triglycerides  Date Value Ref Range Status  09/24/2022 137 <150 mg/dL Final         Passed - Cr in normal range and within 360 days    Creat  Date Value Ref Range Status  09/24/2022 0.97 0.60 - 1.00 mg/dL Final   Creatinine, Urine  Date Value Ref Range Status  05/20/2023 273 20 - 275 mg/dL Final         Passed - Patient is not pregnant       hydrochlorothiazide (HYDRODIURIL) 25 MG tablet 90 tablet  0    Sig: Take 1 tablet (25 mg total) by mouth daily.     Cardiovascular: Diuretics - Thiazide Failed - 08/05/2023 12:59 PM      Failed - Cr in normal range and within 180 days    Creat  Date Value Ref Range Status  09/24/2022 0.97 0.60 - 1.00 mg/dL Final   Creatinine, Urine  Date Value Ref Range Status  05/20/2023 273 20 - 275 mg/dL Final         Failed - K in normal range and within 180 days    Potassium  Date Value Ref Range Status  09/24/2022 5.2 3.5 - 5.3 mmol/L  Final  03/30/2013 4.4 3.5 - 5.1 mmol/L Final         Failed - Na in normal range and within 180 days    Sodium  Date Value Ref Range Status  09/24/2022 142 135 - 146 mmol/L Final  02/12/2021 139 134 - 144 mmol/L Final         Failed - Valid encounter within last 6 months    Recent Outpatient Visits           Today Chronic pain of left knee   [facility] Rockney Cid, [place]              Passed - Last BP in normal range    BP Readings from Last 1 Encounters:  08/05/23 136/82          pantoprazole (PROTONIX) 20 MG tablet 90 tablet 1    Sig: Take 1 tablet (20 mg total) by mouth daily.     Gastroenterology: Proton Pump Inhibitors Failed - 08/05/2023 12:59 PM      Failed - Valid encounter within last 12 months    Recent Outpatient Visits           Today Chronic pain of left knee   [facility] Rockney Cid, [place]

## 2023-08-05 NOTE — Progress Notes (Signed)
 Acute Office Visit  Subjective:     Patient ID: Julie Keith, female    DOB: Jan 29, 1946, 78 y.o.   MRN: [id number]  Chief Complaint  Patient presents with   Knee Pain    R knee    Knee Pain    Patient is in today for left knee pain. Surgical history includes left knee replacement 06/17/20, manipulation left knee 10/14/20, total left hip 10/15/21, total right hip 02/24/23.  Discussed the use of AI scribe software for clinical note transcription with the patient, who gave verbal consent to proceed.  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 The patient reports persistent soreness and instability in the left knee, which has been a constant issue since the knee replacement surgery. The patient feels unstable when getting up and relies on a walker for mobility. The patient reports that without the walker, she would likely fall due to the instability in the knee. The patient also reports weakness in the muscles of the leg, particularly around the joint. The patient is currently undergoing physical therapy and takes Tylenol for pain management. The patient has not found relief with meloxicam, an anti-inflammatory medication, in the past.     Review of Systems  Constitutional:  Negative for chills and fever.  Musculoskeletal:  Positive for joint pain.  Skin: Negative.         Objective:    BP 136/82   Pulse 83   Resp 18   Ht 5\' 5"  (1.651 m)   Wt 208 lb 4.8 oz (94.5 kg)   SpO2 97%   BMI 34.66 kg/m    Physical Exam Constitutional:      Appearance: Normal appearance.  HENT:     Head: Normocephalic and atraumatic.  Eyes:     Conjunctiva/sclera: Conjunctivae normal.  Cardiovascular:     Rate and Rhythm: Normal rate and regular rhythm.  Pulmonary:     Effort: Pulmonary effort is normal.     Breath sounds: Normal breath sounds.  Musculoskeletal:     Left  knee: No swelling or bony tenderness. Decreased range of motion. Tenderness present over the patellar tendon.  Skin:    General: Skin is warm and dry.  Neurological:     General: No focal deficit present.     Mental Status: She is alert. Mental status is at baseline.  Psychiatric:        Mood and Affect: Mood normal.        Behavior: Behavior normal.     No results found for any visits on 08/05/23.      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 Previous interventions ineffective. Potential arthritis or mechanical issue. Emphasized ambulatory device use and possible need for orthopedic intervention. - Order left knee x-ray  - Prescribe low dose Celebrex once daily with food as needed for pain, can continue Tylenol as well - Advise continued use of ambulatory devices to prevent falls. - Instruct to obtain a disc of the x-ray for the orthopedist. - Recommend follow-up with orthopedic surgeon Dr. Odis Luster.  Fall risk Increased fall risk due to knee instability and living alone. Emphasized ambulatory devices and life alert for safety. - Advise consistent use of ambulatory devices. - Encourage wearing life alert device at all times.    Return if symptoms worsen or fail  to improve.  Rockney Cid, DO

## 2023-08-05 NOTE — Progress Notes (Deleted)
 Cardiology Office Note:  .   Date:  08/05/2023  ID:  Julie Keith, DOB June 01, 1945, MRN [id number] PCP: Julie Hone, PA-C  [facility] Providers Cardiologist:  Julie Kirks, MD { Click to update primary MD,subspecialty MD or APP then REFRESH:1}   History of Present Illness: Julie Keith   Julie Keith is a 78 y.o. female with past medical history of paroxysmal SVT and PVCs, chronic respiratory failure with hypoxia on nocturnal oxygen, severe COPD with prior tobacco use, type 2 diabetes, hypertension, hyperlipidemia, COVID-19 infection, obesity, who presents today for follow-up of her paroxysmal SVT.   She wore Holter monitor in 2018 which showed sinus tach with 2 episodes of SVT felt to be likely represent atrial tachycardia.  She was started on metoprolol at that time.  Echocardiogram in 2022 demonstrated EF 60 to 65%, no RWMA, normal RV systolic function and size, mildly dilated left atrium, and no significant valvular abnormalities.  Following discontinuation of amlodipine she had resolution of previously noted lower extremity edema.  She was seen in our office 04/2021 and was without symptoms of angina or decompensation.  She was admitted 11/2021 with nausea vomiting diarrhea with associated abdominal symptoms.  CT of the head showed no acute cranial abnormality, CT abdomen pelvis showed no acute process with sigmoid colonic diverticulosis and aortic atherosclerosis.  Presentation was felt to be acute encephalopathy secondary to sepsis COPD exacerbation and COVID infection.   She was last seen in clinic 04/23/2022 was doing well from a cardiac perspective without any symptoms of angina or decompensation.  She returns to clinic today  ROS: 10 point review of systems has been reviewed and considered negative except ones were listed in the HPI  Studies Reviewed: .        2D echo 01/08/2021: 1. Left ventricular ejection fraction, by estimation, is 60 to 65%. The  left ventricle has normal  function. The left ventricle has no regional  wall motion abnormalities. Left ventricular diastolic parameters are  indeterminate.   2. Right ventricular systolic function is normal. The right ventricular  size is normal.   3. Left atrial size was mildly dilated.   4. The mitral valve is normal in structure. No evidence of mitral valve  regurgitation.   5. The aortic valve was not well visualized. Aortic valve regurgitation  is not visualized.   6. The inferior vena cava is dilated in size with <50% respiratory  variability, suggesting right atrial pressure of 15 mmHg.  __________   Outpatient cardiac monitor 07/2016: Underlying rhythm was sinus, heart rate ranged from 50-126 BPM, average of 67 BPM.   2 episodes of supraventricular tachycardia, likely atrial tachycardia. Fastest was 12 beats at 154 BPM. 08/12/2016, 4:10 PM. Julie Keith was also the longest.   No evidence of ventricular tachycardia, pauses, AV block, atrial fibrillation.   Supraventricular ectopy: 40,027 isolated PACs, 13.5% of the total number of beats. Occasional atrial couplets, rare atrial triplets.   Ventricular ectopy: Rare isolated PVCs.   No diary entries.   Risk Assessment/Calculations:     No BP recorded.  {Refresh Note OR Click here to enter BP  :1}***       Physical Exam:   VS:  There were no vitals taken for this visit.   Wt Readings from Last 3 Encounters:  05/20/23 202 lb 14.4 oz (92 kg)  02/03/23 205 lb (93 kg)  01/21/23 214 lb (97.1 kg)    GEN: Well nourished, well developed in no acute distress NECK: No  JVD; No carotid bruits CARDIAC: ***RRR, no murmurs, rubs, gallops RESPIRATORY:  Clear to auscultation without rales, wheezing or rhonchi  ABDOMEN: Soft, non-tender, non-distended EXTREMITIES:  No edema; No deformity   ASSESSMENT AND PLAN: .   Palpitations Primary hypertension Hyperlipidemia Chronic hypoxic respiratory failure on nocturnal oxygen Type 2 diabetes    {Are you ordering a CV  Procedure (e.g. stress test, cath, DCCV, TEE, etc)?   Press F2        :[id number]}  Dispo: ***  Signed, Larico Dimock, NP

## 2023-08-06 ENCOUNTER — Encounter: Payer: Self-pay | Admitting: Internal Medicine

## 2023-08-13 DIAGNOSIS — Z79899 Other long term (current) drug therapy: Secondary | ICD-10-CM | POA: Diagnosis not present

## 2023-08-13 DIAGNOSIS — L400 Psoriasis vulgaris: Secondary | ICD-10-CM | POA: Diagnosis not present

## 2023-08-13 DIAGNOSIS — Z111 Encounter for screening for respiratory tuberculosis: Secondary | ICD-10-CM | POA: Diagnosis not present

## 2023-08-16 DIAGNOSIS — Z79899 Other long term (current) drug therapy: Secondary | ICD-10-CM | POA: Diagnosis not present

## 2023-08-19 DIAGNOSIS — E119 Type 2 diabetes mellitus without complications: Secondary | ICD-10-CM | POA: Diagnosis not present

## 2023-08-25 DIAGNOSIS — M1712 Unilateral primary osteoarthritis, left knee: Secondary | ICD-10-CM | POA: Diagnosis not present

## 2023-08-27 ENCOUNTER — Other Ambulatory Visit: Payer: Self-pay | Admitting: Orthopedic Surgery

## 2023-08-27 DIAGNOSIS — Z96652 Presence of left artificial knee joint: Secondary | ICD-10-CM

## 2023-09-01 ENCOUNTER — Encounter
Admission: RE | Admit: 2023-09-01 | Discharge: 2023-09-01 | Disposition: A | Discharge status: Home / Self Care | Source: Ambulatory Visit | Attending: Orthopedic Surgery | Admitting: Orthopedic Surgery

## 2023-09-01 DIAGNOSIS — M25562 Pain in left knee: Secondary | ICD-10-CM | POA: Diagnosis not present

## 2023-09-01 DIAGNOSIS — Z96652 Presence of left artificial knee joint: Secondary | ICD-10-CM | POA: Insufficient documentation

## 2023-09-01 MED ORDER — TECHNETIUM TC 99M MEDRONATE IV KIT
20.0000 | PACK | Freq: Once | INTRAVENOUS | Status: AC | PRN
  Administered 2023-09-01: 22.33 via INTRAVENOUS

## 2023-09-10 DIAGNOSIS — M1712 Unilateral primary osteoarthritis, left knee: Secondary | ICD-10-CM | POA: Diagnosis not present

## 2023-09-16 DIAGNOSIS — Z96659 Presence of unspecified artificial knee joint: Secondary | ICD-10-CM | POA: Diagnosis not present

## 2023-09-17 DIAGNOSIS — Z96652 Presence of left artificial knee joint: Secondary | ICD-10-CM | POA: Diagnosis not present

## 2023-09-19 DIAGNOSIS — E119 Type 2 diabetes mellitus without complications: Secondary | ICD-10-CM | POA: Diagnosis not present

## 2023-10-06 ENCOUNTER — Other Ambulatory Visit: Payer: Self-pay | Admitting: Internal Medicine

## 2023-10-06 DIAGNOSIS — G8929 Other chronic pain: Secondary | ICD-10-CM

## 2023-10-07 NOTE — Telephone Encounter (Signed)
 Requested medications are due for refill today.  yes  Requested medications are on the active medications list.  yes  Last refill. 08/05/2023 #60 0 rf  Future visit scheduled.   yes  Notes to clinic.  Labs are expired.    Requested Prescriptions  Pending Prescriptions Disp Refills   celecoxib  (CELEBREX ) 50 MG capsule [Pharmacy Med Name: CELECOXIB  50MG  CAPSULES] 60 capsule 0    Sig: TAKE 1 CAPSULE(50 MG) BY MOUTH DAILY AS NEEDED FOR PAIN OR KNEE PAIN     Analgesics:  COX2 Inhibitors Failed - 10/07/2023  5:09 PM      Failed - Manual Review: Labs are only required if the patient has taken medication for more than 8 weeks.      Failed - HGB in normal range and within 360 days    Hemoglobin  Date Value Ref Range Status  09/24/2022 12.1 11.7 - 15.5 g/dL Final  54/12/8117 14.7 11.1 - 15.9 g/dL Final         Failed - Cr in normal range and within 360 days    Creat  Date Value Ref Range Status  09/24/2022 0.97 0.60 - 1.00 mg/dL Final   Creatinine, Urine  Date Value Ref Range Status  05/20/2023 273 20 - 275 mg/dL Final         Failed - HCT in normal range and within 360 days    HCT  Date Value Ref Range Status  09/24/2022 38.1 35.0 - 45.0 % Final   Hematocrit  Date Value Ref Range Status  03/13/2015 38.9 34.0 - 46.6 % Final         Failed - AST in normal range and within 360 days    AST  Date Value Ref Range Status  09/24/2022 14 10 - 35 U/L Final         Failed - ALT in normal range and within 360 days    ALT  Date Value Ref Range Status  09/24/2022 10 6 - 29 U/L Final         Failed - eGFR is 30 or above and within 360 days    GFR, Est African American  Date Value Ref Range Status  04/15/2020 55 (L) > OR = 60 mL/min/1.81m2 Final   GFR, Est Non African American  Date Value Ref Range Status  04/15/2020 47 (L) > OR = 60 mL/min/1.53m2 Final   GFR, Estimated  Date Value Ref Range Status  11/29/2021 42 (L) >60 mL/min Final    Comment:    (NOTE) Calculated  using the CKD-EPI Creatinine Equation (2021)    eGFR  Date Value Ref Range Status  09/24/2022 60 > OR = 60 mL/min/1.62m2 Final  02/12/2021 42 (L) >59 mL/min/1.73 Final         Failed - Valid encounter within last 12 months    Recent Outpatient Visits           2 months ago Chronic pain of left knee   [facility] Rockney Cid, [place] - Patient is not pregnant

## 2023-10-11 DIAGNOSIS — Z4889 Encounter for other specified surgical aftercare: Secondary | ICD-10-CM | POA: Diagnosis not present

## 2023-10-12 DIAGNOSIS — K080 Exfoliation of teeth due to systemic causes: Secondary | ICD-10-CM | POA: Diagnosis not present

## 2023-10-18 ENCOUNTER — Telehealth: Payer: Self-pay

## 2023-10-18 NOTE — Telephone Encounter (Signed)
 Will fax today

## 2023-10-18 NOTE — Telephone Encounter (Signed)
 Copied from CRM [id number]. Topic: Clinical - Medication Question >> Oct 18, 2023 10:14 AM Maisie BROCKS wrote: Reason for CRM: melissa from [facility] called and stated that they faxed over a request for the patients oxygen  order on 6/25. She asked for a callback w/ updates.  [phone], Fax: [phone]

## 2023-10-18 NOTE — Telephone Encounter (Signed)
 Copied from CRM [id number]. Topic: General - Other >> Oct 18, 2023  1:22 PM Rachelle R wrote: Reason for CRM: Patient is calling to check the status of her request for oxygen  refill. Says she is having surgery on 07/17 and needs to have this figured out asap. Asked to speak with [facility] directly.  Patient can be reached at [phone]

## 2023-10-19 DIAGNOSIS — E119 Type 2 diabetes mellitus without complications: Secondary | ICD-10-CM | POA: Diagnosis not present

## 2023-10-21 DIAGNOSIS — J449 Chronic obstructive pulmonary disease, unspecified: Secondary | ICD-10-CM | POA: Diagnosis not present

## 2023-10-28 DIAGNOSIS — K080 Exfoliation of teeth due to systemic causes: Secondary | ICD-10-CM | POA: Diagnosis not present

## 2023-10-29 ENCOUNTER — Encounter: Payer: Self-pay | Admitting: Internal Medicine

## 2023-10-29 ENCOUNTER — Other Ambulatory Visit: Payer: Self-pay

## 2023-10-29 ENCOUNTER — Ambulatory Visit (INDEPENDENT_AMBULATORY_CARE_PROVIDER_SITE_OTHER): Admitting: Internal Medicine

## 2023-10-29 VITALS — BP 122/84 | HR 69 | Temp 98.2°F | Resp 16 | Ht 65.0 in | Wt 209.4 lb

## 2023-10-29 DIAGNOSIS — Z01818 Encounter for other preprocedural examination: Secondary | ICD-10-CM | POA: Diagnosis not present

## 2023-10-29 LAB — POCT URINALYSIS DIPSTICK
Bilirubin, UA: NEGATIVE
Blood, UA: NEGATIVE
Glucose, UA: NEGATIVE
Ketones, UA: NEGATIVE
Nitrite, UA: NEGATIVE
Protein, UA: POSITIVE — AB
Spec Grav, UA: 1.02 (ref 1.010–1.025)
Urobilinogen, UA: 0.2 U/dL
pH, UA: 5 (ref 5.0–8.0)

## 2023-10-29 NOTE — Progress Notes (Signed)
 Acute Office Visit  Subjective:     Patient ID: Julie Keith, female    DOB: [birth year], 78 y.o.   MRN: [id number]  Chief Complaint  Patient presents with   surgical clearance    Left knee     Knee Pain   Patient is here for pre-op evaluation for upcoming knee surgery.   Type of Surgery: L TKA  Date of Surgery: 11/04/23 Doctor Performing Surgery: Dr. Leora Hx of surgical complications: None Hx of anesthesia complications: None No history of MI or cardiac stenting History of controlled COPD and type 2 diabetes Former smoker, no alcohol or illicit drug use Revised Cardiac Risk Index for major cardiac event (RCRI): TBD Pulmonary Risk Score (ARISCAT): TBD EKG:  Labwork: pending  Discussed the use of AI scribe software for clinical note transcription with the patient, who gave verbal consent to proceed.  History of Present Illness Julie Keith is a 78 year old female who presents for preoperative clearance for left total knee replacement surgery.  She is scheduled for left total knee replacement surgery on November 04, 2023. She recently had a tooth extraction and received prophylactic antibiotics. She is concerned about completing preoperative requirements, including lab work and an EKG.  Her medical history includes COPD, diabetes, and a heart murmur. She quit smoking many years ago and does not use tobacco, alcohol, or illicit drugs. There is no family history of anesthesia complications.  Her current medications are pantoprazole  and carvedilol . She is not on blood thinners. She has no issues with urination, burning, heart attacks, stents, or anesthesia problems.   Review of Systems  Constitutional:  Negative for chills and fever.  Musculoskeletal:  Positive for joint pain.  Skin: Negative.         Objective:    BP 122/84 (Cuff Size: Large)   Pulse 69   Temp 98.2 F (36.8 C) (Oral)   Resp 16   Ht 5' 5 (1.651 m)   Wt 209 lb 6.4 oz (95 kg)   SpO2 93%   BMI  34.85 kg/m    Physical Exam Constitutional:      Appearance: Normal appearance.  HENT:     Head: Normocephalic and atraumatic.  Eyes:     Conjunctiva/sclera: Conjunctivae normal.  Cardiovascular:     Rate and Rhythm: Normal rate and regular rhythm.  Pulmonary:     Effort: Pulmonary effort is normal.     Breath sounds: Normal breath sounds.  Musculoskeletal:     Left knee: No swelling or bony tenderness. Decreased range of motion. Tenderness present over the patellar tendon.  Skin:    General: Skin is warm and dry.  Neurological:     General: No focal deficit present.     Mental Status: She is alert. Mental status is at baseline.  Psychiatric:        Mood and Affect: Mood normal.        Behavior: Behavior normal.     No results found for any visits on 10/29/23.      Assessment & Plan:   Assessment & Plan Preoperative Evaluation for Left Total Knee Arthroplasty Scheduled for left total knee arthroplasty on November 04, 2023. Preoperative clearance required to assess surgical risk. COPD and diabetes. No smoking, alcohol use, or family history of anesthesia complications. Heart murmur present, no history of myocardial infarction or stents. - Order hemoglobin A1c, hemoglobin, kidney function, and albumin tests. - Perform urinalysis and EKG. - Review medications and advise to take  pantoprazole  and carvedilol  on the day of surgery, hold other medications. - Complete preoperative clearance form and send to surgeon if all tests are satisfactory.  - HgB A1c - CBC w/Diff/Platelet - Comprehensive Metabolic Panel (CMET) - POCT Urinalysis Dipstick - INR/PT - EKG 12-Lead    Return if symptoms worsen or fail to improve.  Sharyle Fischer, DO

## 2023-10-30 LAB — HEMOGLOBIN A1C
Hgb A1c MFr Bld: 8.2 % — ABNORMAL HIGH (ref <5.7)
Mean Plasma Glucose: 189 mg/dL
eAG (mmol/L): 10.4 mmol/L

## 2023-10-30 LAB — COMPREHENSIVE METABOLIC PANEL WITH GFR
AG Ratio: 1.2 (calc) (ref 1.0–2.5)
ALT: 11 U/L (ref 6–29)
AST: 15 U/L (ref 10–35)
Albumin: 4.1 g/dL (ref 3.6–5.1)
Alkaline phosphatase (APISO): 102 U/L (ref 37–153)
BUN/Creatinine Ratio: 12 (calc) (ref 6–22)
BUN: 14 mg/dL (ref 7–25)
CO2: 31 mmol/L (ref 20–32)
Calcium: 9.1 mg/dL (ref 8.6–10.4)
Chloride: 99 mmol/L (ref 98–110)
Creat: 1.21 mg/dL — ABNORMAL HIGH (ref 0.60–1.00)
Globulin: 3.3 g/dL (ref 1.9–3.7)
Glucose, Bld: 118 mg/dL — ABNORMAL HIGH (ref 65–99)
Potassium: 5.2 mmol/L (ref 3.5–5.3)
Sodium: 139 mmol/L (ref 135–146)
Total Bilirubin: 0.4 mg/dL (ref 0.2–1.2)
Total Protein: 7.4 g/dL (ref 6.1–8.1)
eGFR: 46 mL/min/1.73m2 — ABNORMAL LOW (ref >=60)

## 2023-10-30 LAB — CBC WITH DIFFERENTIAL/PLATELET
Absolute Lymphocytes: 2490 {cells}/uL (ref 850–3900)
Absolute Monocytes: 713 {cells}/uL (ref 200–950)
Basophils Absolute: 53 {cells}/uL (ref 0–200)
Basophils Relative: 0.6 %
Eosinophils Absolute: 88 {cells}/uL (ref 15–500)
Eosinophils Relative: 1 %
HCT: 42.1 % (ref 35.0–45.0)
Hemoglobin: 13.3 g/dL (ref 11.7–15.5)
MCH: 28.2 pg (ref 27.0–33.0)
MCHC: 31.6 g/dL — ABNORMAL LOW (ref 32.0–36.0)
MCV: 89.4 fL (ref 80.0–100.0)
MPV: 10 fL (ref 7.5–12.5)
Monocytes Relative: 8.1 %
Neutro Abs: 5456 {cells}/uL (ref 1500–7800)
Neutrophils Relative %: 62 %
Platelets: 309 Thousand/uL (ref 140–400)
RBC: 4.71 Million/uL (ref 3.80–5.10)
RDW: 13.2 % (ref 11.0–15.0)
Total Lymphocyte: 28.3 %
WBC: 8.8 Thousand/uL (ref 3.8–10.8)

## 2023-10-30 LAB — PROTIME-INR
INR: 1
Prothrombin Time: 10.5 s (ref 9.0–11.5)

## 2023-11-01 ENCOUNTER — Ambulatory Visit: Payer: Self-pay | Admitting: Internal Medicine

## 2023-11-01 ENCOUNTER — Telehealth: Payer: Self-pay

## 2023-11-01 NOTE — Telephone Encounter (Signed)
 Copied from CRM [id number]. Topic: General - Other >> Nov 01, 2023  3:47 PM Elle L wrote: Reason for CRM: The patient is requesting to speak to [facility] regarding her not being medically cleared for surgery. The patient's call back number is [phone].

## 2023-11-01 NOTE — Telephone Encounter (Signed)
 Spoke with pt and she will come into the office for her appt.

## 2023-11-02 NOTE — Telephone Encounter (Signed)
 Spoke to patient and stated her A1c was to elevated for surgical clearance. Please make her virtual appointmetn to discuss diabetes

## 2023-11-02 NOTE — Telephone Encounter (Signed)
 Had spoken to pt on yesterday and scheduled her an appointment for Thursday (Dr Bernardo first availability). She did not want a virtual she said she wanted to come in.

## 2023-11-04 ENCOUNTER — Other Ambulatory Visit: Payer: Self-pay

## 2023-11-04 ENCOUNTER — Ambulatory Visit (INDEPENDENT_AMBULATORY_CARE_PROVIDER_SITE_OTHER): Admitting: Internal Medicine

## 2023-11-04 ENCOUNTER — Encounter: Payer: Self-pay | Admitting: Internal Medicine

## 2023-11-04 VITALS — BP 128/82 | HR 82 | Temp 97.9°F | Resp 14 | Ht 65.0 in | Wt 210.5 lb

## 2023-11-04 DIAGNOSIS — Z7984 Long term (current) use of oral hypoglycemic drugs: Secondary | ICD-10-CM

## 2023-11-04 DIAGNOSIS — E119 Type 2 diabetes mellitus without complications: Secondary | ICD-10-CM | POA: Diagnosis not present

## 2023-11-04 MED ORDER — EMPAGLIFLOZIN 10 MG PO TABS: 10.0000 mg | ORAL_TABLET | Freq: Every day | ORAL | 0 refills | Status: DC

## 2023-11-04 MED ORDER — GLIMEPIRIDE 4 MG PO TABS: 4.0000 mg | ORAL_TABLET | Freq: Two times a day (BID) | ORAL | 1 refills | Status: DC
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
## 2023-11-05 ENCOUNTER — Telehealth: Payer: Self-pay

## 2023-11-05 DIAGNOSIS — E119 Type 2 diabetes mellitus without complications: Secondary | ICD-10-CM

## 2023-11-05 MED ORDER — EMPAGLIFLOZIN 10 MG PO TABS: 10.0000 mg | ORAL_TABLET | Freq: Every day | ORAL | 0 refills | Status: DC

## 2023-11-05 MED ORDER — GLIMEPIRIDE 4 MG PO TABS: 4.0000 mg | ORAL_TABLET | Freq: Two times a day (BID) | ORAL | 1 refills | Status: AC

## 2023-11-05 NOTE — Telephone Encounter (Signed)
 Copied from CRM [id number]. Topic: Clinical - Prescription Issue >> Nov 05, 2023  9:33 AM Travis F wrote: Reason for CRM: Patient is calling in because she wants to know if her prescriptions for glimepiride  (AMARYL ) 4 MG tablet [[id number]] & empagliflozin  (JARDIANCE ) 10 MG TABS tablet [[id number]] can be transferred to [facility] [address], [place] - [address], Phone: [phone] Fax: [phone]  Patient says she has been having issues with [facility]. Patient is requesting a call when it has been transferred. >> Nov 05, 2023 10:27 AM Delon DASEN wrote: Calling to follow up- wants preferred pharmacy changed to [facility]

## 2023-11-05 NOTE — Telephone Encounter (Signed)
 Resent pt notified

## 2023-11-15 ENCOUNTER — Ambulatory Visit: Admitting: Internal Medicine

## 2023-11-19 ENCOUNTER — Telehealth: Payer: Self-pay

## 2023-11-19 DIAGNOSIS — E119 Type 2 diabetes mellitus without complications: Secondary | ICD-10-CM | POA: Diagnosis not present

## 2023-11-19 NOTE — Telephone Encounter (Signed)
 Copied from CRM #[id number]. Topic: Clinical - Medication Question >> Nov 19, 2023  9:47 AM Tobias CROME wrote: Reason for CRM: Patient requesting all prescriptions to be sent to [facility] in [facility], patient no longer using the [facility].   Patient did not have the names of medications but requested all medications be switched to [facility].

## 2023-11-21 DIAGNOSIS — J449 Chronic obstructive pulmonary disease, unspecified: Secondary | ICD-10-CM | POA: Diagnosis not present

## 2023-11-24 NOTE — Telephone Encounter (Signed)
 Appointment on 8/29 left message for patient that we will send all scripts in at that time to new pharmacy

## 2023-12-08 ENCOUNTER — Other Ambulatory Visit: Payer: Self-pay | Admitting: Internal Medicine

## 2023-12-08 DIAGNOSIS — G8929 Other chronic pain: Secondary | ICD-10-CM

## 2023-12-09 NOTE — Telephone Encounter (Signed)
 Requested Prescriptions  Pending Prescriptions Disp Refills   celecoxib  (CELEBREX ) 50 MG capsule [Pharmacy Med Name: CELECOXIB  50MG  CAPSULES] 60 capsule 0    Sig: TAKE 1 CAPSULE(50 MG) BY MOUTH DAILY AS NEEDED FOR PAIN OR KNEE PAIN     Analgesics:  COX2 Inhibitors Failed - 12/09/2023  1:40 PM      Failed - Manual Review: Labs are only required if the patient has taken medication for more than 8 weeks.      Failed - Cr in normal range and within 360 days    Creat  Date Value Ref Range Status  10/29/2023 1.21 (H) 0.60 - 1.00 mg/dL Final   Creatinine, Urine  Date Value Ref Range Status  05/20/2023 273 20 - 275 mg/dL Final         Passed - HGB in normal range and within 360 days    Hemoglobin  Date Value Ref Range Status  10/29/2023 13.3 11.7 - 15.5 g/dL Final  88/76/7983 86.7 11.1 - 15.9 g/dL Final         Passed - HCT in normal range and within 360 days    HCT  Date Value Ref Range Status  10/29/2023 42.1 35.0 - 45.0 % Final   Hematocrit  Date Value Ref Range Status  03/13/2015 38.9 34.0 - 46.6 % Final         Passed - AST in normal range and within 360 days    AST  Date Value Ref Range Status  10/29/2023 15 10 - 35 U/L Final         Passed - ALT in normal range and within 360 days    ALT  Date Value Ref Range Status  10/29/2023 11 6 - 29 U/L Final         Passed - eGFR is 30 or above and within 360 days    GFR, Est African American  Date Value Ref Range Status  04/15/2020 55 (L) > OR = 60 mL/min/1.37m2 Final   GFR, Est Non African American  Date Value Ref Range Status  04/15/2020 47 (L) > OR = 60 mL/min/1.56m2 Final   GFR, Estimated  Date Value Ref Range Status  11/29/2021 42 (L) >60 mL/min Final    Comment:    (NOTE) Calculated using the CKD-EPI Creatinine Equation (2021)    eGFR  Date Value Ref Range Status  10/29/2023 46 (L) > OR = 60 mL/min/1.4m2 Final  02/12/2021 42 (L) >59 mL/min/1.73 Final         Passed - Patient is not pregnant       Passed - Valid encounter within last 12 months    Recent Outpatient Visits           1 month ago Type 2 diabetes mellitus without complication, without long-term current use of insulin  [facility])   [facility] Bernardo Fend, DO   1 month ago Pre-op evaluation   [facility] Bernardo Fend, DO   4 months ago Chronic pain of left knee   [facility] Bernardo Fend, DO       Future Appointments             In 1 week Bernardo Fend, DO [facility], [facility]

## 2023-12-17 ENCOUNTER — Other Ambulatory Visit: Payer: Self-pay

## 2023-12-17 ENCOUNTER — Ambulatory Visit: Admitting: Internal Medicine

## 2023-12-17 ENCOUNTER — Encounter: Payer: Self-pay | Admitting: Internal Medicine

## 2023-12-17 VITALS — BP 132/84 | HR 70 | Temp 97.9°F | Resp 16 | Ht 65.0 in | Wt 212.4 lb

## 2023-12-17 DIAGNOSIS — Z7984 Long term (current) use of oral hypoglycemic drugs: Secondary | ICD-10-CM | POA: Diagnosis not present

## 2023-12-17 DIAGNOSIS — E1165 Type 2 diabetes mellitus with hyperglycemia: Secondary | ICD-10-CM | POA: Diagnosis not present

## 2023-12-17 MED ORDER — RYBELSUS 3 MG PO TABS: 3.0000 mg | ORAL_TABLET | Freq: Every day | ORAL | Status: DC

## 2023-12-17 NOTE — Addendum Note (Signed)
 Addended by: BERNARDO FEND on: 12/17/2023 08:40 AM   Modules accepted: Level of Service
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
## 2023-12-20 DIAGNOSIS — E119 Type 2 diabetes mellitus without complications: Secondary | ICD-10-CM | POA: Diagnosis not present

## 2023-12-22 DIAGNOSIS — J449 Chronic obstructive pulmonary disease, unspecified: Secondary | ICD-10-CM | POA: Diagnosis not present

## 2023-12-24 ENCOUNTER — Telehealth: Payer: Self-pay

## 2023-12-24 NOTE — Progress Notes (Signed)
 Complex Care Management Note Care Guide Note  12/24/2023 Name: Julie Keith MRN: [id number] DOB: 04/18/1946   Complex Care Management Outreach Attempts: An unsuccessful telephone outreach was attempted today to offer the patient information about available complex care management services.  Follow Up Plan:  Additional outreach attempts will be made to offer the patient complex care management information and services.   Encounter Outcome:  No Answer  Dreama [facility]wood [facility]  [facility], [facility] VBCI Assistant Direct Dial: [phone]  Fax: [phone]

## 2023-12-27 NOTE — Progress Notes (Signed)
 Complex Care Management Note  Care Guide Note 12/27/2023 Name: Julie Keith MRN: [id number] DOB: 09/12/1945  Julie Keith is a 78 y.o. year old female who sees Bernardo Fend, DO for primary care. I reached out to Julie Keith by phone today to offer complex care management services.  [name] was given information about Complex Care Management services today including:   The Complex Care Management services include support from the care team which includes your Nurse Care Manager, Clinical Social Worker, or Pharmacist.  The Complex Care Management team is here to help remove barriers to the health concerns and goals most important to you. Complex Care Management services are voluntary, and the patient may decline or stop services at any time by request to their care team member.   Complex Care Management Consent Status: Patient agreed to services and verbal consent obtained.   Follow up plan:  Telephone appointment with complex care management team member scheduled for:  01/04/24 at 11:00 a.m.   Encounter Outcome:  Patient Scheduled  Dreama [facility]wood [facility]  [facility], [facility] VBCI Assistant Direct Dial: [phone]  Fax: [phone]

## 2024-01-04 ENCOUNTER — Other Ambulatory Visit (HOSPITAL_COMMUNITY): Payer: Self-pay

## 2024-01-04 ENCOUNTER — Telehealth: Payer: Self-pay

## 2024-01-04 ENCOUNTER — Other Ambulatory Visit (INDEPENDENT_AMBULATORY_CARE_PROVIDER_SITE_OTHER)

## 2024-01-04 DIAGNOSIS — Z7984 Long term (current) use of oral hypoglycemic drugs: Secondary | ICD-10-CM

## 2024-01-04 DIAGNOSIS — E1165 Type 2 diabetes mellitus with hyperglycemia: Secondary | ICD-10-CM

## 2024-01-04 DIAGNOSIS — E119 Type 2 diabetes mellitus without complications: Secondary | ICD-10-CM

## 2024-01-04 NOTE — Progress Notes (Unsigned)
 S:     Chief Complaint  Patient presents with   Diabetes    Reason for visit: ?  Julie Keith is a 78 y.o. female with a history of diabetes (type 2), who presents today for an initial diabetes pharmacotherapy visit.? Pertinent PMH also includes HTN, GERD, osteoarthritis, CKD stage 3b, HLD, obesity, hx of total knee and hip (left) replacement.  Care Team: Primary Care Provider: Bernardo Fend, DO  At last visit with PCP on 12/17/23, A1c was elevated at 8.2%. She is pre-op and must have A1c <7.5% for clearance. She reported some difficulty affording Jardiance  at that time. Reported fasting BG 100-110 mg/dL. Patient was started on Rybelsus  3 mg daily at that time.   Current diabetes medications include: Rybelsus  3 mg daily, Jardiance  10 mg daily, glimepiride  4 mg BID Previous diabetes medications include: metformin (swelling), Januvia , pioglitazone  Current hypertension medications include: carvedilol  6.25 mg BID, enalapril  20 mg BID, hydrochlorothiazide  25 mg daily Current hyperlipidemia medications include: rosuvastatin  10 mg daily  Patient reports adherence to taking all medications as prescribed.   Have you been experiencing any side effects to the medications prescribed? no Do you have any problems obtaining medications due to transportation or finances? yes Insurance coverage: BCBS Medicare  Patient denies hypoglycemic events.  Reported home fasting blood sugars: 100-110 mg/dL  Patient reports nocturia (nighttime urination) about once per night.  Patient denies neuropathy (nerve pain). Patient denies visual changes. Patient reports self foot exams.   Patient reported dietary habits: Eats 3 meals/day Breakfast: grits, oatmeal Dinner: sandwich or salad Supper: sandwich, vegetable soup Snacks: cookie Drinks: water , unsweet tea  Patient-reported exercise habits: limited d/t knee DM Prevention:  Statin: Taking; moderate intensity.?  History of chronic kidney  disease? yes History of albuminuria? no, last UACR on 05/20/23 = 11 mg/g Last eye exam: reports in 2025 Lab Results  Component Value Date   HMDIABEYEEXA No Retinopathy 02/16/2022   Tobacco Use:  Tobacco Use: Medium Risk (12/17/2023)   Patient History    Smoking Tobacco Use: Former    Smokeless Tobacco Use: Never    Passive Exposure: Not on file   O:  Vitals:  Wt Readings from Last 3 Encounters:  12/17/23 212 lb 6.4 oz (96.3 kg)  11/04/23 210 lb 8 oz (95.5 kg)  10/29/23 209 lb 6.4 oz (95 kg)   BP Readings from Last 3 Encounters:  12/17/23 132/84  11/04/23 128/82  10/29/23 122/84   Pulse Readings from Last 3 Encounters:  12/17/23 70  11/04/23 82  10/29/23 69     Labs:?  Lab Results  Component Value Date   HGBA1C 8.2 (H) 10/29/2023   HGBA1C 6.8 (A) 05/20/2023   HGBA1C 7.8 (H) 09/24/2022   GLUCOSE 118 (H) 10/29/2023   MICRALBCREAT 11 05/20/2023   MICRALBCREAT 6 02/23/2022   MICRALBCREAT 7 01/26/2018   CREATININE 1.21 (H) 10/29/2023   CREATININE 0.97 09/24/2022   CREATININE 1.0 09/22/2022    Lab Results  Component Value Date   CHOL 202 (H) 09/24/2022   LDLCALC 113 (H) 09/24/2022   LDLCALC 106 (H) 02/23/2022   LDLCALC 75 11/08/2020   HDL 64 09/24/2022   TRIG 137 09/24/2022   TRIG 89 02/23/2022   TRIG 107 11/08/2020   ALT 11 10/29/2023   ALT 10 09/24/2022   AST 15 10/29/2023   AST 14 09/24/2022      Chemistry      Component Value Date/Time   NA 139 10/29/2023 1236   NA 139 02/12/2021  0801   K 5.2 10/29/2023 1236   K 4.4 03/30/2013 1022   CL 99 10/29/2023 1236   CO2 31 10/29/2023 1236   BUN 14 10/29/2023 1236   BUN 27 02/12/2021 0801   CREATININE 1.21 (H) 10/29/2023 1236   GLU 168 01/10/2016 0000      Component Value Date/Time   CALCIUM  9.1 10/29/2023 1236   ALKPHOS 97 11/29/2021 2205   AST 15 10/29/2023 1236   ALT 11 10/29/2023 1236   BILITOT 0.4 10/29/2023 1236       The 10-year ASCVD risk score (Arnett DK, et al., 2019) is: 54.5%  Lab  Results  Component Value Date   MICRALBCREAT 11 05/20/2023   MICRALBCREAT 6 02/23/2022   MICRALBCREAT 7 01/26/2018   MICRALBCREAT 12 08/06/2017   MICRALBCREAT 17 07/29/2016    A/P: Diabetes currently uncontrolled with a most recent A1c of 8.2% on 10/29/23, which is up from 6.8% on 05/20/23. Patient is able to verbalize appropriate hypoglycemia management plan. Medication adherence appears appropriate and reported fasting BG readings are at goal. Will likely be able to discontinue glimepiride  at follow up given declining kidney function and medication tolerance overtime. Patient has been doing well on Rybelsus . Will enroll in PAP via [employer] with increased dose. PAP program is a bit more intensive for Jardiance  vs Farxiga . Patient agreeable to switching SGLT2i therapy at this time.  -Increased dose of GLP-1 Rybelsus  (semaglutide ) to 7 mg when finished with current 30 day sample. Will enroll in PAP -Switched from SGLT2-I Jardiance  (empagliflozin ) 10 mg daily to Farxiga  10 mg daily. Will enroll in PAP -Continued glimepiride  4 mg BID -Patient educated on purpose, proper use, and potential adverse effects of Farxiga .  -Extensively discussed pathophysiology of diabetes, recommended lifestyle interventions, dietary effects on blood sugar control.  -Counseled on s/sx of and management of hypoglycemia.  -Next A1c anticipated 01/2024.   Patient verbalized understanding of treatment plan. Total time patient counseling 30 minutes.  Follow-up:  Pharmacist on 02/01/24 PCP clinic visit on 02/01/24  Peyton CHARLENA Ferries, PharmD Clinical Pharmacist [facility] [phone]

## 2024-01-04 NOTE — Telephone Encounter (Signed)
 Filled and faxed provider portion of AZ&ME (farxiga ) and Novo Nordisk(Rybelsus ) to provider office to sign and date ,pt portion of Novo Nordisk was done online today and AZ&ME (Farxiga ) email to [facility] for pt to sing at the office.

## 2024-01-05 MED ORDER — DAPAGLIFLOZIN PROPANEDIOL 10 MG PO TABS: 10.0000 mg | ORAL_TABLET | Freq: Every day | ORAL | Status: DC

## 2024-01-05 MED ORDER — RYBELSUS 7 MG PO TABS: 7.0000 mg | ORAL_TABLET | Freq: Every day | ORAL | Status: AC

## 2024-01-05 NOTE — Telephone Encounter (Signed)
 Received provider portion of AZ&ME(Farxiga ) and Novo Nordisk (Rybelsus )today, [employer] provider portion today and waiting on AZ&ME pt portion.

## 2024-01-06 ENCOUNTER — Telehealth: Payer: Self-pay

## 2024-01-06 NOTE — Telephone Encounter (Signed)
 Copied from CRM [id number]. Topic: General - Other >> Jan 06, 2024  8:14 AM Burnard DEL wrote: Reason for CRM: Jerica from [employer] called to inform that patient was approved for rebelsus medication and it should be delivered in 10 to 14 business days to the office.  Patient ID#: [id number]

## 2024-01-11 NOTE — Telephone Encounter (Signed)
 Spoke with Mrs. Julie Keith was following up AZ&ME(farxiga ) she said she does not want to continue with the application she does not understand why we are messing up with her meds, I told her I will have RPH Allyson give her a call, she doesn't want to talk with anyone, said they can call but I wont answer she also said something about Jardiance  I told her I will mail out an application she agree but not to sure she will mail back she said it was to expensive to please stop messing up with her medications. I ask if she leave far from provider office she said it is about 25 min. but has a hard time getting around due to her knee(was going to ask to come and sign at the office)Spoke with [facility] Peyton will talked with pt ask to email AZ&ME pt portion.

## 2024-01-11 NOTE — Telephone Encounter (Signed)
 Received approval letter form [employer] today on rybelsus , pt has nee approved until 04/19/24, left a HIPAA VM at pt number.

## 2024-01-18 NOTE — Telephone Encounter (Signed)
 Call pt to follow up on pap AZ&ME (farxiga )pt did not answer, RPH Jon said she will follow up with pt.

## 2024-01-19 DIAGNOSIS — E119 Type 2 diabetes mellitus without complications: Secondary | ICD-10-CM | POA: Diagnosis not present

## 2024-01-27 ENCOUNTER — Ambulatory Visit: Payer: Medicare Other

## 2024-01-27 VITALS — Ht 65.0 in | Wt 212.0 lb

## 2024-01-27 DIAGNOSIS — Z0000 Encounter for general adult medical examination without abnormal findings: Secondary | ICD-10-CM

## 2024-01-27 NOTE — Patient Instructions (Signed)
 Ms. Rominger,  Thank you for taking the time for your Medicare Wellness Visit. I appreciate your continued commitment to your health goals. Please review the care plan we discussed, and feel free to reach out if I can assist you further.  Medicare recommends these wellness visits once per year to help you and your care team stay ahead of potential health issues. These visits are designed to focus on prevention, allowing your provider to concentrate on managing your acute and chronic conditions during your regular appointments.  Please note that Annual Wellness Visits do not include a physical exam. Some assessments may be limited, especially if the visit was conducted virtually. If needed, we may recommend a separate in-person follow-up with your provider.  Ongoing Care Seeing your primary care provider every 3 to 6 months helps us  monitor your health and provide consistent, personalized care.   Referrals If a referral was made during today's visit and you haven't received any updates within two weeks, please contact the referred provider directly to check on the status.  Recommended Screenings:  Health Maintenance  Topic Date Due   DEXA scan (bone density measurement)  Never done   COVID-19 Vaccine (3 - Pfizer risk series) 05/08/2020   Flu Shot  11/19/2023   Medicare Annual Wellness Visit  01/21/2024   Zoster (Shingles) Vaccine (1 of 2) 01/29/2024*   Hemoglobin A1C  04/30/2024   Yearly kidney health urinalysis for diabetes  05/19/2024   Complete foot exam   05/19/2024   Eye exam for diabetics  07/13/2024   Yearly kidney function blood test for diabetes  10/28/2024   Pneumococcal Vaccine for age over 37  Completed   Hepatitis C Screening  Completed   Meningitis B Vaccine  Aged Out   DTaP/Tdap/Td vaccine  Discontinued   Breast Cancer Screening  Discontinued  *Topic was postponed. The date shown is not the original due date.       01/27/2024    2:14 PM  Advanced Directives  Does  Patient Have a Medical Advance Directive? Yes  Type of Estate agent of [place];Living will  Does patient want to make changes to medical advance directive? No - Patient declined  Copy of Healthcare Power of Attorney in Chart? Yes - validated most recent copy scanned in chart (See row information)   Advance Care Planning is important because it: Ensures you receive medical care that aligns with your values, goals, and preferences. Provides guidance to your family and loved ones, reducing the emotional burden of decision-making during critical moments.  Vision: Annual vision screenings are recommended for early detection of glaucoma, cataracts, and diabetic retinopathy. These exams can also reveal signs of chronic conditions such as diabetes and high blood pressure.  Dental: Annual dental screenings help detect early signs of oral cancer, gum disease, and other conditions linked to overall health, including heart disease and diabetes.  Please see the attached documents for additional preventive care recommendations.
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
## 2024-01-28 ENCOUNTER — Telehealth: Payer: Self-pay | Admitting: Internal Medicine

## 2024-01-31 NOTE — Telephone Encounter (Signed)
 Left message for patient

## 2024-02-01 ENCOUNTER — Ambulatory Visit

## 2024-02-01 ENCOUNTER — Ambulatory Visit: Admitting: Internal Medicine

## 2024-02-10 ENCOUNTER — Other Ambulatory Visit: Payer: Self-pay | Admitting: Physician Assistant

## 2024-02-10 DIAGNOSIS — E782 Mixed hyperlipidemia: Secondary | ICD-10-CM

## 2024-02-11 ENCOUNTER — Ambulatory Visit (INDEPENDENT_AMBULATORY_CARE_PROVIDER_SITE_OTHER)

## 2024-02-11 ENCOUNTER — Other Ambulatory Visit

## 2024-02-11 ENCOUNTER — Telehealth: Payer: Self-pay

## 2024-02-11 VITALS — BP 130/72 | HR 49 | Ht 65.0 in | Wt 212.6 lb

## 2024-02-11 DIAGNOSIS — G8929 Other chronic pain: Secondary | ICD-10-CM

## 2024-02-11 DIAGNOSIS — Z96642 Presence of left artificial hip joint: Secondary | ICD-10-CM | POA: Diagnosis not present

## 2024-02-11 DIAGNOSIS — J439 Emphysema, unspecified: Secondary | ICD-10-CM

## 2024-02-11 DIAGNOSIS — I10 Essential (primary) hypertension: Secondary | ICD-10-CM

## 2024-02-11 DIAGNOSIS — Z23 Encounter for immunization: Secondary | ICD-10-CM | POA: Diagnosis not present

## 2024-02-11 DIAGNOSIS — M25562 Pain in left knee: Secondary | ICD-10-CM

## 2024-02-11 DIAGNOSIS — E669 Obesity, unspecified: Secondary | ICD-10-CM

## 2024-02-11 DIAGNOSIS — E119 Type 2 diabetes mellitus without complications: Secondary | ICD-10-CM

## 2024-02-11 DIAGNOSIS — Z96652 Presence of left artificial knee joint: Secondary | ICD-10-CM

## 2024-02-11 DIAGNOSIS — J9611 Chronic respiratory failure with hypoxia: Secondary | ICD-10-CM

## 2024-02-11 NOTE — Telephone Encounter (Unsigned)
 Copied from CRM [id number]. Topic: Clinical - Medical Advice >> Feb 11, 2024  2:57 PM Tiffini S wrote: Reason for CRM: Patient is asking for a call back from the pcp or nurse- patient have questions about lab work- A1C and her surgery  Please call the patient back at [phone]
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
## 2024-02-12 NOTE — Telephone Encounter (Signed)
 Requested Prescriptions  Pending Prescriptions Disp Refills   rosuvastatin  (CRESTOR ) 10 MG tablet [Pharmacy Med Name: ROSUVASTATIN  10MG  TAB] 90 tablet 0    Sig: Take 1 tablet by mouth once daily     Cardiovascular:  Antilipid - Statins 2 Failed - 02/12/2024  7:13 AM      Failed - Cr in normal range and within 360 days    Creat  Date Value Ref Range Status  10/29/2023 1.21 (H) 0.60 - 1.00 mg/dL Final   Creatinine, Urine  Date Value Ref Range Status  05/20/2023 273 20 - 275 mg/dL Final         Failed - Lipid Panel in normal range within the last 12 months    Cholesterol, Total  Date Value Ref Range Status  03/13/2015 205 (H) 100 - 199 mg/dL Final   Cholesterol  Date Value Ref Range Status  09/24/2022 202 (H) <200 mg/dL Final   LDL Cholesterol (Calc)  Date Value Ref Range Status  09/24/2022 113 (H) mg/dL (calc) Final    Comment:    Reference range: <100 . Desirable range <100 mg/dL for primary prevention;   <70 mg/dL for patients with CHD or diabetic patients  with > or = 2 CHD risk factors. SABRA LDL-C is now calculated using the Martin-Hopkins  calculation, which is a validated novel method providing  better accuracy than the Friedewald equation in the  estimation of LDL-C.  Gladis APPLETHWAITE et al. SANDREA. 7986;689(80): 2061-2068  (http://education.[employer].com/faq/FAQ164)    HDL  Date Value Ref Range Status  09/24/2022 64 > OR = 50 mg/dL Final  88/76/7983 52 >60 mg/dL Final   Triglycerides  Date Value Ref Range Status  09/24/2022 137 <150 mg/dL Final         Passed - Patient is not pregnant      Passed - Valid encounter within last 12 months    Recent Outpatient Visits           Yesterday Flu vaccine need   [facility], Melvern A, MD   1 month ago Type 2 diabetes mellitus with hyperglycemia, without long-term current use of insulin  [facility])   [facility] Bernardo Fend, DO   3 months ago Type  2 diabetes mellitus without complication, without long-term current use of insulin  [facility])   [facility] Bernardo Fend, DO   3 months ago Pre-op evaluation   [facility] Bernardo Fend, DO   6 months ago Chronic pain of left knee   [facility] Bernardo Fend, [place]

## 2024-02-14 DIAGNOSIS — Z96652 Presence of left artificial knee joint: Secondary | ICD-10-CM | POA: Diagnosis not present

## 2024-02-18 ENCOUNTER — Telehealth: Payer: Self-pay

## 2024-02-18 NOTE — Telephone Encounter (Signed)
 Copied from CRM [id number]. Topic: General - Other >> Feb 18, 2024 10:22 AM Julie Keith wrote: Reason for CRM: Patient called I states she has an appt with knee surgery 1/19

## 2024-02-19 DIAGNOSIS — E119 Type 2 diabetes mellitus without complications: Secondary | ICD-10-CM | POA: Diagnosis not present

## 2024-02-23 ENCOUNTER — Encounter: Admitting: Nurse Practitioner

## 2024-03-06 ENCOUNTER — Other Ambulatory Visit

## 2024-03-06 DIAGNOSIS — J439 Emphysema, unspecified: Secondary | ICD-10-CM

## 2024-03-06 DIAGNOSIS — Z96642 Presence of left artificial hip joint: Secondary | ICD-10-CM

## 2024-03-06 DIAGNOSIS — E119 Type 2 diabetes mellitus without complications: Secondary | ICD-10-CM

## 2024-03-06 DIAGNOSIS — E669 Obesity, unspecified: Secondary | ICD-10-CM

## 2024-03-06 DIAGNOSIS — Z23 Encounter for immunization: Secondary | ICD-10-CM

## 2024-03-06 DIAGNOSIS — I10 Essential (primary) hypertension: Secondary | ICD-10-CM

## 2024-03-06 DIAGNOSIS — J9611 Chronic respiratory failure with hypoxia: Secondary | ICD-10-CM

## 2024-03-10 ENCOUNTER — Telehealth: Payer: Self-pay

## 2024-03-10 NOTE — Telephone Encounter (Signed)
 Copied from CRM [id number]. Topic: General - Other >> Mar 10, 2024 12:41 PM Fonda T wrote: Reason for CRM: Received call from [facility] with [facility], calling to check status of surgical clearance that was faxed to office on 02/18/24, and faxed another request on 02/23/24. States have not received a response.   Pt is scheduled for surgery on 03/22/24.  Requesting a follow up call today, or requesting form be faxed back for clearance as soon as possible.  Ph. [phone] Fax  [phone]  Caller is aware of same day call back.

## 2024-03-10 NOTE — Telephone Encounter (Signed)
 Appointment scheduled for 12/2, surgery on 12/3. Paperwork in Dr Everlene in box

## 2024-03-13 ENCOUNTER — Other Ambulatory Visit

## 2024-03-13 DIAGNOSIS — E119 Type 2 diabetes mellitus without complications: Secondary | ICD-10-CM | POA: Diagnosis not present

## 2024-03-13 DIAGNOSIS — J439 Emphysema, unspecified: Secondary | ICD-10-CM | POA: Diagnosis not present

## 2024-03-13 DIAGNOSIS — E669 Obesity, unspecified: Secondary | ICD-10-CM | POA: Diagnosis not present

## 2024-03-13 DIAGNOSIS — I10 Essential (primary) hypertension: Secondary | ICD-10-CM | POA: Diagnosis not present

## 2024-03-14 LAB — HEMOGLOBIN A1C
Hgb A1c MFr Bld: 6.4 % — ABNORMAL HIGH (ref <5.7)
Mean Plasma Glucose: 137 mg/dL
eAG (mmol/L): 7.6 mmol/L

## 2024-03-14 LAB — CBC WITH DIFFERENTIAL/PLATELET
Absolute Lymphocytes: 1955 {cells}/uL (ref 850–3900)
Absolute Monocytes: 667 {cells}/uL (ref 200–950)
Basophils Absolute: 38 {cells}/uL (ref 0–200)
Basophils Relative: 0.4 %
Eosinophils Absolute: 141 {cells}/uL (ref 15–500)
Eosinophils Relative: 1.5 %
HCT: 43.3 % (ref 35.9–46.0)
Hemoglobin: 13 g/dL (ref 11.7–15.5)
MCH: 27.8 pg (ref 27.0–33.0)
MCHC: 30 g/dL — ABNORMAL LOW (ref 31.6–35.4)
MCV: 92.5 fL (ref 81.4–101.7)
MPV: 10.2 fL (ref 7.5–12.5)
Monocytes Relative: 7.1 %
Neutro Abs: 6599 {cells}/uL (ref 1500–7800)
Neutrophils Relative %: 70.2 %
Platelets: 323 Thousand/uL (ref 140–400)
RBC: 4.68 Million/uL (ref 3.80–5.10)
RDW: 12.5 % (ref 11.0–15.0)
Total Lymphocyte: 20.8 %
WBC: 9.4 Thousand/uL (ref 3.8–10.8)

## 2024-03-14 LAB — COMPREHENSIVE METABOLIC PANEL WITH GFR
AG Ratio: 1.1 (calc) (ref 1.0–2.5)
ALT: 4 U/L — ABNORMAL LOW (ref 6–29)
AST: 11 U/L (ref 10–35)
Albumin: 3.8 g/dL (ref 3.6–5.1)
Alkaline phosphatase (APISO): 83 U/L (ref 37–153)
BUN/Creatinine Ratio: 11 (calc) (ref 6–22)
BUN: 12 mg/dL (ref 7–25)
CO2: 36 mmol/L — ABNORMAL HIGH (ref 20–32)
Calcium: 9.1 mg/dL (ref 8.6–10.4)
Chloride: 99 mmol/L (ref 98–110)
Creat: 1.11 mg/dL — ABNORMAL HIGH (ref 0.60–1.00)
Globulin: 3.4 g/dL (ref 1.9–3.7)
Glucose, Bld: 167 mg/dL — ABNORMAL HIGH (ref 65–99)
Potassium: 4.5 mmol/L (ref 3.5–5.3)
Sodium: 142 mmol/L (ref 135–146)
Total Bilirubin: 0.4 mg/dL (ref 0.2–1.2)
Total Protein: 7.2 g/dL (ref 6.1–8.1)
eGFR: 51 mL/min/1.73m2 — ABNORMAL LOW (ref >=60)

## 2024-03-14 LAB — LIPID PANEL
Cholesterol: 136 mg/dL (ref <200)
HDL: 62 mg/dL (ref >=50)
LDL Cholesterol (Calc): 52 mg/dL
Non-HDL Cholesterol (Calc): 74 mg/dL (ref <130)
Total CHOL/HDL Ratio: 2.2 (calc) (ref <5.0)
Triglycerides: 135 mg/dL (ref <150)

## 2024-03-15 ENCOUNTER — Other Ambulatory Visit: Payer: Self-pay | Admitting: Internal Medicine

## 2024-03-15 ENCOUNTER — Telehealth: Payer: Self-pay

## 2024-03-15 DIAGNOSIS — E119 Type 2 diabetes mellitus without complications: Secondary | ICD-10-CM

## 2024-03-15 NOTE — Telephone Encounter (Signed)
 Copied from CRM [id number]. Topic: Clinical - Prescription Issue >> Mar 15, 2024 12:07 PM Anairis L wrote: Reason for CRM: [facility] pharmacist from [facility], is calling in because she want to make provider aware patient was taking enalapril  (VASOTEC ) 20 MG tablet and has no more refill. Should patient continue medication.   [phone] ext [phone]   She is also requesting empagliflozin  (JARDIANCE ) 10 MG TABS tablet refill.   [facility] [address], [place] - [address] [place] [address] Phone: [phone] Fax: [phone] Hours: Not open 24 hours

## 2024-03-18 ENCOUNTER — Other Ambulatory Visit: Payer: Self-pay

## 2024-03-18 DIAGNOSIS — I10 Essential (primary) hypertension: Secondary | ICD-10-CM

## 2024-03-18 MED ORDER — ENALAPRIL MALEATE 20 MG PO TABS
20.0000 mg | ORAL_TABLET | Freq: Two times a day (BID) | ORAL | 3 refills | Status: AC
Start: 2024-03-18 — End: ?

## 2024-03-18 MED ORDER — EMPAGLIFLOZIN 10 MG PO TABS: 10.0000 mg | ORAL_TABLET | Freq: Every day | ORAL | 3 refills | Status: AC

## 2024-03-20 ENCOUNTER — Other Ambulatory Visit: Payer: Self-pay

## 2024-03-20 NOTE — Telephone Encounter (Signed)
 Duplicate request, refilled 03/18/24.  Requested Prescriptions  Pending Prescriptions Disp Refills   JARDIANCE  10 MG TABS tablet [Pharmacy Med Name: Jardiance  10 MG Oral Tablet] 90 tablet 0    Sig: TAKE 1 TABLET BY MOUTH ONCE DAILY BEFORE BREAKFAST     Endocrinology:  Diabetes - SGLT2 Inhibitors Failed - 03/20/2024  3:49 PM      Failed - Cr in normal range and within 360 days    Creat  Date Value Ref Range Status  03/13/2024 1.11 (H) 0.60 - 1.00 mg/dL Final   Creatinine, Urine  Date Value Ref Range Status  05/20/2023 273 20 - 275 mg/dL Final         Failed - eGFR in normal range and within 360 days    GFR, Est African American  Date Value Ref Range Status  04/15/2020 55 (L) > OR = 60 mL/min/1.56m2 Final   GFR, Est Non African American  Date Value Ref Range Status  04/15/2020 47 (L) > OR = 60 mL/min/1.21m2 Final   GFR, Estimated  Date Value Ref Range Status  11/29/2021 42 (L) >60 mL/min Final    Comment:    (NOTE) Calculated using the CKD-EPI Creatinine Equation (2021)    eGFR  Date Value Ref Range Status  03/13/2024 51 (L) > OR = 60 mL/min/1.75m2 Final  02/12/2021 42 (L) >59 mL/min/1.73 Final         Passed - HBA1C is between 0 and 7.9 and within 180 days    Hemoglobin A1C  Date Value Ref Range Status  01/10/2016 7.5  Final   Hgb A1c MFr Bld  Date Value Ref Range Status  03/13/2024 6.4 (H) <5.7 % Final    Comment:    For someone without known diabetes, a hemoglobin  A1c value between 5.7% and 6.4% is consistent with prediabetes and should be confirmed with a  follow-up test. . For someone with known diabetes, a value <7% indicates that their diabetes is well controlled. A1c targets should be individualized based on duration of diabetes, age, comorbid conditions, and other considerations. . This assay result is consistent with an increased risk of diabetes. . Currently, no consensus exists regarding use of hemoglobin A1c for diagnosis of diabetes for  children. SABRA Amy - Valid encounter within last 6 months    Recent Outpatient Visits           1 month ago Flu vaccine need   [place], Blunt A, MD   3 months ago Type 2 diabetes mellitus with hyperglycemia, without long-term current use of insulin  [facility])   [facility] Bernardo Fend, DO   4 months ago Type 2 diabetes mellitus without complication, without long-term current use of insulin  [facility])   [facility] Bernardo Fend, DO   4 months ago Pre-op evaluation   [facility] Bernardo Fend, DO   7 months ago Chronic pain of left knee   [facility] Bernardo Fend, [place]

## 2024-03-21 ENCOUNTER — Telehealth: Payer: Self-pay

## 2024-03-21 ENCOUNTER — Ambulatory Visit

## 2024-03-21 VITALS — BP 140/70 | HR 64 | Ht 65.0 in | Wt 213.0 lb

## 2024-03-21 DIAGNOSIS — N1832 Chronic kidney disease, stage 3b: Secondary | ICD-10-CM | POA: Diagnosis not present

## 2024-03-21 DIAGNOSIS — E1122 Type 2 diabetes mellitus with diabetic chronic kidney disease: Secondary | ICD-10-CM | POA: Diagnosis not present

## 2024-03-21 DIAGNOSIS — J439 Emphysema, unspecified: Secondary | ICD-10-CM

## 2024-03-21 DIAGNOSIS — E66813 Obesity, class 3: Secondary | ICD-10-CM | POA: Insufficient documentation

## 2024-03-21 DIAGNOSIS — J301 Allergic rhinitis due to pollen: Secondary | ICD-10-CM

## 2024-03-21 DIAGNOSIS — E669 Obesity, unspecified: Secondary | ICD-10-CM

## 2024-03-21 DIAGNOSIS — E119 Type 2 diabetes mellitus without complications: Secondary | ICD-10-CM

## 2024-03-21 MED ORDER — FLUTICASONE PROPIONATE 50 MCG/ACT NA SUSP: 2.0000 | Freq: Every day | NASAL | 6 refills | Status: AC

## 2024-03-21 NOTE — Telephone Encounter (Signed)
 Received reill reorder request from [employer] (Rybelsus ) filled and faxed to provider office to sign and date.

## 2024-03-21 NOTE — Progress Notes (Addendum)
 Progress Note  Physician: Rosslyn Pasion A Rachid Parham, MD   HPI: Julie Keith is a 78 y.o. female presenting on 03/21/2024 for Follow-up .  Discussed the use of AI scribe software for clinical note transcription with the patient, who gave verbal consent to proceed.  History of Present Illness   Julie Keith is a 78 year old female with diabetes, COPD, and mild chronic kidney disease who presents for preoperative evaluation for left knee surgery.  Preoperative evaluation for left knee surgery - Scheduled for left knee surgery tomorrow - Expresses concern regarding postoperative recovery, particularly due to advanced age - Arranged for home support postoperatively, including overnight assistance and help with her small dog - Plans to complete rehabilitation at home  Glycemic control - Diabetes is well-controlled with current medication regimen - Blood glucose levels have remained stable recently - Currently taking semaglutide  (Rybelsus ) and amaryl  for dm - Jardiance  has been discontinued prior to surgery as instructed  Chronic obstructive pulmonary disease (copd) - Uses supplemental oxygen  at night and occasionally during the day as needed  - Stable.   Chronic kidney disease - Mild chronic kidney disease with a stable GFR of 51      Medical history:  Relevant past medical, surgical, family and social history reviewed and updated as indicated. Interim medical history since our last visit reviewed.  Allergies and medications reviewed and updated.   ROS: Negative unless specifically indicated above in HPI.    Current Outpatient Medications:    acetaminophen  (TYLENOL ) 500 MG tablet, Take 1,000 mg by mouth every 8 (eight) hours as needed for moderate pain., Disp: , Rfl:    albuterol  (VENTOLIN  HFA) 108 (90 Base) MCG/ACT inhaler, Inhale 2 puffs into the lungs every 6 (six) hours as needed for wheezing or shortness of breath., Disp: 8 g, Rfl: 0   aspirin  81 MG chewable  tablet, Chew 1 tablet (81 mg total) by mouth 2 (two) times daily., Disp: 60 tablet, Rfl: 0   carvedilol  (COREG ) 6.25 MG tablet, Take 1 tablet (6.25 mg total) by mouth 2 (two) times daily., Disp: 180 tablet, Rfl: 0   empagliflozin  (JARDIANCE ) 10 MG TABS tablet, Take 1 tablet (10 mg total) by mouth daily., Disp: 90 tablet, Rfl: 3   enalapril  (VASOTEC ) 20 MG tablet, Take 1 tablet (20 mg total) by mouth 2 (two) times daily., Disp: 180 tablet, Rfl: 3   Fluticasone -Umeclidin-Vilant (TRELEGY ELLIPTA ) 100-62.5-25 MCG/ACT AEPB, Inhale 1 puff into the lungs daily., Disp: 1 each, Rfl: 11   glimepiride  (AMARYL ) 4 MG tablet, Take 1 tablet (4 mg total) by mouth 2 (two) times daily., Disp: 180 tablet, Rfl: 1   glucose blood (ONETOUCH ULTRA) test strip, USE 1 STRIP TO CHECK GLUCOSE IN THE MORNING, AT NOON, AND AT BEDTIME., Disp: 100 each, Rfl: 1   ipratropium-albuterol  (DUONEB) 0.5-2.5 (3) MG/3ML SOLN, Take 3 mLs by nebulization every 6 (six) hours as needed (cough, wheeze, shortness of breath)., Disp: 180 mL, Rfl: 1   OXYGEN , Inhale 2 L into the lungs at bedtime. Uses APRIA at night, Disp: , Rfl:    pantoprazole  (PROTONIX ) 20 MG tablet, Take 1 tablet (20 mg total) by mouth daily., Disp: 90 tablet, Rfl: 1   rosuvastatin  (CRESTOR ) 10 MG tablet, Take 1 tablet by mouth once daily, Disp: 90 tablet, Rfl: 0   hydrOXYzine (ATARAX) 25 MG tablet, Take 25 mg by mouth daily. (Patient not taking: Reported on 03/21/2024), Disp: , Rfl:  Semaglutide  (RYBELSUS ) 7 MG TABS, Take 1 tablet (7 mg total) by mouth daily., Disp: , Rfl:        Objective:     BP (!) 140/70   Pulse 64   Ht 5' 5 (1.651 m)   Wt 213 lb (96.6 kg)   SpO2 (!) 81%   BMI 35.45 kg/m   Wt Readings from Last 3 Encounters:  03/21/24 213 lb (96.6 kg)  02/11/24 212 lb 9.6 oz (96.4 kg)  01/27/24 212 lb (96.2 kg)    Physical Exam  Physical Exam Vitals reviewed.  Constitutional:      Appearance: Normal appearance. Well-developed with normal weight.   Cardiovascular:     Rate and Rhythm: Normal rate and regular rhythm. Normal heart sounds. Normal peripheral pulses Pulmonary:     Normal breath sounds with normal effort Skin:    General: Skin is warm and dry without noticeable rash. Neurological:     General: No focal deficit present.  Psychiatric:        Mood and Affect: Mood, behavior and cognition normal      Assessment & Plan:   Encounter Diagnoses  Name Primary?   Class 3 severe obesity with serious comorbidity and body mass index (BMI) of 40.0 to 44.9 in adult, unspecified obesity type (HCC) Yes   Stage 3b chronic kidney disease (CKD) (HCC)    Type 2 diabetes mellitus without complication, without long-term current use of insulin  (HCC)    Pulmonary emphysema (HCC)    Obesity (BMI 30-39.9)     No orders of the defined types were placed in this encounter.    Assessment and Plan    Preoperative evaluation for left knee arthroplasty Scheduled for left knee arthroplasty tomorrow. Higher surgical risk due to age, diabetes, and COPD. Preoperative clearance completed. Anticipated recovery challenging but expected to improve mobility. No FH anesthesia reactions or bleeding.  Chronic conditions significant but stable.  - Ensure overnight hospital stay post-surgery for monitoring. - Monitor blood pressure, labs, and pain management post-surgery. - Encourage early mobilization post-surgery. - Use pain medication as needed, with caution for side effects. - Ensure family support post-surgery.  Chronic kidney disease, stage 3b Mild chronic kidney disease with GFR of 51. - Continue to monitor kidney function post-surgery.  Type 2 diabetes mellitus Diabetes management crucial post-surgery due to infection risk. Blood sugars well-controlled. Jardiance  stopped preoperatively  - Monitor blood sugars closely post-surgery. - Discuss alternative diabetes medications post-surgery if needed.  Chronic obstructive pulmonary disease COPD  increases risk of respiratory complications post-surgery. Emphasized early mobilization and respiratory exercises. - Monitor respiratory status closely post-surgery. - Ensure adequate oxygen  supply as needed.  - Patient currently remains on Trelegy and has albuterol  as a rescue inhaler her oxygen  saturation is currently at 93% is at baseline.  We discussed using incentive spirometer postoperatively she is certainly overall higher risk due to pulmonary function issues.  Allergic rhinitis Managed with over-the-counter medications. - Prescribed nasal spray for management.

## 2024-03-21 NOTE — Telephone Encounter (Signed)
 Copied from CRM #[id number]. Topic: General - Other >> Mar 21, 2024  9:07 AM Lonell PEDLAR wrote: Reason for CRM: Almarie, calling from [facility] is calling regarding pts left total knee replacement tomorrow pending clearance from PCP, OV Note. Procedure will be cancelled if not received by noon. C/B: [phone] F: [phone]

## 2024-03-22 DIAGNOSIS — Z6835 Body mass index (BMI) 35.0-35.9, adult: Secondary | ICD-10-CM | POA: Diagnosis not present

## 2024-03-22 DIAGNOSIS — Z96652 Presence of left artificial knee joint: Secondary | ICD-10-CM | POA: Diagnosis not present

## 2024-03-22 DIAGNOSIS — Z7982 Long term (current) use of aspirin: Secondary | ICD-10-CM | POA: Diagnosis not present

## 2024-03-22 DIAGNOSIS — Z9682 Presence of neurostimulator: Secondary | ICD-10-CM | POA: Diagnosis not present

## 2024-03-22 DIAGNOSIS — I129 Hypertensive chronic kidney disease with stage 1 through stage 4 chronic kidney disease, or unspecified chronic kidney disease: Secondary | ICD-10-CM | POA: Diagnosis not present

## 2024-03-22 DIAGNOSIS — Z836 Family history of other diseases of the respiratory system: Secondary | ICD-10-CM | POA: Diagnosis not present

## 2024-03-22 DIAGNOSIS — Z9049 Acquired absence of other specified parts of digestive tract: Secondary | ICD-10-CM | POA: Diagnosis not present

## 2024-03-22 DIAGNOSIS — Z96643 Presence of artificial hip joint, bilateral: Secondary | ICD-10-CM | POA: Diagnosis not present

## 2024-03-22 DIAGNOSIS — E1122 Type 2 diabetes mellitus with diabetic chronic kidney disease: Secondary | ICD-10-CM | POA: Diagnosis not present

## 2024-03-22 DIAGNOSIS — K219 Gastro-esophageal reflux disease without esophagitis: Secondary | ICD-10-CM | POA: Diagnosis not present

## 2024-03-22 DIAGNOSIS — Z9013 Acquired absence of bilateral breasts and nipples: Secondary | ICD-10-CM | POA: Diagnosis not present

## 2024-03-22 DIAGNOSIS — E669 Obesity, unspecified: Secondary | ICD-10-CM | POA: Diagnosis not present

## 2024-03-22 DIAGNOSIS — Z87891 Personal history of nicotine dependence: Secondary | ICD-10-CM | POA: Diagnosis not present

## 2024-03-22 DIAGNOSIS — Z888 Allergy status to other drugs, medicaments and biological substances status: Secondary | ICD-10-CM | POA: Diagnosis not present

## 2024-03-22 DIAGNOSIS — J9611 Chronic respiratory failure with hypoxia: Secondary | ICD-10-CM | POA: Diagnosis not present

## 2024-03-22 DIAGNOSIS — I471 Supraventricular tachycardia, unspecified: Secondary | ICD-10-CM | POA: Diagnosis not present

## 2024-03-22 DIAGNOSIS — Z602 Problems related to living alone: Secondary | ICD-10-CM | POA: Diagnosis not present

## 2024-03-22 DIAGNOSIS — J449 Chronic obstructive pulmonary disease, unspecified: Secondary | ICD-10-CM | POA: Diagnosis not present

## 2024-03-22 DIAGNOSIS — N183 Chronic kidney disease, stage 3 unspecified: Secondary | ICD-10-CM | POA: Diagnosis not present

## 2024-03-22 DIAGNOSIS — M89352 Hypertrophy of bone, left femur: Secondary | ICD-10-CM | POA: Diagnosis not present

## 2024-03-22 DIAGNOSIS — Z79899 Other long term (current) drug therapy: Secondary | ICD-10-CM | POA: Diagnosis not present

## 2024-03-22 DIAGNOSIS — T84023A Instability of internal left knee prosthesis, initial encounter: Secondary | ICD-10-CM | POA: Diagnosis not present

## 2024-03-22 DIAGNOSIS — E785 Hyperlipidemia, unspecified: Secondary | ICD-10-CM | POA: Diagnosis not present

## 2024-03-22 DIAGNOSIS — Z90710 Acquired absence of both cervix and uterus: Secondary | ICD-10-CM | POA: Diagnosis not present

## 2024-03-22 DIAGNOSIS — Z9889 Other specified postprocedural states: Secondary | ICD-10-CM | POA: Diagnosis not present

## 2024-03-22 DIAGNOSIS — G8918 Other acute postprocedural pain: Secondary | ICD-10-CM | POA: Diagnosis not present

## 2024-03-22 DIAGNOSIS — Z7984 Long term (current) use of oral hypoglycemic drugs: Secondary | ICD-10-CM | POA: Diagnosis not present

## 2024-03-27 ENCOUNTER — Ambulatory Visit: Payer: Self-pay

## 2024-03-27 NOTE — Telephone Encounter (Signed)
 FYI Only or Action Required?: Action required by provider: update on patient condition.  Patient was last seen in primary care on 03/21/2024 by Zafirov, Clarissa A, MD.  Called Nurse Triage reporting Cough.  Symptoms began today.  Interventions attempted: Nothing.  Symptoms are: unchanged.  Triage Disposition: Home Care  Patient/caregiver understands and will follow disposition?: Unsure  Copied from CRM #[id number]. Topic: Clinical - Red Word Triage >> Mar 27, 2024  1:36 PM Winona R wrote: COPD pt Dry and sore throat, nose stopped up, cough. Feels hoarsed Reason for Disposition  Cough with cold symptoms (e.g., runny nose, postnasal drip, throat clearing)  Answer Assessment - Initial Assessment Questions Recent knee surgery on 03/22/2024  1. ONSET: When did the cough begin?      One day 2. SEVERITY: How bad is the cough today?      Moderate, dry hacking cough 3. SPUTUM: Describe the color of your sputum (e.g., none, dry cough; clear, white, yellow, green)     none 4. HEMOPTYSIS: Are you coughing up any blood? If Yes, ask: How much? (e.g., flecks, streaks, tablespoons, etc.)     denies 5. DIFFICULTY BREATHING: Are you having difficulty breathing? If Yes, ask: How bad is it? (e.g., mild, moderate, severe)      Denies SOB.  Wearing oxygen  at 2L.  States that she does not feel out of breath, but throat feels scratchy 6. FEVER: Do you have a fever? If Yes, ask: What is your temperature, how was it measured, and when did it start?     denies 7. CARDIAC HISTORY: Do you have any history of heart disease? (e.g., heart attack, congestive heart failure)      HTN 8. LUNG HISTORY: Do you have any history of lung disease?  (e.g., pulmonary embolus, asthma, emphysema)     COPD, emphysema 9. PE RISK FACTORS: Do you have a history of blood clots? (or: recent major surgery, recent prolonged travel, bedridden)     Recent knee replacement revision surgery 10. OTHER SYMPTOMS:  Do you have any other symptoms? (e.g., runny nose, wheezing, chest pain)       States that she has an allergy like runny nose  Protocols used: Cough - Acute Non-Productive-A-AH

## 2024-03-28 ENCOUNTER — Ambulatory Visit

## 2024-04-21 ENCOUNTER — Encounter
